# Patient Record
Sex: Male | Born: 1940
Health system: Southern US, Community
[De-identification: ages and names within clinical notes are randomized; demographics above are authoritative.]

## PROBLEM LIST (undated history)

## (undated) DIAGNOSIS — R51 Headache: Secondary | ICD-10-CM

## (undated) DIAGNOSIS — U071 COVID-19: Secondary | ICD-10-CM

## (undated) DIAGNOSIS — I499 Cardiac arrhythmia, unspecified: Secondary | ICD-10-CM

## (undated) DIAGNOSIS — I5030 Unspecified diastolic (congestive) heart failure: Secondary | ICD-10-CM

## (undated) DIAGNOSIS — F0390 Unspecified dementia without behavioral disturbance: Secondary | ICD-10-CM

## (undated) DIAGNOSIS — I219 Acute myocardial infarction, unspecified: Secondary | ICD-10-CM

## (undated) DIAGNOSIS — M51369 Other intervertebral disc degeneration, lumbar region without mention of lumbar back pain or lower extremity pain: Secondary | ICD-10-CM

## (undated) DIAGNOSIS — M1990 Unspecified osteoarthritis, unspecified site: Secondary | ICD-10-CM

## (undated) DIAGNOSIS — L219 Seborrheic dermatitis, unspecified: Secondary | ICD-10-CM

## (undated) DIAGNOSIS — K219 Gastro-esophageal reflux disease without esophagitis: Secondary | ICD-10-CM

## (undated) DIAGNOSIS — I480 Paroxysmal atrial fibrillation: Secondary | ICD-10-CM

## (undated) DIAGNOSIS — I4891 Unspecified atrial fibrillation: Secondary | ICD-10-CM

## (undated) DIAGNOSIS — D509 Iron deficiency anemia, unspecified: Secondary | ICD-10-CM

## (undated) DIAGNOSIS — IMO0001 Reserved for inherently not codable concepts without codable children: Secondary | ICD-10-CM

## (undated) DIAGNOSIS — C61 Malignant neoplasm of prostate: Secondary | ICD-10-CM

## (undated) DIAGNOSIS — E785 Hyperlipidemia, unspecified: Secondary | ICD-10-CM

## (undated) DIAGNOSIS — Z87442 Personal history of urinary calculi: Secondary | ICD-10-CM

## (undated) DIAGNOSIS — I700 Atherosclerosis of aorta: Secondary | ICD-10-CM

## (undated) DIAGNOSIS — M5136 Other intervertebral disc degeneration, lumbar region: Secondary | ICD-10-CM

## (undated) DIAGNOSIS — L570 Actinic keratosis: Secondary | ICD-10-CM

## (undated) DIAGNOSIS — I2510 Atherosclerotic heart disease of native coronary artery without angina pectoris: Secondary | ICD-10-CM

## (undated) DIAGNOSIS — D1779 Benign lipomatous neoplasm of other sites: Secondary | ICD-10-CM

## (undated) DIAGNOSIS — K5790 Diverticulosis of intestine, part unspecified, without perforation or abscess without bleeding: Secondary | ICD-10-CM

## (undated) DIAGNOSIS — R519 Headache, unspecified: Secondary | ICD-10-CM

## (undated) DIAGNOSIS — I10 Essential (primary) hypertension: Secondary | ICD-10-CM

## (undated) DIAGNOSIS — Z8719 Personal history of other diseases of the digestive system: Secondary | ICD-10-CM

## (undated) DIAGNOSIS — M4306 Spondylolysis, lumbar region: Secondary | ICD-10-CM

## (undated) HISTORY — PX: JOINT REPLACEMENT: SHX530

## (undated) HISTORY — DX: Unspecified osteoarthritis, unspecified site: M19.90

## (undated) HISTORY — DX: Actinic keratosis: L57.0

## (undated) HISTORY — PX: OTHER SURGICAL HISTORY: SHX169

## (undated) HISTORY — PX: CARDIAC CATHETERIZATION: SHX172

## (undated) HISTORY — DX: Seborrheic dermatitis, unspecified: L21.9

---

## 2006-02-02 ENCOUNTER — Ambulatory Visit: Admission: RE | Admit: 2006-02-02 | Discharge: 2006-03-24 | Payer: Self-pay | Admitting: Radiation Oncology

## 2006-02-08 ENCOUNTER — Ambulatory Visit (HOSPITAL_COMMUNITY): Admission: RE | Admit: 2006-02-08 | Discharge: 2006-02-08 | Payer: Self-pay | Admitting: Urology

## 2006-05-05 ENCOUNTER — Ambulatory Visit: Admission: RE | Admit: 2006-05-05 | Discharge: 2006-08-03 | Payer: Self-pay | Admitting: Radiation Oncology

## 2006-08-26 ENCOUNTER — Ambulatory Visit: Payer: Self-pay | Admitting: Unknown Physician Specialty

## 2007-01-11 ENCOUNTER — Other Ambulatory Visit: Payer: Self-pay

## 2007-01-11 ENCOUNTER — Ambulatory Visit: Payer: Self-pay | Admitting: Urology

## 2007-01-25 ENCOUNTER — Ambulatory Visit: Payer: Self-pay | Admitting: Urology

## 2007-02-15 ENCOUNTER — Ambulatory Visit: Payer: Self-pay | Admitting: Urology

## 2009-07-03 DIAGNOSIS — I214 Non-ST elevation (NSTEMI) myocardial infarction: Secondary | ICD-10-CM

## 2009-07-03 HISTORY — DX: Non-ST elevation (NSTEMI) myocardial infarction: I21.4

## 2009-07-04 ENCOUNTER — Inpatient Hospital Stay: Payer: Self-pay | Admitting: Internal Medicine

## 2010-07-21 ENCOUNTER — Encounter
Admission: RE | Admit: 2010-07-21 | Discharge: 2010-07-21 | Payer: Self-pay | Source: Home / Self Care | Attending: Internal Medicine | Admitting: Internal Medicine

## 2010-08-06 ENCOUNTER — Encounter
Admission: RE | Admit: 2010-08-06 | Discharge: 2010-08-06 | Payer: Self-pay | Source: Home / Self Care | Attending: Internal Medicine | Admitting: Internal Medicine

## 2011-07-16 ENCOUNTER — Other Ambulatory Visit: Payer: Self-pay | Admitting: Internal Medicine

## 2011-07-16 DIAGNOSIS — N281 Cyst of kidney, acquired: Secondary | ICD-10-CM

## 2011-07-20 ENCOUNTER — Ambulatory Visit
Admission: RE | Admit: 2011-07-20 | Discharge: 2011-07-20 | Disposition: A | Discharge status: Home / Self Care | Payer: Medicare Other | Source: Ambulatory Visit | Attending: Internal Medicine | Admitting: Internal Medicine

## 2011-07-20 DIAGNOSIS — N281 Cyst of kidney, acquired: Secondary | ICD-10-CM

## 2011-12-22 ENCOUNTER — Ambulatory Visit: Payer: Self-pay | Admitting: Urology

## 2011-12-24 ENCOUNTER — Ambulatory Visit: Payer: Self-pay | Admitting: Urology

## 2012-03-30 DIAGNOSIS — C61 Malignant neoplasm of prostate: Secondary | ICD-10-CM | POA: Insufficient documentation

## 2012-03-30 DIAGNOSIS — N529 Male erectile dysfunction, unspecified: Secondary | ICD-10-CM | POA: Insufficient documentation

## 2012-03-30 DIAGNOSIS — N200 Calculus of kidney: Secondary | ICD-10-CM | POA: Insufficient documentation

## 2012-03-30 DIAGNOSIS — K4090 Unilateral inguinal hernia, without obstruction or gangrene, not specified as recurrent: Secondary | ICD-10-CM | POA: Insufficient documentation

## 2012-03-30 DIAGNOSIS — R339 Retention of urine, unspecified: Secondary | ICD-10-CM | POA: Insufficient documentation

## 2012-08-17 DIAGNOSIS — N393 Stress incontinence (female) (male): Secondary | ICD-10-CM | POA: Insufficient documentation

## 2012-12-24 ENCOUNTER — Emergency Department: Payer: Self-pay | Admitting: Emergency Medicine

## 2015-03-08 ENCOUNTER — Encounter (HOSPITAL_COMMUNITY): Admission: EM | Disposition: A | Discharge status: Home / Self Care | Payer: Self-pay | Source: Home / Self Care

## 2015-03-08 ENCOUNTER — Inpatient Hospital Stay (HOSPITAL_COMMUNITY)
Admission: EM | Admit: 2015-03-08 | Discharge: 2015-03-14 | DRG: 354 | Disposition: A | Discharge status: Home / Self Care | Payer: PPO | Attending: Surgery | Admitting: Surgery

## 2015-03-08 ENCOUNTER — Inpatient Hospital Stay (HOSPITAL_COMMUNITY): Payer: PPO | Admitting: Anesthesiology

## 2015-03-08 ENCOUNTER — Encounter (HOSPITAL_COMMUNITY): Payer: Self-pay | Admitting: Emergency Medicine

## 2015-03-08 ENCOUNTER — Emergency Department (HOSPITAL_COMMUNITY): Payer: PPO

## 2015-03-08 DIAGNOSIS — I248 Other forms of acute ischemic heart disease: Secondary | ICD-10-CM | POA: Diagnosis not present

## 2015-03-08 DIAGNOSIS — Z7901 Long term (current) use of anticoagulants: Secondary | ICD-10-CM | POA: Insufficient documentation

## 2015-03-08 DIAGNOSIS — I10 Essential (primary) hypertension: Secondary | ICD-10-CM | POA: Diagnosis present

## 2015-03-08 DIAGNOSIS — I480 Paroxysmal atrial fibrillation: Secondary | ICD-10-CM | POA: Insufficient documentation

## 2015-03-08 DIAGNOSIS — I4891 Unspecified atrial fibrillation: Secondary | ICD-10-CM | POA: Diagnosis not present

## 2015-03-08 DIAGNOSIS — I2510 Atherosclerotic heart disease of native coronary artery without angina pectoris: Secondary | ICD-10-CM | POA: Insufficient documentation

## 2015-03-08 DIAGNOSIS — R7989 Other specified abnormal findings of blood chemistry: Secondary | ICD-10-CM | POA: Insufficient documentation

## 2015-03-08 DIAGNOSIS — K91840 Postprocedural hemorrhage and hematoma of a digestive system organ or structure following a digestive system procedure: Secondary | ICD-10-CM | POA: Diagnosis not present

## 2015-03-08 DIAGNOSIS — Z7902 Long term (current) use of antithrombotics/antiplatelets: Secondary | ICD-10-CM | POA: Diagnosis not present

## 2015-03-08 DIAGNOSIS — K429 Umbilical hernia without obstruction or gangrene: Secondary | ICD-10-CM | POA: Diagnosis present

## 2015-03-08 DIAGNOSIS — I340 Nonrheumatic mitral (valve) insufficiency: Secondary | ICD-10-CM | POA: Diagnosis not present

## 2015-03-08 DIAGNOSIS — Y838 Other surgical procedures as the cause of abnormal reaction of the patient, or of later complication, without mention of misadventure at the time of the procedure: Secondary | ICD-10-CM | POA: Diagnosis not present

## 2015-03-08 DIAGNOSIS — K56609 Unspecified intestinal obstruction, unspecified as to partial versus complete obstruction: Secondary | ICD-10-CM | POA: Diagnosis present

## 2015-03-08 DIAGNOSIS — K436 Other and unspecified ventral hernia with obstruction, without gangrene: Secondary | ICD-10-CM | POA: Diagnosis present

## 2015-03-08 DIAGNOSIS — Z955 Presence of coronary angioplasty implant and graft: Secondary | ICD-10-CM | POA: Diagnosis not present

## 2015-03-08 DIAGNOSIS — R1110 Vomiting, unspecified: Secondary | ICD-10-CM | POA: Diagnosis present

## 2015-03-08 DIAGNOSIS — R778 Other specified abnormalities of plasma proteins: Secondary | ICD-10-CM | POA: Insufficient documentation

## 2015-03-08 HISTORY — PX: LYSIS OF ADHESION: SHX5961

## 2015-03-08 HISTORY — PX: OMENTECTOMY: SHX5985

## 2015-03-08 HISTORY — PX: UMBILICAL HERNIA REPAIR: SHX196

## 2015-03-08 HISTORY — PX: LAPAROTOMY: SHX154

## 2015-03-08 HISTORY — PX: VENTRAL HERNIA REPAIR: SHX424

## 2015-03-08 HISTORY — DX: Essential (primary) hypertension: I10

## 2015-03-08 LAB — CBC WITH DIFFERENTIAL/PLATELET
Basophils Absolute: 0 10*3/uL (ref 0.0–0.1)
Basophils Relative: 0 % (ref 0–1)
Eosinophils Absolute: 0 10*3/uL (ref 0.0–0.7)
Eosinophils Relative: 0 % (ref 0–5)
HCT: 46.1 % (ref 39.0–52.0)
Hemoglobin: 15.8 g/dL (ref 13.0–17.0)
Lymphocytes Relative: 7 % — ABNORMAL LOW (ref 12–46)
Lymphs Abs: 0.7 10*3/uL (ref 0.7–4.0)
MCH: 30.7 pg (ref 26.0–34.0)
MCHC: 34.3 g/dL (ref 30.0–36.0)
MCV: 89.7 fL (ref 78.0–100.0)
Monocytes Absolute: 0.6 10*3/uL (ref 0.1–1.0)
Monocytes Relative: 5 % (ref 3–12)
Neutro Abs: 9.7 10*3/uL — ABNORMAL HIGH (ref 1.7–7.7)
Neutrophils Relative %: 88 % — ABNORMAL HIGH (ref 43–77)
Platelets: 199 10*3/uL (ref 150–400)
RBC: 5.14 MIL/uL (ref 4.22–5.81)
RDW: 13.6 % (ref 11.5–15.5)
WBC: 11.1 10*3/uL — ABNORMAL HIGH (ref 4.0–10.5)

## 2015-03-08 LAB — URINALYSIS, ROUTINE W REFLEX MICROSCOPIC
Bilirubin Urine: NEGATIVE
Glucose, UA: NEGATIVE mg/dL
Ketones, ur: NEGATIVE mg/dL
Leukocytes, UA: NEGATIVE
Nitrite: NEGATIVE
Protein, ur: NEGATIVE mg/dL
Specific Gravity, Urine: 1.014 (ref 1.005–1.030)
Urobilinogen, UA: 1 mg/dL (ref 0.0–1.0)
pH: 7 (ref 5.0–8.0)

## 2015-03-08 LAB — COMPREHENSIVE METABOLIC PANEL
ALT: 24 U/L (ref 17–63)
AST: 25 U/L (ref 15–41)
Albumin: 4 g/dL (ref 3.5–5.0)
Alkaline Phosphatase: 56 U/L (ref 38–126)
Anion gap: 14 (ref 5–15)
BUN: 12 mg/dL (ref 6–20)
CO2: 29 mmol/L (ref 22–32)
Calcium: 10.1 mg/dL (ref 8.9–10.3)
Chloride: 98 mmol/L — ABNORMAL LOW (ref 101–111)
Creatinine, Ser: 1.38 mg/dL — ABNORMAL HIGH (ref 0.61–1.24)
GFR calc Af Amer: 57 mL/min — ABNORMAL LOW (ref >60)
GFR calc non Af Amer: 50 mL/min — ABNORMAL LOW (ref >60)
Glucose, Bld: 157 mg/dL — ABNORMAL HIGH (ref 65–99)
Potassium: 3.4 mmol/L — ABNORMAL LOW (ref 3.5–5.1)
Sodium: 141 mmol/L (ref 135–145)
Total Bilirubin: 0.8 mg/dL (ref 0.3–1.2)
Total Protein: 7.3 g/dL (ref 6.5–8.1)

## 2015-03-08 LAB — URINE MICROSCOPIC-ADD ON

## 2015-03-08 LAB — TYPE AND SCREEN
ABO/RH(D): B POS
Antibody Screen: NEGATIVE

## 2015-03-08 LAB — PROTIME-INR
INR: 1.2 (ref 0.00–1.49)
Prothrombin Time: 15.4 seconds — ABNORMAL HIGH (ref 11.6–15.2)

## 2015-03-08 LAB — POC OCCULT BLOOD, ED: Fecal Occult Bld: NEGATIVE

## 2015-03-08 LAB — LIPASE, BLOOD: Lipase: 34 U/L (ref 22–51)

## 2015-03-08 LAB — ABO/RH: ABO/RH(D): B POS

## 2015-03-08 LAB — APTT: aPTT: 25 seconds (ref 24–37)

## 2015-03-08 LAB — I-STAT CG4 LACTIC ACID, ED: Lactic Acid, Venous: 1.47 mmol/L (ref 0.5–2.0)

## 2015-03-08 SURGERY — LAPAROTOMY, EXPLORATORY
Anesthesia: General | Site: Abdomen

## 2015-03-08 MED ORDER — SODIUM CHLORIDE 0.9 % IV SOLN
Freq: Once | INTRAVENOUS | Status: AC
  Administered 2015-03-08: 19:00:00 via INTRAVENOUS

## 2015-03-08 MED ORDER — FENTANYL CITRATE (PF) 250 MCG/5ML IJ SOLN
INTRAMUSCULAR | Status: AC
  Filled 2015-03-08: qty 5

## 2015-03-08 MED ORDER — ONDANSETRON HCL 4 MG/2ML IJ SOLN
4.0000 mg | Freq: Four times a day (QID) | INTRAMUSCULAR | Status: DC | PRN
  Administered 2015-03-08: 4 mg via INTRAVENOUS

## 2015-03-08 MED ORDER — FENTANYL CITRATE (PF) 100 MCG/2ML IJ SOLN
INTRAMUSCULAR | Status: DC | PRN
  Administered 2015-03-08: 50 ug via INTRAVENOUS
  Administered 2015-03-08: 100 ug via INTRAVENOUS
  Administered 2015-03-08 (×2): 50 ug via INTRAVENOUS

## 2015-03-08 MED ORDER — NOREPINEPHRINE BITARTRATE 1 MG/ML IV SOLN: 4000.0000 ug | INTRAVENOUS | Status: DC | PRN

## 2015-03-08 MED ORDER — PROMETHAZINE HCL 25 MG/ML IJ SOLN: 6.2500 mg | INTRAMUSCULAR | Status: DC | PRN

## 2015-03-08 MED ORDER — SODIUM CHLORIDE 0.9 % IV SOLN
Freq: Once | INTRAVENOUS | Status: AC
  Administered 2015-03-08: 20:00:00 via INTRAVENOUS

## 2015-03-08 MED ORDER — EPHEDRINE SULFATE 50 MG/ML IJ SOLN
INTRAMUSCULAR | Status: AC
Start: 2015-03-08 — End: 2015-03-08
  Filled 2015-03-08: qty 1

## 2015-03-08 MED ORDER — ONDANSETRON 4 MG PO TBDP
4.0000 mg | ORAL_TABLET | Freq: Four times a day (QID) | ORAL | Status: DC | PRN
  Filled 2015-03-08: qty 1

## 2015-03-08 MED ORDER — ONDANSETRON HCL 4 MG/2ML IJ SOLN
INTRAMUSCULAR | Status: AC
  Filled 2015-03-08: qty 2

## 2015-03-08 MED ORDER — LACTATED RINGERS IV SOLN
INTRAVENOUS | Status: DC | PRN
  Administered 2015-03-08 (×3): via INTRAVENOUS

## 2015-03-08 MED ORDER — KCL IN DEXTROSE-NACL 20-5-0.9 MEQ/L-%-% IV SOLN
INTRAVENOUS | Status: DC
  Administered 2015-03-08 – 2015-03-14 (×9): via INTRAVENOUS
  Filled 2015-03-08 (×11): qty 1000

## 2015-03-08 MED ORDER — PANTOPRAZOLE SODIUM 40 MG IV SOLR
40.0000 mg | Freq: Every day | INTRAVENOUS | Status: DC
  Administered 2015-03-09 – 2015-03-13 (×5): 40 mg via INTRAVENOUS
  Filled 2015-03-08 (×7): qty 40

## 2015-03-08 MED ORDER — PROPOFOL 10 MG/ML IV BOLUS
INTRAVENOUS | Status: DC | PRN
  Administered 2015-03-08: 150 mg via INTRAVENOUS
  Administered 2015-03-08: 50 mg via INTRAVENOUS

## 2015-03-08 MED ORDER — LIDOCAINE HCL (CARDIAC) 20 MG/ML IV SOLN
INTRAVENOUS | Status: DC | PRN
  Administered 2015-03-08: 100 mg via INTRAVENOUS

## 2015-03-08 MED ORDER — HYDRALAZINE HCL 20 MG/ML IJ SOLN: 10.0000 mg | INTRAMUSCULAR | Status: DC | PRN

## 2015-03-08 MED ORDER — 0.9 % SODIUM CHLORIDE (POUR BTL) OPTIME
TOPICAL | Status: DC | PRN
  Administered 2015-03-08 (×2): 1000 mL

## 2015-03-08 MED ORDER — PHENYLEPHRINE HCL 10 MG/ML IJ SOLN
10.0000 mg | INTRAMUSCULAR | Status: DC | PRN
  Administered 2015-03-08: 25 ug/min via INTRAVENOUS

## 2015-03-08 MED ORDER — GLYCOPYRROLATE 0.2 MG/ML IJ SOLN
INTRAMUSCULAR | Status: AC
  Filled 2015-03-08: qty 2

## 2015-03-08 MED ORDER — HYDROMORPHONE HCL 1 MG/ML IJ SOLN: 0.2500 mg | INTRAMUSCULAR | Status: DC | PRN

## 2015-03-08 MED ORDER — ROCURONIUM BROMIDE 100 MG/10ML IV SOLN
INTRAVENOUS | Status: DC | PRN
  Administered 2015-03-08: 10 mg via INTRAVENOUS
  Administered 2015-03-08: 40 mg via INTRAVENOUS

## 2015-03-08 MED ORDER — ONDANSETRON HCL 4 MG/2ML IJ SOLN
4.0000 mg | Freq: Once | INTRAMUSCULAR | Status: DC
Start: 2015-03-08 — End: 2015-03-14
  Filled 2015-03-08: qty 2

## 2015-03-08 MED ORDER — SUCCINYLCHOLINE CHLORIDE 200 MG/10ML IV SOSY
PREFILLED_SYRINGE | INTRAVENOUS | Status: DC | PRN
  Administered 2015-03-08: 100 mg via INTRAVENOUS

## 2015-03-08 MED ORDER — NEOSTIGMINE METHYLSULFATE 10 MG/10ML IV SOLN
INTRAVENOUS | Status: AC
  Filled 2015-03-08: qty 1

## 2015-03-08 MED ORDER — NEOSTIGMINE METHYLSULFATE 10 MG/10ML IV SOLN
INTRAVENOUS | Status: DC | PRN
  Administered 2015-03-08: 3 mg via INTRAVENOUS

## 2015-03-08 MED ORDER — SUCCINYLCHOLINE CHLORIDE 20 MG/ML IJ SOLN
INTRAMUSCULAR | Status: AC
  Filled 2015-03-08: qty 1

## 2015-03-08 MED ORDER — MORPHINE SULFATE 2 MG/ML IJ SOLN
1.0000 mg | INTRAMUSCULAR | Status: DC | PRN
  Administered 2015-03-08 – 2015-03-09 (×2): 2 mg via INTRAVENOUS
  Administered 2015-03-09: 4 mg via INTRAVENOUS
  Administered 2015-03-09 – 2015-03-10 (×4): 2 mg via INTRAVENOUS
  Filled 2015-03-08 (×3): qty 1
  Filled 2015-03-08: qty 2
  Filled 2015-03-08 (×3): qty 1

## 2015-03-08 MED ORDER — ROCURONIUM BROMIDE 50 MG/5ML IV SOLN
INTRAVENOUS | Status: AC
  Filled 2015-03-08: qty 1

## 2015-03-08 MED ORDER — GLYCOPYRROLATE 0.2 MG/ML IJ SOLN
INTRAMUSCULAR | Status: DC | PRN
  Administered 2015-03-08: 0.4 mg via INTRAVENOUS

## 2015-03-08 MED ORDER — CEFAZOLIN SODIUM-DEXTROSE 2-3 GM-% IV SOLR
2.0000 g | Freq: Once | INTRAVENOUS | Status: AC
  Administered 2015-03-08: 2 g via INTRAVENOUS
  Filled 2015-03-08: qty 50

## 2015-03-08 SURGICAL SUPPLY — 54 items
BIOPATCH WHT 1IN DISK W/4.0 H (GAUZE/BANDAGES/DRESSINGS) ×4 IMPLANT
BLADE SURG ROTATE 9660 (MISCELLANEOUS) ×4 IMPLANT
CANISTER SUCTION 2500CC (MISCELLANEOUS) ×4 IMPLANT
CHLORAPREP W/TINT 26ML (MISCELLANEOUS) ×4 IMPLANT
COVER MAYO STAND STRL (DRAPES) IMPLANT
COVER SURGICAL LIGHT HANDLE (MISCELLANEOUS) ×4 IMPLANT
DRAIN CHANNEL 19F RND (DRAIN) ×4 IMPLANT
DRAPE LAPAROSCOPIC ABDOMINAL (DRAPES) ×4 IMPLANT
DRAPE PROXIMA HALF (DRAPES) ×4 IMPLANT
DRAPE UTILITY XL STRL (DRAPES) IMPLANT
DRAPE WARM FLUID 44X44 (DRAPE) ×4 IMPLANT
DRSG OPSITE POSTOP 4X10 (GAUZE/BANDAGES/DRESSINGS) IMPLANT
DRSG OPSITE POSTOP 4X8 (GAUZE/BANDAGES/DRESSINGS) ×4 IMPLANT
ELECT BLADE 6.5 EXT (BLADE) ×4 IMPLANT
ELECT CAUTERY BLADE 6.4 (BLADE) ×4 IMPLANT
ELECT REM PT RETURN 9FT ADLT (ELECTROSURGICAL)
ELECTRODE REM PT RTRN 9FT ADLT (ELECTROSURGICAL) IMPLANT
EVACUATOR SILICONE 100CC (DRAIN) ×4 IMPLANT
GLOVE BIO SURGEON STRL SZ7.5 (GLOVE) ×8 IMPLANT
GLOVE BIOGEL PI IND STRL 6.5 (GLOVE) ×4 IMPLANT
GLOVE BIOGEL PI IND STRL 7.0 (GLOVE) ×6 IMPLANT
GLOVE BIOGEL PI INDICATOR 6.5 (GLOVE) ×4
GLOVE BIOGEL PI INDICATOR 7.0 (GLOVE) ×6
GLOVE BIOGEL PI ORTHO PRO SZ7 (GLOVE) ×4
GLOVE PI ORTHO PRO STRL SZ7 (GLOVE) ×4 IMPLANT
GLOVE SURG SS PI 6.5 STRL IVOR (GLOVE) ×8 IMPLANT
GOWN STRL REUS W/ TWL LRG LVL3 (GOWN DISPOSABLE) ×6 IMPLANT
GOWN STRL REUS W/TWL LRG LVL3 (GOWN DISPOSABLE) ×6
KIT BASIN OR (CUSTOM PROCEDURE TRAY) ×4 IMPLANT
KIT ROOM TURNOVER OR (KITS) ×4 IMPLANT
LIGASURE IMPACT 36 18CM CVD LR (INSTRUMENTS) ×4 IMPLANT
NS IRRIG 1000ML POUR BTL (IV SOLUTION) ×8 IMPLANT
PACK GENERAL/GYN (CUSTOM PROCEDURE TRAY) ×4 IMPLANT
PAD ARMBOARD 7.5X6 YLW CONV (MISCELLANEOUS) ×4 IMPLANT
PENCIL BUTTON HOLSTER BLD 10FT (ELECTRODE) IMPLANT
SPECIMEN JAR LARGE (MISCELLANEOUS) IMPLANT
SPONGE GAUZE 4X4 12PLY STER LF (GAUZE/BANDAGES/DRESSINGS) ×4 IMPLANT
SPONGE LAP 18X18 X RAY DECT (DISPOSABLE) IMPLANT
STAPLER VISISTAT 35W (STAPLE) ×4 IMPLANT
SUCTION POOLE TIP (SUCTIONS) ×4 IMPLANT
SUT ETHILON 3 0 FSL (SUTURE) ×4 IMPLANT
SUT NOVA 1 T20/GS 25DT (SUTURE) ×8 IMPLANT
SUT PDS AB 1 TP1 96 (SUTURE) ×8 IMPLANT
SUT SILK 2 0 SH CR/8 (SUTURE) ×4 IMPLANT
SUT SILK 2 0 TIES 10X30 (SUTURE) ×4 IMPLANT
SUT SILK 3 0 SH CR/8 (SUTURE) ×4 IMPLANT
SUT SILK 3 0 TIES 10X30 (SUTURE) ×4 IMPLANT
SUT VIC AB 3-0 SH 18 (SUTURE) IMPLANT
TAPE CLOTH SURG 4X10 WHT LF (GAUZE/BANDAGES/DRESSINGS) ×4 IMPLANT
TOWEL OR 17X26 10 PK STRL BLUE (TOWEL DISPOSABLE) ×4 IMPLANT
TRAY FOLEY CATH 16FRSI W/METER (SET/KITS/TRAYS/PACK) ×4 IMPLANT
TUBE CONNECTING 12'X1/4 (SUCTIONS)
TUBE CONNECTING 12X1/4 (SUCTIONS) IMPLANT
YANKAUER SUCT BULB TIP NO VENT (SUCTIONS) ×4 IMPLANT

## 2015-03-08 NOTE — H&P (Signed)
Jim Dodson is an 74 y.o. male.   Chief Complaint: vomiting HPI:  The patient is a 74 year old white male who presents to the emergency department with 3 days of nausea and vomiting. For the last 3 days he has noticed a firm lump just to the right of his midline incision. He has a known ventral hernia but the lump that he is feeling is new. He denies any fevers or chills. He has been passing some flatus. He has a history of previous ventral hernia repair. He also has a history of congestive heart failure and coronary artery disease with a stent that was placed in his heart in 2010. He is on Plavix. He underwent a CT scan which does show a loop of small bowel incarcerated through a hernia of the abdominal wall which is also the site of obstruction  Past Medical History  Diagnosis Date  . Hypertension     History reviewed. No pertinent past surgical history.  History reviewed. No pertinent family history. Social History:  reports that he has never smoked. He does not have any smokeless tobacco history on file. He reports that he does not drink alcohol or use illicit drugs.  Allergies:  Allergies  Allergen Reactions  . Statins      (Not in a hospital admission)  Results for orders placed or performed during the hospital encounter of 03/08/15 (from the past 48 hour(s))  Lipase, blood     Status: None   Collection Time: 03/08/15  3:28 PM  Result Value Ref Range   Lipase 34 22 - 51 U/L  Comprehensive metabolic panel     Status: Abnormal   Collection Time: 03/08/15  3:28 PM  Result Value Ref Range   Sodium 141 135 - 145 mmol/L   Potassium 3.4 (L) 3.5 - 5.1 mmol/L   Chloride 98 (L) 101 - 111 mmol/L   CO2 29 22 - 32 mmol/L   Glucose, Bld 157 (H) 65 - 99 mg/dL   BUN 12 6 - 20 mg/dL   Creatinine, Ser 1.38 (H) 0.61 - 1.24 mg/dL   Calcium 10.1 8.9 - 10.3 mg/dL   Total Protein 7.3 6.5 - 8.1 g/dL   Albumin 4.0 3.5 - 5.0 g/dL   AST 25 15 - 41 U/L   ALT 24 17 - 63 U/L   Alkaline  Phosphatase 56 38 - 126 U/L   Total Bilirubin 0.8 0.3 - 1.2 mg/dL   GFR calc non Af Amer 50 (L) >60 mL/min   GFR calc Af Amer 57 (L) >60 mL/min    Comment: (NOTE) The eGFR has been calculated using the CKD EPI equation. This calculation has not been validated in all clinical situations. eGFR's persistently <60 mL/min signify possible Chronic Kidney Disease.    Anion gap 14 5 - 15  CBC with Differential     Status: Abnormal   Collection Time: 03/08/15  3:28 PM  Result Value Ref Range   WBC 11.1 (H) 4.0 - 10.5 K/uL   RBC 5.14 4.22 - 5.81 MIL/uL   Hemoglobin 15.8 13.0 - 17.0 g/dL   HCT 46.1 39.0 - 52.0 %   MCV 89.7 78.0 - 100.0 fL   MCH 30.7 26.0 - 34.0 pg   MCHC 34.3 30.0 - 36.0 g/dL   RDW 13.6 11.5 - 15.5 %   Platelets 199 150 - 400 K/uL   Neutrophils Relative % 88 (H) 43 - 77 %   Neutro Abs 9.7 (H) 1.7 - 7.7 K/uL  Lymphocytes Relative 7 (L) 12 - 46 %   Lymphs Abs 0.7 0.7 - 4.0 K/uL   Monocytes Relative 5 3 - 12 %   Monocytes Absolute 0.6 0.1 - 1.0 K/uL   Eosinophils Relative 0 0 - 5 %   Eosinophils Absolute 0.0 0.0 - 0.7 K/uL   Basophils Relative 0 0 - 1 %   Basophils Absolute 0.0 0.0 - 0.1 K/uL  POC occult blood, ED Provider will collect     Status: None   Collection Time: 03/08/15  5:56 PM  Result Value Ref Range   Fecal Occult Bld NEGATIVE NEGATIVE  I-Stat CG4 Lactic Acid, ED     Status: None   Collection Time: 03/08/15  6:10 PM  Result Value Ref Range   Lactic Acid, Venous 1.47 0.5 - 2.0 mmol/L   Ct Abdomen Pelvis Wo Contrast  03/08/2015   CLINICAL DATA:  Acute onset of periumbilical abdominal pain. Vomiting and nausea. Initial encounter.  EXAM: CT ABDOMEN AND PELVIS WITHOUT CONTRAST  TECHNIQUE: Multidetector CT imaging of the abdomen and pelvis was performed following the standard protocol without IV contrast.  COMPARISON:  CT of the abdomen and pelvis performed 12/22/2011, and renal ultrasound performed 07/20/2011  FINDINGS: The visualized lung bases are clear.  Scattered coronary artery calcifications are seen.  A tiny calcified granuloma is noted at the left hepatic lobe. A small vague 8 mm hypodensity within the left hepatic lobe may reflect a small cyst, grossly stable from 2013. The spleen is unremarkable in appearance. The gallbladder is within normal limits.  A 2.5 cm fat containing right adrenal adenoma is noted. The left adrenal gland is unremarkable. The pancreas is within normal limits.  Vaguely abnormal contour of the left kidney near its lower pole, with minimal adjacent calcification, appears to reflect a cyst, on correlation with prior CT from 2013. Nonspecific perinephric stranding is noted bilaterally. A few nonobstructing right renal stones are seen, measuring up to 5 mm in size. There is no evidence of hydronephrosis. No obstructing ureteral stones are identified.  Several anterior abdominal wall hernias are noted along the mid abdominal wall, just superior to the umbilicus, with a small umbilical hernia also seen. There is herniation of a short 4 cm segment of proximal ileum into a right-sided anterior abdominal wall hernia, resulting in relatively high-grade small bowel obstruction. There is dilatation of small-bowel loops to 3.8 cm in maximal diameter.  Trace free fluid is seen within the pelvis. There is decompression of all small bowel loops distal to the herniation. Residual stool is noted within the colon. Scattered diverticulosis is noted along the sigmoid colon, without definite evidence of diverticulitis.  The stomach is within normal limits. No acute vascular abnormalities are seen. Relatively diffuse calcification is noted along the abdominal aorta and its branches.  The appendix is decompressed.  There is no evidence of appendicitis.  The bladder is mildly distended and grossly unremarkable. The prostate remains normal in size, with scattered calcification. A moderate left inguinal hernia is seen, containing trace fluid. No inguinal  lymphadenopathy is seen.  No acute osseous abnormalities are identified. Facet disease is noted at the lower lumbar spine. Endplate sclerotic change is seen at T10-T11.  IMPRESSION: 1. Focal herniation of a short 4 cm segment of proximal ileum into a right-sided anterior abdominal wall hernia, resulting in relatively high-grade small bowel obstruction. Dilatation of small-bowel loops to 3.8 cm in maximal diameter, trace free fluid in the pelvis. Decompression of small bowel loops distal to  the herniation. 2. Multiple anterior abdominal wall hernias seen at the mid abdominal wall, just superior to the umbilicus, with a small umbilical hernia also seen. Moderate left inguinal hernia noted, containing trace fluid. 3. Scattered coronary artery calcifications seen. 4. Tiny hepatic cyst. Apparent left renal cyst. Few nonobstructing right renal stones measure up to 5 mm in size. 5. 2.5 cm fat containing right adrenal adenoma noted. 6. Scattered diverticulosis along the sigmoid colon, without evidence of diverticulitis. 7. Relatively diffuse calcification along the abdominal aorta and its branches. These results were called by telephone at the time of interpretation on 03/08/2015 at 6:46 pm to Dr. Ezequiel Essex, who verbally acknowledged these results.   Electronically Signed   By: Garald Balding M.D.   On: 03/08/2015 18:48    Review of Systems  Constitutional: Negative.   HENT: Negative.   Eyes: Negative.   Respiratory: Negative.   Cardiovascular: Negative.   Gastrointestinal: Positive for nausea, vomiting and abdominal pain.  Genitourinary: Negative.   Musculoskeletal: Negative.   Skin: Negative.   Neurological: Negative.   Endo/Heme/Allergies: Bruises/bleeds easily.  Psychiatric/Behavioral: Negative.     Blood pressure 169/102, pulse 91, temperature 97.9 F (36.6 C), temperature source Oral, resp. rate 20, SpO2 94 %. Physical Exam  Constitutional: He is oriented to person, place, and time. He appears  well-developed and well-nourished.  HENT:  Head: Normocephalic and atraumatic.  Eyes: Conjunctivae and EOM are normal. Pupils are equal, round, and reactive to light.  Neck: Normal range of motion. Neck supple.  Cardiovascular: Normal rate, regular rhythm and normal heart sounds.   Respiratory: Effort normal and breath sounds normal.  GI: Soft.  There is a palpable mass on right mid abdomen that does not reduce and has some mild tenderness with palpation  Musculoskeletal: Normal range of motion.  Neurological: He is alert and oriented to person, place, and time.  Skin: Skin is warm and dry.  Psychiatric: He has a normal mood and affect. His behavior is normal.     Assessment/Plan  The patient appears to have an incarcerated ventral hernia with small bowel that is causing an obstruction. Because of the risk of necrosis of this loop of small bowel I think he would benefit from having the hernia repaired. I will plan to do this tonight in the operating room. I have discussed with him in detail the risks and benefits of the operation as well as some of the technical aspects and he understands and wishes to proceed. This includes a significant risk of bleeding since he is on Plavix and he may require blood products.  TOTH III,PAUL S 03/08/2015, 7:15 PM

## 2015-03-08 NOTE — ED Notes (Signed)
Called CT.  Per MD, notified CT that pt needs to go to scan now.  Informed to skip PO contrast and IV.

## 2015-03-08 NOTE — Anesthesia Preprocedure Evaluation (Addendum)
Anesthesia Evaluation  Patient identified by MRN, date of birth, ID band Patient awake    Reviewed: Allergy & Precautions, NPO status , Patient's Chart, lab work & pertinent test results, reviewed documented beta blocker date and time   Airway Mallampati: II  TM Distance: >3 FB Neck ROM: Full    Dental  (+) Partial Lower   Pulmonary neg pulmonary ROS,  breath sounds clear to auscultation        Cardiovascular hypertension, Pt. on medications and Pt. on home beta blockers + CAD, + Cardiac Stents and +CHF Rhythm:Regular Rate:Normal  EF 55% on 2015 Echo.    Neuro/Psych negative neurological ROS     GI/Hepatic Neg liver ROS, Ventral hernia with incarcerated bowel   Endo/Other  negative endocrine ROS  Renal/GU Renal InsufficiencyRenal disease     Musculoskeletal   Abdominal   Peds  Hematology Pt on DAPT last dose of ASA and plavix today.   Anesthesia Other Findings   Reproductive/Obstetrics                            Anesthesia Physical Anesthesia Plan  ASA: III and emergent  Anesthesia Plan: General   Post-op Pain Management:    Induction: Intravenous, Rapid sequence and Cricoid pressure planned  Airway Management Planned: Oral ETT  Additional Equipment:   Intra-op Plan:   Post-operative Plan: Extubation in OR  Informed Consent: I have reviewed the patients History and Physical, chart, labs and discussed the procedure including the risks, benefits and alternatives for the proposed anesthesia with the patient or authorized representative who has indicated his/her understanding and acceptance.   Dental advisory given  Plan Discussed with: CRNA and Surgeon  Anesthesia Plan Comments:        Anesthesia Quick Evaluation

## 2015-03-08 NOTE — Op Note (Signed)
03/08/2015  10:16 PM  PATIENT:  Jim Dodson  74 y.o. male  PRE-OPERATIVE DIAGNOSIS:  Incarcerated ventral hernia  POST-OPERATIVE DIAGNOSIS:  Incarcerated ventral hernia  PROCEDURE:  Procedure(s): EXPLORATORY LAPAROTOMY (N/A)  VENTRAL HERNIA  REPAIR ADULT (N/A) LYSIS OF ADHESION (N/A) OMENTECTOMY (N/A)  Umbilical hernia repair  SURGEON:  Surgeon(s) and Role:    * Jovita Kussmaul, MD - Primary    * Mickeal Skinner, MD - Assisting  PHYSICIAN ASSISTANT:   ASSISTANTS: Dr. Kieth Brightly   ANESTHESIA:   general  EBL:  Total I/O In: 1000 [I.V.:1000] Out: 135 [Urine:85; Blood:50]  BLOOD ADMINISTERED:none  DRAINS: (1) Jackson-Pratt drain(s) with closed bulb suction in the subq   LOCAL MEDICATIONS USED:  NONE  SPECIMEN:  No Specimen  DISPOSITION OF SPECIMEN:  N/A  COUNTS:  YES  TOURNIQUET:  * No tourniquets in log *  DICTATION: .Dragon Dictation  After informed consent was obtained the patient was brought to the operating room and placed in the supine position on the operating room table. After adequate induction of general anesthesia the patient's abdomen was prepped with ChloraPrep, allowed to dry, and draped in usual sterile manner. An upper midline incision was made with a 10 blade knife. This incision was carried through the skin and subcutaneous tissue sharply with the electrocautery until the linea alba was identified. The linea alba was incised with the electrocautery and the preperitoneal space was probed bluntly until the peritoneum was opened and access was gained to the abdominal cavity. The hernias were identified and initially the hernia sacs were separated sharply from the rest of the subcutaneous tissue using the electrocautery. The fascial edge at each of the hernia sacs was opened sharply with the electrocautery and Metzenbaum scissors. The upper hernia sacs contained omental fat. The lower right hernia sac contained a loop of small bowel. The fascia at the edge  of this hernia was opened sharply with Metzenbaum scissors. The bowel was reduced. The bowel had a hemorrhagic appearance. I elected to watch the bowel for several minutes. During this time we freed the omentum from the hernia sac sharply with the electrocautery. We palpated the abdominal wall and there was also an umbilical hernia. The fascia was opened sharply with electrocautery through this hernia and the sac was removed. The hernia sacs for each of the hernias was excised sharply with the electrocautery. Once this was accomplished we identified the healthy edge of the fascia and cleaned the fascial edges of any questionable tissue. The bowel was then inspected again and it had pinked up nicely. I decided not to resect the loop of bowel. Some of the omentum did have to be resected in order to get hemostasis and this was done sharply with the LigaSure. Once this was accomplished the area appeared to be hemostatic. The subcutaneous tissue was dissected away from the anterior abdominal wall fascia in order for the fascia to mobilize enough to reach at the midline. The fascia was then closed with interrupted #1 figure-of-eight Novafil stitches. The subcutaneous tissue was irrigated with saline. A small stab incision was made in the right lower abdomen and a tonsil clamp was placed through this incision into the subcutaneous tissue. A 19 French round Blake drain was brought into the operative bed and out the stab incision. The drain was anchored to the skin with a 3-0 nylon stitch. The skin was then closed with staples and the drain was placed to bulb suction and there was a good seal. Sterile  dressings were then applied. The patient tolerated the procedure well. At the end of the case all needle sponge and instrument counts were correct. The patient was then awakened and taken to recovery in stable condition.  PLAN OF CARE: Admit to inpatient   PATIENT DISPOSITION:  PACU - hemodynamically stable.   Delay start  of Pharmacological VTE agent (>24hrs) due to surgical blood loss or risk of bleeding: no

## 2015-03-08 NOTE — Anesthesia Procedure Notes (Signed)
Procedure Name: Intubation Date/Time: 03/08/2015 8:25 PM Performed by: Manus Gunning, Lechelle Wrigley J Pre-anesthesia Checklist: Patient identified, Timeout performed, Emergency Drugs available, Suction available and Patient being monitored Patient Re-evaluated:Patient Re-evaluated prior to inductionOxygen Delivery Method: Circle system utilized Preoxygenation: Pre-oxygenation with 100% oxygen Intubation Type: IV induction Ventilation: Mask ventilation without difficulty Laryngoscope Size: Mac and 4 Grade View: Grade II Tube type: Oral Tube size: 8.0 mm Number of attempts: 1 Placement Confirmation: ETT inserted through vocal cords under direct vision and breath sounds checked- equal and bilateral Secured at: 21 cm Tube secured with: Tape Dental Injury: Teeth and Oropharynx as per pre-operative assessment

## 2015-03-08 NOTE — Transfer of Care (Signed)
Immediate Anesthesia Transfer of Care Note  Patient: Jim Dodson  Procedure(s) Performed: Procedure(s): EXPLORATORY LAPAROTOMY (N/A)  VENTRAL HERNIA  REPAIR ADULT (N/A) LYSIS OF ADHESION (N/A) OMENTECTOMY (N/A) UMBILICAL HERNIA REPAIR  ADULT  Patient Location: PACU  Anesthesia Type:General  Level of Consciousness: awake  Airway & Oxygen Therapy: Patient Spontanous Breathing and Patient connected to nasal cannula oxygen  Post-op Assessment: Report given to RN and Post -op Vital signs reviewed and stable  Post vital signs: Reviewed and stable  Last Vitals:  Filed Vitals:   03/08/15 2224  BP: 157/96  Pulse: 95  Temp: 36.8 C  Resp: 13    Complications: No apparent anesthesia complications

## 2015-03-08 NOTE — ED Provider Notes (Signed)
CSN: [id number]     Arrival date & time 03/08/15  1458 History   First MD Initiated Contact with Patient 03/08/15 1731     Chief Complaint  Patient presents with  . Abdominal Pain  . Emesis     (Consider location/radiation/quality/duration/timing/severity/associated sxs/prior Treatment) HPI Comments: Three-day history of lower abdominal pain at site of previous ventral hernia repair. Pain is constant today worse with palpation. Patient reports intermittent vomiting daily for the past 3 days. He reports emesis is "dark" in color. Denies seeing any blood. No blood in his stools. Denies any fever. Denies any chest pain or shortness of breath. Feels a bulge in his abdomen is not usually there. No testicular tenderness. No urinary symptoms. Feels abdominal cramping due to discomfort in his abdomen. Still passing gas from below. Denies fever. Reports ventral hernia repair greater than 25 years ago.  Patient is a 74 y.o. male presenting with abdominal pain and vomiting. The history is provided by the patient and a relative.  Abdominal Pain Associated symptoms: nausea and vomiting   Associated symptoms: no chest pain, no cough, no diarrhea, no dysuria, no fever, no hematuria and no shortness of breath   Emesis Associated symptoms: abdominal pain   Associated symptoms: no arthralgias, no diarrhea, no headaches and no myalgias     Past Medical History  Diagnosis Date  . Hypertension    History reviewed. No pertinent past surgical history. History reviewed. No pertinent family history. History  Substance Use Topics  . Smoking status: Never Smoker   . Smokeless tobacco: Not on file  . Alcohol Use: No    Review of Systems  Constitutional: Positive for activity change and appetite change. Negative for fever.  HENT: Negative for congestion and rhinorrhea.   Respiratory: Negative for cough, chest tightness and shortness of breath.   Cardiovascular: Negative for chest pain.  Gastrointestinal:  Positive for nausea, vomiting and abdominal pain. Negative for diarrhea and blood in stool.  Genitourinary: Negative for dysuria, urgency, hematuria and testicular pain.  Musculoskeletal: Negative for myalgias and arthralgias.  Skin: Negative for rash.  Neurological: Negative for dizziness, weakness and headaches.  A complete 10 system review of systems was obtained and all systems are negative except as noted in the HPI and PMH.      Allergies  Statins  Home Medications   Prior to Admission medications   Medication Sig Start Date End Date Taking? Authorizing Provider  aspirin EC 81 MG tablet Take 81 mg by mouth daily at 12 noon.   Yes Historical Provider, MD  carvedilol (COREG) 12.5 MG tablet Take 12.5 mg by mouth 2 (two) times daily. 07/30/14  Yes Historical Provider, MD  clopidogrel (PLAVIX) 75 MG tablet Take 75 mg by mouth daily at 12 noon. 04/12/14  Yes Historical Provider, MD  Hyaluronic Acid-Vitamin C (HYALURONIC ACID PO) Take 100 mg by mouth daily at 12 noon.   Yes Historical Provider, MD  lisinopril-hydrochlorothiazide (PRINZIDE,ZESTORETIC) 20-25 MG per tablet Take 1 tablet by mouth daily at 12 noon. 12/26/13  Yes Historical Provider, MD  MAGNESIUM-POTASSIUM PO Take 1 tablet by mouth daily at 12 noon.   Yes Historical Provider, MD  meclizine (ANTIVERT) 12.5 MG tablet Take 12.5 mg by mouth 3 (three) times daily as needed for dizziness.   Yes Historical Provider, MD  Nutritional Supplements (NUTRITIONAL SUPPLEMENT PO) Take 6 tablets by mouth 2 (two) times daily. [place]: 40% ketosterones/ 20% 3-ketosterone   Yes Historical Provider, MD  Red Yeast Rice Extract 600 MG  CAPS Take 1,200 mg by mouth 2 (two) times daily.   Yes Historical Provider, MD   BP 164/103 mmHg  Pulse 73  Temp(Src) 98.3 F (36.8 C) (Oral)  Resp 18  Ht 5\' 7"  (1.702 m)  Wt 203 lb 11.2 oz (92.398 kg)  BMI 31.90 kg/m2  SpO2 97% Physical Exam  Constitutional: He is oriented to person, place, and time. He appears  well-developed and well-nourished. No distress.  HENT:  Head: Normocephalic and atraumatic.  Mouth/Throat: Oropharynx is clear and moist. No oropharyngeal exudate.  Eyes: Conjunctivae and EOM are normal. Pupils are equal, round, and reactive to light.  Neck: Normal range of motion. Neck supple.  No meningismus.  Cardiovascular: Normal rate, regular rhythm, normal heart sounds and intact distal pulses.   No murmur heard. Pulmonary/Chest: Effort normal and breath sounds normal. No respiratory distress.  Abdominal: Soft. There is tenderness. There is no rebound and no guarding.  Periumbilical and right-sided abdominal pain. To the right of the umbilicus there is a 2 cm firm mass that is not reducible. There is also a second firm mass in the scar patient's previous hernia repair. There is no erythema or fluctuance  Genitourinary:  No testicular tenderness no appreciable inguinal hernias  Musculoskeletal: Normal range of motion. He exhibits no edema or tenderness.  Neurological: He is alert and oriented to person, place, and time. No cranial nerve deficit. He exhibits normal muscle tone. Coordination normal.  No ataxia on finger to nose bilaterally. No pronator drift. 5/5 strength throughout. CN 2-12 intact. Negative Romberg. Equal grip strength. Sensation intact. Gait is normal.   Skin: Skin is warm.  Psychiatric: He has a normal mood and affect. His behavior is normal.  Nursing note and vitals reviewed.   ED Course  Procedures (including critical care time) Labs Review Labs Reviewed  COMPREHENSIVE METABOLIC PANEL - Abnormal; Notable for the following:    Potassium 3.4 (*)    Chloride 98 (*)    Glucose, Bld 157 (*)    Creatinine, Ser 1.38 (*)    GFR calc non Af Amer 50 (*)    GFR calc Af Amer 57 (*)    All other components within normal limits  URINALYSIS, ROUTINE W REFLEX MICROSCOPIC (NOT AT [facility]) - Abnormal; Notable for the following:    APPearance CLOUDY (*)    Hgb urine dipstick  LARGE (*)    All other components within normal limits  CBC WITH DIFFERENTIAL/PLATELET - Abnormal; Notable for the following:    WBC 11.1 (*)    Neutrophils Relative % 88 (*)    Neutro Abs 9.7 (*)    Lymphocytes Relative 7 (*)    All other components within normal limits  PROTIME-INR - Abnormal; Notable for the following:    Prothrombin Time 15.4 (*)    All other components within normal limits  URINE MICROSCOPIC-ADD ON - Abnormal; Notable for the following:    Casts HYALINE CASTS (*)    All other components within normal limits  LIPASE, BLOOD  APTT  I-STAT CG4 LACTIC ACID, ED  POC OCCULT BLOOD, ED  I-STAT CG4 LACTIC ACID, ED  TYPE AND SCREEN  ABO/RH    Imaging Review Ct Abdomen Pelvis Wo Contrast  03/08/2015   CLINICAL DATA:  Acute onset of periumbilical abdominal pain. Vomiting and nausea. Initial encounter.  EXAM: CT ABDOMEN AND PELVIS WITHOUT CONTRAST  TECHNIQUE: Multidetector CT imaging of the abdomen and pelvis was performed following the standard protocol without IV contrast.  COMPARISON:  CT  of the abdomen and pelvis performed 12/22/2011, and renal ultrasound performed 07/20/2011  FINDINGS: The visualized lung bases are clear. Scattered coronary artery calcifications are seen.  A tiny calcified granuloma is noted at the left hepatic lobe. A small vague 8 mm hypodensity within the left hepatic lobe may reflect a small cyst, grossly stable from 2013. The spleen is unremarkable in appearance. The gallbladder is within normal limits.  A 2.5 cm fat containing right adrenal adenoma is noted. The left adrenal gland is unremarkable. The pancreas is within normal limits.  Vaguely abnormal contour of the left kidney near its lower pole, with minimal adjacent calcification, appears to reflect a cyst, on correlation with prior CT from 2013. Nonspecific perinephric stranding is noted bilaterally. A few nonobstructing right renal stones are seen, measuring up to 5 mm in size. There is no evidence  of hydronephrosis. No obstructing ureteral stones are identified.  Several anterior abdominal wall hernias are noted along the mid abdominal wall, just superior to the umbilicus, with a small umbilical hernia also seen. There is herniation of a short 4 cm segment of proximal ileum into a right-sided anterior abdominal wall hernia, resulting in relatively high-grade small bowel obstruction. There is dilatation of small-bowel loops to 3.8 cm in maximal diameter.  Trace free fluid is seen within the pelvis. There is decompression of all small bowel loops distal to the herniation. Residual stool is noted within the colon. Scattered diverticulosis is noted along the sigmoid colon, without definite evidence of diverticulitis.  The stomach is within normal limits. No acute vascular abnormalities are seen. Relatively diffuse calcification is noted along the abdominal aorta and its branches.  The appendix is decompressed.  There is no evidence of appendicitis.  The bladder is mildly distended and grossly unremarkable. The prostate remains normal in size, with scattered calcification. A moderate left inguinal hernia is seen, containing trace fluid. No inguinal lymphadenopathy is seen.  No acute osseous abnormalities are identified. Facet disease is noted at the lower lumbar spine. Endplate sclerotic change is seen at T10-T11.  IMPRESSION: 1. Focal herniation of a short 4 cm segment of proximal ileum into a right-sided anterior abdominal wall hernia, resulting in relatively high-grade small bowel obstruction. Dilatation of small-bowel loops to 3.8 cm in maximal diameter, trace free fluid in the pelvis. Decompression of small bowel loops distal to the herniation. 2. Multiple anterior abdominal wall hernias seen at the mid abdominal wall, just superior to the umbilicus, with a small umbilical hernia also seen. Moderate left inguinal hernia noted, containing trace fluid. 3. Scattered coronary artery calcifications seen. 4. Tiny  hepatic cyst. Apparent left renal cyst. Few nonobstructing right renal stones measure up to 5 mm in size. 5. 2.5 cm fat containing right adrenal adenoma noted. 6. Scattered diverticulosis along the sigmoid colon, without evidence of diverticulitis. 7. Relatively diffuse calcification along the abdominal aorta and its branches. These results were called by telephone at the time of interpretation on 03/08/2015 at 6:46 pm to Dr. Ezequiel Essex, who verbally acknowledged these results.   Electronically Signed   By: Garald Balding M.D.   On: 03/08/2015 18:48     EKG Interpretation None      MDM   Final diagnoses:  Incarcerated ventral hernia  Small bowel obstruction   Lower abdominal pain and nausea and vomiting. Concern for incarcerated ventral hernia. Patient states dark emesis but denies seeing any blood. Check Hemoccults.  Concern for incarcerated hernia discussed with Dr. Marlou Starks. He will evaluate and recommend  CT scan.  CT confirms incarcerated ventral hernia with SBO . Dr. Marlou Starks updated.  He will take patient to surgery.  CRITICAL CARE Performed by: Ezequiel Essex Total critical care time: 35 Critical care time was exclusive of separately billable procedures and treating other patients. Critical care was necessary to treat or prevent imminent or life-threatening deterioration. Critical care was time spent personally by me on the following activities: development of treatment plan with patient and/or surrogate as well as nursing, discussions with consultants, evaluation of patient's response to treatment, examination of patient, obtaining history from patient or surrogate, ordering and performing treatments and interventions, ordering and review of laboratory studies, ordering and review of radiographic studies, pulse oximetry and re-evaluation of patient's condition.    Ezequiel Essex, MD 03/09/15 [phone]

## 2015-03-08 NOTE — ED Notes (Signed)
Transporting patient to surgery.

## 2015-03-08 NOTE — ED Notes (Signed)
Pt here for abd pain and possible "hernia" that is tender and hard upon palpation; pt sts vomiting that is dark

## 2015-03-08 NOTE — ED Notes (Signed)
General surgery at bedside. 

## 2015-03-08 NOTE — ED Notes (Signed)
MD at bedside. 

## 2015-03-09 ENCOUNTER — Inpatient Hospital Stay (HOSPITAL_COMMUNITY): Payer: PPO | Admitting: Anesthesiology

## 2015-03-09 ENCOUNTER — Encounter (HOSPITAL_COMMUNITY): Admission: EM | Disposition: A | Discharge status: Home / Self Care | Payer: Self-pay | Source: Home / Self Care

## 2015-03-09 ENCOUNTER — Encounter (HOSPITAL_COMMUNITY): Payer: Self-pay | Admitting: Certified Registered"

## 2015-03-09 HISTORY — PX: LAPAROTOMY: SHX154

## 2015-03-09 SURGERY — LAPAROTOMY, EXPLORATORY
Anesthesia: General | Site: Abdomen

## 2015-03-09 MED ORDER — CEFAZOLIN SODIUM-DEXTROSE 2-3 GM-% IV SOLR
INTRAVENOUS | Status: DC | PRN
  Administered 2015-03-09: 2 g via INTRAVENOUS

## 2015-03-09 MED ORDER — PHENOL 1.4 % MT LIQD
1.0000 | OROMUCOSAL | Status: DC | PRN
  Administered 2015-03-09: 1 via OROMUCOSAL
  Filled 2015-03-09: qty 177

## 2015-03-09 MED ORDER — FENTANYL CITRATE (PF) 250 MCG/5ML IJ SOLN
INTRAMUSCULAR | Status: AC
  Filled 2015-03-09: qty 5

## 2015-03-09 MED ORDER — ONDANSETRON HCL 4 MG/2ML IJ SOLN: 4.0000 mg | Freq: Four times a day (QID) | INTRAMUSCULAR | Status: DC | PRN

## 2015-03-09 MED ORDER — CETYLPYRIDINIUM CHLORIDE 0.05 % MT LIQD
7.0000 mL | Freq: Two times a day (BID) | OROMUCOSAL | Status: DC
  Administered 2015-03-09 – 2015-03-13 (×9): 7 mL via OROMUCOSAL

## 2015-03-09 MED ORDER — ALBUMIN HUMAN 5 % IV SOLN
INTRAVENOUS | Status: DC | PRN
  Administered 2015-03-09: 08:00:00 via INTRAVENOUS

## 2015-03-09 MED ORDER — ROCURONIUM BROMIDE 50 MG/5ML IV SOLN
INTRAVENOUS | Status: AC
  Filled 2015-03-09: qty 1

## 2015-03-09 MED ORDER — PROPOFOL 10 MG/ML IV BOLUS
INTRAVENOUS | Status: DC | PRN
  Administered 2015-03-09: 150 mg via INTRAVENOUS

## 2015-03-09 MED ORDER — SUCCINYLCHOLINE CHLORIDE 20 MG/ML IJ SOLN
INTRAMUSCULAR | Status: AC
  Filled 2015-03-09: qty 1

## 2015-03-09 MED ORDER — MENTHOL 3 MG MT LOZG
1.0000 | LOZENGE | OROMUCOSAL | Status: DC | PRN
  Administered 2015-03-09: 3 mg via ORAL
  Filled 2015-03-09: qty 9

## 2015-03-09 MED ORDER — HEMOSTATIC AGENTS (NO CHARGE) OPTIME
TOPICAL | Status: DC | PRN
  Administered 2015-03-09: 1 via TOPICAL

## 2015-03-09 MED ORDER — HEMOSTATIC AGENTS (NO CHARGE) OPTIME
TOPICAL | Status: DC | PRN
  Administered 2015-03-09: 2 via TOPICAL

## 2015-03-09 MED ORDER — OXYCODONE HCL 5 MG PO TABS: 5.0000 mg | ORAL_TABLET | Freq: Once | ORAL | Status: DC | PRN

## 2015-03-09 MED ORDER — SUCCINYLCHOLINE CHLORIDE 20 MG/ML IJ SOLN
INTRAMUSCULAR | Status: DC | PRN
  Administered 2015-03-09: 100 mg via INTRAVENOUS

## 2015-03-09 MED ORDER — LACTATED RINGERS IV SOLN
INTRAVENOUS | Status: DC
  Administered 2015-03-09 (×2): via INTRAVENOUS

## 2015-03-09 MED ORDER — 0.9 % SODIUM CHLORIDE (POUR BTL) OPTIME
TOPICAL | Status: DC | PRN
  Administered 2015-03-09: 2000 mL

## 2015-03-09 MED ORDER — MIDAZOLAM HCL 2 MG/2ML IJ SOLN
INTRAMUSCULAR | Status: AC
  Filled 2015-03-09: qty 4

## 2015-03-09 MED ORDER — CEFAZOLIN SODIUM-DEXTROSE 2-3 GM-% IV SOLR
INTRAVENOUS | Status: AC
  Filled 2015-03-09: qty 50

## 2015-03-09 MED ORDER — PROPOFOL 10 MG/ML IV BOLUS
INTRAVENOUS | Status: AC
  Filled 2015-03-09: qty 20

## 2015-03-09 MED ORDER — OXYCODONE HCL 5 MG/5ML PO SOLN: 5.0000 mg | Freq: Once | ORAL | Status: DC | PRN

## 2015-03-09 MED ORDER — HYDROMORPHONE HCL 1 MG/ML IJ SOLN: 0.2500 mg | INTRAMUSCULAR | Status: DC | PRN

## 2015-03-09 MED ORDER — FENTANYL CITRATE (PF) 100 MCG/2ML IJ SOLN
INTRAMUSCULAR | Status: DC | PRN
  Administered 2015-03-09: 100 ug via INTRAVENOUS

## 2015-03-09 MED ORDER — EPHEDRINE SULFATE 50 MG/ML IJ SOLN
INTRAMUSCULAR | Status: DC | PRN
  Administered 2015-03-09 (×3): 10 mg via INTRAVENOUS

## 2015-03-09 MED ORDER — PHENYLEPHRINE HCL 10 MG/ML IJ SOLN
INTRAMUSCULAR | Status: DC | PRN
  Administered 2015-03-09: 120 ug via INTRAVENOUS
  Administered 2015-03-09 (×2): 80 ug via INTRAVENOUS
  Administered 2015-03-09: 200 ug via INTRAVENOUS
  Administered 2015-03-09 (×2): 120 ug via INTRAVENOUS
  Administered 2015-03-09: 80 ug via INTRAVENOUS

## 2015-03-09 MED ORDER — LIDOCAINE HCL (CARDIAC) 20 MG/ML IV SOLN
INTRAVENOUS | Status: DC | PRN
  Administered 2015-03-09: 60 mg via INTRAVENOUS

## 2015-03-09 MED ORDER — LIDOCAINE HCL (CARDIAC) 20 MG/ML IV SOLN
INTRAVENOUS | Status: AC
  Filled 2015-03-09: qty 5

## 2015-03-09 MED ORDER — MIDAZOLAM HCL 5 MG/5ML IJ SOLN
INTRAMUSCULAR | Status: DC | PRN
  Administered 2015-03-09: 2 mg via INTRAVENOUS

## 2015-03-09 SURGICAL SUPPLY — 46 items
BLADE SURG ROTATE 9660 (MISCELLANEOUS) IMPLANT
CANISTER SUCTION 2500CC (MISCELLANEOUS) ×3 IMPLANT
CHLORAPREP W/TINT 26ML (MISCELLANEOUS) IMPLANT
COVER MAYO STAND STRL (DRAPES) IMPLANT
COVER SURGICAL LIGHT HANDLE (MISCELLANEOUS) ×3 IMPLANT
DRAPE LAPAROSCOPIC ABDOMINAL (DRAPES) ×3 IMPLANT
DRAPE PROXIMA HALF (DRAPES) IMPLANT
DRAPE UTILITY XL STRL (DRAPES) IMPLANT
DRAPE WARM FLUID 44X44 (DRAPE) ×3 IMPLANT
DRSG OPSITE POSTOP 4X10 (GAUZE/BANDAGES/DRESSINGS) ×3 IMPLANT
DRSG OPSITE POSTOP 4X8 (GAUZE/BANDAGES/DRESSINGS) IMPLANT
ELECT BLADE 6.5 EXT (BLADE) IMPLANT
ELECT CAUTERY BLADE 6.4 (BLADE) ×3 IMPLANT
ELECT REM PT RETURN 9FT ADLT (ELECTROSURGICAL) ×3
ELECTRODE REM PT RTRN 9FT ADLT (ELECTROSURGICAL) ×1 IMPLANT
GAUZE SPONGE 4X4 12PLY STRL (GAUZE/BANDAGES/DRESSINGS) ×3 IMPLANT
GLOVE BIO SURGEON STRL SZ7.5 (GLOVE) ×3 IMPLANT
GOWN STRL REUS W/ TWL LRG LVL3 (GOWN DISPOSABLE) ×3 IMPLANT
GOWN STRL REUS W/TWL LRG LVL3 (GOWN DISPOSABLE) ×6
HEMOSTAT SNOW SURGICEL 2X4 (HEMOSTASIS) ×6 IMPLANT
KIT BASIN OR (CUSTOM PROCEDURE TRAY) ×3 IMPLANT
KIT ROOM TURNOVER OR (KITS) ×3 IMPLANT
LIGASURE IMPACT 36 18CM CVD LR (INSTRUMENTS) IMPLANT
NS IRRIG 1000ML POUR BTL (IV SOLUTION) ×9 IMPLANT
PACK GENERAL/GYN (CUSTOM PROCEDURE TRAY) ×3 IMPLANT
PAD ARMBOARD 7.5X6 YLW CONV (MISCELLANEOUS) ×3 IMPLANT
PENCIL BUTTON HOLSTER BLD 10FT (ELECTRODE) IMPLANT
SPECIMEN JAR LARGE (MISCELLANEOUS) IMPLANT
SPONGE LAP 18X18 X RAY DECT (DISPOSABLE) ×3 IMPLANT
STAPLER VISISTAT 35W (STAPLE) ×3 IMPLANT
SUCTION POOLE TIP (SUCTIONS) ×3 IMPLANT
SURGIFLO W/THROMBIN 8M KIT (HEMOSTASIS) ×3 IMPLANT
SUT PDS AB 1 TP1 96 (SUTURE) ×6 IMPLANT
SUT SILK 2 0 SH CR/8 (SUTURE) ×3 IMPLANT
SUT SILK 2 0 TIES 10X30 (SUTURE) ×3 IMPLANT
SUT SILK 3 0 SH CR/8 (SUTURE) ×3 IMPLANT
SUT SILK 3 0 TIES 10X30 (SUTURE) ×3 IMPLANT
SUT VIC AB 2-0 CT1 27 (SUTURE) ×2
SUT VIC AB 2-0 CT1 TAPERPNT 27 (SUTURE) ×1 IMPLANT
SUT VIC AB 3-0 SH 18 (SUTURE) IMPLANT
TAPE CLOTH SURG 4X10 WHT LF (GAUZE/BANDAGES/DRESSINGS) ×3 IMPLANT
TOWEL OR 17X26 10 PK STRL BLUE (TOWEL DISPOSABLE) ×3 IMPLANT
TRAY FOLEY CATH 16FRSI W/METER (SET/KITS/TRAYS/PACK) IMPLANT
TUBE CONNECTING 12'X1/4 (SUCTIONS)
TUBE CONNECTING 12X1/4 (SUCTIONS) IMPLANT
YANKAUER SUCT BULB TIP NO VENT (SUCTIONS) IMPLANT

## 2015-03-09 NOTE — Op Note (Signed)
03/08/2015 - 03/09/2015  8:31 AM  PATIENT:  Jim Dodson  74 y.o. male  PRE-OPERATIVE DIAGNOSIS:  question hematoma of surgical wound  POST-OPERATIVE DIAGNOSIS:  hematoma of surgical wound  PROCEDURE:  Procedure(s):  EVACUATION OF HEMATOMA from subcutaneous space  SURGEON:  Surgeon(s) and Role:    * Jovita Kussmaul, MD - Primary  PHYSICIAN ASSISTANT:   ASSISTANTS: none   ANESTHESIA:   general  EBL:  Total I/O In: 1250 [I.V.:1000; IV Piggyback:250] Out: -   BLOOD ADMINISTERED:none  DRAINS: (1) Jackson-Pratt drain(s) with closed bulb suction in the subq   LOCAL MEDICATIONS USED:  NONE  SPECIMEN:  No Specimen  DISPOSITION OF SPECIMEN:  N/A  COUNTS:  YES  TOURNIQUET:  * No tourniquets in log *  DICTATION: .Dragon Dictation  After informed consent was obtained the patient was brought to the operating room and placed in the supine position on the operating room table. After adequate induction of general anesthesia the patient's abdomen was prepped with Betadine and draped in usual sterile manner. The staples were removed. There was a large hematoma that was evacuated from the subcutaneous space. The fascial closure was intact. The blood was evacuated then the area was irrigated with copious amounts of saline. There was only some generalized ooze from the surface but no significant single point of bleeding. The entire surface area was fulgurated with the cautery until it appeared to be hemostatic. The wound was then coated in surgical flow and Surgicel Snow. The subcutaneous tissue was closed over the drain with a running 2-0 Vicryl stitch. The skin was then closed with staples. Sterile dressings were applied. The patient tolerated the procedure well. At the end of the case all needle sponge and instrument counts were correct. The patient was then awakened and taken to recovery in stable condition.  PLAN OF CARE: Admit to inpatient   PATIENT DISPOSITION:  PACU - hemodynamically  stable.   Delay start of Pharmacological VTE agent (>24hrs) due to surgical blood loss or risk of bleeding: yes

## 2015-03-09 NOTE — Transfer of Care (Signed)
Immediate Anesthesia Transfer of Care Note  Patient: Jim Dodson  Procedure(s) Performed: Procedure(s): EXPLORATORY LAPAROTOMY AND EVACUATION OF HEMATOMA (N/A)  Patient Location: PACU  Anesthesia Type:General  Level of Consciousness: awake, oriented and patient cooperative  Airway & Oxygen Therapy: Patient Spontanous Breathing and Patient connected to nasal cannula oxygen  Post-op Assessment: Report given to RN, Post -op Vital signs reviewed and stable and Patient moving all extremities  Post vital signs: Reviewed and stable  Last Vitals:  Filed Vitals:   03/09/15 0513  BP: 154/100  Pulse: 90  Temp: 36.5 C  Resp: 18    Complications: No apparent anesthesia complications

## 2015-03-09 NOTE — Anesthesia Postprocedure Evaluation (Signed)
  Anesthesia Post-op Note  Patient: Jim Dodson  Procedure(s) Performed: Procedure(s): EXPLORATORY LAPAROTOMY (N/A)  VENTRAL HERNIA  REPAIR ADULT (N/A) LYSIS OF ADHESION (N/A) OMENTECTOMY (N/A) UMBILICAL HERNIA REPAIR  ADULT  Patient Location: PACU  Anesthesia Type:General  Level of Consciousness: awake and alert   Airway and Oxygen Therapy: Patient Spontanous Breathing  Post-op Pain: mild  Post-op Assessment: Post-op Vital signs reviewed              Post-op Vital Signs: Reviewed  Last Vitals:  Filed Vitals:   03/09/15 0101  BP: 164/103  Pulse: 73  Temp: 36.8 C  Resp: 18    Complications: No apparent anesthesia complications

## 2015-03-09 NOTE — Anesthesia Procedure Notes (Signed)
Procedure Name: Intubation Date/Time: 03/09/2015 7:35 AM Performed by: Melina Copa, Magaret Justo R Pre-anesthesia Checklist: Patient identified, Emergency Drugs available, Suction available, Patient being monitored and Timeout performed Patient Re-evaluated:Patient Re-evaluated prior to inductionOxygen Delivery Method: Circle system utilized Preoxygenation: Pre-oxygenation with 100% oxygen Intubation Type: IV induction, Rapid sequence and Cricoid Pressure applied Ventilation: Mask ventilation without difficulty Laryngoscope Size: Mac and 4 Grade View: Grade II Tube type: Oral Tube size: 8.0 mm Number of attempts: 1 Airway Equipment and Method: Stylet Placement Confirmation: ETT inserted through vocal cords under direct vision,  positive ETCO2 and breath sounds checked- equal and bilateral Secured at: 22 cm Tube secured with: Tape Dental Injury: Teeth and Oropharynx as per pre-operative assessment

## 2015-03-09 NOTE — Anesthesia Preprocedure Evaluation (Signed)
Anesthesia Evaluation  Patient identified by MRN, date of birth, ID band Patient awake    Reviewed: Allergy & Precautions, NPO status , Patient's Chart, lab work & pertinent test results  Airway Mallampati: II   Neck ROM: full    Dental   Pulmonary neg pulmonary ROS,  breath sounds clear to auscultation        Cardiovascular hypertension, + CAD and + Cardiac Stents Rhythm:regular Rate:Normal     Neuro/Psych    GI/Hepatic S/p ex-lap yesterday for bowel obstruction.   Endo/Other  obese  Renal/GU      Musculoskeletal   Abdominal   Peds  Hematology   Anesthesia Other Findings   Reproductive/Obstetrics                             Anesthesia Physical Anesthesia Plan  ASA: III  Anesthesia Plan: General   Post-op Pain Management:    Induction: Intravenous  Airway Management Planned: Oral ETT  Additional Equipment:   Intra-op Plan:   Post-operative Plan: Extubation in OR  Informed Consent: I have reviewed the patients History and Physical, chart, labs and discussed the procedure including the risks, benefits and alternatives for the proposed anesthesia with the patient or authorized representative who has indicated his/her understanding and acceptance.     Plan Discussed with: CRNA, Anesthesiologist and Surgeon  Anesthesia Plan Comments:         Anesthesia Quick Evaluation

## 2015-03-09 NOTE — Progress Notes (Signed)
Patient ID: Jim Dodson, male   DOB: [birth year], 74 y.o.   MRN: [id number] Doing fine after hematoma evacuation, feels better,  Discussed with he and his wife

## 2015-03-09 NOTE — Progress Notes (Signed)
Utilization review completed.  

## 2015-03-09 NOTE — Progress Notes (Signed)
Swelling/hematoma noted around. pt's incision site. Dr Marlou Starks notified. Md said he will come and see pt. Will continue to monitor

## 2015-03-09 NOTE — Progress Notes (Signed)
Patient ID: Jim Dodson, male   DOB: 07/19/1941, 74 y.o.   MRN: [id number] Called by nurses this am for swelling at incision. Looks like he has some bleeding in the subq. Will plan to take back to OR to reexplore. Risks and benefits of surgery as well as some of the technical aspects discussed with pt and he understands and wishes to proceed.

## 2015-03-09 NOTE — Anesthesia Postprocedure Evaluation (Signed)
Anesthesia Post Note  Patient: Jim Dodson  Procedure(s) Performed: Procedure(s) (LRB): EXPLORATORY LAPAROTOMY AND EVACUATION OF HEMATOMA (N/A)  Anesthesia type: General  Patient location: PACU  Post pain: Pain level controlled and Adequate analgesia  Post assessment: Post-op Vital signs reviewed, Patient's Cardiovascular Status Stable, Respiratory Function Stable, Patent Airway and Pain level controlled  Last Vitals:  Filed Vitals:   03/09/15 1437  BP: 131/94  Pulse: 109  Temp: 36.6 C  Resp: 20    Post vital signs: Reviewed and stable  Level of consciousness: awake, alert  and oriented  Complications: No apparent anesthesia complications

## 2015-03-10 DIAGNOSIS — R7989 Other specified abnormal findings of blood chemistry: Secondary | ICD-10-CM

## 2015-03-10 DIAGNOSIS — R778 Other specified abnormalities of plasma proteins: Secondary | ICD-10-CM | POA: Insufficient documentation

## 2015-03-10 DIAGNOSIS — I4891 Unspecified atrial fibrillation: Secondary | ICD-10-CM | POA: Insufficient documentation

## 2015-03-10 DIAGNOSIS — Z7901 Long term (current) use of anticoagulants: Secondary | ICD-10-CM | POA: Insufficient documentation

## 2015-03-10 DIAGNOSIS — I480 Paroxysmal atrial fibrillation: Secondary | ICD-10-CM | POA: Insufficient documentation

## 2015-03-10 DIAGNOSIS — I2510 Atherosclerotic heart disease of native coronary artery without angina pectoris: Secondary | ICD-10-CM | POA: Insufficient documentation

## 2015-03-10 LAB — CBC WITH DIFFERENTIAL/PLATELET
Basophils Absolute: 0 10*3/uL (ref 0.0–0.1)
Basophils Relative: 0 % (ref 0–1)
Eosinophils Absolute: 0.1 10*3/uL (ref 0.0–0.7)
Eosinophils Relative: 1 % (ref 0–5)
HCT: 32.2 % — ABNORMAL LOW (ref 39.0–52.0)
Hemoglobin: 10.5 g/dL — ABNORMAL LOW (ref 13.0–17.0)
Lymphocytes Relative: 14 % (ref 12–46)
Lymphs Abs: 1.3 10*3/uL (ref 0.7–4.0)
MCH: 30.2 pg (ref 26.0–34.0)
MCHC: 32.6 g/dL (ref 30.0–36.0)
MCV: 92.5 fL (ref 78.0–100.0)
Monocytes Absolute: 1.1 10*3/uL — ABNORMAL HIGH (ref 0.1–1.0)
Monocytes Relative: 12 % (ref 3–12)
Neutro Abs: 6.6 10*3/uL (ref 1.7–7.7)
Neutrophils Relative %: 73 % (ref 43–77)
Platelets: 144 10*3/uL — ABNORMAL LOW (ref 150–400)
RBC: 3.48 MIL/uL — ABNORMAL LOW (ref 4.22–5.81)
RDW: 14.1 % (ref 11.5–15.5)
WBC: 9.1 10*3/uL (ref 4.0–10.5)

## 2015-03-10 LAB — MRSA PCR SCREENING: MRSA by PCR: NEGATIVE

## 2015-03-10 LAB — BASIC METABOLIC PANEL
Anion gap: 7 (ref 5–15)
Anion gap: 7 (ref 5–15)
BUN: 19 mg/dL (ref 6–20)
BUN: 18 mg/dL (ref 6–20)
CO2: 30 mmol/L (ref 22–32)
CO2: 30 mmol/L (ref 22–32)
Calcium: 8.4 mg/dL — ABNORMAL LOW (ref 8.9–10.3)
Calcium: 8.3 mg/dL — ABNORMAL LOW (ref 8.9–10.3)
Chloride: 106 mmol/L (ref 101–111)
Chloride: 107 mmol/L (ref 101–111)
Creatinine, Ser: 1.22 mg/dL (ref 0.61–1.24)
Creatinine, Ser: 1.05 mg/dL (ref 0.61–1.24)
GFR calc Af Amer: >60 mL/min (ref >60)
GFR calc Af Amer: >60 mL/min (ref >60)
GFR calc non Af Amer: 57 mL/min — ABNORMAL LOW (ref >60)
GFR calc non Af Amer: >60 mL/min (ref >60)
Glucose, Bld: 164 mg/dL — ABNORMAL HIGH (ref 65–99)
Glucose, Bld: 121 mg/dL — ABNORMAL HIGH (ref 65–99)
Potassium: 3.3 mmol/L — ABNORMAL LOW (ref 3.5–5.1)
Potassium: 3.5 mmol/L (ref 3.5–5.1)
Sodium: 143 mmol/L (ref 135–145)
Sodium: 144 mmol/L (ref 135–145)

## 2015-03-10 LAB — CBC
HCT: 33.5 % — ABNORMAL LOW (ref 39.0–52.0)
Hemoglobin: 11.1 g/dL — ABNORMAL LOW (ref 13.0–17.0)
MCH: 30.8 pg (ref 26.0–34.0)
MCHC: 33.1 g/dL (ref 30.0–36.0)
MCV: 93.1 fL (ref 78.0–100.0)
Platelets: 170 10*3/uL (ref 150–400)
RBC: 3.6 MIL/uL — ABNORMAL LOW (ref 4.22–5.81)
RDW: 14 % (ref 11.5–15.5)
WBC: 9.5 10*3/uL (ref 4.0–10.5)

## 2015-03-10 LAB — GLUCOSE, CAPILLARY: Glucose-Capillary: 126 mg/dL — ABNORMAL HIGH (ref 65–99)

## 2015-03-10 LAB — CK TOTAL AND CKMB (NOT AT ARMC)
CK, MB: 5.1 ng/mL — ABNORMAL HIGH (ref 0.5–5.0)
Relative Index: 2 (ref 0.0–2.5)
Total CK: 257 U/L (ref 49–397)

## 2015-03-10 LAB — TROPONIN I: Troponin I: 0.1 ng/mL — ABNORMAL HIGH (ref <0.031)

## 2015-03-10 MED ORDER — DILTIAZEM HCL 100 MG IV SOLR
5.0000 mg/h | INTRAVENOUS | Status: DC
  Administered 2015-03-10 – 2015-03-11 (×2): 5 mg/h via INTRAVENOUS
  Administered 2015-03-11: 7.5 mg/h via INTRAVENOUS
  Filled 2015-03-10 (×4): qty 100

## 2015-03-10 MED ORDER — POTASSIUM CHLORIDE 10 MEQ/100ML IV SOLN
10.0000 meq | INTRAVENOUS | Status: AC
Start: 2015-03-10 — End: 2015-03-10
  Administered 2015-03-10 (×3): 10 meq via INTRAVENOUS
  Filled 2015-03-10 (×3): qty 100

## 2015-03-10 MED ORDER — HEPARIN BOLUS VIA INFUSION
2000.0000 [IU] | Freq: Once | INTRAVENOUS | Status: AC
  Administered 2015-03-10: 2000 [IU] via INTRAVENOUS
  Filled 2015-03-10: qty 2000

## 2015-03-10 MED ORDER — HEPARIN (PORCINE) IN NACL 100-0.45 UNIT/ML-% IJ SOLN
1450.0000 [IU]/h | INTRAMUSCULAR | Status: DC
  Administered 2015-03-10 – 2015-03-11 (×2): 950 [IU]/h via INTRAVENOUS
  Administered 2015-03-13: 1300 [IU]/h via INTRAVENOUS
  Administered 2015-03-14: 1450 [IU]/h via INTRAVENOUS
  Filled 2015-03-10 (×8): qty 250

## 2015-03-10 MED ORDER — METOPROLOL TARTRATE 1 MG/ML IV SOLN
2.5000 mg | Freq: Once | INTRAVENOUS | Status: AC
  Administered 2015-03-10: 2.5 mg via INTRAVENOUS

## 2015-03-10 MED ORDER — SODIUM CHLORIDE 0.9 % IV SOLN
INTRAVENOUS | Status: DC
  Administered 2015-03-10: 17:00:00 via INTRAVENOUS

## 2015-03-10 MED ORDER — METOPROLOL TARTRATE 1 MG/ML IV SOLN
INTRAVENOUS | Status: AC
  Filled 2015-03-10: qty 5

## 2015-03-10 MED ORDER — METOPROLOL TARTRATE 1 MG/ML IV SOLN
2.0000 mg | Freq: Four times a day (QID) | INTRAVENOUS | Status: DC
  Administered 2015-03-10 – 2015-03-12 (×9): 2 mg via INTRAVENOUS
  Filled 2015-03-10 (×12): qty 5

## 2015-03-10 NOTE — Significant Event (Signed)
Rapid Response Event Note  Overview: Time Called: 2947 Arrival Time: 6546 Event Type: Cardiac  Initial Focused Assessment: Patient sp hernia repair and hematoma evacuation Patient with SVT 160s,   Patient denies CT, SOB or dizziness, doesn't feel his heart racing. Lung sounds clear, heart tones irregular.  Interventions: 12 lead EKG done 2mg  lopressor given  BP 129/79  AF 122  RR 20 O2 sat 96% on RA, temp 98.6 Dr Marlou Starks at bedside to assess patient. Additional 2.5mg  Lopressor given IV HR 93  BP 134/86   Transported to 2M09 with heart monitor: Plan Cardizem gtt  Event Summary: Name of Physician Notified: Toth at 1435    at    Outcome: Transferred (Comment) [phone])  Event End Time: [place]  Raliegh Ip

## 2015-03-10 NOTE — Consult Note (Addendum)
CARDIOLOGY INPATIENT CONSULTATION NOTE  Patient ID: Jim Dodson MRN: [id number], DOB/AGE: [birth year]   Admit date: 03/08/2015   Primary Physician: No PCP Per Patient Primary Cardiologist: Bartholome Bill MD [facility], [place])  Reason for Consult:   Atrial fibrillation with RVR and elevated troponin  Requesting Physician: Dr. Autumn Messing general surgery team  HPI: This is a 74 y.o.white male with no history of CAD, but has history of hypertension), abdominal hernia who presented with pain in the inguinal region. Patient is admitted under general surgery service and is being managed for SBO 2/2 ventral hernia and underwent surgery on 03/09/2015. His post op course was complicated with abdominal wall hematoma requiring evacuation. He was on plavix and it was held. Patient was in his usual state of health when he developed atrial fibrillation with RVR today. He denied chest pain, dyspnea, PND, orthopnea, leg swelling, increased abdominal girth, syncope, presyncope, palpitations. He is totally asymptomatic with the rhythm. There is no documentation of prior sinus rhythm. Last EKG is in 2010. Patient was started on cardiazem drip and his HR was controlled. His vitals remained stable. Cardiology was consulted for elevated troponin of 0.10.   Problem List: Past Medical History  Diagnosis Date  . Hypertension     History reviewed. No pertinent past surgical history.   Allergies:  Allergies  Allergen Reactions  . Statins      Home Medications Current Facility-Administered Medications  Medication Dose Route Frequency Provider Last Rate Last Dose  . 0.9 %  sodium chloride infusion   Intravenous Continuous Md Ccs, MD 10 mL/hr at 03/10/15 1648    . antiseptic oral rinse (CPC / CETYLPYRIDINIUM CHLORIDE 0.05%) solution 7 mL  7 mL Mouth Rinse BID Rolm Bookbinder, MD   7 mL at 03/10/15 1000  . dextrose 5 % and 0.9 % NaCl with KCl 20 mEq/L infusion   Intravenous Continuous Autumn Messing III, MD 100  mL/hr at 03/10/15 1600    . diltiazem (CARDIZEM) 100 mg in dextrose 5 % 100 mL (1 mg/mL) infusion  5-15 mg/hr Intravenous Titrated Autumn Messing III, MD 10 mL/hr at 03/10/15 1800 10 mg/hr at 03/10/15 1800  . hydrALAZINE (APRESOLINE) injection 10 mg  10 mg Intravenous Q2H PRN Autumn Messing III, MD      . menthol-cetylpyridinium (CEPACOL) lozenge 3 mg  1 lozenge Oral PRN Rolm Bookbinder, MD   3 mg at 03/09/15 1243  . metoprolol (LOPRESSOR) injection 2 mg  2 mg Intravenous 4 times per day Autumn Messing III, MD   2 mg at 03/10/15 1442  . morphine 2 MG/ML injection 1-4 mg  1-4 mg Intravenous Q1H PRN Autumn Messing III, MD   2 mg at 03/10/15 1422  . ondansetron (ZOFRAN) injection 4 mg  4 mg Intravenous Once Ezequiel Essex, MD   Stopped at 03/08/15 1926  . ondansetron (ZOFRAN-ODT) disintegrating tablet 4 mg  4 mg Oral Q6H PRN Autumn Messing III, MD       Or  . ondansetron [facility]) injection 4 mg  4 mg Intravenous Q6H PRN Autumn Messing III, MD   4 mg at 03/08/15 2153  . pantoprazole (PROTONIX) injection 40 mg  40 mg Intravenous QHS Autumn Messing III, MD   40 mg at 03/09/15 2148  . phenol (CHLORASEPTIC) mouth spray 1 spray  1 spray Mouth/Throat PRN Rolm Bookbinder, MD   1 spray at 03/09/15 1243     History reviewed. No pertinent family history.   History   Social History  . Marital  Status: Married    Spouse Name: N/A  . Number of Children: N/A  . Years of Education: N/A   Occupational History  . Not on file.   Social History Main Topics  . Smoking status: Never Smoker   . Smokeless tobacco: Not on file  . Alcohol Use: No  . Drug Use: No  . Sexual Activity: Not on file   Other Topics Concern  . Not on file   Social History Narrative     Review of Systems: General: no fevers, chills, weight changes   Cardiovascular: lno leg edema, dyspnea or chest pain  Dermatological: negative for rash Respiratory: negative for wheezing  Urologic: negative for hematuria Abdominal: negative for nausea, vomiting,  diarrhea, bright red blood per rectum, melena, or hematemesis Neurologic: negative for visual changes, syncope, or dizziness Hematology: mild anemia Psychiatry: non suicidal/homicidal  Musculoskeletal: back pain   Physical Exam: Vitals: BP 121/82 mmHg  Pulse 90  Temp(Src) 98.6 F (37 C) (Oral)  Resp 18  Ht 5\' 6"  (1.676 m)  Wt 92.9 kg (204 lb 12.9 oz)  BMI 33.07 kg/m2  SpO2 99% General: not in acute distress Neck: JVP flat, neck supple Heart: irregular rate and rhythm, S1, S2, no murmurs  Lungs: CTAB  GI: tender, stitches with fresh midline incision, decreased BS Extremities: no edema Neuro: AAO x 3  Psych: normal affect, no anxiety   Labs:   Results for orders placed or performed during the hospital encounter of 03/08/15 (from the past 24 hour(s))  CBC     Status: Abnormal   Collection Time: 03/10/15  1:07 PM  Result Value Ref Range   WBC 9.5 4.0 - 10.5 K/uL   RBC 3.60 (L) 4.22 - 5.81 MIL/uL   Hemoglobin 11.1 (L) 13.0 - 17.0 g/dL   HCT 33.5 (L) 39.0 - 52.0 %   MCV 93.1 78.0 - 100.0 fL   MCH 30.8 26.0 - 34.0 pg   MCHC 33.1 30.0 - 36.0 g/dL   RDW 14.0 11.5 - 15.5 %   Platelets 170 150 - 400 K/uL  Basic metabolic panel     Status: Abnormal   Collection Time: 03/10/15  1:07 PM  Result Value Ref Range   Sodium 143 135 - 145 mmol/L   Potassium 3.3 (L) 3.5 - 5.1 mmol/L   Chloride 106 101 - 111 mmol/L   CO2 30 22 - 32 mmol/L   Glucose, Bld 164 (H) 65 - 99 mg/dL   BUN 19 6 - 20 mg/dL   Creatinine, Ser 1.22 0.61 - 1.24 mg/dL   Calcium 8.4 (L) 8.9 - 10.3 mg/dL   GFR calc non Af Amer 57 (L) >60 mL/min   GFR calc Af Amer >60 >60 mL/min   Anion gap 7 5 - 15  Glucose, capillary     Status: Abnormal   Collection Time: 03/10/15  3:50 PM  Result Value Ref Range   Glucose-Capillary 126 (H) 65 - 99 mg/dL  CK total and CKMB (cardiac)not at [facility]     Status: Abnormal   Collection Time: 03/10/15  4:15 PM  Result Value Ref Range   Total CK 257 49 - 397 U/L   CK, MB 5.1 (H) 0.5 -  5.0 ng/mL   Relative Index 2.0 0.0 - 2.5  Troponin I (q 6hr x 3)     Status: Abnormal   Collection Time: 03/10/15  4:15 PM  Result Value Ref Range   Troponin I 0.10 (H) <0.031 ng/mL  CBC with Differential/Platelet  Status: Abnormal   Collection Time: 03/10/15  4:15 PM  Result Value Ref Range   WBC 9.1 4.0 - 10.5 K/uL   RBC 3.48 (L) 4.22 - 5.81 MIL/uL   Hemoglobin 10.5 (L) 13.0 - 17.0 g/dL   HCT 32.2 (L) 39.0 - 52.0 %   MCV 92.5 78.0 - 100.0 fL   MCH 30.2 26.0 - 34.0 pg   MCHC 32.6 30.0 - 36.0 g/dL   RDW 14.1 11.5 - 15.5 %   Platelets 144 (L) 150 - 400 K/uL   Neutrophils Relative % 73 43 - 77 %   Neutro Abs 6.6 1.7 - 7.7 K/uL   Lymphocytes Relative 14 12 - 46 %   Lymphs Abs 1.3 0.7 - 4.0 K/uL   Monocytes Relative 12 3 - 12 %   Monocytes Absolute 1.1 (H) 0.1 - 1.0 K/uL   Eosinophils Relative 1 0 - 5 %   Eosinophils Absolute 0.1 0.0 - 0.7 K/uL   Basophils Relative 0 0 - 1 %   Basophils Absolute 0.0 0.0 - 0.1 K/uL  Basic metabolic panel     Status: Abnormal   Collection Time: 03/10/15  4:15 PM  Result Value Ref Range   Sodium 144 135 - 145 mmol/L   Potassium 3.5 3.5 - 5.1 mmol/L   Chloride 107 101 - 111 mmol/L   CO2 30 22 - 32 mmol/L   Glucose, Bld 121 (H) 65 - 99 mg/dL   BUN 18 6 - 20 mg/dL   Creatinine, Ser 1.05 0.61 - 1.24 mg/dL   Calcium 8.3 (L) 8.9 - 10.3 mg/dL   GFR calc non Af Amer >60 >60 mL/min   GFR calc Af Amer >60 >60 mL/min   Anion gap 7 5 - 15     Radiology/Studies: Ct Abdomen Pelvis Wo Contrast  03/08/2015   CLINICAL DATA:  Acute onset of periumbilical abdominal pain. Vomiting and nausea. Initial encounter.  EXAM: CT ABDOMEN AND PELVIS WITHOUT CONTRAST  TECHNIQUE: Multidetector CT imaging of the abdomen and pelvis was performed following the standard protocol without IV contrast.  COMPARISON:  CT of the abdomen and pelvis performed 12/22/2011, and renal ultrasound performed 07/20/2011  FINDINGS: The visualized lung bases are clear. Scattered coronary artery  calcifications are seen.  A tiny calcified granuloma is noted at the left hepatic lobe. A small vague 8 mm hypodensity within the left hepatic lobe may reflect a small cyst, grossly stable from 2013. The spleen is unremarkable in appearance. The gallbladder is within normal limits.  A 2.5 cm fat containing right adrenal adenoma is noted. The left adrenal gland is unremarkable. The pancreas is within normal limits.  Vaguely abnormal contour of the left kidney near its lower pole, with minimal adjacent calcification, appears to reflect a cyst, on correlation with prior CT from 2013. Nonspecific perinephric stranding is noted bilaterally. A few nonobstructing right renal stones are seen, measuring up to 5 mm in size. There is no evidence of hydronephrosis. No obstructing ureteral stones are identified.  Several anterior abdominal wall hernias are noted along the mid abdominal wall, just superior to the umbilicus, with a small umbilical hernia also seen. There is herniation of a short 4 cm segment of proximal ileum into a right-sided anterior abdominal wall hernia, resulting in relatively high-grade small bowel obstruction. There is dilatation of small-bowel loops to 3.8 cm in maximal diameter.  Trace free fluid is seen within the pelvis. There is decompression of all small bowel loops distal to the herniation. Residual  stool is noted within the colon. Scattered diverticulosis is noted along the sigmoid colon, without definite evidence of diverticulitis.  The stomach is within normal limits. No acute vascular abnormalities are seen. Relatively diffuse calcification is noted along the abdominal aorta and its branches.  The appendix is decompressed.  There is no evidence of appendicitis.  The bladder is mildly distended and grossly unremarkable. The prostate remains normal in size, with scattered calcification. A moderate left inguinal hernia is seen, containing trace fluid. No inguinal lymphadenopathy is seen.  No acute  osseous abnormalities are identified. Facet disease is noted at the lower lumbar spine. Endplate sclerotic change is seen at T10-T11.  IMPRESSION: 1. Focal herniation of a short 4 cm segment of proximal ileum into a right-sided anterior abdominal wall hernia, resulting in relatively high-grade small bowel obstruction. Dilatation of small-bowel loops to 3.8 cm in maximal diameter, trace free fluid in the pelvis. Decompression of small bowel loops distal to the herniation. 2. Multiple anterior abdominal wall hernias seen at the mid abdominal wall, just superior to the umbilicus, with a small umbilical hernia also seen. Moderate left inguinal hernia noted, containing trace fluid. 3. Scattered coronary artery calcifications seen. 4. Tiny hepatic cyst. Apparent left renal cyst. Few nonobstructing right renal stones measure up to 5 mm in size. 5. 2.5 cm fat containing right adrenal adenoma noted. 6. Scattered diverticulosis along the sigmoid colon, without evidence of diverticulitis. 7. Relatively diffuse calcification along the abdominal aorta and its branches. These results were called by telephone at the time of interpretation on 03/08/2015 at 6:46 pm to Dr. Ezequiel Essex, who verbally acknowledged these results.   Electronically Signed   By: Garald Balding M.D.   On: 03/08/2015 18:48    EKG: atrial fibrillation with rapid ventricular rate with ST and T wave inversion   Echo: pending  Cardiac cath: outside facility   Medical decision making:  Discussed care with the patient, wife and daughter Discussed care with the physician  Reviewed labs and imaging personally Reviewed prior records  ASSESSMENT AND PLAN:  Assessment  This is 74 y.o.white male with new onset atrial fibrillation, without symptoms for unknown duration likely >48 hours who has prior history of CAD s/p PCI in 2010 and has indeterminate levels of troponin elevation post surgery/atrial fib w/RVR.   Unknown duration, asymptomatic new  onset Atrial fibrillation with rapid ventricular rate, CHADS2VASC = 3, HASBLED score of 2 Chronic coronary artery disease s/p PCI in 2010 Elevated troponin without symptoms, likely secondary to demand ischemia mismatch  Plan: - start on IV heparin drip for possible TEE/DCCV tomorrow - NPO post midnight - per Dr. Marlou Starks, okay to start IV heparin drip - hold plavix for now since last stent was placed in 2010 and moderate risk of bleeding - continue cardiazem drip as he is still on ice chips and not eating, could convert to oral diltiazem once able to tolerate PO - life long aspirin could be restarted after TEE/DCCV and stable abdomen >48 hours - echocardiogram ordered to evaluate LVEF - checking TSH - replacing potassium  - Keep potassium >4, calcium >9, magnesium >2   Signed, Flossie Dibble, MD MS 03/10/2015, 6:20 PM

## 2015-03-10 NOTE — Progress Notes (Signed)
1 Day Post-Op  Subjective: Feels ok, no flatus, no n/v  Objective: Vital signs in last 24 hours: Temp:  [97.6 F (36.4 C)-98.6 F (37 C)] 97.6 F (36.4 C) (08/07 0621) Pulse Rate:  [62-115] 86 (08/07 0621) Resp:  [18-20] 18 (08/07 0621) BP: (120-151)/(86-94) 120/86 mmHg (08/07 0621) SpO2:  [89 %-99 %] 98 % (08/07 0621) Last BM Date: 03/08/15  Intake/Output from previous day: 08/06 0701 - 08/07 0700 In: 4550 [I.V.:4300; IV Piggyback:250] Out: 1075 [Urine:400; Emesis/NG output:350; Drains:175; Blood:150] Intake/Output this shift:    Resp: clear to auscultation bilaterally Cardio: regular rate and rhythm GI: soft with some fluid under incision, bloody drainage in jp, bs present  Lab Results:   Recent Labs  03/08/15 1528  WBC 11.1*  HGB 15.8  HCT 46.1  PLT 199   BMET  Recent Labs  03/08/15 1528  NA 141  K 3.4*  CL 98*  CO2 29  GLUCOSE 157*  BUN 12  CREATININE 1.38*  CALCIUM 10.1   PT/INR  Recent Labs  03/08/15 1954  LABPROT 15.4*  INR 1.20   ABG No results for input(s): PHART, HCO3 in the last 72 hours.  Invalid input(s): PCO2, PO2  Studies/Results: Ct Abdomen Pelvis Wo Contrast  03/08/2015   CLINICAL DATA:  Acute onset of periumbilical abdominal pain. Vomiting and nausea. Initial encounter.  EXAM: CT ABDOMEN AND PELVIS WITHOUT CONTRAST  TECHNIQUE: Multidetector CT imaging of the abdomen and pelvis was performed following the standard protocol without IV contrast.  COMPARISON:  CT of the abdomen and pelvis performed 12/22/2011, and renal ultrasound performed 07/20/2011  FINDINGS: The visualized lung bases are clear. Scattered coronary artery calcifications are seen.  A tiny calcified granuloma is noted at the left hepatic lobe. A small vague 8 mm hypodensity within the left hepatic lobe may reflect a small cyst, grossly stable from 2013. The spleen is unremarkable in appearance. The gallbladder is within normal limits.  A 2.5 cm fat containing right  adrenal adenoma is noted. The left adrenal gland is unremarkable. The pancreas is within normal limits.  Vaguely abnormal contour of the left kidney near its lower pole, with minimal adjacent calcification, appears to reflect a cyst, on correlation with prior CT from 2013. Nonspecific perinephric stranding is noted bilaterally. A few nonobstructing right renal stones are seen, measuring up to 5 mm in size. There is no evidence of hydronephrosis. No obstructing ureteral stones are identified.  Several anterior abdominal wall hernias are noted along the mid abdominal wall, just superior to the umbilicus, with a small umbilical hernia also seen. There is herniation of a short 4 cm segment of proximal ileum into a right-sided anterior abdominal wall hernia, resulting in relatively high-grade small bowel obstruction. There is dilatation of small-bowel loops to 3.8 cm in maximal diameter.  Trace free fluid is seen within the pelvis. There is decompression of all small bowel loops distal to the herniation. Residual stool is noted within the colon. Scattered diverticulosis is noted along the sigmoid colon, without definite evidence of diverticulitis.  The stomach is within normal limits. No acute vascular abnormalities are seen. Relatively diffuse calcification is noted along the abdominal aorta and its branches.  The appendix is decompressed.  There is no evidence of appendicitis.  The bladder is mildly distended and grossly unremarkable. The prostate remains normal in size, with scattered calcification. A moderate left inguinal hernia is seen, containing trace fluid. No inguinal lymphadenopathy is seen.  No acute osseous abnormalities are identified. Facet disease  is noted at the lower lumbar spine. Endplate sclerotic change is seen at T10-T11.  IMPRESSION: 1. Focal herniation of a short 4 cm segment of proximal ileum into a right-sided anterior abdominal wall hernia, resulting in relatively high-grade small bowel  obstruction. Dilatation of small-bowel loops to 3.8 cm in maximal diameter, trace free fluid in the pelvis. Decompression of small bowel loops distal to the herniation. 2. Multiple anterior abdominal wall hernias seen at the mid abdominal wall, just superior to the umbilicus, with a small umbilical hernia also seen. Moderate left inguinal hernia noted, containing trace fluid. 3. Scattered coronary artery calcifications seen. 4. Tiny hepatic cyst. Apparent left renal cyst. Few nonobstructing right renal stones measure up to 5 mm in size. 5. 2.5 cm fat containing right adrenal adenoma noted. 6. Scattered diverticulosis along the sigmoid colon, without evidence of diverticulitis. 7. Relatively diffuse calcification along the abdominal aorta and its branches. These results were called by telephone at the time of interpretation on 03/08/2015 at 6:46 pm to Dr. Ezequiel Essex, who verbally acknowledged these results.   Electronically Signed   By: Garald Balding M.D.   On: 03/08/2015 18:48    Anti-infectives: Anti-infectives    Start     Dose/Rate Route Frequency Ordered Stop   03/08/15 1945  ceFAZolin (ANCEF) IVPB 2 g/50 mL premix     2 g 100 mL/hr over 30 Minutes Intravenous  Once 03/08/15 1930 03/08/15 2017      Assessment/Plan: Pod 2/1 elap for sbo/hematoma evac  1. Continue iv pain meds 2. pulm toilet 3. Sips and dc ng today 4. Dc foley 5. Check bmet and cbc today (still with some bloody drainage but decreased) 6. Scds, hold pharm dvt proph due to risk of bleed  [facility] 03/10/2015

## 2015-03-10 NOTE — Progress Notes (Addendum)
ANTICOAGULATION CONSULT NOTE - Initial Consult  Pharmacy Consult for heparin Indication: atrial fibrillation  Allergies  Allergen Reactions  . Statins     Patient Measurements: Height: 5\' 6"  (167.6 cm) Weight: 204 lb 12.9 oz (92.9 kg) IBW/kg (Calculated) : 63.8 Heparin Dosing Weight: 72kg  Vital Signs: Temp: 98.6 F (37 C) (08/07 1600) Temp Source: Oral (08/07 1600) BP: 116/78 mmHg (08/07 1845) Pulse Rate: 95 (08/07 1845)  Labs:  Recent Labs  03/08/15 1528 03/08/15 1954 03/10/15 1307 03/10/15 1615  HGB 15.8  --  11.1* 10.5*  HCT 46.1  --  33.5* 32.2*  PLT 199  --  170 144*  APTT  --  25  --   --   LABPROT  --  15.4*  --   --   INR  --  1.20  --   --   CREATININE 1.38*  --  1.22 1.05  CKTOTAL  --   --   --  257  CKMB  --   --   --  5.1*  TROPONINI  --   --   --  0.10*    Estimated Creatinine Clearance: 67.8 mL/min (by C-G formula based on Cr of 1.05).   Medical History: Past Medical History  Diagnosis Date  . Hypertension     Assessment: 66 YOM with no CAD history who is s/p surgery for ventral hernia on 8/6. Today he developedAFib with RVR. To start heparin in anticipation for DCCV tomorrow. On no anticoagulants PTA. Was not on Lovenox or subq heparin post-op.  Hgb 10.5, plts 144- no overt bleeding noted.  Goal of Therapy:  Heparin level 0.3-0.5 (correlates with aPTT 55-75 as requested by MD) units/ml Monitor platelets by anticoagulation protocol: Yes   Plan:  -will give small bolus with heparin 2000 units IV x1, then start infusion at 950 units/hr -first heparin level with AM labs -daily HL and CBC -follow for s/s bleeding  Jim Dodson, PharmD, BCPS Clinical Pharmacist Pager: [phone] 03/10/2015 8:00 PM

## 2015-03-10 NOTE — Progress Notes (Signed)
Dr Marlou Starks notified of patient's transfer, vitals, 12 lead ekg, and labs;  No orders received; will continue to monitor.  Lenor Coffin, RN

## 2015-03-10 NOTE — Progress Notes (Signed)
Pt woke up agitated and mildly confused. Wife is at bedside and expressed her concern that pt is not on his normal mood. VS stable. Physical and emotional support given to the pt and wife. MD made aware. Will continue to monitor.

## 2015-03-11 ENCOUNTER — Encounter (HOSPITAL_COMMUNITY): Payer: Self-pay | Admitting: General Surgery

## 2015-03-11 DIAGNOSIS — I2510 Atherosclerotic heart disease of native coronary artery without angina pectoris: Secondary | ICD-10-CM

## 2015-03-11 DIAGNOSIS — I4891 Unspecified atrial fibrillation: Secondary | ICD-10-CM

## 2015-03-11 DIAGNOSIS — Z7901 Long term (current) use of anticoagulants: Secondary | ICD-10-CM

## 2015-03-11 LAB — HEPARIN LEVEL (UNFRACTIONATED)
Heparin Unfractionated: 0.3 IU/mL (ref 0.30–0.70)
Heparin Unfractionated: 0.31 IU/mL (ref 0.30–0.70)

## 2015-03-11 LAB — CBC
HCT: 27.8 % — ABNORMAL LOW (ref 39.0–52.0)
Hemoglobin: 9.1 g/dL — ABNORMAL LOW (ref 13.0–17.0)
MCH: 30.5 pg (ref 26.0–34.0)
MCHC: 32.7 g/dL (ref 30.0–36.0)
MCV: 93.3 fL (ref 78.0–100.0)
Platelets: 120 10*3/uL — ABNORMAL LOW (ref 150–400)
RBC: 2.98 MIL/uL — ABNORMAL LOW (ref 4.22–5.81)
RDW: 14.1 % (ref 11.5–15.5)
WBC: 6.6 10*3/uL (ref 4.0–10.5)

## 2015-03-11 LAB — TSH: TSH: 3.313 u[IU]/mL (ref 0.350–4.500)

## 2015-03-11 NOTE — Progress Notes (Addendum)
ANTICOAGULATION CONSULT NOTE - Initial Consult  Pharmacy Consult for heparin Indication: atrial fibrillation  Allergies  Allergen Reactions  . Statins     Patient Measurements: Height: 5\' 6"  (167.6 cm) Weight: 204 lb 12.9 oz (92.9 kg) IBW/kg (Calculated) : 63.8 Heparin Dosing Weight: 72kg  Vital Signs: Temp: 98 F (36.7 C) (08/08 1214) Temp Source: Oral (08/08 1214) BP: 135/82 mmHg (08/08 1300) Pulse Rate: 94 (08/08 1300)  Labs:  Recent Labs  03/08/15 1528 03/08/15 1954 03/10/15 1307 03/10/15 1615 03/11/15 0500 03/11/15 1202  HGB 15.8  --  11.1* 10.5* 9.1*  --   HCT 46.1  --  33.5* 32.2* 27.8*  --   PLT 199  --  170 144* 120*  --   APTT  --  25  --   --   --   --   LABPROT  --  15.4*  --   --   --   --   INR  --  1.20  --   --   --   --   HEPARINUNFRC  --   --   --   --  0.30 0.31  CREATININE 1.38*  --  1.22 1.05  --   --   CKTOTAL  --   --   --  257  --   --   CKMB  --   --   --  5.1*  --   --   TROPONINI  --   --   --  0.10*  --   --     Estimated Creatinine Clearance: 67.8 mL/min (by C-G formula based on Cr of 1.05).   Medical History: Past Medical History  Diagnosis Date  . Hypertension     Assessment: 55 YOM with no CAD history who is s/p surgery for ventral hernia on 8/6. Today he developedAFib with RVR. Therapeutic on heparin On no anticoagulants PTA. hgb down to 9.1, plts 120. No s/sx bleeding. Will aim for low goal per MD request.   Goal of Therapy:  Heparin level 0.3-0.5 (correlates with aPTT 55-75 as requested by MD) units/ml Monitor platelets by anticoagulation protocol: Yes   Plan:  -Continue heparin at 950 units/hr  -daily HL and CBC -follow for s/sx bleeding -Planning for [place], PharmD, BCPS Clinical Pharmacist Pager: [phone] 03/11/2015 1:31 PM

## 2015-03-11 NOTE — Progress Notes (Signed)
Patient Name: AEDON DEASON Date of Encounter: 03/11/2015  Active Problems:   SBO (small bowel obstruction)   Atrial fibrillation with rapid ventricular response   Chronic coronary artery disease   Elevated troponin   Patient receiving intravenous heparin therapy   Primary Cardiologist: Bartholome Bill MD [facility], [place]) Patient Profile: 74 yo male with PMH of CAD (PCI, 2010) HTN and abdominal hernia admitted on 03/08/15 for SBO 2ry to ventral hernia. Underwent surgery on 03/08/15 followed by second surgery on 03/09/15 for evacuation of hematoma. Went into Afib w/RVR on 03/10/15.   SUBJECTIVE: Denies chest pain or palpitations. Denies any SOB.  OBJECTIVE Filed Vitals:   03/11/15 0900 03/11/15 1000 03/11/15 1100 03/11/15 1214  BP: 108/76 93/78 115/67   Pulse: 90 96 84   Temp:    98 F (36.7 C)  TempSrc:    Oral  Resp: 24 25 21    Height:      Weight:      SpO2: 98% 100% 97%     Intake/Output Summary (Last 24 hours) at 03/11/15 1235 Last data filed at 03/11/15 1100  Gross per 24 hour  Intake 2568.85 ml  Output   1255 ml  Net 1313.85 ml   Filed Weights   03/08/15 2300 03/10/15 1600  Weight: 203 lb 11.2 oz (92.398 kg) 204 lb 12.9 oz (92.9 kg)    PHYSICAL EXAM General: Well developed, well nourished, pleasant male in no acute distress. Head: Normocephalic, atraumatic.  Neck: Supple without bruits, JVD not elevated. Lungs:  Resp regular and unlabored, CTA without wheezing or rales. Heart: RRR, S1, S2, no S3, S4, or murmur; no rub. Abdomen: Soft, non-tender, non-distended, BS + x 4. Midline surgical incision. Extremities: No clubbing, no cyanosis, no edema. Distal pulses 2+. Neuro: Alert and oriented X 3. Moves all extremities spontaneously. Psych: Normal affect.  LABS: CBC: Recent Labs  03/08/15 1528  03/10/15 1615 03/11/15 0500  WBC 11.1*  < > 9.1 6.6  NEUTROABS 9.7*  --  6.6  --   HGB 15.8  < > 10.5* 9.1*  HCT 46.1  < > 32.2* 27.8*  MCV 89.7  < > 92.5 93.3    PLT 199  < > 144* 120*  < > = values in this interval not displayed. INR: Recent Labs  03/08/15 1954  INR 0.93   Basic Metabolic Panel: Recent Labs  03/10/15 1307 03/10/15 1615  NA 143 144  K 3.3* 3.5  CL 106 107  CO2 30 30  GLUCOSE 164* 121*  BUN 19 18  CREATININE 1.22 1.05  CALCIUM 8.4* 8.3*   Liver Function Tests: Recent Labs  03/08/15 1528  AST 25  ALT 24  ALKPHOS 56  BILITOT 0.8  PROT 7.3  ALBUMIN 4.0   Cardiac Enzymes: Recent Labs  03/10/15 1615  CKTOTAL 257  CKMB 5.1*  TROPONINI 0.10*   Thyroid Function Tests: Recent Labs  03/10/15 2047  TSH 3.313   TELE:  In atrial fibrillation with rate in the 80's - 90's.       Current Medications:  . antiseptic oral rinse  7 mL Mouth Rinse BID  . metoprolol  2 mg Intravenous 4 times per day  . ondansetron  4 mg Intravenous Once  . pantoprazole (PROTONIX) IV  40 mg Intravenous QHS   . sodium chloride Stopped (03/10/15 2100)  . dextrose 5 % and 0.9 % NaCl with KCl 20 mEq/L 50 mL/hr at 03/11/15 0816  . diltiazem (CARDIZEM) infusion 5 mg/hr (  03/11/15 0700)  . heparin 950 Units/hr (03/11/15 0700)    ASSESSMENT AND PLAN:  1. Atrial fibrillation with rapid ventricular response - Unknown duration of a.fib. Last EKG was in 2010 prior to this episode. Cannot confirm patient has been in a.fib for less than 48 hours. The last time he was on the monitor would have been during evacuation of hematoma on 08/06.  - Safest option is TEE guided cardioversion - Continue cardiazem drip. Consider conversion to oral once patient able to take PO.  - Continue heparin per pharmacy dosage. - OK with cards for Surgery to send pt to step-down. - He has no awareness of afib, so will need to keep anticoagulated going forward  Signed, Rosaria Ferries , PA-C 12:35 PM 03/11/2015    Patient seen and examined. Agree with assessment and plan. No chest pain. Pt is in AF of ~ 48-72 hrs duration, although uncertain. He had an ECG at  Dr. Bethanne Ginger office on 03/07/2015 and was not told of abnormality. NSR with sinus arythmia on that ECG in Care Everywhere. He has had 2 surgeries this weekend. No chest pain. Currently on heparin; will plan for TEE guided cardioversion tomorrow.  Currently on iv lopressor; to start oral when ok to take orally.  OK to transfer to stepdown/telemetry.   Troy Sine, MD, [facility] 03/11/2015 1:38 PM

## 2015-03-11 NOTE — Progress Notes (Signed)
ANTICOAGULATION CONSULT NOTE - Follow Up Consult  Pharmacy Consult for Heparin  Indication: atrial fibrillation  Allergies  Allergen Reactions  . Statins    Patient Measurements: Height: 5\' 6"  (167.6 cm) Weight: 204 lb 12.9 oz (92.9 kg) IBW/kg (Calculated) : 63.8  Vital Signs: Temp: 98.5 F (36.9 C) (08/08 0408) Temp Source: Oral (08/08 0408) BP: 106/68 mmHg (08/08 0530) Pulse Rate: 72 (08/08 0530)  Labs:  Recent Labs  03/08/15 1528 03/08/15 1954 03/10/15 1307 03/10/15 1615 03/11/15 0500  HGB 15.8  --  11.1* 10.5* 9.1*  HCT 46.1  --  33.5* 32.2* 27.8*  PLT 199  --  170 144* 120*  APTT  --  25  --   --   --   LABPROT  --  15.4*  --   --   --   INR  --  1.20  --   --   --   HEPARINUNFRC  --   --   --   --  0.30  CREATININE 1.38*  --  1.22 1.05  --   CKTOTAL  --   --   --  257  --   CKMB  --   --   --  5.1*  --   TROPONINI  --   --   --  0.10*  --     Estimated Creatinine Clearance: 67.8 mL/min (by C-G formula based on Cr of 1.05).  Assessment: Initial heparin level is therapeutic, lower goal requested by MD  Goal of Therapy:  Heparin level 0.3-0.5 units/ml Monitor platelets by anticoagulation protocol: Yes   Plan:  -Continue heparin drip at 950 units/hr -1200 confirmatory HL  -Daily CBC/HL -Monitor for bleeding  Narda Bonds 03/11/2015,6:06 AM

## 2015-03-11 NOTE — Progress Notes (Signed)
2 Days Post-Op  Subjective: Looks well no CP  Abdomen sore   Objective: Vital signs in last 24 hours: Temp:  [97.8 F (36.6 C)-98.6 F (37 C)] 98.5 F (36.9 C) (08/08 0408) Pulse Rate:  [41-162] 89 (08/08 0800) Resp:  [16-30] 22 (08/08 0800) BP: (105-142)/(58-100) 115/67 mmHg (08/08 0800) SpO2:  [84 %-99 %] 98 % (08/08 0800) FiO2 (%):  [2 %] 2 % (08/07 1600) Weight:  [92.9 kg (204 lb 12.9 oz)] 92.9 kg (204 lb 12.9 oz) (08/07 1600) Last BM Date: 03/08/15  Intake/Output from previous day: 08/07 0701 - 08/08 0700 In: 2647.5 [P.O.:60; I.V.:2287.5; IV Piggyback:300] Out: 1055 [Urine:900; Drains:155] Intake/Output this shift: Total I/O In: 114.5 [I.V.:114.5] Out: 225 [Urine:225]  Cardio: a fib rate 80 Incision/Wound:incision intact  Ecchymosis in place lower abdomen soft no hematoma JP SEROUS   Lab Results:   Recent Labs  03/10/15 1615 03/11/15 0500  WBC 9.1 6.6  HGB 10.5* 9.1*  HCT 32.2* 27.8*  PLT 144* 120*   BMET  Recent Labs  03/10/15 1307 03/10/15 1615  NA 143 144  K 3.3* 3.5  CL 106 107  CO2 30 30  GLUCOSE 164* 121*  BUN 19 18  CREATININE 1.22 1.05  CALCIUM 8.4* 8.3*   PT/INR  Recent Labs  03/08/15 1954  LABPROT 15.4*  INR 1.20   ABG No results for input(s): PHART, HCO3 in the last 72 hours.  Invalid input(s): PCO2, PO2  Studies/Results: No results found.  Anti-infectives: Anti-infectives    Start     Dose/Rate Route Frequency Ordered Stop   03/08/15 1945  ceFAZolin (ANCEF) IVPB 2 g/50 mL premix     2 g 100 mL/hr over 30 Minutes Intravenous  Once 03/08/15 1930 03/08/15 2017      Assessment/Plan: s/p Procedure(s): EXPLORATORY LAPAROTOMY AND EVACUATION OF HEMATOMA (N/A) Await cardiology input for today Can have clears once work up complete To floor once ok with cardiology OOB  Keep drain in for now On heparin  LOS: 3 days    Pearlene Teat A. 03/11/2015

## 2015-03-12 ENCOUNTER — Ambulatory Visit (HOSPITAL_COMMUNITY): Payer: PPO

## 2015-03-12 DIAGNOSIS — I4891 Unspecified atrial fibrillation: Secondary | ICD-10-CM

## 2015-03-12 LAB — COMPREHENSIVE METABOLIC PANEL
ALT: 36 U/L (ref 17–63)
AST: 38 U/L (ref 15–41)
Albumin: 2.7 g/dL — ABNORMAL LOW (ref 3.5–5.0)
Alkaline Phosphatase: 40 U/L (ref 38–126)
Anion gap: 6 (ref 5–15)
BUN: 11 mg/dL (ref 6–20)
CO2: 28 mmol/L (ref 22–32)
Calcium: 7.8 mg/dL — ABNORMAL LOW (ref 8.9–10.3)
Chloride: 108 mmol/L (ref 101–111)
Creatinine, Ser: 0.78 mg/dL (ref 0.61–1.24)
GFR calc Af Amer: >60 mL/min (ref >60)
GFR calc non Af Amer: >60 mL/min (ref >60)
Glucose, Bld: 111 mg/dL — ABNORMAL HIGH (ref 65–99)
Potassium: 3.2 mmol/L — ABNORMAL LOW (ref 3.5–5.1)
Sodium: 142 mmol/L (ref 135–145)
Total Bilirubin: 1.1 mg/dL (ref 0.3–1.2)
Total Protein: 5.4 g/dL — ABNORMAL LOW (ref 6.5–8.1)

## 2015-03-12 LAB — CBC
HCT: 30.4 % — ABNORMAL LOW (ref 39.0–52.0)
Hemoglobin: 10.1 g/dL — ABNORMAL LOW (ref 13.0–17.0)
MCH: 30.5 pg (ref 26.0–34.0)
MCHC: 33.2 g/dL (ref 30.0–36.0)
MCV: 91.8 fL (ref 78.0–100.0)
Platelets: 136 10*3/uL — ABNORMAL LOW (ref 150–400)
RBC: 3.31 MIL/uL — ABNORMAL LOW (ref 4.22–5.81)
RDW: 13.9 % (ref 11.5–15.5)
WBC: 6.8 10*3/uL (ref 4.0–10.5)

## 2015-03-12 LAB — HEPARIN LEVEL (UNFRACTIONATED)
Heparin Unfractionated: 0.27 IU/mL — ABNORMAL LOW (ref 0.30–0.70)
Heparin Unfractionated: 0.23 IU/mL — ABNORMAL LOW (ref 0.30–0.70)

## 2015-03-12 MED ORDER — ACETAMINOPHEN 500 MG PO TABS: 1000.0000 mg | ORAL_TABLET | Freq: Four times a day (QID) | ORAL | Status: DC | PRN

## 2015-03-12 MED ORDER — SODIUM CHLORIDE 0.9 % IJ SOLN
3.0000 mL | Freq: Two times a day (BID) | INTRAMUSCULAR | Status: DC
  Administered 2015-03-12 – 2015-03-13 (×3): 3 mL via INTRAVENOUS

## 2015-03-12 MED ORDER — SODIUM CHLORIDE 0.9 % IJ SOLN
3.0000 mL | INTRAMUSCULAR | Status: DC | PRN
  Administered 2015-03-13: 3 mL via INTRAVENOUS
  Filled 2015-03-12: qty 3

## 2015-03-12 MED ORDER — METOPROLOL TARTRATE 25 MG PO TABS
25.0000 mg | ORAL_TABLET | Freq: Two times a day (BID) | ORAL | Status: DC
  Administered 2015-03-12 – 2015-03-13 (×4): 25 mg via ORAL
  Filled 2015-03-12 (×5): qty 1

## 2015-03-12 MED ORDER — POTASSIUM CHLORIDE CRYS ER 20 MEQ PO TBCR
40.0000 meq | EXTENDED_RELEASE_TABLET | Freq: Two times a day (BID) | ORAL | Status: AC
  Administered 2015-03-12 (×2): 40 meq via ORAL
  Filled 2015-03-12 (×2): qty 2

## 2015-03-12 MED ORDER — SODIUM CHLORIDE 0.9 % IV SOLN: 250.0000 mL | INTRAVENOUS | Status: DC

## 2015-03-12 MED ORDER — OXYCODONE-ACETAMINOPHEN 5-325 MG PO TABS: 1.0000 | ORAL_TABLET | ORAL | Status: DC | PRN

## 2015-03-12 NOTE — Progress Notes (Signed)
Patient Name: Jim Dodson Date of Encounter: 03/12/2015  Primary Cardiologist: Bartholome Bill MD [facility], [place])  Patient Profile: 74 yo male with PMH of CAD (PCI, 2010) HTN and abdominal hernia admitted on 03/08/15 for SBO 2ry to ventral hernia. Underwent surgery on 03/08/15 followed by second surgery on 03/09/15 for evacuation of hematoma. Went into Afib w/RVR on 03/10/15.    Active Problems:   SBO (small bowel obstruction)   Atrial fibrillation with rapid ventricular response   Chronic coronary artery disease   Elevated troponin   Patient receiving intravenous heparin therapy    SUBJECTIVE  Denies any CP or SOB. Tolerating liquid diet, no N/V. No significant abdominal discomfort. Passing gas  CURRENT MEDS . antiseptic oral rinse  7 mL Mouth Rinse BID  . metoprolol  2 mg Intravenous 4 times per day  . ondansetron  4 mg Intravenous Once  . pantoprazole (PROTONIX) IV  40 mg Intravenous QHS  . potassium chloride  40 mEq Oral BID   . sodium chloride Stopped (03/10/15 2100)  . sodium chloride    . dextrose 5 % and 0.9 % NaCl with KCl 20 mEq/L 50 mL/hr at 03/11/15 2206  . heparin 1,100 Units/hr (03/12/15 0828)   at 1100 u/hr. OBJECTIVE  Filed Vitals:   03/12/15 0813 03/12/15 0900 03/12/15 1000 03/12/15 1100  BP:  106/92 140/84 154/106  Pulse:  56 92 89  Temp: 98.9 F (37.2 C)     TempSrc: Oral     Resp:  18 18 21   Height:      Weight:      SpO2:  99% 99% 100%    Intake/Output Summary (Last 24 hours) at 03/12/15 1144 Last data filed at 03/12/15 1100  Gross per 24 hour  Intake 2871.8 ml  Output   1120 ml  Net 1751.8 ml   Filed Weights   03/08/15 2300 03/10/15 1600 03/12/15 0500  Weight: 203 lb 11.2 oz (92.398 kg) 204 lb 12.9 oz (92.9 kg) 205 lb 4 oz (93.1 kg)    PHYSICAL EXAM  General: Pleasant, NAD. Neuro: Alert and oriented X 3. Moves all extremities spontaneously. Psych: Normal affect. HEENT:  Normal  Neck: Supple without bruits or JVD. Lungs:  Resp  regular and unlabored, CTA. Heart: RRR no s3, s4, or murmurs. Abdomen: +bowel sound.  Extremities: No clubbing, cyanosis or edema. DP/PT/Radials 2+ and equal bilaterally.  Accessory Clinical Findings  CBC  Recent Labs  03/10/15 1615 03/11/15 0500 03/12/15 0305  WBC 9.1 6.6 6.8  NEUTROABS 6.6  --   --   HGB 10.5* 9.1* 10.1*  HCT 32.2* 27.8* 30.4*  MCV 92.5 93.3 91.8  PLT 144* 120* 468*   Basic Metabolic Panel  Recent Labs  03/10/15 1615 03/12/15 0305  NA 144 142  K 3.5 3.2*  CL 107 108  CO2 30 28  GLUCOSE 121* 111*  BUN 18 11  CREATININE 1.05 0.78  CALCIUM 8.3* 7.8*   Liver Function Tests  Recent Labs  03/12/15 0305  AST 38  ALT 36  ALKPHOS 40  BILITOT 1.1  PROT 5.4*  ALBUMIN 2.7*   Cardiac Enzymes  Recent Labs  03/10/15 1615  CKTOTAL 257  CKMB 5.1*  TROPONINI 0.10*   Thyroid Function Tests  Recent Labs  03/10/15 2047  TSH 3.313    TELE A-fib with HR 80-90s    ECG  No new EKG  Echocardiogram 03/12/2015  Left ventricle: The cavity size was normal. Systolic function was normal. The estimated  ejection fraction was in the range of 60% to 65%. Images were inadequate for LV wall motion assessment. The study was not technically sufficient to allow evaluation of LV diastolic dysfunction due to atrial fibrillation. - Aortic valve: Trileaflet; normal thickness, mildly calcified leaflets. There was mild regurgitation. - Mitral valve: There was trivial regurgitation.     Radiology/Studies  Ct Abdomen Pelvis Wo Contrast  03/08/2015   CLINICAL DATA:  Acute onset of periumbilical abdominal pain. Vomiting and nausea. Initial encounter.  EXAM: CT ABDOMEN AND PELVIS WITHOUT CONTRAST  TECHNIQUE: Multidetector CT imaging of the abdomen and pelvis was performed following the standard protocol without IV contrast.  COMPARISON:  CT of the abdomen and pelvis performed 12/22/2011, and renal ultrasound performed 07/20/2011  FINDINGS: The visualized  lung bases are clear. Scattered coronary artery calcifications are seen.  A tiny calcified granuloma is noted at the left hepatic lobe. A small vague 8 mm hypodensity within the left hepatic lobe may reflect a small cyst, grossly stable from 2013. The spleen is unremarkable in appearance. The gallbladder is within normal limits.  A 2.5 cm fat containing right adrenal adenoma is noted. The left adrenal gland is unremarkable. The pancreas is within normal limits.  Vaguely abnormal contour of the left kidney near its lower pole, with minimal adjacent calcification, appears to reflect a cyst, on correlation with prior CT from 2013. Nonspecific perinephric stranding is noted bilaterally. A few nonobstructing right renal stones are seen, measuring up to 5 mm in size. There is no evidence of hydronephrosis. No obstructing ureteral stones are identified.  Several anterior abdominal wall hernias are noted along the mid abdominal wall, just superior to the umbilicus, with a small umbilical hernia also seen. There is herniation of a short 4 cm segment of proximal ileum into a right-sided anterior abdominal wall hernia, resulting in relatively high-grade small bowel obstruction. There is dilatation of small-bowel loops to 3.8 cm in maximal diameter.  Trace free fluid is seen within the pelvis. There is decompression of all small bowel loops distal to the herniation. Residual stool is noted within the colon. Scattered diverticulosis is noted along the sigmoid colon, without definite evidence of diverticulitis.  The stomach is within normal limits. No acute vascular abnormalities are seen. Relatively diffuse calcification is noted along the abdominal aorta and its branches.  The appendix is decompressed.  There is no evidence of appendicitis.  The bladder is mildly distended and grossly unremarkable. The prostate remains normal in size, with scattered calcification. A moderate left inguinal hernia is seen, containing trace fluid.  No inguinal lymphadenopathy is seen.  No acute osseous abnormalities are identified. Facet disease is noted at the lower lumbar spine. Endplate sclerotic change is seen at T10-T11.  IMPRESSION: 1. Focal herniation of a short 4 cm segment of proximal ileum into a right-sided anterior abdominal wall hernia, resulting in relatively high-grade small bowel obstruction. Dilatation of small-bowel loops to 3.8 cm in maximal diameter, trace free fluid in the pelvis. Decompression of small bowel loops distal to the herniation. 2. Multiple anterior abdominal wall hernias seen at the mid abdominal wall, just superior to the umbilicus, with a small umbilical hernia also seen. Moderate left inguinal hernia noted, containing trace fluid. 3. Scattered coronary artery calcifications seen. 4. Tiny hepatic cyst. Apparent left renal cyst. Few nonobstructing right renal stones measure up to 5 mm in size. 5. 2.5 cm fat containing right adrenal adenoma noted. 6. Scattered diverticulosis along the sigmoid colon, without evidence of diverticulitis. 7.  Relatively diffuse calcification along the abdominal aorta and its branches. These results were called by telephone at the time of interpretation on 03/08/2015 at 6:46 pm to Dr. Ezequiel Essex, who verbally acknowledged these results.   Electronically Signed   By: Garald Balding M.D.   On: 03/08/2015 18:48    ASSESSMENT AND PLAN  1. Atrial fibrillation with rapid ventricular response  - Unknown duration of a.fib. Last EKG was in 2010 prior to this episode. Cannot confirm patient has been in a.fib for less than 48 hours. The last time he was on the monitor would have been during evacuation of hematoma on 08/06.   - CHA2DS2-Vasc score 2 (age, HTN, CAD). ASA and plavix held  - Echo 03/12/2015 EF 60-65%  - plan for TEE DCCV tomorrow at 9am with Dr. Marlou Porch  - continue IV heparin, as for systemic anticoagulation, given recent surgery and hematoma, would like to avoid systemic  anticoagulation, will discuss with MD, a-fib may have occurred in the setting of acute stress  - tolerating liquid diet, will switch to PO metoprolol, d/c IV metoprolol and IV diltiazem  2. CAD s/p PCI in 2010   Signed, Meng, Hao PA-C Pager: [phone]  Patient seen and examined. Agree with assessment and plan. [facility] reveals EF 60 - 65%, insufficient quality to assess wall motion. Normal bi-atrial size.  AF rate controlled, on heparin at 1100 units/hr. Plan TEE tomorrow. No chest pain or SOB. Still draining from JP drain with serosanguinous output.    Troy Sine, MD, [facility] 03/12/2015 4:50 PM

## 2015-03-12 NOTE — Care Management Important Message (Signed)
Important Message  Patient Details  Name: Jim Dodson MRN: [id number] Date of Birth: Oct 07, 1940   Medicare Important Message Given:  [facility] notification given    Nathen May 03/12/2015, 10:38 AMImportant Message  Patient Details  Name: Jim Dodson MRN: [id number] Date of Birth: [birth year]   Medicare Important Message Given:  Yes-second notification given    Nathen May 03/12/2015, 10:38 AM

## 2015-03-12 NOTE — Progress Notes (Signed)
  Echocardiogram 2D Echocardiogram has been performed.  Johny Chess 03/12/2015, 10:02 AM

## 2015-03-12 NOTE — Progress Notes (Signed)
Patient ID: Jim Dodson, male   DOB: 05-Nov-1940, 74 y.o.   MRN: [id number] 3 Days Post-Op  Subjective: Patient feels well today.  No complaints.  Very happy with his experience so far.  Lots of flatus, no BM  Objective: Vital signs in last 24 hours: Temp:  [98 F (36.7 C)-98.9 F (37.2 C)] 98.9 F (37.2 C) (08/09 0813) Pulse Rate:  [78-108] 78 (08/09 0800) Resp:  [16-28] 24 (08/09 0800) BP: (115-161)/(67-113) 158/92 mmHg (08/09 0800) SpO2:  [93 %-100 %] 98 % (08/09 0800) Weight:  [93.1 kg (205 lb 4 oz)] 93.1 kg (205 lb 4 oz) (08/09 0500) Last BM Date: 03/08/15  Intake/Output from previous day: 08/08 0701 - 08/09 0700 In: 2931.3 [P.O.:1320; I.V.:1611.3] Out: 1220 [Urine:1125; Drains:95] Intake/Output this shift: Total I/O In: 64.5 [I.V.:64.5] Out: 325 [Urine:325]  PE: Abd: soft, appropriately tender, +BS, JP drain with serosang output, wound intact with staples, ecchymosis present around incisions Heart: irregular  Lab Results:   Recent Labs  03/11/15 0500 03/12/15 0305  WBC 6.6 6.8  HGB 9.1* 10.1*  HCT 27.8* 30.4*  PLT 120* 136*   BMET  Recent Labs  03/10/15 1615 03/12/15 0305  NA 144 142  K 3.5 3.2*  CL 107 108  CO2 30 28  GLUCOSE 121* 111*  BUN 18 11  CREATININE 1.05 0.78  CALCIUM 8.3* 7.8*   PT/INR No results for input(s): LABPROT, INR in the last 72 hours. CMP     Component Value Date/Time   NA 142 03/12/2015 0305   K 3.2* 03/12/2015 0305   CL 108 03/12/2015 0305   CO2 28 03/12/2015 0305   GLUCOSE 111* 03/12/2015 0305   BUN 11 03/12/2015 0305   CREATININE 0.78 03/12/2015 0305   CALCIUM 7.8* 03/12/2015 0305   PROT 5.4* 03/12/2015 0305   ALBUMIN 2.7* 03/12/2015 0305   AST 38 03/12/2015 0305   ALT 36 03/12/2015 0305   ALKPHOS 40 03/12/2015 0305   BILITOT 1.1 03/12/2015 0305   GFRNONAA >60 03/12/2015 0305   GFRAA >60 03/12/2015 0305   Lipase     Component Value Date/Time   LIPASE 34 03/08/2015 1528       Studies/Results: No  results found.  Anti-infectives: Anti-infectives    Start     Dose/Rate Route Frequency Ordered Stop   03/08/15 1945  ceFAZolin (ANCEF) IVPB 2 g/50 mL premix     2 g 100 mL/hr over 30 Minutes Intravenous  Once 03/08/15 1930 03/08/15 2017       Assessment/Plan   POD 4,3, s/p ex lap with ventral hernia repair and take back for evacuation of hematoma -patient doing well. -advance to full liquids, NPO p MN for TEE cardioversion tomorrow -would like to mobilize more, but his heart rate goes up to 150 just getting up to the chair -oral pain meds A fib -cards planning on TEE with cardioversion tomorrow DVT prophylaxis -SCDs/ heparin drip  TO SDU when bed available  LOS: 4 days    Montario Zilka E 03/12/2015, 10:36 AM Pager: [phone]

## 2015-03-12 NOTE — Progress Notes (Signed)
[place] for heparin Indication: atrial fibrillation  Allergies  Allergen Reactions  . Statins     Patient Measurements: Height: 5\' 6"  (167.6 cm) Weight: 205 lb 4 oz (93.1 kg) IBW/kg (Calculated) : 63.8 Heparin Dosing Weight: 72kg  Vital Signs: Temp: 98.7 F (37.1 C) (08/09 1548) Temp Source: Oral (08/09 1548) BP: 147/80 mmHg (08/09 1500) Pulse Rate: 86 (08/09 1500)  Labs:  Recent Labs  03/10/15 1307 03/10/15 1615  03/11/15 0500 03/11/15 1202 03/12/15 0305 03/12/15 0500 03/12/15 1626  HGB 11.1* 10.5*  --  9.1*  --  10.1*  --   --   HCT 33.5* 32.2*  --  27.8*  --  30.4*  --   --   PLT 170 144*  --  120*  --  136*  --   --   HEPARINUNFRC  --   --   < > 0.30 0.31  --  0.27* 0.23*  CREATININE 1.22 1.05  --   --   --  0.78  --   --   CKTOTAL  --  257  --   --   --   --   --   --   CKMB  --  5.1*  --   --   --   --   --   --   TROPONINI  --  0.10*  --   --   --   --   --   --   < > = values in this interval not displayed.  Estimated Creatinine Clearance: 89.1 mL/min (by C-G formula based on Cr of 0.78).   Medical History: Past Medical History  Diagnosis Date  . Hypertension     Assessment: 8 YOM with no CAD history who is s/p surgery for ventral hernia on 8/6. Today he developedAFib with RVR.  Heparin level still sub-therapeutic this PM  Goal of Therapy:  Heparin level 0.3-0.5 (correlates with aPTT 55-75 as requested by MD) units/ml Monitor platelets by anticoagulation protocol: Yes   Plan:  -Increase heparin to 1300 units/hr -daily HL and CBC -follow for s/sx bleeding -Planning for DCCV  Thank you. Anette Guarneri, PharmD [phone] 03/12/2015 6:01 PM

## 2015-03-12 NOTE — Progress Notes (Signed)
[place] for heparin Indication: atrial fibrillation  Allergies  Allergen Reactions  . Statins     Patient Measurements: Height: 5\' 6"  (167.6 cm) Weight: 205 lb 4 oz (93.1 kg) IBW/kg (Calculated) : 63.8 Heparin Dosing Weight: 72kg  Vital Signs: Temp: 98.9 F (37.2 C) (08/09 0813) Temp Source: Oral (08/09 0813) BP: 158/92 mmHg (08/09 0800) Pulse Rate: 78 (08/09 0800)  Labs:  Recent Labs  03/10/15 1307 03/10/15 1615 03/11/15 0500 03/11/15 1202 03/12/15 0305 03/12/15 0500  HGB 11.1* 10.5* 9.1*  --  10.1*  --   HCT 33.5* 32.2* 27.8*  --  30.4*  --   PLT 170 144* 120*  --  136*  --   HEPARINUNFRC  --   --  0.30 0.31  --  0.27*  CREATININE 1.22 1.05  --   --  0.78  --   CKTOTAL  --  257  --   --   --   --   CKMB  --  5.1*  --   --   --   --   TROPONINI  --  0.10*  --   --   --   --     Estimated Creatinine Clearance: 89.1 mL/min (by C-G formula based on Cr of 0.78).   Medical History: Past Medical History  Diagnosis Date  . Hypertension     Assessment: 20 YOM with no CAD history who is s/p surgery for ventral hernia on 8/6. Today he developedAFib with RVR. HL dropped, slightly SUBtherapeutic this morning. On no anticoagulants PTA. hgb down to 10.1, plts 136. No s/sx bleeding. Will aim for low goal per MD request.   Goal of Therapy:  Heparin level 0.3-0.5 (correlates with aPTT 55-75 as requested by MD) units/ml Monitor platelets by anticoagulation protocol: Yes   Plan:  -Increase heparin to 1100 units/hr -daily HL and CBC -follow for s/sx bleeding -Planning for [place], PharmD, BCPS Clinical Pharmacist Pager: [phone] 03/12/2015 8:28 AM

## 2015-03-13 ENCOUNTER — Inpatient Hospital Stay (HOSPITAL_COMMUNITY): Payer: PPO | Admitting: Certified Registered Nurse Anesthetist

## 2015-03-13 ENCOUNTER — Encounter (HOSPITAL_COMMUNITY): Payer: Self-pay

## 2015-03-13 ENCOUNTER — Inpatient Hospital Stay (HOSPITAL_COMMUNITY): Payer: PPO

## 2015-03-13 ENCOUNTER — Encounter (HOSPITAL_COMMUNITY): Admission: EM | Disposition: A | Discharge status: Home / Self Care | Payer: Self-pay | Source: Home / Self Care

## 2015-03-13 DIAGNOSIS — I340 Nonrheumatic mitral (valve) insufficiency: Secondary | ICD-10-CM

## 2015-03-13 HISTORY — PX: CARDIOVERSION: SHX1299

## 2015-03-13 HISTORY — PX: TEE WITHOUT CARDIOVERSION: SHX5443

## 2015-03-13 LAB — CBC
HCT: 31.1 % — ABNORMAL LOW (ref 39.0–52.0)
Hemoglobin: 10.2 g/dL — ABNORMAL LOW (ref 13.0–17.0)
MCH: 29.9 pg (ref 26.0–34.0)
MCHC: 32.8 g/dL (ref 30.0–36.0)
MCV: 91.2 fL (ref 78.0–100.0)
Platelets: 148 10*3/uL — ABNORMAL LOW (ref 150–400)
RBC: 3.41 MIL/uL — ABNORMAL LOW (ref 4.22–5.81)
RDW: 13.8 % (ref 11.5–15.5)
WBC: 6.4 10*3/uL (ref 4.0–10.5)

## 2015-03-13 LAB — GLUCOSE, CAPILLARY: Glucose-Capillary: 118 mg/dL — ABNORMAL HIGH (ref 65–99)

## 2015-03-13 LAB — HEPARIN LEVEL (UNFRACTIONATED): Heparin Unfractionated: 0.36 IU/mL (ref 0.30–0.70)

## 2015-03-13 SURGERY — ECHOCARDIOGRAM, TRANSESOPHAGEAL
Anesthesia: Monitor Anesthesia Care

## 2015-03-13 MED ORDER — PROPOFOL 10 MG/ML IV BOLUS
INTRAVENOUS | Status: DC | PRN
Start: 2015-03-13 — End: 2015-03-13
  Administered 2015-03-13: 40 mg via INTRAVENOUS

## 2015-03-13 MED ORDER — LACTATED RINGERS IV SOLN
INTRAVENOUS | Status: DC | PRN
  Administered 2015-03-13: 09:00:00 via INTRAVENOUS

## 2015-03-13 MED ORDER — BUTAMBEN-TETRACAINE-BENZOCAINE 2-2-14 % EX AERO
INHALATION_SPRAY | CUTANEOUS | Status: DC | PRN
  Administered 2015-03-13: 2 via TOPICAL

## 2015-03-13 MED ORDER — SODIUM CHLORIDE 0.9 % IV SOLN: INTRAVENOUS | Status: DC

## 2015-03-13 MED ORDER — PROPOFOL INFUSION 10 MG/ML OPTIME
INTRAVENOUS | Status: DC | PRN
  Administered 2015-03-13: 100 ug/kg/min via INTRAVENOUS

## 2015-03-13 NOTE — CV Procedure (Signed)
    Electrical Cardioversion Procedure Note Jim Dodson [id number] 07-23-1941  Procedure: Electrical Cardioversion Indications:  Atrial Fibrillation  Time Out: Verified patient identification, verified procedure,medications/allergies/relevent history reviewed, required imaging and test results available.  Performed  Procedure Details  The patient was NPO after midnight. Anesthesia was administered at the beside  by Dr.Fitzgerald with propofol.  Cardioversion was performed with synchronized biphasic defibrillation via AP pads with 120 joules.  1 attempt(s) were performed.  The patient converted to normal sinus rhythm. The patient tolerated the procedure well   IMPRESSION:  Successful cardioversion of atrial fibrillation Brief return to atrial fibrillation/ flutter for about 15 seconds about 1 minute post conversion then spontaneous return to normal sinus rhythm noted. Discussed with wife. May revert back to afib.   TEE: Normal EF, no LAA thrombus, mild MR, mild TR   Jim Dodson 03/13/2015, 9:32 AM

## 2015-03-13 NOTE — Progress Notes (Signed)
Patient Name: Jim Dodson Date of Encounter: 03/13/2015  Primary Cardiologist: Bartholome Bill MD [facility], [place])  Patient Profile: 74 yo male with PMH of CAD (PCI, 2010) HTN and abdominal hernia admitted on 03/08/15 for SBO 2ry to ventral hernia. Underwent surgery on 03/08/15 followed by second surgery on 03/09/15 for evacuation of hematoma. Went into Afib w/RVR on 03/10/15.    Active Problems:   SBO (small bowel obstruction)   Atrial fibrillation with rapid ventricular response   Chronic coronary artery disease   Elevated troponin   Patient receiving intravenous heparin therapy    SUBJECTIVE  Denies any CP or SOB. Tolerated liquid diet yesterday, starting solid food today. No N/V  CURRENT MEDS . antiseptic oral rinse  7 mL Mouth Rinse BID  . metoprolol tartrate  25 mg Oral BID  . ondansetron  4 mg Intravenous Once  . pantoprazole (PROTONIX) IV  40 mg Intravenous QHS  . sodium chloride  3 mL Intravenous Q12H   . sodium chloride Stopped (03/10/15 2100)  . sodium chloride    . dextrose 5 % and 0.9 % NaCl with KCl 20 mEq/L 50 mL/hr at 03/13/15 1019  . heparin 1,300 Units/hr (03/12/15 1802)   at 1100 u/hr. OBJECTIVE  Filed Vitals:   03/13/15 1100 03/13/15 1210 03/13/15 1222 03/13/15 1300  BP: 145/89 148/96  140/79  Pulse: 88 87  78  Temp:   97.9 F (36.6 C)   TempSrc:   Oral   Resp: 22 24  18   Height:      Weight:      SpO2: 99% 98%  100%    Intake/Output Summary (Last 24 hours) at 03/13/15 1337 Last data filed at 03/13/15 1300  Gross per 24 hour  Intake 2884.93 ml  Output   1930 ml  Net 954.93 ml   Filed Weights   03/12/15 0500 03/13/15 0348 03/13/15 0844  Weight: 205 lb 4 oz (93.1 kg) 205 lb 0.4 oz (93 kg) 205 lb (92.987 kg)    PHYSICAL EXAM  General: Pleasant, NAD. Neuro: Alert and oriented X 3. Moves all extremities spontaneously. Psych: Normal affect. HEENT:  Normal  Neck: Supple without bruits or JVD. Lungs:  Resp regular and unlabored, CTA. Heart:  RRR no s3, s4, or murmurs. Abdomen: +bowel sound. Dressing in place midline. RLQ drain in place Extremities: No clubbing, cyanosis or edema. DP/PT/Radials 2+ and equal bilaterally.  Accessory Clinical Findings  CBC  Recent Labs  03/10/15 1615  03/12/15 0305 03/13/15 0220  WBC 9.1  < > 6.8 6.4  NEUTROABS 6.6  --   --   --   HGB 10.5*  < > 10.1* 10.2*  HCT 32.2*  < > 30.4* 31.1*  MCV 92.5  < > 91.8 91.2  PLT 144*  < > 136* 148*  < > = values in this interval not displayed. Basic Metabolic Panel  Recent Labs  03/10/15 1615 03/12/15 0305  NA 144 142  K 3.5 3.2*  CL 107 108  CO2 30 28  GLUCOSE 121* 111*  BUN 18 11  CREATININE 1.05 0.78  CALCIUM 8.3* 7.8*   Liver Function Tests  Recent Labs  03/12/15 0305  AST 38  ALT 36  ALKPHOS 40  BILITOT 1.1  PROT 5.4*  ALBUMIN 2.7*   Cardiac Enzymes  Recent Labs  03/10/15 1615  CKTOTAL 257  CKMB 5.1*  TROPONINI 0.10*   Thyroid Function Tests  Recent Labs  03/10/15 2047  TSH 3.313    TELE  A-fib converted to NSR this morning    ECG  SVT vs a-fib with RVR this morning prior to DCCV  Echocardiogram 03/12/2015  Left ventricle: The cavity size was normal. Systolic function was normal. The estimated ejection fraction was in the range of 60% to 65%. Images were inadequate for LV wall motion assessment. The study was not technically sufficient to allow evaluation of LV diastolic dysfunction due to atrial fibrillation. - Aortic valve: Trileaflet; normal thickness, mildly calcified leaflets. There was mild regurgitation. - Mitral valve: There was trivial regurgitation.     Radiology/Studies  Ct Abdomen Pelvis Wo Contrast  03/08/2015   CLINICAL DATA:  Acute onset of periumbilical abdominal pain. Vomiting and nausea. Initial encounter.  EXAM: CT ABDOMEN AND PELVIS WITHOUT CONTRAST  TECHNIQUE: Multidetector CT imaging of the abdomen and pelvis was performed following the standard protocol without IV  contrast.  COMPARISON:  CT of the abdomen and pelvis performed 12/22/2011, and renal ultrasound performed 07/20/2011  FINDINGS: The visualized lung bases are clear. Scattered coronary artery calcifications are seen.  A tiny calcified granuloma is noted at the left hepatic lobe. A small vague 8 mm hypodensity within the left hepatic lobe may reflect a small cyst, grossly stable from 2013. The spleen is unremarkable in appearance. The gallbladder is within normal limits.  A 2.5 cm fat containing right adrenal adenoma is noted. The left adrenal gland is unremarkable. The pancreas is within normal limits.  Vaguely abnormal contour of the left kidney near its lower pole, with minimal adjacent calcification, appears to reflect a cyst, on correlation with prior CT from 2013. Nonspecific perinephric stranding is noted bilaterally. A few nonobstructing right renal stones are seen, measuring up to 5 mm in size. There is no evidence of hydronephrosis. No obstructing ureteral stones are identified.  Several anterior abdominal wall hernias are noted along the mid abdominal wall, just superior to the umbilicus, with a small umbilical hernia also seen. There is herniation of a short 4 cm segment of proximal ileum into a right-sided anterior abdominal wall hernia, resulting in relatively high-grade small bowel obstruction. There is dilatation of small-bowel loops to 3.8 cm in maximal diameter.  Trace free fluid is seen within the pelvis. There is decompression of all small bowel loops distal to the herniation. Residual stool is noted within the colon. Scattered diverticulosis is noted along the sigmoid colon, without definite evidence of diverticulitis.  The stomach is within normal limits. No acute vascular abnormalities are seen. Relatively diffuse calcification is noted along the abdominal aorta and its branches.  The appendix is decompressed.  There is no evidence of appendicitis.  The bladder is mildly distended and grossly  unremarkable. The prostate remains normal in size, with scattered calcification. A moderate left inguinal hernia is seen, containing trace fluid. No inguinal lymphadenopathy is seen.  No acute osseous abnormalities are identified. Facet disease is noted at the lower lumbar spine. Endplate sclerotic change is seen at T10-T11.  IMPRESSION: 1. Focal herniation of a short 4 cm segment of proximal ileum into a right-sided anterior abdominal wall hernia, resulting in relatively high-grade small bowel obstruction. Dilatation of small-bowel loops to 3.8 cm in maximal diameter, trace free fluid in the pelvis. Decompression of small bowel loops distal to the herniation. 2. Multiple anterior abdominal wall hernias seen at the mid abdominal wall, just superior to the umbilicus, with a small umbilical hernia also seen. Moderate left inguinal hernia noted, containing trace fluid. 3. Scattered coronary artery calcifications seen. 4. Tiny hepatic  cyst. Apparent left renal cyst. Few nonobstructing right renal stones measure up to 5 mm in size. 5. 2.5 cm fat containing right adrenal adenoma noted. 6. Scattered diverticulosis along the sigmoid colon, without evidence of diverticulitis. 7. Relatively diffuse calcification along the abdominal aorta and its branches. These results were called by telephone at the time of interpretation on 03/08/2015 at 6:46 pm to Dr. Ezequiel Essex, who verbally acknowledged these results.   Electronically Signed   By: Garald Balding M.D.   On: 03/08/2015 18:48    ASSESSMENT AND PLAN  1. Atrial fibrillation with rapid ventricular response  - Unknown duration of a.fib. Last EKG was in 2010 prior to this episode. Cannot confirm patient has been in a.fib for less than 48 hours. The last time he was on the monitor would have been during evacuation of hematoma on 08/06.   - CHA2DS2-Vasc score 2 (age, HTN, CAD). ASA and plavix held  - Echo 03/12/2015 EF 60-65%  - continue IV heparin, MD to decide if need  systemic anticoagulation vs ASA based on bleeding risk, a-fib may have occurred in the setting of acute stress  - tolerating liquid diet, will switch to PO metoprolol, d/c IV metoprolol and IV diltiazem  2. CAD s/p PCI in 2010   Signed, Meng, Hao PA-C Pager: [phone]   Patient seen and examined. Agree with assessment and plan. Pt underwent successful TEE cardioversion today with transient AF immediately afterward but returned to sinus rhythm  No LAA thrombus; NL EF. Sinus rate in upper 90's -100. Will increase metoprolol to 50 mg bid.   Troy Sine, MD, [facility] 03/13/2015 2:27 PM

## 2015-03-13 NOTE — Transfer of Care (Signed)
Immediate Anesthesia Transfer of Care Note  Patient: Jim Dodson  Procedure(s) Performed: Procedure(s): TRANSESOPHAGEAL ECHOCARDIOGRAM (TEE) (N/A) CARDIOVERSION (N/A)  Patient Location: Endoscopy Unit  Anesthesia Type:General  Level of Consciousness: awake, alert  and oriented  Airway & Oxygen Therapy: Patient Spontanous Breathing and Patient connected to nasal cannula oxygen  Post-op Assessment: Report given to RN, Post -op Vital signs reviewed and stable and Patient moving all extremities X 4  Post vital signs: Reviewed and stable  Last Vitals:  Filed Vitals:   03/13/15 0930  BP: 125/94  Pulse:   Temp:   Resp:     Complications: No apparent anesthesia complications

## 2015-03-13 NOTE — Anesthesia Preprocedure Evaluation (Addendum)
Anesthesia Evaluation  Patient identified by MRN, date of birth, ID band Patient awake    Reviewed: Allergy & Precautions, H&P , NPO status , Patient's Chart, lab work & pertinent test results, reviewed documented beta blocker date and time   Airway Mallampati: II  TM Distance: >3 FB Neck ROM: full    Dental  (+) Teeth Intact, Dental Advidsory Given   Pulmonary  breath sounds clear to auscultation        Cardiovascular hypertension, On Medications and Pt. on home beta blockers + CAD + dysrhythmias Atrial Fibrillation Rhythm:Irregular Rate:Tachycardia     Neuro/Psych    GI/Hepatic   Endo/Other    Renal/GU      Musculoskeletal   Abdominal   Peds  Hematology   Anesthesia Other Findings Left ventricle: The cavity size was normal. Systolic function was normal. The estimated ejection fraction was in the range of 60% to 65%. Images were inadequate for LV wall motion assessment. The study was not technically sufficient to allow evaluation of LV diastolic dysfunction due to atrial fibrillation. - Aortic valve: Trileaflet; normal thickness, mildly calcified leaflets. There was mild regurgitation. - Mitral valve: There was trivial regurgitation.  Reproductive/Obstetrics                          Anesthesia Physical Anesthesia Plan  ASA: III  Anesthesia Plan: MAC   Post-op Pain Management:    Induction: Intravenous  Airway Management Planned: Nasal Cannula  Additional Equipment:   Intra-op Plan:   Post-operative Plan:   Informed Consent: I have reviewed the patients History and Physical, chart, labs and discussed the procedure including the risks, benefits and alternatives for the proposed anesthesia with the patient or authorized representative who has indicated his/her understanding and acceptance.   Dental Advisory Given and Dental advisory given  Plan Discussed with:  Anesthesiologist, CRNA and Surgeon  Anesthesia Plan Comments:        Anesthesia Quick Evaluation

## 2015-03-13 NOTE — Progress Notes (Signed)
[facility] for heparin Indication: atrial fibrillation  Allergies  Allergen Reactions  . Statins     Patient Measurements: Height: 5\' 6"  (167.6 cm) Weight: 205 lb 4 oz (93.1 kg) IBW/kg (Calculated) : 63.8 Heparin Dosing Weight: 72kg  Vital Signs: Temp: 98.9 F (37.2 C) (08/09 1940) Temp Source: Oral (08/09 1940) BP: 130/83 mmHg (08/10 0300) Pulse Rate: 81 (08/10 0300)  Labs:  Recent Labs  03/10/15 1307 03/10/15 1615 03/11/15 0500  03/12/15 0305 03/12/15 0500 03/12/15 1626 03/13/15 0220  HGB 11.1* 10.5* 9.1*  --  10.1*  --   --  10.2*  HCT 33.5* 32.2* 27.8*  --  30.4*  --   --  31.1*  PLT 170 144* 120*  --  136*  --   --  148*  HEPARINUNFRC  --   --  0.30  < >  --  0.27* 0.23* 0.36  CREATININE 1.22 1.05  --   --  0.78  --   --   --   CKTOTAL  --  257  --   --   --   --   --   --   CKMB  --  5.1*  --   --   --   --   --   --   TROPONINI  --  0.10*  --   --   --   --   --   --   < > = values in this interval not displayed.  Estimated Creatinine Clearance: 89.1 mL/min (by C-G formula based on Cr of 0.78).  Assessment: 73 YOM on heparin for afib. Heparin level now therapeutic on 1300 units/hr. CBC stable. No bleeding noted. Plan TEE today.  Goal of Therapy:  Heparin level 0.3-0.5 (correlates with aPTT 55-75 as requested by MD) units/ml Monitor platelets by anticoagulation protocol: Yes   Plan:  -Continue heparin 1300 units/hr -Will f/u [facility] plans after TEE - if heparin continues - consider repeat heparin level to confirm therapeutic -Daily HL and CBC  Sherlon Handing, PharmD, BCPS Clinical pharmacist, pager [phone] 03/13/2015 3:44 AM

## 2015-03-13 NOTE — Anesthesia Postprocedure Evaluation (Signed)
  Anesthesia Post-op Note  Patient: Jim Dodson  Procedure(s) Performed: Procedure(s): TRANSESOPHAGEAL ECHOCARDIOGRAM (TEE) (N/A) CARDIOVERSION (N/A)  Patient Location: Endoscopy Unit  Anesthesia Type:General  Level of Consciousness: awake, alert  and oriented  Airway and Oxygen Therapy: Patient Spontanous Breathing and Patient connected to nasal cannula oxygen  Post-op Pain: none  Post-op Assessment: Post-op Vital signs reviewed, Patient's Cardiovascular Status Stable, Respiratory Function Stable, Patent Airway and No signs of Nausea or vomiting LLE Motor Response: Purposeful movement, Responds to commands   RLE Motor Response: Purposeful movement, Responds to commands        Post-op Vital Signs: Reviewed and stable  Last Vitals:  Filed Vitals:   03/13/15 0930  BP: 125/94  Pulse:   Temp:   Resp:     Complications: No apparent anesthesia complications

## 2015-03-13 NOTE — Progress Notes (Signed)
Patient ID: Jim Dodson, male   DOB: July 24, 1941, 74 y.o.   MRN: [id number] Day of Surgery  Subjective: Pt doing well s/p cardioversion.  Now in NSR.  + flatus, no nausea.  Tolerated fulls yesterday  Objective: Vital signs in last 24 hours: Temp:  [97.5 F (36.4 C)-98.9 F (37.2 C)] 98.3 F (36.8 C) (08/10 1010) Pulse Rate:  [74-163] 88 (08/10 1010) Resp:  [15-28] 25 (08/10 1010) BP: (104-161)/(57-115) 137/93 mmHg (08/10 1010) SpO2:  [94 %-100 %] 99 % (08/10 1010) Weight:  [92.987 kg (205 lb)-93 kg (205 lb 0.4 oz)] 92.987 kg (205 lb) (08/10 0844) Last BM Date: 03/08/15  Intake/Output from previous day: 08/09 0701 - 08/10 0700 In: 3617.6 [P.O.:2160; I.V.:1457.6] Out: 2155 [Urine:2075; Drains:80] Intake/Output this shift: Total I/O In: 300 [I.V.:300] Out: 275 [Urine:275]  PE: Abd: soft, diffuse ecchymosis, JP drain with old bloody serous drainage noted, +BS, minimal distention Heart: regular LungS: CTAB  Lab Results:   Recent Labs  03/12/15 0305 03/13/15 0220  WBC 6.8 6.4  HGB 10.1* 10.2*  HCT 30.4* 31.1*  PLT 136* 148*   BMET  Recent Labs  03/10/15 1615 03/12/15 0305  NA 144 142  K 3.5 3.2*  CL 107 108  CO2 30 28  GLUCOSE 121* 111*  BUN 18 11  CREATININE 1.05 0.78  CALCIUM 8.3* 7.8*   PT/INR No results for input(s): LABPROT, INR in the last 72 hours. CMP     Component Value Date/Time   NA 142 03/12/2015 0305   K 3.2* 03/12/2015 0305   CL 108 03/12/2015 0305   CO2 28 03/12/2015 0305   GLUCOSE 111* 03/12/2015 0305   BUN 11 03/12/2015 0305   CREATININE 0.78 03/12/2015 0305   CALCIUM 7.8* 03/12/2015 0305   PROT 5.4* 03/12/2015 0305   ALBUMIN 2.7* 03/12/2015 0305   AST 38 03/12/2015 0305   ALT 36 03/12/2015 0305   ALKPHOS 40 03/12/2015 0305   BILITOT 1.1 03/12/2015 0305   GFRNONAA >60 03/12/2015 0305   GFRAA >60 03/12/2015 0305   Lipase     Component Value Date/Time   LIPASE 34 03/08/2015 1528       Studies/Results: No results  found.  Anti-infectives: Anti-infectives    Start     Dose/Rate Route Frequency Ordered Stop   03/08/15 1945  ceFAZolin (ANCEF) IVPB 2 g/50 mL premix     2 g 100 mL/hr over 30 Minutes Intravenous  Once 03/08/15 1930 03/08/15 2017       Assessment/Plan   POD 5,4, s/p ex lap with ventral hernia repair and take back for evacuation of hematoma -advance to heart healthy diet -ambulate TID -oral pain meds A fib -now in NSR s/p TEE cardioversion DVT prophylaxis -SCDs/ heparin drip (will defer this to cards)  -transfer to tele  LOS: 5 days    Adaisha Campise E 03/13/2015, 11:22 AM Pager: [phone]

## 2015-03-13 NOTE — H&P (View-Only) (Signed)
Patient Name: Jim Dodson Date of Encounter: 03/12/2015  Primary Cardiologist: Bartholome Bill MD [facility], [place])  Patient Profile: 74 yo male with PMH of CAD (PCI, 2010) HTN and abdominal hernia admitted on 03/08/15 for SBO 2ry to ventral hernia. Underwent surgery on 03/08/15 followed by second surgery on 03/09/15 for evacuation of hematoma. Went into Afib w/RVR on 03/10/15.    Active Problems:   SBO (small bowel obstruction)   Atrial fibrillation with rapid ventricular response   Chronic coronary artery disease   Elevated troponin   Patient receiving intravenous heparin therapy    SUBJECTIVE  Denies any CP or SOB. Tolerating liquid diet, no N/V. No significant abdominal discomfort. Passing gas  CURRENT MEDS . antiseptic oral rinse  7 mL Mouth Rinse BID  . metoprolol  2 mg Intravenous 4 times per day  . ondansetron  4 mg Intravenous Once  . pantoprazole (PROTONIX) IV  40 mg Intravenous QHS  . potassium chloride  40 mEq Oral BID   . sodium chloride Stopped (03/10/15 2100)  . sodium chloride    . dextrose 5 % and 0.9 % NaCl with KCl 20 mEq/L 50 mL/hr at 03/11/15 2206  . heparin 1,100 Units/hr (03/12/15 0828)   at 1100 u/hr. OBJECTIVE  Filed Vitals:   03/12/15 0813 03/12/15 0900 03/12/15 1000 03/12/15 1100  BP:  106/92 140/84 154/106  Pulse:  56 92 89  Temp: 98.9 F (37.2 C)     TempSrc: Oral     Resp:  18 18 21   Height:      Weight:      SpO2:  99% 99% 100%    Intake/Output Summary (Last 24 hours) at 03/12/15 1144 Last data filed at 03/12/15 1100  Gross per 24 hour  Intake 2871.8 ml  Output   1120 ml  Net 1751.8 ml   Filed Weights   03/08/15 2300 03/10/15 1600 03/12/15 0500  Weight: 203 lb 11.2 oz (92.398 kg) 204 lb 12.9 oz (92.9 kg) 205 lb 4 oz (93.1 kg)    PHYSICAL EXAM  General: Pleasant, NAD. Neuro: Alert and oriented X 3. Moves all extremities spontaneously. Psych: Normal affect. HEENT:  Normal  Neck: Supple without bruits or JVD. Lungs:  Resp  regular and unlabored, CTA. Heart: RRR no s3, s4, or murmurs. Abdomen: +bowel sound.  Extremities: No clubbing, cyanosis or edema. DP/PT/Radials 2+ and equal bilaterally.  Accessory Clinical Findings  CBC  Recent Labs  03/10/15 1615 03/11/15 0500 03/12/15 0305  WBC 9.1 6.6 6.8  NEUTROABS 6.6  --   --   HGB 10.5* 9.1* 10.1*  HCT 32.2* 27.8* 30.4*  MCV 92.5 93.3 91.8  PLT 144* 120* 150*   Basic Metabolic Panel  Recent Labs  03/10/15 1615 03/12/15 0305  NA 144 142  K 3.5 3.2*  CL 107 108  CO2 30 28  GLUCOSE 121* 111*  BUN 18 11  CREATININE 1.05 0.78  CALCIUM 8.3* 7.8*   Liver Function Tests  Recent Labs  03/12/15 0305  AST 38  ALT 36  ALKPHOS 40  BILITOT 1.1  PROT 5.4*  ALBUMIN 2.7*   Cardiac Enzymes  Recent Labs  03/10/15 1615  CKTOTAL 257  CKMB 5.1*  TROPONINI 0.10*   Thyroid Function Tests  Recent Labs  03/10/15 2047  TSH 3.313    TELE A-fib with HR 80-90s    ECG  No new EKG  Echocardiogram 03/12/2015  Left ventricle: The cavity size was normal. Systolic function was normal. The estimated  ejection fraction was in the range of 60% to 65%. Images were inadequate for LV wall motion assessment. The study was not technically sufficient to allow evaluation of LV diastolic dysfunction due to atrial fibrillation. - Aortic valve: Trileaflet; normal thickness, mildly calcified leaflets. There was mild regurgitation. - Mitral valve: There was trivial regurgitation.     Radiology/Studies  Ct Abdomen Pelvis Wo Contrast  03/08/2015   CLINICAL DATA:  Acute onset of periumbilical abdominal pain. Vomiting and nausea. Initial encounter.  EXAM: CT ABDOMEN AND PELVIS WITHOUT CONTRAST  TECHNIQUE: Multidetector CT imaging of the abdomen and pelvis was performed following the standard protocol without IV contrast.  COMPARISON:  CT of the abdomen and pelvis performed 12/22/2011, and renal ultrasound performed 07/20/2011  FINDINGS: The visualized  lung bases are clear. Scattered coronary artery calcifications are seen.  A tiny calcified granuloma is noted at the left hepatic lobe. A small vague 8 mm hypodensity within the left hepatic lobe may reflect a small cyst, grossly stable from 2013. The spleen is unremarkable in appearance. The gallbladder is within normal limits.  A 2.5 cm fat containing right adrenal adenoma is noted. The left adrenal gland is unremarkable. The pancreas is within normal limits.  Vaguely abnormal contour of the left kidney near its lower pole, with minimal adjacent calcification, appears to reflect a cyst, on correlation with prior CT from 2013. Nonspecific perinephric stranding is noted bilaterally. A few nonobstructing right renal stones are seen, measuring up to 5 mm in size. There is no evidence of hydronephrosis. No obstructing ureteral stones are identified.  Several anterior abdominal wall hernias are noted along the mid abdominal wall, just superior to the umbilicus, with a small umbilical hernia also seen. There is herniation of a short 4 cm segment of proximal ileum into a right-sided anterior abdominal wall hernia, resulting in relatively high-grade small bowel obstruction. There is dilatation of small-bowel loops to 3.8 cm in maximal diameter.  Trace free fluid is seen within the pelvis. There is decompression of all small bowel loops distal to the herniation. Residual stool is noted within the colon. Scattered diverticulosis is noted along the sigmoid colon, without definite evidence of diverticulitis.  The stomach is within normal limits. No acute vascular abnormalities are seen. Relatively diffuse calcification is noted along the abdominal aorta and its branches.  The appendix is decompressed.  There is no evidence of appendicitis.  The bladder is mildly distended and grossly unremarkable. The prostate remains normal in size, with scattered calcification. A moderate left inguinal hernia is seen, containing trace fluid.  No inguinal lymphadenopathy is seen.  No acute osseous abnormalities are identified. Facet disease is noted at the lower lumbar spine. Endplate sclerotic change is seen at T10-T11.  IMPRESSION: 1. Focal herniation of a short 4 cm segment of proximal ileum into a right-sided anterior abdominal wall hernia, resulting in relatively high-grade small bowel obstruction. Dilatation of small-bowel loops to 3.8 cm in maximal diameter, trace free fluid in the pelvis. Decompression of small bowel loops distal to the herniation. 2. Multiple anterior abdominal wall hernias seen at the mid abdominal wall, just superior to the umbilicus, with a small umbilical hernia also seen. Moderate left inguinal hernia noted, containing trace fluid. 3. Scattered coronary artery calcifications seen. 4. Tiny hepatic cyst. Apparent left renal cyst. Few nonobstructing right renal stones measure up to 5 mm in size. 5. 2.5 cm fat containing right adrenal adenoma noted. 6. Scattered diverticulosis along the sigmoid colon, without evidence of diverticulitis. 7.  Relatively diffuse calcification along the abdominal aorta and its branches. These results were called by telephone at the time of interpretation on 03/08/2015 at 6:46 pm to Dr. Ezequiel Essex, who verbally acknowledged these results.   Electronically Signed   By: Garald Balding M.D.   On: 03/08/2015 18:48    ASSESSMENT AND PLAN  1. Atrial fibrillation with rapid ventricular response  - Unknown duration of a.fib. Last EKG was in 2010 prior to this episode. Cannot confirm patient has been in a.fib for less than 48 hours. The last time he was on the monitor would have been during evacuation of hematoma on 08/06.   - CHA2DS2-Vasc score 2 (age, HTN, CAD). ASA and plavix held  - Echo 03/12/2015 EF 60-65%  - plan for TEE DCCV tomorrow at 9am with Dr. Marlou Porch  - continue IV heparin, as for systemic anticoagulation, given recent surgery and hematoma, would like to avoid systemic  anticoagulation, will discuss with MD, a-fib may have occurred in the setting of acute stress  - tolerating liquid diet, will switch to PO metoprolol, d/c IV metoprolol and IV diltiazem  2. CAD s/p PCI in 2010   Signed, Meng, Hao PA-C Pager: [phone]  Patient seen and examined. Agree with assessment and plan. [facility] reveals EF 60 - 65%, insufficient quality to assess wall motion. Normal bi-atrial size.  AF rate controlled, on heparin at 1100 units/hr. Plan TEE tomorrow. No chest pain or SOB. Still draining from JP drain with serosanguinous output.    Troy Sine, MD, [facility] 03/12/2015 4:50 PM

## 2015-03-13 NOTE — Interval H&P Note (Signed)
History and Physical Interval Note:  03/13/2015 9:14 AM  Jim Dodson  has presented today for surgery, with the diagnosis of a fib  The various methods of treatment have been discussed with the patient and family. After consideration of risks, benefits and other options for treatment, the patient has consented to  Procedure(s): TRANSESOPHAGEAL ECHOCARDIOGRAM (TEE) (N/A) CARDIOVERSION (N/A) as a surgical intervention .  The patient's history has been reviewed, patient examined, no change in status, stable for surgery.  I have reviewed the patient's chart and labs.  Questions were answered to the patient's satisfaction.     SKAINS, MARK

## 2015-03-14 ENCOUNTER — Encounter (HOSPITAL_COMMUNITY): Payer: Self-pay | Admitting: Cardiology

## 2015-03-14 DIAGNOSIS — K436 Other and unspecified ventral hernia with obstruction, without gangrene: Secondary | ICD-10-CM | POA: Diagnosis present

## 2015-03-14 DIAGNOSIS — I480 Paroxysmal atrial fibrillation: Secondary | ICD-10-CM

## 2015-03-14 LAB — CBC
HCT: 28.9 % — ABNORMAL LOW (ref 39.0–52.0)
Hemoglobin: 9.6 g/dL — ABNORMAL LOW (ref 13.0–17.0)
MCH: 30.6 pg (ref 26.0–34.0)
MCHC: 33.2 g/dL (ref 30.0–36.0)
MCV: 92 fL (ref 78.0–100.0)
Platelets: 153 10*3/uL (ref 150–400)
RBC: 3.14 MIL/uL — ABNORMAL LOW (ref 4.22–5.81)
RDW: 13.9 % (ref 11.5–15.5)
WBC: 5.8 10*3/uL (ref 4.0–10.5)

## 2015-03-14 LAB — HEPARIN LEVEL (UNFRACTIONATED): Heparin Unfractionated: 0.25 IU/mL — ABNORMAL LOW (ref 0.30–0.70)

## 2015-03-14 MED ORDER — OXYCODONE-ACETAMINOPHEN 5-325 MG PO TABS: 1.0000 | ORAL_TABLET | Freq: Four times a day (QID) | ORAL | Status: DC | PRN

## 2015-03-14 MED ORDER — ACETAMINOPHEN 500 MG PO TABS: 1000.0000 mg | ORAL_TABLET | Freq: Four times a day (QID) | ORAL | Status: DC | PRN

## 2015-03-14 MED ORDER — METOPROLOL TARTRATE 50 MG PO TABS: 50.0000 mg | ORAL_TABLET | Freq: Two times a day (BID) | ORAL | Status: DC

## 2015-03-14 MED ORDER — METOPROLOL TARTRATE 50 MG PO TABS
50.0000 mg | ORAL_TABLET | Freq: Two times a day (BID) | ORAL | Status: DC
  Administered 2015-03-14: 50 mg via ORAL
  Filled 2015-03-14: qty 1

## 2015-03-14 NOTE — Discharge Summary (Signed)
[facility] Discharge Summary   Patient ID: Jim Dodson MRN: [id number] DOB/AGE: [birth year] 74 y.o.  Admit date: 03/08/2015 Discharge date: 03/14/2015  Admitting Diagnosis: SBO due to incarcerated ventral hernia  Discharge Diagnosis Patient Active Problem List   Diagnosis Date Noted  . Incarcerated ventral hernia 03/14/2015  . Atrial fibrillation with rapid ventricular response   . Chronic coronary artery disease   . Elevated troponin   . Patient receiving intravenous heparin therapy   . SBO (small bowel obstruction) 03/08/2015    Consultants Cardiology (Dr. Marlou Dodson, Dr. Claiborne Dodson)  Imaging: No results found.  Procedures Dr. Marlou Dodson (8/5-6/16) - Exploratory laparotomy with ventral hernia repair and take back for evacuation of hematoma Dr. Marlou Dodson (03/12/14) - Electrical Dodson for atrial fibrillation    Dodson Course:  74 year old white male with a h/o CAD s/p PCI in 2010 who presents to [facility] with 3 days of nausea and vomiting. For the last 3 days he has noticed a firm lump just to the right of his midline incision. He has a known ventral hernia but the lump that he is feeling is new. He denies any fevers or chills. He has been passing some flatus. He has a history of previous ventral hernia repair. He also has a history of congestive heart failure and coronary artery disease with a stent that was placed in his heart in 2010. He is on Plavix. He underwent a CT scan which does show a loop of small bowel incarcerated through a hernia of the abdominal wall which is also the site of obstruction.  Patient was admitted and underwent procedure listed above.  Tolerated procedure well initially, but developed a hematoma of the surgical wound and had to be taken back emergently for evacuation.  A bulb suction was left in the incision site.  He was stabilized and transferred to the ICU for close monitoring.  Blood thinners and pharmacologic DVT prophylaxis was held due to  risk of bleeding.  Post operatively he developed AFIB with RVR and elevated troponins, thus cardiology was consulted.  He was placed on Cardizem drip.  He went for Jim Dodson on 03/13/15 which was successful.  Hgb eventually stabilized and he was started on heparin but was still below his normal.    He was initially kept NPO, but then diet was advanced as tolerated.  He was switched from IV metoprolol to PO 50mg  BID.  On POD #5/6, the patient was voiding well, tolerating diet, ambulating well, pain well controlled, vital signs stable, incisions c/d/i and felt stable for discharge home.  After consulting with his operating surgeon he will have his bulb drain removed prior to discharge.  Patient will follow up in our office in 2-3 weeks and knows to call with questions or concerns.  He is recommended to discharge home on metoprolol 50mg  BID.  Talked to Dr. Ubaldo Dodson his cardiologist who recommended holding anticoagulation (Jim Dodson as recommended by the cardiology group here) until he see's him in the office on Wednesday.  He has an appointment on Tuesday for staple removal with Jim Dodson nurse.        Medication List    STOP taking these medications        aspirin EC 81 MG tablet     carvedilol 12.5 MG tablet  Commonly known as:  COREG     clopidogrel 75 MG tablet  Commonly known as:  PLAVIX     lisinopril-hydrochlorothiazide 20-25 MG per tablet  Commonly known as:  PRINZIDE,ZESTORETIC  TAKE these medications        acetaminophen 500 MG tablet  Commonly known as:  TYLENOL  Take 2 tablets (1,000 mg total) by mouth every 6 (six) hours as needed for moderate pain, fever or headache.     HYALURONIC ACID PO  Take 100 mg by mouth daily at 12 noon.     MAGNESIUM-POTASSIUM PO  Take 1 tablet by mouth daily at 12 noon.     meclizine 12.5 MG tablet  Commonly known as:  ANTIVERT  Take 12.5 mg by mouth 3 (three) times daily as needed for dizziness.     metoprolol 50 MG tablet   Commonly known as:  LOPRESSOR  Take 1 tablet (50 mg total) by mouth 2 (two) times daily.     NUTRITIONAL SUPPLEMENT PO  Take 6 tablets by mouth 2 (two) times daily. [place]: 40% ketosterones/ 20% 3-ketosterone     oxyCODONE-acetaminophen 5-325 MG per tablet  Commonly known as:  PERCOCET/ROXICET  Take 1-2 tablets by mouth every 6 (six) hours as needed for moderate pain.     Red Yeast Rice Extract 600 MG Caps  Take 1,200 mg by mouth 2 (two) times daily.         Follow-up Information    Follow up with Jim Roof, MD. Go on 04/01/2015.   Specialty:  General Surgery   Why:  For post-operation check on 8/29 at 2:40, arrive at 2:15 to check in and fill out paperwork.   Contact information:   [address] [name] [address] [phone]       Follow up with [place]. Go on 03/19/2015.   Why:  You will need an appointment for staple removal next week on Tuesday call the office to find out date/time.   Contact information:   [name]      Follow up with Jim Dodson., MD. Jim Dodson on 03/20/2015.   Specialty:  Cardiology   Why:  ASAP regarding your blood pressure and histoy of AFIB.   Contact information:   [name] [phone]       Signed: Nat Dodson, [facility] [phone]  03/14/2015, 4:55 PM

## 2015-03-14 NOTE — Progress Notes (Signed)
Patient and wife given discharge instructions and they verbalized understanding of all instructions and follow-up information.  JP removed without any difficulty.  IVs D/C'd.  Patient taken down via wheelchair for discharge home with his wife.

## 2015-03-14 NOTE — Progress Notes (Signed)
Patient Name: Jim Dodson Date of Encounter: 03/14/2015  Primary Cardiologist: Bartholome Bill MD [facility] [place])  Patient Profile: 74 yo male with PMH of CAD (PCI, 2010) HTN and abdominal hernia admitted on 03/08/15 for SBO 2ry to ventral hernia. Underwent surgery on 03/08/15 followed by second surgery on 03/09/15 for evacuation of hematoma. Went into Afib w/RVR on 03/10/15.    Active Problems:   SBO (small bowel obstruction)   Atrial fibrillation with rapid ventricular response   Chronic coronary artery disease   Elevated troponin   Patient receiving intravenous heparin therapy   Incarcerated ventral hernia    SUBJECTIVE  Denies any CP or SOB.  CURRENT MEDS . metoprolol tartrate  50 mg Oral BID  . ondansetron  4 mg Intravenous Once  . pantoprazole (PROTONIX) IV  40 mg Intravenous QHS  . sodium chloride  3 mL Intravenous Q12H   . sodium chloride Stopped (03/10/15 2100)  . sodium chloride    . dextrose 5 % and 0.9 % NaCl with KCl 20 mEq/L 50 mL/hr at 03/14/15 0715  . heparin 1,450 Units/hr (03/14/15 1223)   at 1100 u/hr.  OBJECTIVE  Filed Vitals:   03/14/15 0100 03/14/15 0437 03/14/15 0953 03/14/15 1324  BP: 155/89 162/95 152/90 146/84  Pulse: 91 91 86 69  Temp: 98.7 F (37.1 C) 98.6 F (37 C) 97.5 F (36.4 C) 98.4 F (36.9 C)  TempSrc: Oral Oral Oral Oral  Resp: 18 18 19 18   Height:      Weight:      SpO2: 99% 99% 98% 97%    Intake/Output Summary (Last 24 hours) at 03/14/15 1427 Last data filed at 03/14/15 1223  Gross per 24 hour  Intake    979 ml  Output    815 ml  Net    164 ml   Filed Weights   03/13/15 0348 03/13/15 0844 03/13/15 1731  Weight: 205 lb 0.4 oz (93 kg) 205 lb (92.987 kg) 209 lb (94.802 kg)    PHYSICAL EXAM  General: Pleasant, NAD. Neuro: Alert and oriented X 3. Moves all extremities spontaneously. Psych: Normal affect. HEENT:  Normal  Neck: Supple without bruits or JVD. Lungs:  Resp regular and unlabored, CTA. Heart: RRR no s3,  s4, or murmurs. Abdomen: +bowel sound. Dressing in place midline. RLQ drain in place Extremities: No clubbing, cyanosis or edema. DP/PT/Radials 2+ and equal bilaterally.  Accessory Clinical Findings  CBC  Recent Labs  03/13/15 0220 03/14/15 0525  WBC 6.4 5.8  HGB 10.2* 9.6*  HCT 31.1* 28.9*  MCV 91.2 92.0  PLT 148* 607   Basic Metabolic Panel  Recent Labs  03/12/15 0305  NA 142  K 3.2*  CL 108  CO2 28  GLUCOSE 111*  BUN 11  CREATININE 0.78  CALCIUM 7.8*   Liver Function Tests  Recent Labs  03/12/15 0305  AST 38  ALT 36  ALKPHOS 40  BILITOT 1.1  PROT 5.4*  ALBUMIN 2.7*    TELE NSR over night with HR 60-80s, 1 episode of 9 beats run of NSVT, occasional SVT overnight.    ECG  No EKG this morning.   Echocardiogram 03/12/2015  Left ventricle: The cavity size was normal. Systolic function was normal. The estimated ejection fraction was in the range of 60% to 65%. Images were inadequate for LV wall motion assessment. The study was not technically sufficient to allow evaluation of LV diastolic dysfunction due to atrial fibrillation. - Aortic valve: Trileaflet; normal thickness, mildly calcified  leaflets. There was mild regurgitation. - Mitral valve: There was trivial regurgitation.     Radiology/Studies  Ct Abdomen Pelvis Wo Contrast  03/08/2015   CLINICAL DATA:  Acute onset of periumbilical abdominal pain. Vomiting and nausea. Initial encounter.  EXAM: CT ABDOMEN AND PELVIS WITHOUT CONTRAST  TECHNIQUE: Multidetector CT imaging of the abdomen and pelvis was performed following the standard protocol without IV contrast.  COMPARISON:  CT of the abdomen and pelvis performed 12/22/2011, and renal ultrasound performed 07/20/2011  FINDINGS: The visualized lung bases are clear. Scattered coronary artery calcifications are seen.  A tiny calcified granuloma is noted at the left hepatic lobe. A small vague 8 mm hypodensity within the left hepatic lobe may  reflect a small cyst, grossly stable from 2013. The spleen is unremarkable in appearance. The gallbladder is within normal limits.  A 2.5 cm fat containing right adrenal adenoma is noted. The left adrenal gland is unremarkable. The pancreas is within normal limits.  Vaguely abnormal contour of the left kidney near its lower pole, with minimal adjacent calcification, appears to reflect a cyst, on correlation with prior CT from 2013. Nonspecific perinephric stranding is noted bilaterally. A few nonobstructing right renal stones are seen, measuring up to 5 mm in size. There is no evidence of hydronephrosis. No obstructing ureteral stones are identified.  Several anterior abdominal wall hernias are noted along the mid abdominal wall, just superior to the umbilicus, with a small umbilical hernia also seen. There is herniation of a short 4 cm segment of proximal ileum into a right-sided anterior abdominal wall hernia, resulting in relatively high-grade small bowel obstruction. There is dilatation of small-bowel loops to 3.8 cm in maximal diameter.  Trace free fluid is seen within the pelvis. There is decompression of all small bowel loops distal to the herniation. Residual stool is noted within the colon. Scattered diverticulosis is noted along the sigmoid colon, without definite evidence of diverticulitis.  The stomach is within normal limits. No acute vascular abnormalities are seen. Relatively diffuse calcification is noted along the abdominal aorta and its branches.  The appendix is decompressed.  There is no evidence of appendicitis.  The bladder is mildly distended and grossly unremarkable. The prostate remains normal in size, with scattered calcification. A moderate left inguinal hernia is seen, containing trace fluid. No inguinal lymphadenopathy is seen.  No acute osseous abnormalities are identified. Facet disease is noted at the lower lumbar spine. Endplate sclerotic change is seen at T10-T11.  IMPRESSION: 1.  Focal herniation of a short 4 cm segment of proximal ileum into a right-sided anterior abdominal wall hernia, resulting in relatively high-grade small bowel obstruction. Dilatation of small-bowel loops to 3.8 cm in maximal diameter, trace free fluid in the pelvis. Decompression of small bowel loops distal to the herniation. 2. Multiple anterior abdominal wall hernias seen at the mid abdominal wall, just superior to the umbilicus, with a small umbilical hernia also seen. Moderate left inguinal hernia noted, containing trace fluid. 3. Scattered coronary artery calcifications seen. 4. Tiny hepatic cyst. Apparent left renal cyst. Few nonobstructing right renal stones measure up to 5 mm in size. 5. 2.5 cm fat containing right adrenal adenoma noted. 6. Scattered diverticulosis along the sigmoid colon, without evidence of diverticulitis. 7. Relatively diffuse calcification along the abdominal aorta and its branches. These results were called by telephone at the time of interpretation on 03/08/2015 at 6:46 pm to Dr. Ezequiel Essex, who verbally acknowledged these results.   Electronically Signed   By: Jacqulynn Cadet  Chang M.D.   On: 03/08/2015 18:48    ASSESSMENT AND PLAN  1. Atrial fibrillation with rapid ventricular response - s/p DCCV 03/13/2015  - Unknown duration of a.fib. Last EKG was in 2010 prior to this episode. Cannot confirm patient has been in a.fib for less than 48 hours. The last time he was on the monitor would have been during evacuation of hematoma on 08/06.   - CHA2DS2-Vasc score 2 (age, HTN, CAD). ASA and plavix held  - Echo 03/12/2015 EF 60-65%  - currently in NSR, HR 60-80s, metoprolol increased to 50mg  BID this morning which should suppress his PAC and NSVT enough  - likely discharge by surgery today, will need to figure out anticoagulation need before discharge, otherwise he is stable. Note ASA and plavix held. Last PCI in 2010, therefore no urgent need to restart.   - discussed with surgery PA, low  risk of bleeding at this time, potentially do coumadin vs eliquis for short period, if eliquis, may want to do 2.5mg  BID instead to avoid bleeding risk.   - discussed with the patient who will followup with Dr. Ubaldo Glassing in [place]  2. CAD s/p PCI in 2010   Signed, Meng, Hao PA-C Pager: [phone]   Patient seen and examined. Agree with assessment and plan. Maintaining NSR without recurrent AF. No chest pain. Drains to be removed today prior to dc. Consider starting low dose eliquis at 2.5 mg bid when ok from surgical standpoint, but would not start today ( ? 24 - 72 hrs). Pt to f/u with Dr. Marlou Starks and recommend with Dr. Ubaldo Glassing next week.   Troy Sine, MD, [facility] 03/14/2015 2:51 PM

## 2015-03-14 NOTE — Progress Notes (Signed)
[facility] Progress Note  1 Day Post-Op  Subjective: Pt doing well, not much pain except for over bruising in LLQ.  Tolerating diet well.  No N/V.  Ambulating some OOB - up to chair today.  Wife at bedside.  Drain with bloody drainage.  +BM and flatus.  Objective: Vital signs in last 24 hours: Temp:  [97.9 F (36.6 C)-99.2 F (37.3 C)] 98.6 F (37 C) (08/11 0437) Pulse Rate:  [78-93] 91 (08/11 0437) Resp:  [18-27] 18 (08/11 0437) BP: (125-175)/(79-114) 162/95 mmHg (08/11 0437) SpO2:  [95 %-100 %] 99 % (08/11 0437) Weight:  [94.802 kg (209 lb)] 94.802 kg (209 lb) (08/10 1731) Last BM Date: 03/14/15  Intake/Output from previous day: 08/10 0701 - 08/11 0700 In: [place] [P.O.:240; I.V.:1543] Out: 890 [Urine:825; Drains:65] Intake/Output this shift:    PE: Gen:  Alert, NAD, pleasant Abd: Soft, ND, mild tenderness in LLQ over ecchymosis, +BS, no HSM, incisions C/D/I, drain with minimal sanguinous drainage, no abdominal scars noted   Lab Results:   Recent Labs  03/13/15 0220 03/14/15 0525  WBC 6.4 5.8  HGB 10.2* 9.6*  HCT 31.1* 28.9*  PLT 148* 153   BMET  Recent Labs  03/12/15 0305  NA 142  K 3.2*  CL 108  CO2 28  GLUCOSE 111*  BUN 11  CREATININE 0.78  CALCIUM 7.8*   PT/INR No results for input(s): LABPROT, INR in the last 72 hours. CMP     Component Value Date/Time   NA 142 03/12/2015 0305   K 3.2* 03/12/2015 0305   CL 108 03/12/2015 0305   CO2 28 03/12/2015 0305   GLUCOSE 111* 03/12/2015 0305   BUN 11 03/12/2015 0305   CREATININE 0.78 03/12/2015 0305   CALCIUM 7.8* 03/12/2015 0305   PROT 5.4* 03/12/2015 0305   ALBUMIN 2.7* 03/12/2015 0305   AST 38 03/12/2015 0305   ALT 36 03/12/2015 0305   ALKPHOS 40 03/12/2015 0305   BILITOT 1.1 03/12/2015 0305   GFRNONAA >60 03/12/2015 0305   GFRAA >60 03/12/2015 0305   Lipase     Component Value Date/Time   LIPASE 34 03/08/2015 1528       Studies/Results: No results  found.  Anti-infectives: Anti-infectives    Start     Dose/Rate Route Frequency Ordered Stop   03/08/15 1945  ceFAZolin (ANCEF) IVPB 2 g/50 mL premix     2 g 100 mL/hr over 30 Minutes Intravenous  Once 03/08/15 1930 03/08/15 2017       Assessment/Plan POD #6,5, s/p ex lap with ventral hernia repair and take back for evacuation of hematoma -Advance to heart healthy diet -Ambulate TID -Oral pain meds -Continue drain at discharge, follow up with Dr. Marlou Starks in 2-3 weeks -Dressing change today -Staples/?drain to be removed on POD #10, scheduled with nurse visit A fib -Now in NSR s/p successful TEE cardioversion -Saw where Dr. Claiborne Billings wanted Metoprolol increased to 50mg  BID, I ordered this today DVT prophylaxis -SCDs/ heparin drip (will defer this to cards) Disp - Now on floor, ready for d/c from surgical perspective, await cardiology's recommendations    LOS: 6 days    Nat Christen 03/14/2015, 8:51 AM Pager: [phone]

## 2015-03-14 NOTE — Progress Notes (Signed)
[place] for heparin Indication: atrial fibrillation  Allergies  Allergen Reactions  . Statins     Patient Measurements: Height: 5\' 6"  (167.6 cm) Weight: 209 lb (94.802 kg) IBW/kg (Calculated) : 63.8 Heparin Dosing Weight: 72kg  Vital Signs: Temp: 98.6 F (37 C) (08/11 0437) Temp Source: Oral (08/11 0437) BP: 162/95 mmHg (08/11 0437) Pulse Rate: 91 (08/11 0437)  Labs:  Recent Labs  03/12/15 0305  03/12/15 1626 03/13/15 0220 03/14/15 0452 03/14/15 0525  HGB 10.1*  --   --  10.2*  --  9.6*  HCT 30.4*  --   --  31.1*  --  28.9*  PLT 136*  --   --  148*  --  153  HEPARINUNFRC  --   < > 0.23* 0.36 0.25*  --   CREATININE 0.78  --   --   --   --   --   < > = values in this interval not displayed.  Estimated Creatinine Clearance: 90 mL/min (by C-G formula based on Cr of 0.78).  Assessment: 73 YOM on heparin for afib. S/p successful TEE cardioversion 8/10. Heparin level subtherapeutic on 1300 units/hr. Hgb down a bit, plt ok. No bleeding noted or issues with line per RN.  Goal of Therapy:  Heparin level 0.3-0.5 (correlates with aPTT 55-75 as requested by MD) units/ml Monitor platelets by anticoagulation protocol: Yes   Plan:  -Increase heparin to 1450 units/hr -Will f/u 8 hr heparin level -Daiy heparin level and CBC  Sherlon Handing, PharmD, BCPS Clinical pharmacist, pager [phone] 03/14/2015 6:21 AM

## 2015-03-14 NOTE — Discharge Instructions (Signed)
[facility]      [facility] °[phone] ° °OPEN ABDOMINAL SURGERY: POST OP INSTRUCTIONS ° °Always review your discharge instruction sheet given to you by the facility where your surgery was performed. ° °IF YOU HAVE DISABILITY OR FAMILY LEAVE FORMS, YOU MUST BRING THEM TO THE OFFICE FOR PROCESSING.  PLEASE DO NOT GIVE THEM TO YOUR DOCTOR. ° °1. A prescription for pain medication may be given to you upon discharge.  Take your pain medication as prescribed, if needed.  If narcotic pain medicine is not needed, then you may take acetaminophen (Tylenol) or ibuprofen (Advil) as needed. °2. Take your usually prescribed medications unless otherwise directed. °3. If you need a refill on your pain medication, please contact your pharmacy. They will contact our office to request authorization.  Prescriptions will not be filled after 5pm or on week-ends. °4. You should follow a light diet the first few days after arrival home, such as soup and crackers, pudding, etc.unless your doctor has advised otherwise. A high-fiber, low fat diet can be resumed as tolerated.   Be sure to include lots of fluids daily. Most patients will experience some swelling and bruising on the chest and neck area.  Ice packs will help.  Swelling and bruising can take several days to resolve °5. Most patients will experience some swelling and bruising in the area of the incision. Ice pack will help. Swelling and bruising can take several days to resolve..  °6. It is common to experience some constipation if taking pain medication after surgery.  Increasing fluid intake and taking a stool softener will usually help or prevent this problem from occurring.  A mild laxative (Milk of Magnesia or Miralax) should be taken according to package directions if there are no bowel movements after 48 hours. °7.  You may have steri-strips (small skin tapes) in place directly over the incision.  These strips should be left on the skin for 7-10 days.  If your  surgeon used skin glue on the incision, you may shower in 24 hours.  The glue will flake off over the next 2-3 weeks.  Any sutures or staples will be removed at the office during your follow-up visit. You may find that a light gauze bandage over your incision may keep your staples from being rubbed or pulled. You may shower and replace the bandage daily. °8. ACTIVITIES:  You may resume regular (light) daily activities beginning the next day--such as daily self-care, walking, climbing stairs--gradually increasing activities as tolerated.  You may have sexual intercourse when it is comfortable.  Refrain from any heavy lifting or straining until approved by your doctor. °a. You may drive when you no longer are taking prescription pain medication, you can comfortably wear a seatbelt, and you can safely maneuver your car and apply brakes °b. Return to Work: ___________________________________ °9. You should see your doctor in the office for a follow-up appointment approximately two weeks after your surgery.  Make sure that you call for this appointment within a day or two after you arrive home to insure a convenient appointment time. °OTHER INSTRUCTIONS:  °_____________________________________________________________ °_____________________________________________________________ ° °WHEN TO CALL YOUR DOCTOR: °1. Fever over 101.0 °2. Inability to urinate °3. Nausea and/or vomiting °4. Extreme swelling or bruising °5. Continued bleeding from incision. °6. Increased pain, redness, or drainage from the incision. °7. Difficulty swallowing or breathing °8. Muscle cramping or spasms. °9. Numbness or tingling in hands or feet or around lips. ° °The clinic staff is available to   answer your questions during regular business hours.  Please dont hesitate to call and ask to speak to one of the nurses if you have concerns.  For further questions, please visit [email or web address]   Electrical Cardioversion Electrical  cardioversion is the delivery of a jolt of electricity to change the rhythm of the heart. Sticky patches or metal paddles are placed on the chest to deliver the electricity from a device. This is done to restore a normal rhythm. A rhythm that is too fast or not regular keeps the heart from pumping well. Electrical cardioversion is done in an emergency if:   There is low or no blood pressure as a result of the heart rhythm.   Normal rhythm must be restored as fast as possible to protect the brain and heart from further damage.   It may save a life. Cardioversion may be done for heart rhythms that are not immediately life threatening, such as atrial fibrillation or flutter, in which:   The heart is beating too fast or is not regular.   Medicine to change the rhythm has not worked.   It is safe to wait in order to allow time for preparation.  Symptoms of the abnormal rhythm are bothersome.  The risk of stroke and other serious problems can be reduced. LET [facility] CARE PROVIDER KNOW ABOUT:   Any allergies you have.  All medicines you are taking, including vitamins, herbs, eye drops, creams, and over-the-counter medicines.  Previous problems you or members of your family have had with the use of anesthetics.   Any blood disorders you have.   Previous surgeries you have had.   Medical conditions you have. RISKS AND COMPLICATIONS  Generally, this is a safe procedure. However, problems can occur and include:   Breathing problems related to the anesthetic used.  A blood clot that breaks free and travels to other parts of your body. This could cause a stroke or other problems. The risk of this is lowered by use of blood-thinning medicine (anticoagulant) prior to the procedure.  Cardiac arrest (rare). BEFORE THE PROCEDURE   You may have tests to detect blood clots in your heart and to evaluate heart function.  You may start taking anticoagulants so your blood does not  clot as easily.   Medicines may be given to help stabilize your heart rate and rhythm. PROCEDURE  You will be given medicine through an IV tube to reduce discomfort and make you sleepy (sedative).   An electrical shock will be delivered. AFTER THE PROCEDURE Your heart rhythm will be watched to make sure it does not change.  Document Released: 07/10/2002 Document Revised: 12/04/2013 Document Reviewed: 02/01/2013 [facility] Patient Information 2015 [place], [place]. This information is not intended to replace advice given to you by your health care provider. Make sure you discuss any questions you have with your health care provider.

## 2015-05-23 ENCOUNTER — Other Ambulatory Visit: Payer: Self-pay | Admitting: Internal Medicine

## 2015-05-23 DIAGNOSIS — R413 Other amnesia: Secondary | ICD-10-CM

## 2015-05-28 ENCOUNTER — Ambulatory Visit
Admission: RE | Admit: 2015-05-28 | Discharge: 2015-05-28 | Disposition: A | Discharge status: Home / Self Care | Payer: PPO | Source: Ambulatory Visit | Attending: Internal Medicine | Admitting: Internal Medicine

## 2015-05-28 ENCOUNTER — Other Ambulatory Visit (HOSPITAL_COMMUNITY): Payer: Self-pay | Admitting: Internal Medicine

## 2015-05-28 DIAGNOSIS — C61 Malignant neoplasm of prostate: Secondary | ICD-10-CM

## 2015-05-28 DIAGNOSIS — N320 Bladder-neck obstruction: Secondary | ICD-10-CM | POA: Insufficient documentation

## 2015-05-28 DIAGNOSIS — R413 Other amnesia: Secondary | ICD-10-CM

## 2015-06-03 ENCOUNTER — Other Ambulatory Visit: Payer: Self-pay | Admitting: Family Medicine

## 2015-06-04 ENCOUNTER — Encounter (HOSPITAL_COMMUNITY)
Admission: RE | Admit: 2015-06-04 | Discharge: 2015-06-04 | Disposition: A | Discharge status: Home / Self Care | Payer: PPO | Source: Ambulatory Visit | Attending: Internal Medicine | Admitting: Internal Medicine

## 2015-06-04 ENCOUNTER — Encounter (HOSPITAL_COMMUNITY): Payer: PPO

## 2015-06-04 DIAGNOSIS — C61 Malignant neoplasm of prostate: Secondary | ICD-10-CM | POA: Diagnosis present

## 2015-06-04 MED ORDER — TECHNETIUM TC 99M MEDRONATE IV KIT: 25.6000 | PACK | Freq: Once | INTRAVENOUS | Status: DC | PRN

## 2015-07-19 ENCOUNTER — Encounter
Admission: RE | Disposition: A | Discharge status: Home / Self Care | Payer: Self-pay | Source: Ambulatory Visit | Attending: Unknown Physician Specialty

## 2015-07-19 ENCOUNTER — Encounter: Payer: Self-pay | Admitting: *Deleted

## 2015-07-19 ENCOUNTER — Ambulatory Visit: Payer: PPO | Admitting: Anesthesiology

## 2015-07-19 ENCOUNTER — Ambulatory Visit
Admission: RE | Admit: 2015-07-19 | Discharge: 2015-07-19 | Disposition: A | Discharge status: Home / Self Care | Payer: PPO | Source: Ambulatory Visit | Attending: Unknown Physician Specialty | Admitting: Unknown Physician Specialty

## 2015-07-19 DIAGNOSIS — Z8546 Personal history of malignant neoplasm of prostate: Secondary | ICD-10-CM | POA: Diagnosis not present

## 2015-07-19 DIAGNOSIS — I2510 Atherosclerotic heart disease of native coronary artery without angina pectoris: Secondary | ICD-10-CM | POA: Diagnosis not present

## 2015-07-19 DIAGNOSIS — D122 Benign neoplasm of ascending colon: Secondary | ICD-10-CM | POA: Insufficient documentation

## 2015-07-19 DIAGNOSIS — D123 Benign neoplasm of transverse colon: Secondary | ICD-10-CM | POA: Insufficient documentation

## 2015-07-19 DIAGNOSIS — K210 Gastro-esophageal reflux disease with esophagitis: Secondary | ICD-10-CM | POA: Diagnosis not present

## 2015-07-19 DIAGNOSIS — I10 Essential (primary) hypertension: Secondary | ICD-10-CM | POA: Insufficient documentation

## 2015-07-19 DIAGNOSIS — I4891 Unspecified atrial fibrillation: Secondary | ICD-10-CM | POA: Diagnosis not present

## 2015-07-19 DIAGNOSIS — K921 Melena: Secondary | ICD-10-CM | POA: Insufficient documentation

## 2015-07-19 DIAGNOSIS — R0602 Shortness of breath: Secondary | ICD-10-CM | POA: Insufficient documentation

## 2015-07-19 DIAGNOSIS — D120 Benign neoplasm of cecum: Secondary | ICD-10-CM | POA: Diagnosis not present

## 2015-07-19 DIAGNOSIS — K625 Hemorrhage of anus and rectum: Secondary | ICD-10-CM | POA: Diagnosis present

## 2015-07-19 DIAGNOSIS — K2970 Gastritis, unspecified, without bleeding: Secondary | ICD-10-CM | POA: Insufficient documentation

## 2015-07-19 DIAGNOSIS — Z7901 Long term (current) use of anticoagulants: Secondary | ICD-10-CM | POA: Diagnosis not present

## 2015-07-19 HISTORY — DX: Atherosclerotic heart disease of native coronary artery without angina pectoris: I25.10

## 2015-07-19 HISTORY — DX: Malignant neoplasm of prostate: C61

## 2015-07-19 HISTORY — PX: ESOPHAGOGASTRODUODENOSCOPY (EGD) WITH PROPOFOL: SHX5813

## 2015-07-19 HISTORY — PX: COLONOSCOPY WITH PROPOFOL: SHX5780

## 2015-07-19 HISTORY — DX: Reserved for inherently not codable concepts without codable children: IMO0001

## 2015-07-19 SURGERY — COLONOSCOPY WITH PROPOFOL
Anesthesia: General

## 2015-07-19 MED ORDER — SODIUM CHLORIDE 0.9 % IV SOLN
INTRAVENOUS | Status: DC
  Administered 2015-07-19: 1000 mL via INTRAVENOUS

## 2015-07-19 MED ORDER — MIDAZOLAM HCL 2 MG/2ML IJ SOLN
INTRAMUSCULAR | Status: DC | PRN
  Administered 2015-07-19: 2 mg via INTRAVENOUS

## 2015-07-19 MED ORDER — PROPOFOL 500 MG/50ML IV EMUL
INTRAVENOUS | Status: DC | PRN
  Administered 2015-07-19: 160 ug/kg/min via INTRAVENOUS

## 2015-07-19 MED ORDER — LABETALOL HCL 5 MG/ML IV SOLN
INTRAVENOUS | Status: DC | PRN
  Administered 2015-07-19: 5 mg via INTRAVENOUS

## 2015-07-19 MED ORDER — METOPROLOL TARTRATE 1 MG/ML IV SOLN
INTRAVENOUS | Status: DC | PRN
  Administered 2015-07-19: 5 mg via INTRAVENOUS

## 2015-07-19 MED ORDER — LIDOCAINE HCL (CARDIAC) 20 MG/ML IV SOLN
INTRAVENOUS | Status: DC | PRN
  Administered 2015-07-19: 80 mg via INTRAVENOUS

## 2015-07-19 MED ORDER — ESMOLOL HCL 100 MG/10ML IV SOLN
INTRAVENOUS | Status: DC | PRN
  Administered 2015-07-19: 10 mg via INTRAVENOUS

## 2015-07-19 MED ORDER — FENTANYL CITRATE (PF) 100 MCG/2ML IJ SOLN: 25.0000 ug | INTRAMUSCULAR | Status: DC | PRN

## 2015-07-19 MED ORDER — ONDANSETRON HCL 4 MG/2ML IJ SOLN: 4.0000 mg | Freq: Once | INTRAMUSCULAR | Status: DC | PRN

## 2015-07-19 NOTE — Addendum Note (Signed)
Addendum  created 07/19/15 1320 by Alvin Critchley, MD   Modules edited: Clinical Notes   Clinical Notes:  File: [id number]

## 2015-07-19 NOTE — Op Note (Signed)
[facility] Gastroenterology Patient Name: Jim Dodson Procedure Date: 07/19/2015 9:36 AM MRN: [id number] Account #: [id number] Date of Birth: 17-Aug-1940 Admit Type: Outpatient Age: 74 Room: [facility] ENDO ROOM 1 Gender: Male Note Status: Finalized Procedure:         Upper GI endoscopy Indications:       [place] Providers:         Manya Silvas, MD Medicines:         Propofol per Anesthesia Complications:     No immediate complications. Procedure:         Pre-Anesthesia Assessment:                    - After reviewing the risks and benefits, the patient was                     deemed in satisfactory condition to undergo the procedure.                    After obtaining informed consent, the endoscope was passed                     under direct vision. Throughout the procedure, the                     patient's blood pressure, pulse, and oxygen saturations                     were monitored continuously. The Endoscope was introduced                     through the mouth, and advanced to the second part of                     duodenum. The upper GI endoscopy was accomplished without                     difficulty. The patient tolerated the procedure well. Findings:      LA Grade A (one or more mucosal breaks less than 5 mm, not extending       between tops of 2 mucosal folds) esophagitis with no bleeding was found       41 cm from the incisors. Biopsies were taken with a cold forceps for       histology.      Patchy mild inflammation characterized by erythema and granularity was       found on the posterior wall of the gastric antrum. Biopsies were taken       with a cold forceps for histology. Biopsies were taken with a cold       forceps for Helicobacter pylori testing.      Localized mild inflammation characterized by erythema and granularity       was found in the duodenal bulb. Biopsies were taken with a cold forceps       for histology.      The exam was  otherwise without abnormality. Impression:        - LA Grade A reflux esophagitis. Rule out Barrett's                     esophagus. Biopsied.                    - Gastritis. Biopsied.                    -  Duodenitis. Biopsied.                    - The examination was otherwise normal. Recommendation:    - Await pathology results. Manya Silvas, MD 07/19/2015 9:58:11 AM This report has been signed electronically. Number of Addenda: 0 Note Initiated On: 07/19/2015 9:36 AM      [facility]

## 2015-07-19 NOTE — Anesthesia Postprocedure Evaluation (Addendum)
Anesthesia Post Note  Patient: Jim Dodson  Procedure(s) Performed: Procedure(s) (LRB): COLONOSCOPY WITH PROPOFOL (N/A) ESOPHAGOGASTRODUODENOSCOPY (EGD) WITH PROPOFOL (N/A)  Patient location during evaluation: Endoscopy Anesthesia Type: General Level of consciousness: awake, awake and alert and oriented Pain management: pain level controlled Vital Signs Assessment: post-procedure vital signs reviewed and stable Respiratory status: spontaneous breathing Cardiovascular status: blood pressure returned to baseline Anesthetic complications: no    Last Vitals:  Filed Vitals:   07/19/15 1050 07/19/15 1100  BP: 138/101 138/98  Pulse: 79 90  Temp:    Resp: 21 19    Last Pain: There were no vitals filed for this visit.               Luisdavid Hamblin

## 2015-07-19 NOTE — H&P (Signed)
Primary Care Physician:  Precious Reel, MD Primary Gastroenterologist:  Dr. Vira Agar  Pre-Procedure History & Physical: HPI:  Jim Dodson is a 74 y.o. male is here for an endoscopy and colonoscopy.   Past Medical History  Diagnosis Date  . Hypertension   . Shortness of breath dyspnea   . Prostate cancer ([place])   . Coronary artery disease     Past Surgical History  Procedure Laterality Date  . Laparotomy N/A 03/08/2015    Procedure: EXPLORATORY LAPAROTOMY;  Surgeon: Autumn Messing III, MD;  Location: [place];  Service: General;  Laterality: N/A;  . Ventral hernia repair N/A 03/08/2015    Procedure:  VENTRAL HERNIA  REPAIR ADULT;  Surgeon: Autumn Messing III, MD;  Location: [place];  Service: General;  Laterality: N/A;  . Lysis of adhesion N/A 03/08/2015    Procedure: LYSIS OF ADHESION;  Surgeon: Autumn Messing III, MD;  Location: [place];  Service: General;  Laterality: N/A;  . Omentectomy N/A 03/08/2015    Procedure: OMENTECTOMY;  Surgeon: Autumn Messing III, MD;  Location: [place];  Service: General;  Laterality: N/A;  . Umbilical hernia repair  03/08/2015    Procedure: UMBILICAL HERNIA REPAIR  ADULT;  Surgeon: Autumn Messing III, MD;  Location: [place];  Service: General;;  . Laparotomy N/A 03/09/2015    Procedure: EXPLORATORY LAPAROTOMY AND EVACUATION OF HEMATOMA;  Surgeon: Autumn Messing III, MD;  Location: [place];  Service: General;  Laterality: N/A;  . Tee without cardioversion N/A 03/13/2015    Procedure: TRANSESOPHAGEAL ECHOCARDIOGRAM (TEE);  Surgeon: Jerline Pain, MD;  Location: [place];  Service: Cardiovascular;  Laterality: N/A;  . Cardioversion N/A 03/13/2015    Procedure: CARDIOVERSION;  Surgeon: Jerline Pain, MD;  Location: [facility] ENDOSCOPY;  Service: Cardiovascular;  Laterality: N/A;  . Cryo surgery of prostate      Prior to Admission medications   Medication Sig Start Date End Date Taking? Authorizing Provider  apixaban (ELIQUIS) 5 MG TABS tablet Take 5 mg by mouth 2 (two) times daily.   Yes Historical  Provider, MD  lisinopril (PRINIVIL,ZESTRIL) 20 MG tablet Take 20 mg by mouth daily.   Yes Historical Provider, MD  metoprolol (LOPRESSOR) 50 MG tablet Take 1 tablet (50 mg total) by mouth 2 (two) times daily. 03/14/15  Yes Nat Christen, PA-C  omeprazole (PRILOSEC) 20 MG capsule Take 20 mg by mouth daily.   Yes Historical Provider, MD  acetaminophen (TYLENOL) 500 MG tablet Take 2 tablets (1,000 mg total) by mouth every 6 (six) hours as needed for moderate pain, fever or headache. 03/14/15   Nat Christen, PA-C  Hyaluronic Acid-Vitamin C (HYALURONIC ACID PO) Take 100 mg by mouth daily at 12 noon.    Historical Provider, MD  MAGNESIUM-POTASSIUM PO Take 1 tablet by mouth daily at 12 noon.    Historical Provider, MD  meclizine (ANTIVERT) 12.5 MG tablet Take 12.5 mg by mouth 3 (three) times daily as needed for dizziness.    Historical Provider, MD  Nutritional Supplements (NUTRITIONAL SUPPLEMENT PO) Take 6 tablets by mouth 2 (two) times daily. [place]: 40% ketosterones/ 20% 3-ketosterone    Historical Provider, MD  oxyCODONE-acetaminophen (PERCOCET/ROXICET) 5-325 MG per tablet Take 1-2 tablets by mouth every 6 (six) hours as needed for moderate pain. 03/14/15   Nat Christen, PA-C  Red Yeast Rice Extract 600 MG CAPS Take 1,200 mg by mouth 2 (two) times daily.    Historical Provider, MD    Allergies as of 07/09/2015 -  Review Complete 03/13/2015  Allergen Reaction Noted  . Statins  03/08/2015    History reviewed. No pertinent family history.  Social History   Social History  . Marital Status: Married    Spouse Name: N/A  . Number of Children: N/A  . Years of Education: N/A   Occupational History  . Not on file.   Social History Main Topics  . Smoking status: Never Smoker   . Smokeless tobacco: Not on file  . Alcohol Use: No  . Drug Use: No  . Sexual Activity: Not on file   Other Topics Concern  . Not on file   Social History Narrative    Review of Systems: See HPI, otherwise  negative ROS  Physical Exam: BP 156/102 mmHg  Temp(Src) 97.2 F (36.2 C) (Tympanic)  Resp 20  Ht 5\' 5"  (1.651 m)  Wt 88.451 kg (195 lb)  BMI 32.45 kg/m2  SpO2 99% General:   Alert,  pleasant and cooperative in NAD Head:  Normocephalic and atraumatic. Neck:  Supple; no masses or thyromegaly. Lungs:  Clear throughout to auscultation.    Heart:  Regular rate and rhythm. Abdomen:  Soft, nontender and nondistended. Normal bowel sounds, without guarding, and without rebound.   Neurologic:  Alert and  oriented x4;  grossly normal neurologically.  Impression/Plan: Jim Dodson is here for an endoscopy and colonoscopy to be performed for rectal bleeding, melena  Risks, benefits, limitations, and alternatives regarding  endoscopy and colonoscopy have been reviewed with the patient.  Questions have been answered.  All parties agreeable.   Gaylyn Cheers, MD  07/19/2015, 9:45 AM

## 2015-07-19 NOTE — Transfer of Care (Signed)
Immediate Anesthesia Transfer of Care Note  Patient: Jim Dodson  Procedure(s) Performed: Procedure(s): COLONOSCOPY WITH PROPOFOL (N/A) ESOPHAGOGASTRODUODENOSCOPY (EGD) WITH PROPOFOL (N/A)  Patient Location: PACU and Endoscopy Unit  Anesthesia Type:General  Level of Consciousness: awake, alert  and oriented  Airway & Oxygen Therapy: Patient Spontanous Breathing and Patient connected to nasal cannula oxygen  Post-op Assessment: Report given to RN  Post vital signs: Reviewed and stable    Last Vitals: 10:28 103 hr afib 97% sat 109/78 18 resp  Filed Vitals:   07/19/15 0853  BP: 156/102  Temp: 36.2 C  Resp: 20    Complications: No apparent anesthesia complications

## 2015-07-19 NOTE — Op Note (Signed)
[facility] Gastroenterology Patient Name: Jim Dodson Procedure Date: 07/19/2015 9:36 AM MRN: [id number] Account #: [id number] Date of Birth: June 13, 1941 Admit Type: Outpatient Age: 74 Room: [facility] ENDO ROOM 1 Gender: Male Note Status: Finalized Procedure:         Colonoscopy Indications:       [place] Providers:         Manya Silvas, MD Medicines:         Propofol per Anesthesia Complications:     No immediate complications. Procedure:         Pre-Anesthesia Assessment:                    - After reviewing the risks and benefits, the patient was                     deemed in satisfactory condition to undergo the procedure.                    After obtaining informed consent, the colonoscope was                     passed under direct vision. Throughout the procedure, the                     patient's blood pressure, pulse, and oxygen saturations                     were monitored continuously. The Olympus PCF-H180AL                     colonoscope ( S#: [id number] ) was introduced through the                     anus and advanced to the the cecum, identified by                     appendiceal orifice and ileocecal valve. The colonoscopy                     was performed without difficulty. The patient tolerated                     the procedure well. The quality of the bowel preparation                     was adequate to identify polyps. Findings:      A diminutive polyp was found in the cecum. The polyp was sessile. The       polyp was removed with a jumbo cold forceps. Resection and retrieval       were complete.      A diminutive polyp was found in the ascending colon. The polyp was       sessile. The polyp was removed with a jumbo cold forceps. Resection and       retrieval were complete.      A small polyp was found in the proximal ascending colon. The polyp was       sessile. The polyp was removed with a hot snare. Resection and retrieval       were  complete.      A diminutive polyp was found at the hepatic flexure. The polyp was       sessile. The polyp was removed with a jumbo cold forceps. Resection and  retrieval were complete.      A small polyp was found at the hepatic flexure. The polyp was sessile.       The polyp was removed with a cold snare. Resection and retrieval were       complete. Impression:        - One diminutive polyp in the cecum. Resected and                     retrieved.                    - One diminutive polyp in the ascending colon. Resected                     and retrieved.                    - One small polyp in the proximal ascending colon.                     Resected and retrieved.                    - One diminutive polyp at the hepatic flexure. Resected                     and retrieved.                    - One small polyp at the hepatic flexure. Resected and                     retrieved. Recommendation:    - Await pathology results. Manya Silvas, MD 07/19/2015 10:26:09 AM This report has been signed electronically. Number of Addenda: 0 Note Initiated On: 07/19/2015 9:36 AM Scope Withdrawal Time: 0 hours 17 minutes 33 seconds  Total Procedure Duration: 0 hours 22 minutes 40 seconds       [facility]

## 2015-07-19 NOTE — Anesthesia Preprocedure Evaluation (Addendum)
Anesthesia Evaluation  Patient identified by MRN, date of birth, ID band Patient awake    Reviewed: Allergy & Precautions, NPO status , Patient's Chart, lab work & pertinent test results, reviewed documented beta blocker date and time   Airway Mallampati: III  TM Distance: >3 FB Neck ROM: Limited    Dental  (+) Chipped, Partial Lower   Pulmonary shortness of breath and with exertion,    Pulmonary exam normal breath sounds clear to auscultation       Cardiovascular hypertension, Pt. on medications and Pt. on home beta blockers + CAD  Normal cardiovascular exam+ dysrhythmias Atrial Fibrillation      Neuro/Psych negative neurological ROS  negative psych ROS   GI/Hepatic Neg liver ROS, Hx of SBO   Endo/Other  negative endocrine ROS  Renal/GU negative Renal ROS  negative genitourinary   Musculoskeletal negative musculoskeletal ROS (+)   Abdominal Normal abdominal exam  (+)   Peds negative pediatric ROS (+)  Hematology negative hematology ROS (+) REFUSES BLOOD PRODUCTS,   Anesthesia Other Findings Prostate CA HX  Reproductive/Obstetrics                           Anesthesia Physical Anesthesia Plan  ASA: III  Anesthesia Plan: General   Post-op Pain Management:    Induction: Intravenous  Airway Management Planned: Nasal Cannula  Additional Equipment:   Intra-op Plan:   Post-operative Plan:   Informed Consent: I have reviewed the patients History and Physical, chart, labs and discussed the procedure including the risks, benefits and alternatives for the proposed anesthesia with the patient or authorized representative who has indicated his/her understanding and acceptance.   Dental advisory given  Plan Discussed with: CRNA and Surgeon  Anesthesia Plan Comments:         Anesthesia Quick Evaluation

## 2015-07-20 ENCOUNTER — Encounter: Payer: Self-pay | Admitting: Unknown Physician Specialty

## 2015-07-22 LAB — SURGICAL PATHOLOGY

## 2015-07-25 ENCOUNTER — Inpatient Hospital Stay (HOSPITAL_COMMUNITY)
Admission: EM | Admit: 2015-07-25 | Discharge: 2015-07-27 | DRG: 871 | Disposition: A | Discharge status: Home / Self Care | Payer: PPO | Attending: Internal Medicine | Admitting: Internal Medicine

## 2015-07-25 ENCOUNTER — Emergency Department (HOSPITAL_COMMUNITY): Payer: PPO

## 2015-07-25 ENCOUNTER — Encounter (HOSPITAL_COMMUNITY): Payer: Self-pay | Admitting: *Deleted

## 2015-07-25 DIAGNOSIS — I2510 Atherosclerotic heart disease of native coronary artery without angina pectoris: Secondary | ICD-10-CM | POA: Diagnosis present

## 2015-07-25 DIAGNOSIS — K219 Gastro-esophageal reflux disease without esophagitis: Secondary | ICD-10-CM | POA: Diagnosis present

## 2015-07-25 DIAGNOSIS — I10 Essential (primary) hypertension: Secondary | ICD-10-CM | POA: Diagnosis present

## 2015-07-25 DIAGNOSIS — G9340 Encephalopathy, unspecified: Secondary | ICD-10-CM | POA: Diagnosis present

## 2015-07-25 DIAGNOSIS — Z7901 Long term (current) use of anticoagulants: Secondary | ICD-10-CM | POA: Diagnosis not present

## 2015-07-25 DIAGNOSIS — J069 Acute upper respiratory infection, unspecified: Secondary | ICD-10-CM | POA: Diagnosis present

## 2015-07-25 DIAGNOSIS — Z79899 Other long term (current) drug therapy: Secondary | ICD-10-CM | POA: Diagnosis not present

## 2015-07-25 DIAGNOSIS — A419 Sepsis, unspecified organism: Principal | ICD-10-CM | POA: Diagnosis present

## 2015-07-25 DIAGNOSIS — Z955 Presence of coronary angioplasty implant and graft: Secondary | ICD-10-CM

## 2015-07-25 DIAGNOSIS — Z8546 Personal history of malignant neoplasm of prostate: Secondary | ICD-10-CM | POA: Diagnosis not present

## 2015-07-25 DIAGNOSIS — D649 Anemia, unspecified: Secondary | ICD-10-CM | POA: Diagnosis present

## 2015-07-25 DIAGNOSIS — I4891 Unspecified atrial fibrillation: Secondary | ICD-10-CM | POA: Diagnosis present

## 2015-07-25 DIAGNOSIS — R410 Disorientation, unspecified: Secondary | ICD-10-CM | POA: Diagnosis not present

## 2015-07-25 DIAGNOSIS — I480 Paroxysmal atrial fibrillation: Secondary | ICD-10-CM | POA: Diagnosis present

## 2015-07-25 LAB — TROPONIN I
Troponin I: 0.05 ng/mL — ABNORMAL HIGH (ref <0.031)
Troponin I: 0.04 ng/mL — ABNORMAL HIGH (ref <0.031)
Troponin I: 0.04 ng/mL — ABNORMAL HIGH (ref <0.031)

## 2015-07-25 LAB — SALICYLATE LEVEL: Salicylate Lvl: <4 mg/dL (ref 2.8–30.0)

## 2015-07-25 LAB — URINALYSIS, ROUTINE W REFLEX MICROSCOPIC
Bilirubin Urine: NEGATIVE
Glucose, UA: NEGATIVE mg/dL
Ketones, ur: 15 mg/dL — AB
Leukocytes, UA: NEGATIVE
Nitrite: NEGATIVE
Protein, ur: 30 mg/dL — AB
Specific Gravity, Urine: 1.019 (ref 1.005–1.030)
pH: 7 (ref 5.0–8.0)

## 2015-07-25 LAB — I-STAT TROPONIN, ED: Troponin i, poc: 0.06 ng/mL (ref 0.00–0.08)

## 2015-07-25 LAB — INFLUENZA PANEL BY PCR (TYPE A & B)
H1N1 flu by pcr: NOT DETECTED
Influenza A By PCR: NEGATIVE
Influenza B By PCR: NEGATIVE

## 2015-07-25 LAB — I-STAT CG4 LACTIC ACID, ED: Lactic Acid, Venous: 0.98 mmol/L (ref 0.5–2.0)

## 2015-07-25 LAB — CBC WITH DIFFERENTIAL/PLATELET
Basophils Absolute: 0 10*3/uL (ref 0.0–0.1)
Basophils Relative: 0 %
Eosinophils Absolute: 0 10*3/uL (ref 0.0–0.7)
Eosinophils Relative: 0 %
HCT: 35.3 % — ABNORMAL LOW (ref 39.0–52.0)
Hemoglobin: 11.2 g/dL — ABNORMAL LOW (ref 13.0–17.0)
Lymphocytes Relative: 9 %
Lymphs Abs: 0.6 10*3/uL — ABNORMAL LOW (ref 0.7–4.0)
MCH: 26.7 pg (ref 26.0–34.0)
MCHC: 31.7 g/dL (ref 30.0–36.0)
MCV: 84 fL (ref 78.0–100.0)
Monocytes Absolute: 0.5 10*3/uL (ref 0.1–1.0)
Monocytes Relative: 8 %
Neutro Abs: 4.9 10*3/uL (ref 1.7–7.7)
Neutrophils Relative %: 83 %
Platelets: 141 10*3/uL — ABNORMAL LOW (ref 150–400)
RBC: 4.2 MIL/uL — ABNORMAL LOW (ref 4.22–5.81)
RDW: 15.7 % — ABNORMAL HIGH (ref 11.5–15.5)
WBC: 5.9 10*3/uL (ref 4.0–10.5)

## 2015-07-25 LAB — COMPREHENSIVE METABOLIC PANEL
ALT: 25 U/L (ref 17–63)
AST: 32 U/L (ref 15–41)
Albumin: 3.3 g/dL — ABNORMAL LOW (ref 3.5–5.0)
Alkaline Phosphatase: 51 U/L (ref 38–126)
Anion gap: 10 (ref 5–15)
BUN: 11 mg/dL (ref 6–20)
CO2: 24 mmol/L (ref 22–32)
Calcium: 8.5 mg/dL — ABNORMAL LOW (ref 8.9–10.3)
Chloride: 101 mmol/L (ref 101–111)
Creatinine, Ser: 1.12 mg/dL (ref 0.61–1.24)
GFR calc Af Amer: >60 mL/min (ref >60)
GFR calc non Af Amer: >60 mL/min (ref >60)
Glucose, Bld: 120 mg/dL — ABNORMAL HIGH (ref 65–99)
Potassium: 3.7 mmol/L (ref 3.5–5.1)
Sodium: 135 mmol/L (ref 135–145)
Total Bilirubin: 0.6 mg/dL (ref 0.3–1.2)
Total Protein: 6.4 g/dL — ABNORMAL LOW (ref 6.5–8.1)

## 2015-07-25 LAB — URINE MICROSCOPIC-ADD ON: WBC, UA: NONE SEEN WBC/hpf (ref 0–5)

## 2015-07-25 LAB — RAPID URINE DRUG SCREEN, HOSP PERFORMED
Amphetamines: NOT DETECTED
Barbiturates: NOT DETECTED
Benzodiazepines: NOT DETECTED
Cocaine: NOT DETECTED
Opiates: NOT DETECTED
Tetrahydrocannabinol: NOT DETECTED

## 2015-07-25 LAB — PROTIME-INR
INR: 1.26 (ref 0.00–1.49)
Prothrombin Time: 16 seconds — ABNORMAL HIGH (ref 11.6–15.2)

## 2015-07-25 LAB — AMMONIA: Ammonia: 27 umol/L (ref 9–35)

## 2015-07-25 LAB — ACETAMINOPHEN LEVEL: Acetaminophen (Tylenol), Serum: <10 ug/mL — ABNORMAL LOW (ref 10–30)

## 2015-07-25 LAB — GLUCOSE, CAPILLARY: Glucose-Capillary: 115 mg/dL — ABNORMAL HIGH (ref 65–99)

## 2015-07-25 LAB — PROCALCITONIN: Procalcitonin: <0.1 ng/mL

## 2015-07-25 LAB — LIPASE, BLOOD: Lipase: 28 U/L (ref 11–51)

## 2015-07-25 LAB — LACTIC ACID, PLASMA
Lactic Acid, Venous: 1.8 mmol/L (ref 0.5–2.0)
Lactic Acid, Venous: 1.3 mmol/L (ref 0.5–2.0)

## 2015-07-25 LAB — ETHANOL: Alcohol, Ethyl (B): <5 mg/dL (ref <5)

## 2015-07-25 MED ORDER — LISINOPRIL 20 MG PO TABS
20.0000 mg | ORAL_TABLET | Freq: Every day | ORAL | Status: DC
  Administered 2015-07-25 – 2015-07-26 (×2): 20 mg via ORAL
  Filled 2015-07-25 (×2): qty 1

## 2015-07-25 MED ORDER — ACETAMINOPHEN 500 MG PO TABS
1000.0000 mg | ORAL_TABLET | Freq: Once | ORAL | Status: AC
  Administered 2015-07-25: 1000 mg via ORAL
  Filled 2015-07-25: qty 2

## 2015-07-25 MED ORDER — SODIUM CHLORIDE 0.9 % IV SOLN
Freq: Once | INTRAVENOUS | Status: AC
  Administered 2015-07-25: 04:00:00 via INTRAVENOUS

## 2015-07-25 MED ORDER — METOPROLOL TARTRATE 1 MG/ML IV SOLN
2.5000 mg | INTRAVENOUS | Status: AC
  Administered 2015-07-25: 2.5 mg via INTRAVENOUS
  Filled 2015-07-25: qty 5

## 2015-07-25 MED ORDER — ALBUTEROL SULFATE (2.5 MG/3ML) 0.083% IN NEBU
2.5000 mg | INHALATION_SOLUTION | RESPIRATORY_TRACT | Status: DC | PRN
Start: 2015-07-25 — End: 2015-07-27

## 2015-07-25 MED ORDER — METOPROLOL TARTRATE 25 MG PO TABS: 50.0000 mg | ORAL_TABLET | Freq: Two times a day (BID) | ORAL | Status: DC

## 2015-07-25 MED ORDER — VITAMIN D 1000 UNITS PO TABS
5000.0000 [IU] | ORAL_TABLET | Freq: Every day | ORAL | Status: DC
  Administered 2015-07-25 – 2015-07-27 (×3): 5000 [IU] via ORAL
  Filled 2015-07-25 (×4): qty 5

## 2015-07-25 MED ORDER — PANTOPRAZOLE SODIUM 40 MG PO TBEC
40.0000 mg | DELAYED_RELEASE_TABLET | Freq: Every day | ORAL | Status: DC
  Administered 2015-07-25 – 2015-07-27 (×3): 40 mg via ORAL
  Filled 2015-07-25 (×3): qty 1

## 2015-07-25 MED ORDER — VANCOMYCIN HCL IN DEXTROSE 1-5 GM/200ML-% IV SOLN: 1000.0000 mg | Freq: Two times a day (BID) | INTRAVENOUS | Status: DC

## 2015-07-25 MED ORDER — PIPERACILLIN-TAZOBACTAM 3.375 G IVPB
3.3750 g | Freq: Three times a day (TID) | INTRAVENOUS | Status: DC
  Administered 2015-07-25 – 2015-07-27 (×6): 3.375 g via INTRAVENOUS
  Filled 2015-07-25 (×8): qty 50

## 2015-07-25 MED ORDER — PIPERACILLIN-TAZOBACTAM 3.375 G IVPB 30 MIN: 3.3750 g | Freq: Once | INTRAVENOUS | Status: DC

## 2015-07-25 MED ORDER — INFLUENZA VAC SPLIT QUAD 0.5 ML IM SUSY: 0.5000 mL | PREFILLED_SYRINGE | INTRAMUSCULAR | Status: DC | PRN

## 2015-07-25 MED ORDER — SODIUM CHLORIDE 0.9 % IV SOLN
1250.0000 mg | Freq: Two times a day (BID) | INTRAVENOUS | Status: DC
  Administered 2015-07-25 – 2015-07-27 (×4): 1250 mg via INTRAVENOUS
  Filled 2015-07-25 (×5): qty 1250

## 2015-07-25 MED ORDER — MAGNESIUM-POTASSIUM 40-40 MG PO CAPS: 1.0000 | ORAL_CAPSULE | Freq: Every day | ORAL | Status: DC

## 2015-07-25 MED ORDER — VANCOMYCIN HCL 10 G IV SOLR
1500.0000 mg | Freq: Once | INTRAVENOUS | Status: AC
  Administered 2015-07-25: 1500 mg via INTRAVENOUS
  Filled 2015-07-25: qty 1500

## 2015-07-25 MED ORDER — IBUPROFEN 800 MG PO TABS
800.0000 mg | ORAL_TABLET | Freq: Once | ORAL | Status: AC
Start: 2015-07-25 — End: 2015-07-25
  Administered 2015-07-25: 800 mg via ORAL
  Filled 2015-07-25: qty 1

## 2015-07-25 MED ORDER — APIXABAN 5 MG PO TABS
5.0000 mg | ORAL_TABLET | Freq: Two times a day (BID) | ORAL | Status: DC
  Filled 2015-07-25 (×2): qty 1

## 2015-07-25 MED ORDER — ONDANSETRON HCL 4 MG PO TABS: 4.0000 mg | ORAL_TABLET | Freq: Four times a day (QID) | ORAL | Status: DC | PRN

## 2015-07-25 MED ORDER — GUAIFENESIN ER 600 MG PO TB12
600.0000 mg | ORAL_TABLET | Freq: Two times a day (BID) | ORAL | Status: DC
  Administered 2015-07-25 – 2015-07-27 (×4): 600 mg via ORAL
  Filled 2015-07-25 (×4): qty 1

## 2015-07-25 MED ORDER — METOPROLOL TARTRATE 50 MG PO TABS
50.0000 mg | ORAL_TABLET | Freq: Two times a day (BID) | ORAL | Status: DC
  Administered 2015-07-25 – 2015-07-26 (×3): 50 mg via ORAL
  Filled 2015-07-25 (×3): qty 1

## 2015-07-25 MED ORDER — PNEUMOCOCCAL VAC POLYVALENT 25 MCG/0.5ML IJ INJ: 0.5000 mL | INJECTION | INTRAMUSCULAR | Status: DC | PRN

## 2015-07-25 MED ORDER — PIPERACILLIN-TAZOBACTAM 3.375 G IVPB 30 MIN
3.3750 g | Freq: Once | INTRAVENOUS | Status: AC
  Administered 2015-07-25: 3.375 g via INTRAVENOUS
  Filled 2015-07-25: qty 50

## 2015-07-25 MED ORDER — APIXABAN 5 MG PO TABS
5.0000 mg | ORAL_TABLET | Freq: Two times a day (BID) | ORAL | Status: DC
  Administered 2015-07-25 – 2015-07-27 (×4): 5 mg via ORAL
  Filled 2015-07-25 (×4): qty 1

## 2015-07-25 MED ORDER — VANCOMYCIN HCL IN DEXTROSE 1-5 GM/200ML-% IV SOLN: 1000.0000 mg | Freq: Once | INTRAVENOUS | Status: DC

## 2015-07-25 MED ORDER — SODIUM CHLORIDE 0.9 % IV BOLUS (SEPSIS)
1000.0000 mL | Freq: Once | INTRAVENOUS | Status: AC
  Administered 2015-07-25: 1000 mL via INTRAVENOUS

## 2015-07-25 MED ORDER — ONDANSETRON HCL 4 MG/2ML IJ SOLN: 4.0000 mg | Freq: Four times a day (QID) | INTRAMUSCULAR | Status: DC | PRN

## 2015-07-25 MED ORDER — SODIUM CHLORIDE 0.9 % IJ SOLN
3.0000 mL | Freq: Two times a day (BID) | INTRAMUSCULAR | Status: DC
  Administered 2015-07-26 – 2015-07-27 (×2): 3 mL via INTRAVENOUS

## 2015-07-25 NOTE — ED Notes (Signed)
No antibiotic orders placed, Dr.Oni made aware

## 2015-07-25 NOTE — Progress Notes (Addendum)
ANTIBIOTIC CONSULT NOTE - INITIAL  Pharmacy Consult for Vancocin and Zosyn Indication: rule out sepsis  Allergies  Allergen Reactions  . Statins Swelling and Other (See Comments)    Legs swelled up and cramped up really bad    Patient Measurements: Height: 5\' 6"  (167.6 cm) Weight: 201 lb 2 oz (91.23 kg) IBW/kg (Calculated) : 63.8  Vital Signs: Temp: 102 F (38.9 C) (12/22 0357) Temp Source: Rectal (12/22 0357) BP: 154/82 mmHg (12/22 0445) Pulse Rate: 133 (12/22 0445)  Labs:  Recent Labs  07/25/15 0208  WBC 5.9  HGB 11.2*  PLT 141*  CREATININE 1.12   Estimated Creatinine Clearance: 62.1 mL/min (by C-G formula based on Cr of 1.12).  Medical History: Past Medical History  Diagnosis Date  . Hypertension   . Shortness of breath dyspnea   . Prostate cancer ([place])   . Coronary artery disease      Assessment: 74yo male presents w/ AMS, concern for intra-abdominal source of infection 2/2 recent instrumentation, has signs of sepsis, to begin IV ABX.  Goal of Therapy:  Vancomycin trough level 15-20 mcg/ml  Plan:  Rec'd vanc 1500mg  and Zosyn 3.375g IV in ED; will continue with vancomycin 1250mg  IV Q12H and Zosyn 3.375g IV Q8H and monitor CBC, Cx, levels prn.  Wynona Neat, PharmD, BCPS  07/25/2015,4:54 AM

## 2015-07-25 NOTE — ED Provider Notes (Signed)
CSN: [id number]     Arrival date & time 07/25/15  0147 History  By signing my name below, I, Irene Pap, attest that this documentation has been prepared under the direction and in the presence of Everlene Balls, MD. Electronically Signed: Irene Pap, ED Scribe. 07/25/2015. 1:58 AM.    Chief Complaint  Patient presents with  . Altered Mental Status   The history is provided by the spouse. No language interpreter was used.  HPI Comments: Jim Dodson is a 74 y.o. Male with a hx of CAD, A-Fib and prostate cancer who presents to the Emergency Department complaining of altered mental status onset 5 hours ago. Wife states that the pt was complaining of tingling in his fingers initially. She states later that he kept asking her to take him home even though he was already at home. He was also asking about "who brought all this stuff here?" in reference to the items in their home. She asked him what year it was and he told her [birth year]. She states that this confusion is new today and denies hx of similar symptoms. Per triage note, wife states that the pt has had a lot of procedures recently, including a colonoscopy, "brain scan," bone density test and endoscopy. Wife states that she called [facility] who told her to bring him into the ED. Pt recently stopped taking Eliquis, per PCP's orders. Pt currently denies pain or productive cough.   Past Medical History  Diagnosis Date  . Hypertension   . Shortness of breath dyspnea   . Prostate cancer ([place])   . Coronary artery disease    Past Surgical History  Procedure Laterality Date  . Laparotomy N/A 03/08/2015    Procedure: EXPLORATORY LAPAROTOMY;  Surgeon: Autumn Messing III, MD;  Location: [place];  Service: General;  Laterality: N/A;  . Ventral hernia repair N/A 03/08/2015    Procedure:  VENTRAL HERNIA  REPAIR ADULT;  Surgeon: Autumn Messing III, MD;  Location: [place];  Service: General;  Laterality: N/A;  . Lysis of adhesion N/A 03/08/2015    Procedure:  LYSIS OF ADHESION;  Surgeon: Autumn Messing III, MD;  Location: [place];  Service: General;  Laterality: N/A;  . Omentectomy N/A 03/08/2015    Procedure: OMENTECTOMY;  Surgeon: Autumn Messing III, MD;  Location: [place];  Service: General;  Laterality: N/A;  . Umbilical hernia repair  03/08/2015    Procedure: UMBILICAL HERNIA REPAIR  ADULT;  Surgeon: Autumn Messing III, MD;  Location: [place];  Service: General;;  . Laparotomy N/A 03/09/2015    Procedure: EXPLORATORY LAPAROTOMY AND EVACUATION OF HEMATOMA;  Surgeon: Autumn Messing III, MD;  Location: [place];  Service: General;  Laterality: N/A;  . Tee without cardioversion N/A 03/13/2015    Procedure: TRANSESOPHAGEAL ECHOCARDIOGRAM (TEE);  Surgeon: Jerline Pain, MD;  Location: [place];  Service: Cardiovascular;  Laterality: N/A;  . Cardioversion N/A 03/13/2015    Procedure: CARDIOVERSION;  Surgeon: Jerline Pain, MD;  Location[facility] ENDOSCOPY;  Service: Cardiovascular;  Laterality: N/A;  . Cryo surgery of prostate    . Colonoscopy with propofol N/A 07/19/2015    Procedure: COLONOSCOPY WITH PROPOFOL;  Surgeon: Manya Silvas, MD;  Location: [facility] ENDOSCOPY;  Service: Endoscopy;  Laterality: N/A;  . Esophagogastroduodenoscopy (egd) with propofol N/A 07/19/2015    Procedure: ESOPHAGOGASTRODUODENOSCOPY (EGD) WITH PROPOFOL;  Surgeon: Manya Silvas, MD;  Location: [facility] ENDOSCOPY;  Service: Endoscopy;  Laterality: N/A;   No family history on file. Social History  Substance  Use Topics  . Smoking status: Never Smoker   . Smokeless tobacco: Never Used  . Alcohol Use: No    Review of Systems  10 Systems reviewed and all are negative for acute change except as noted in the HPI.  Allergies  Statins  Home Medications   Prior to Admission medications   Medication Sig Start Date End Date Taking? Authorizing Provider  acetaminophen (TYLENOL) 500 MG tablet Take 2 tablets (1,000 mg total) by mouth every 6 (six) hours as needed for moderate pain, fever or headache. 03/14/15    Nat Christen, PA-C  apixaban (ELIQUIS) 5 MG TABS tablet Take 5 mg by mouth 2 (two) times daily.    Historical Provider, MD  Hyaluronic Acid-Vitamin C (HYALURONIC ACID PO) Take 100 mg by mouth daily at 12 noon.    Historical Provider, MD  lisinopril (PRINIVIL,ZESTRIL) 20 MG tablet Take 20 mg by mouth daily.    Historical Provider, MD  MAGNESIUM-POTASSIUM PO Take 1 tablet by mouth daily at 12 noon.    Historical Provider, MD  meclizine (ANTIVERT) 12.5 MG tablet Take 12.5 mg by mouth 3 (three) times daily as needed for dizziness.    Historical Provider, MD  metoprolol (LOPRESSOR) 50 MG tablet Take 1 tablet (50 mg total) by mouth 2 (two) times daily. 03/14/15   Nat Christen, PA-C  Nutritional Supplements (NUTRITIONAL SUPPLEMENT PO) Take 6 tablets by mouth 2 (two) times daily. [place]: 40% ketosterones/ 20% 3-ketosterone    Historical Provider, MD  omeprazole (PRILOSEC) 20 MG capsule Take 20 mg by mouth daily.    Historical Provider, MD  oxyCODONE-acetaminophen (PERCOCET/ROXICET) 5-325 MG per tablet Take 1-2 tablets by mouth every 6 (six) hours as needed for moderate pain. 03/14/15   Nat Christen, PA-C  Red Yeast Rice Extract 600 MG CAPS Take 1,200 mg by mouth 2 (two) times daily.    Historical Provider, MD   BP 162/115 mmHg  Pulse 128  Temp(Src) 98.5 F (36.9 C) (Oral)  Resp 20  Ht 5\' 6"  (1.676 m)  Wt 201 lb 2 oz (91.23 kg)  BMI 32.48 kg/m2  SpO2 96% Physical Exam  Constitutional: Vital signs are normal. He appears well-developed and well-nourished.  Non-toxic appearance. He does not appear ill. No distress.  Tactile fever  HENT:  Head: Normocephalic and atraumatic.  Nose: Nose normal.  Mouth/Throat: Oropharynx is clear and moist. No oropharyngeal exudate.  Eyes: Conjunctivae and EOM are normal. Pupils are equal, round, and reactive to light. No scleral icterus.  Neck: Normal range of motion. Neck supple. No tracheal deviation, no edema, no erythema and normal range of motion present. No  thyroid mass and no thyromegaly present.  Cardiovascular: Regular rhythm, S1 normal, S2 normal, normal heart sounds, intact distal pulses and normal pulses.  Tachycardia present.  Exam reveals no gallop and no friction rub.   No murmur heard. Pulmonary/Chest: Effort normal and breath sounds normal. Tachypnea noted. No respiratory distress. He has no wheezes. He has no rhonchi. He has no rales.  Abdominal: Soft. Normal appearance and bowel sounds are normal. He exhibits no distension, no ascites and no mass. There is no hepatosplenomegaly. There is no tenderness. There is no rebound, no guarding and no CVA tenderness.  Musculoskeletal: Normal range of motion. He exhibits no edema or tenderness.  Lymphadenopathy:    He has no cervical adenopathy.  Neurological: He is alert. He has normal strength. No cranial nerve deficit or sensory deficit.  Normal strength and sensation in all extremities  Skin: Skin is warm, dry and intact. No petechiae and no rash noted. He is not diaphoretic. No erythema. No pallor.  Psychiatric: He has a normal mood and affect. His behavior is normal. Judgment normal.  Nursing note and vitals reviewed.   ED Course  Procedures (including critical care time) DIAGNOSTIC STUDIES: Oxygen Saturation is 96% on RA, normal by my interpretation.    COORDINATION OF CARE: 2:07 AM-Discussed treatment plan which includes labs, x-ray and CT with pt at bedside and pt agreed to plan.    Labs Review Labs Reviewed  CBC WITH DIFFERENTIAL/PLATELET - Abnormal; Notable for the following:    RBC 4.20 (*)    Hemoglobin 11.2 (*)    HCT 35.3 (*)    RDW 15.7 (*)    Platelets 141 (*)    Lymphs Abs 0.6 (*)    All other components within normal limits  COMPREHENSIVE METABOLIC PANEL - Abnormal; Notable for the following:    Glucose, Bld 120 (*)    Calcium 8.5 (*)    Total Protein 6.4 (*)    Albumin 3.3 (*)    All other components within normal limits  PROTIME-INR - Abnormal; Notable for  the following:    Prothrombin Time 16.0 (*)    All other components within normal limits  URINALYSIS, ROUTINE W REFLEX MICROSCOPIC (NOT AT [facility]) - Abnormal; Notable for the following:    Hgb urine dipstick TRACE (*)    Ketones, ur 15 (*)    Protein, ur 30 (*)    All other components within normal limits  ACETAMINOPHEN LEVEL - Abnormal; Notable for the following:    Acetaminophen (Tylenol), Serum <10 (*)    All other components within normal limits  URINE MICROSCOPIC-ADD ON - Abnormal; Notable for the following:    Squamous Epithelial / LPF 0-5 (*)    Bacteria, UA RARE (*)    All other components within normal limits  CULTURE, BLOOD (ROUTINE X 2)  CULTURE, BLOOD (ROUTINE X 2)  URINE CULTURE  URINE CULTURE  LIPASE, BLOOD  URINE RAPID DRUG SCREEN, HOSP PERFORMED  ETHANOL  AMMONIA  SALICYLATE LEVEL  I-STAT CG4 LACTIC ACID, ED  Randolm Idol, ED    Imaging Review Dg Chest 2 View  07/25/2015  CLINICAL DATA:  74 year old male with altered mental status, and cough. EXAM: CHEST  2 VIEW COMPARISON:  None. FINDINGS: Two view of the chest do not demonstrate any focal consolidation. There is no pleural effusion or pneumothorax. Mild cardiomegaly. The osseous structures are grossly unremarkable with IMPRESSION: No active cardiopulmonary disease. Electronically Signed   By: Anner Crete M.D.   On: 07/25/2015 03:13   Ct Head Wo Contrast  07/25/2015  CLINICAL DATA:  74 year old male with altered mental status EXAM: CT HEAD WITHOUT CONTRAST TECHNIQUE: Contiguous axial images were obtained from the base of the skull through the vertex without intravenous contrast. COMPARISON:  Head CT dated 05/28/2015 FINDINGS: The ventricles are dilated and the sulci are prominent compatible with age-related atrophy. Periventricular and deep white matter hypodensities represent chronic microvascular ischemic changes. There is no intracranial hemorrhage. No mass effect or midline shift identified. Mild  mucoperiosteal thickening of paranasal sinuses. The mastoid air cells are well aerated. There is stable focal right temporal craniectomy defect. No acute calvarial fracture. IMPRESSION: No acute intracranial hemorrhage. Age-related atrophy and chronic microvascular ischemic disease. If symptoms persist and there are no contraindications, MRI may provide better evaluation if clinically indicated. Electronically Signed   By: Laren Everts.D.  On: 07/25/2015 02:59   I have personally reviewed and evaluated these images and lab results as part of my medical decision-making.   EKG Interpretation   Date/Time:  Thursday July 25 2015 02:09:30 EST Ventricular Rate:  120 PR Interval:    QRS Duration: 95 QT Interval:  346 QTC Calculation: 489 R Axis:   18 Text Interpretation:  Atrial fibrillation Probable anteroseptal infarct,  old Borderline ST depression, diffuse leads a fib now present Confirmed by  Glynn Octave [phone]) on 07/25/2015 2:39:39 AM      MDM   Final diagnoses:  None    Patient presents to the ED for AMS.  He is septic with a fever and tachycardia.  He was given tylenol and IVF.  Cultures drawn, patient given broad spectum antibiotics as well.  He was admitted to hospitalist for further care.  CXR and urine neg for infection.  Labs and CT head do not show cause for AMS.  He may intra-abdominal infection due to recent instrumentation.  This was relayed to the hospitalist, Dr. Tamala Julian.     I personally performed the services described in this documentation, which was scribed in my presence. The recorded information has been reviewed and is accurate.      Everlene Balls, MD 07/25/15 [phone]

## 2015-07-25 NOTE — ED Notes (Signed)
Pt taken to CT.

## 2015-07-25 NOTE — ED Notes (Signed)
Per wife, pt had an onset of confusion tonight, unable to recall year (stated 1960) and unable to recall where pt was at. Pt reports having a hernia repair in August and a recent endoscopy.

## 2015-07-25 NOTE — Progress Notes (Signed)
Admission note:   Arrival Method: Pt arrived to unit on stretcher from ED  Mental Orientation: Alert and oriented x 4 Telemetry: Telemetry box 6e11 applied, CCMT notified. Verified by Arlie Solomons, NT Assessment: Completed, see flowsheets Skin: Dry and intact IV: Left AC. Site is clean, dry and intact Pain: Pt states no pain at this time Tubes: N/A Safety Measures: Bed in lowest position, non-slip socks placed, bed alarm activated, call light within reach Fall Prevention Safety Plan: Reviewed with patient Admission Screening: Completed 6700 Orientation: Patient has been oriented to the unit, staff and to the room. Patient in lying comfortably in bed with wife at bedside and no further needs stated at this time. Orders have been reviewed and implemented. Call light within reach, will continue to monitor.  Shelbie Hutching, RN, BSN

## 2015-07-25 NOTE — H&P (Addendum)
[facility] History and Physical  Jim Dodson [id number] DOB: [birth year] DOA: 07/25/2015  Referring physician: ED PCP: Precious Reel, MD   Chief Complaint: Confusion  HPI:  Patient is a 74 year old male with a past medical history significant for atrial fibrillation previously on Eliquis, HTN, CAD s/p stent, history of prostate cancer s/p cryotherapy last PSA less than 6 months ago 5;  who presents with confusion. History is obtained by the patient's wife as patient is noted to be acutely altered and confused. She notes that he started initially complaining of tingling in his fingers and across his shoulders yesterday night. Then he started asking when he was going to go home although he was at home. She notes multiple other things which she associated with him being confused and not his normal self including stating the wrong year, trying to go to work at night when he works days, and asking whose house he was in. Patient denies any focal weakness, slurred speech, chest pain, shortness of breath, palpitations, or abdominal pain.   Wife notes that he had recent EGD and colonoscopy on 12/16. Following the procedure he was told that he had diverticulosis and 5 polyps were removed. He had been told to stop his Eliquis for the procedure,  but not advised on when to restart this medication. 2 days following the procedure the patient was noted to start feeling under the weather now complaining of sinus congestion and cough. Cough was nonproductive. He tried over-the-counter cetirizine without relief of symptoms.  His wife has also been under the weather is well with similar symptoms.   Upon arrival to the emergency department patient was found to be in A. Fib with heart rates in the 130s,  and had a fever of 103.43F. Initial UA and chest x-ray appear within normal limits. CT of the head was negative for any acute stroke.    Review of Systems  Constitutional: Positive for fever.  HENT:  Positive for congestion. Negative for hearing loss.   Eyes: Negative for photophobia and pain.  Respiratory: Positive for cough. Negative for sputum production, shortness of breath and wheezing.   Cardiovascular: Negative for chest pain and palpitations.  Gastrointestinal: Negative for abdominal pain, diarrhea and constipation.  Genitourinary: Negative for dysuria and hematuria.  Musculoskeletal: Negative for falls and neck pain.  Skin: Negative for itching and rash.  Neurological: Positive for headaches. Negative for speech change and focal weakness.  Endo/Heme/Allergies: Negative for polydipsia. Bruises/bleeds easily.  Psychiatric/Behavioral:       Confusion       Past Medical History  Diagnosis Date  . Hypertension   . Shortness of breath dyspnea   . Prostate cancer ([place])   . Coronary artery disease      Past Surgical History  Procedure Laterality Date  . Laparotomy N/A 03/08/2015    Procedure: EXPLORATORY LAPAROTOMY;  Surgeon: Autumn Messing III, MD;  Location: [place];  Service: General;  Laterality: N/A;  . Ventral hernia repair N/A 03/08/2015    Procedure:  VENTRAL HERNIA  REPAIR ADULT;  Surgeon: Autumn Messing III, MD;  Location: [place];  Service: General;  Laterality: N/A;  . Lysis of adhesion N/A 03/08/2015    Procedure: LYSIS OF ADHESION;  Surgeon: Autumn Messing III, MD;  Location: [place];  Service: General;  Laterality: N/A;  . Omentectomy N/A 03/08/2015    Procedure: OMENTECTOMY;  Surgeon: Autumn Messing III, MD;  Location: [place];  Service: General;  Laterality: N/A;  . Umbilical hernia repair  03/08/2015    Procedure: UMBILICAL HERNIA REPAIR  ADULT;  Surgeon: Autumn Messing III, MD;  Location: [place];  Service: General;;  . Laparotomy N/A 03/09/2015    Procedure: EXPLORATORY LAPAROTOMY AND EVACUATION OF HEMATOMA;  Surgeon: Autumn Messing III, MD;  Location: [place];  Service: General;  Laterality: N/A;  . Tee without cardioversion N/A 03/13/2015    Procedure: TRANSESOPHAGEAL ECHOCARDIOGRAM (TEE);  Surgeon:  Jerline Pain, MD;  Location: [place];  Service: Cardiovascular;  Laterality: N/A;  . Cardioversion N/A 03/13/2015    Procedure: CARDIOVERSION;  Surgeon: Jerline Pain, MD;  Location: [facility] ENDOSCOPY;  Service: Cardiovascular;  Laterality: N/A;  . Cryo surgery of prostate    . Colonoscopy with propofol N/A 07/19/2015    Procedure: COLONOSCOPY WITH PROPOFOL;  Surgeon: Manya Silvas, MD;  Location: [facility] ENDOSCOPY;  Service: Endoscopy;  Laterality: N/A;  . Esophagogastroduodenoscopy (egd) with propofol N/A 07/19/2015    Procedure: ESOPHAGOGASTRODUODENOSCOPY (EGD) WITH PROPOFOL;  Surgeon: Manya Silvas, MD;  Location: [facility] ENDOSCOPY;  Service: Endoscopy;  Laterality: N/A;      Social History:  reports that he has never smoked. He has never used smokeless tobacco. He reports that he does not drink alcohol or use illicit drugs. Where does patient live--home  and with whom if at home?Wife Can patient participate in ADLs? Yes  Allergies  Allergen Reactions  . Statins Swelling and Other (See Comments)    Legs swelled up and cramped up really bad    No family history on file.      Prior to Admission medications   Medication Sig Start Date End Date Taking? Authorizing Provider  acetaminophen (TYLENOL) 500 MG tablet Take 2 tablets (1,000 mg total) by mouth every 6 (six) hours as needed for moderate pain, fever or headache. 03/14/15  Yes Nat Christen, PA-C  cholecalciferol (VITAMIN D) 1000 UNITS tablet Take 5,000 Units by mouth daily.   Yes Historical Provider, MD  lisinopril (PRINIVIL,ZESTRIL) 20 MG tablet Take 20 mg by mouth daily.   Yes Historical Provider, MD  MAGNESIUM-POTASSIUM PO Take 1 tablet by mouth daily at 12 noon.   Yes Historical Provider, MD  meclizine (ANTIVERT) 12.5 MG tablet Take 12.5 mg by mouth 3 (three) times daily as needed for dizziness.   Yes Historical Provider, MD  metoprolol (LOPRESSOR) 50 MG tablet Take 1 tablet (50 mg total) by mouth 2 (two) times daily.  03/14/15  Yes Nat Christen, PA-C  Nutritional Supplements (NUTRITIONAL SUPPLEMENT PO) Take 6 tablets by mouth 2 (two) times daily. [place]: 40% ketosterones/ 20% 3-ketosterone   Yes Historical Provider, MD  omeprazole (PRILOSEC) 20 MG capsule Take 20 mg by mouth daily.   Yes Historical Provider, MD  Red Yeast Rice Extract 600 MG CAPS Take 1,200 mg by mouth 2 (two) times daily.   Yes Historical Provider, MD  apixaban (ELIQUIS) 5 MG TABS tablet Take 5 mg by mouth 2 (two) times daily.    Historical Provider, MD     Physical Exam: Filed Vitals:   07/25/15 0300 07/25/15 0330 07/25/15 0345 07/25/15 0357  BP: 153/89 144/97 156/107   Pulse: 108 121 126   Temp:    102 F (38.9 C)  TempSrc:    Rectal  Resp: 24 29 24    Height:      Weight:      SpO2: 94% 94% 93%      Constitutional: Vital signs reviewed. Patient is a well-developed and well-nourished in no acute distress and cooperative with  exam. Alert and oriented x2 Head: Normocephalic and atraumatic  Ear: TM normal bilaterally  Mouth: no erythema or exudates, MMM  Eyes: PERRL, EOMI, conjunctivae normal, No scleral icterus.  Neck: Supple, Trachea midline normal ROM, No JVD, mass, thyromegaly, or carotid bruit present.  Cardiovascular:  Irregular irregular rhythm Pulmonary/Chest: CTAB, no wheezes, rales, or rhonchi  Abdominal: Soft. Non-tender, non-distended, bowel sounds are normal, no masses, organomegaly, or guarding present.  GU: no CVA tenderness Musculoskeletal: No joint deformities, erythema, or stiffness, ROM full and no nontender Ext: no edema and no cyanosis, pulses palpable bilaterally (DP and PT)  Hematology: no cervical, inginal, or axillary adenopathy.  Neurological: A&O 2, Strenght is normal and symmetric bilaterally, cranial nerve II-XII are grossly intact, no focal motor deficit, sensory intact to light touch bilaterally.  Skin: Warm, dry and intact. No rash, cyanosis, or clubbing.  Psychiatric: Normal mood and affect.  speech and behavior is normal. Judgment and thought content normal. Cognition and memory are normal.      Data Review   Micro Results No results found for this or any previous visit (from the past 240 hour(s)).  Radiology Reports Dg Chest 2 View  07/25/2015  CLINICAL DATA:  74 year old male with altered mental status, and cough. EXAM: CHEST  2 VIEW COMPARISON:  None. FINDINGS: Two view of the chest do not demonstrate any focal consolidation. There is no pleural effusion or pneumothorax. Mild cardiomegaly. The osseous structures are grossly unremarkable with IMPRESSION: No active cardiopulmonary disease. Electronically Signed   By: Anner Crete M.D.   On: 07/25/2015 03:13   Ct Head Wo Contrast  07/25/2015  CLINICAL DATA:  74 year old male with altered mental status EXAM: CT HEAD WITHOUT CONTRAST TECHNIQUE: Contiguous axial images were obtained from the base of the skull through the vertex without intravenous contrast. COMPARISON:  Head CT dated 05/28/2015 FINDINGS: The ventricles are dilated and the sulci are prominent compatible with age-related atrophy. Periventricular and deep white matter hypodensities represent chronic microvascular ischemic changes. There is no intracranial hemorrhage. No mass effect or midline shift identified. Mild mucoperiosteal thickening of paranasal sinuses. The mastoid air cells are well aerated. There is stable focal right temporal craniectomy defect. No acute calvarial fracture. IMPRESSION: No acute intracranial hemorrhage. Age-related atrophy and chronic microvascular ischemic disease. If symptoms persist and there are no contraindications, MRI may provide better evaluation if clinically indicated. Electronically Signed   By: Anner Crete M.D.   On: 07/25/2015 02:59     CBC  Recent Labs Lab 07/25/15 0208  WBC 5.9  HGB 11.2*  HCT 35.3*  PLT 141*  MCV 84.0  MCH 26.7  MCHC 31.7  RDW 15.7*  LYMPHSABS 0.6*  MONOABS 0.5  EOSABS 0.0  BASOSABS 0.0     Chemistries   Recent Labs Lab 07/25/15 0208  NA 135  K 3.7  CL 101  CO2 24  GLUCOSE 120*  BUN 11  CREATININE 1.12  CALCIUM 8.5*  AST 32  ALT 25  ALKPHOS 51  BILITOT 0.6   ------------------------------------------------------------------------------------------------------------------ estimated creatinine clearance is 62.1 mL/min (by C-G formula based on Cr of 1.12). ------------------------------------------------------------------------------------------------------------------ No results for input(s): HGBA1C in the last 72 hours. ------------------------------------------------------------------------------------------------------------------ No results for input(s): CHOL, HDL, LDLCALC, TRIG, CHOLHDL, LDLDIRECT in the last 72 hours. ------------------------------------------------------------------------------------------------------------------ No results for input(s): TSH, T4TOTAL, T3FREE, THYROIDAB in the last 72 hours.  Invalid input(s): FREET3 ------------------------------------------------------------------------------------------------------------------ No results for input(s): VITAMINB12, FOLATE, FERRITIN, TIBC, IRON, RETICCTPCT in the last 72 hours.  Coagulation profile  Recent Labs  Lab 07/25/15 0208  INR 1.26    No results for input(s): DDIMER in the last 72 hours.  Cardiac Enzymes No results for input(s): CKMB, TROPONINI, MYOGLOBIN in the last 168 hours.  Invalid input(s): CK ------------------------------------------------------------------------------------------------------------------ Invalid input(s): POCBNP   CBG: No results for input(s): GLUCAP in the last 168 hours.     EKG: Independently reviewed: Atrial fibrillation with a rate of 120  Assessment/Plan SIRS/Suspected sepsis: Patient presents with confusion. SIRS criteria with initial heart rates in the 120s, respiratory rate 29, and fever 103F.  WBC 5.9, lactic acid 0.98.  UA  and chest x-ray appeared to be negative for any acute signs of infection. Considering patient's recent EGD and colonoscopy would suspected patient is bacteremic.  - Admit to telemetry bed  - Empiric antibiotics of Vanc and Zosyn  - Follow-up blood cultures - influenza screen - tylenol for fever  Acute encephalopathy: CT of head showed no acute abnormalities. Urine drug screen negative and Ammonia level within normal limits. Likely secondary to acute infection.  - neuro checks q2 - may warrant MRI if not seen to improve back to patient's baseline  Atrial fibrillation with rapid ventricular response: Chronic. Last echocardiogram in 03/2015 where cardiology tried to cardiovert patient, but was unsuccessful. Chadvasc score 3. Patient with heart rates in the 120s to 140s On admission. Wife unsure if patient took his evening dose of metoprolol which usually controls his rate. Patient was noted to have been stopped on his Eliquis, but it appears he wishes not told one to restart following the procedure.   - Restart Eliquis now  - Metoprolol IV 2.5 mg 1 dose stat   - Restart home dose of metoprolol 50 mg  Now  Hypertension:  -Continue lisinopril   Upper respiratory infection:  acute. Patient with symptoms of congestion, postnasal drip, and cough over the last few days . - Mucinex   - Claritin  Anemia: hemoglobin noted to be 11.2 g/dl. Patient reports that colonoscopy was done for a one-time episode of blood in toilet bowl.  - Continue to monitor   GERD -Therapeutic substitution for Protonix  Chronic coronary artery disease s/p stent: stable - Follow-up troponins   History of prostate cancer status post treatment. Wife notes last PSA done within the last 6 months was 5.   Code Status:   full Family Communication: bedside Disposition Plan: admit   Total time spent 55 minutes.Greater than 50% of this time was spent in counseling, explanation of diagnosis, planning of further management,  and coordination of care  [facility] Pager [phone]  If 7PM-7AM, please contact night-coverage [email or web address] Password [facility] 07/25/2015, 4:25 AM

## 2015-07-25 NOTE — Progress Notes (Signed)
PATIENT DETAILS Name: Jim Dodson Age: 74 y.o. Sex: male Date of Birth: 1940/10/31 Admit Date: 07/25/2015 Admitting Physician Norval Morton, MD [id number] M, MD  Subjective: Much better-completely awake and alert  Assessment/Plan: Principal Problem: Sepsis: Etiology remains unknown-has some mild URI symotoms, but sepsis pathophysiology significantly improved. UA/chest x-ray negative. Continue empiric antibiotics, await culture data.. Await influenza PCR.  Active Problems:  Acute encephalopathy: Resolved, likely secondary to sepsis of unknown etiology. CT head negative for acute intracranial abnormality. Continue cardiac antibiotics, await culture data.  A. fib RVR: Rate better controlled, continue metoprolol.On Eliquis-Chadvasc score 3  Hypertension: Controlled, continue metoprolol and lisinopril  Anemia: Hemoglobin stable, is post recent colonoscopy on 12/16-polypectomies. Biopsy negative.  GERD (gastroesophageal reflux disease): Continue PPI  Disposition: Remain inpatient  Antimicrobial agents  See below  Anti-infectives    Start     Dose/Rate Route Frequency Ordered Stop   07/25/15 1600  vancomycin (VANCOCIN) IVPB 1000 mg/200 mL premix  Status:  Discontinued     1,000 mg 200 mL/hr over 60 Minutes Intravenous Every 12 hours 07/25/15 0459 07/25/15 0501   07/25/15 1600  vancomycin (VANCOCIN) 1,250 mg in sodium chloride 0.9 % 250 mL IVPB     1,250 mg 166.7 mL/hr over 90 Minutes Intravenous Every 12 hours 07/25/15 0501     07/25/15 0800  piperacillin-tazobactam (ZOSYN) IVPB 3.375 g     3.375 g 12.5 mL/hr over 240 Minutes Intravenous Every 8 hours 07/25/15 0458     07/25/15 0545  piperacillin-tazobactam (ZOSYN) IVPB 3.375 g  Status:  Discontinued     3.375 g 100 mL/hr over 30 Minutes Intravenous  Once 07/25/15 0539 07/25/15 0541   07/25/15 0545  vancomycin (VANCOCIN) IVPB 1000 mg/200 mL premix  Status:  Discontinued     1,000 mg 200 mL/hr  over 60 Minutes Intravenous  Once 07/25/15 0539 07/25/15 0541   07/25/15 0300  piperacillin-tazobactam (ZOSYN) IVPB 3.375 g     3.375 g 100 mL/hr over 30 Minutes Intravenous  Once 07/25/15 0256 07/25/15 0333   07/25/15 0300  vancomycin (VANCOCIN) 1,500 mg in sodium chloride 0.9 % 500 mL IVPB     1,500 mg 250 mL/hr over 120 Minutes Intravenous  Once 07/25/15 0256 07/25/15 0511      DVT Prophylaxis: Eliquis  Code Status: Full code   Family Communication Spouse at bedside  Procedures: None  CONSULTS:  None  Time spent 30 minutes-Greater than 50% of this time was spent in counseling, explanation of diagnosis, planning of further management, and coordination of care.  MEDICATIONS: Scheduled Meds: . apixaban  5 mg Oral BID  . cholecalciferol  5,000 Units Oral Daily  . lisinopril  20 mg Oral Daily  . metoprolol  50 mg Oral BID  . pantoprazole  40 mg Oral Daily  . piperacillin-tazobactam (ZOSYN)  IV  3.375 g Intravenous Q8H  . sodium chloride  3 mL Intravenous Q12H  . vancomycin  1,250 mg Intravenous Q12H   Continuous Infusions:  PRN Meds:.Influenza vac split quadrivalent PF, ondansetron **OR** ondansetron (ZOFRAN) IV, pneumococcal 23 valent vaccine    PHYSICAL EXAM: Vital signs in last 24 hours: Filed Vitals:   07/25/15 0504 07/25/15 0515 07/25/15 0542 07/25/15 0807  BP:   135/87 142/90  Pulse: 70 62 105 67  Temp:   99 F (37.2 C) 98.4 F (36.9 C)  TempSrc:   Oral Oral  Resp: 22 28 20  19  Height:   5\' 6"  (1.676 m)   Weight:   90.855 kg (200 lb 4.8 oz)   SpO2: 97%  95% 96%    Weight change:  Filed Weights   07/25/15 0154 07/25/15 0542  Weight: 91.23 kg (201 lb 2 oz) 90.855 kg (200 lb 4.8 oz)   Body mass index is 32.34 kg/(m^2).   Gen Exam: Awake and alert with clear speech.   Neck: Supple, No JVD.   Chest: B/L Clear-few scattered rhonchi CVS: S1 S2 Regular, no murmurs.  Abdomen: soft, BS +, non tender, non distended.  Extremities: no edema, lower  extremities warm to touch. Neurologic: Non Focal.   Skin: No Rash.   Wounds: N/A.  Intake/Output from previous day:  Intake/Output Summary (Last 24 hours) at 07/25/15 1500 Last data filed at 07/25/15 1436  Gross per 24 hour  Intake   2720 ml  Output   1175 ml  Net   1545 ml     LAB RESULTS: CBC  Recent Labs Lab 07/25/15 0208  WBC 5.9  HGB 11.2*  HCT 35.3*  PLT 141*  MCV 84.0  MCH 26.7  MCHC 31.7  RDW 15.7*  LYMPHSABS 0.6*  MONOABS 0.5  EOSABS 0.0  BASOSABS 0.0    Chemistries   Recent Labs Lab 07/25/15 0208  NA 135  K 3.7  CL 101  CO2 24  GLUCOSE 120*  BUN 11  CREATININE 1.12  CALCIUM 8.5*    CBG:  Recent Labs Lab 07/25/15 0807  GLUCAP 115*    GFR Estimated Creatinine Clearance: 62 mL/min (by C-G formula based on Cr of 1.12).  Coagulation profile  Recent Labs Lab 07/25/15 0208  INR 1.26    Cardiac Enzymes  Recent Labs Lab 07/25/15 0710 07/25/15 0820 07/25/15 1110  TROPONINI 0.05* 0.04* 0.04*    Invalid input(s): POCBNP No results for input(s): DDIMER in the last 72 hours. No results for input(s): HGBA1C in the last 72 hours. No results for input(s): CHOL, HDL, LDLCALC, TRIG, CHOLHDL, LDLDIRECT in the last 72 hours. No results for input(s): TSH, T4TOTAL, T3FREE, THYROIDAB in the last 72 hours.  Invalid input(s): FREET3 No results for input(s): VITAMINB12, FOLATE, FERRITIN, TIBC, IRON, RETICCTPCT in the last 72 hours.  Recent Labs  07/25/15 0208  LIPASE 28    Urine Studies No results for input(s): UHGB, CRYS in the last 72 hours.  Invalid input(s): UACOL, UAPR, USPG, UPH, UTP, UGL, UKET, UBIL, UNIT, UROB, ULEU, UEPI, UWBC, URBC, UBAC, CAST, UCOM, BILUA  MICROBIOLOGY: No results found for this or any previous visit (from the past 240 hour(s)).  RADIOLOGY STUDIES/RESULTS: Dg Chest 2 View  07/25/2015  CLINICAL DATA:  74 year old male with altered mental status, and cough. EXAM: CHEST  2 VIEW COMPARISON:  None.  FINDINGS: Two view of the chest do not demonstrate any focal consolidation. There is no pleural effusion or pneumothorax. Mild cardiomegaly. The osseous structures are grossly unremarkable with IMPRESSION: No active cardiopulmonary disease. Electronically Signed   By: Anner Crete M.D.   On: 07/25/2015 03:13   Ct Head Wo Contrast  07/25/2015  CLINICAL DATA:  74 year old male with altered mental status EXAM: CT HEAD WITHOUT CONTRAST TECHNIQUE: Contiguous axial images were obtained from the base of the skull through the vertex without intravenous contrast. COMPARISON:  Head CT dated 05/28/2015 FINDINGS: The ventricles are dilated and the sulci are prominent compatible with age-related atrophy. Periventricular and deep white matter hypodensities represent chronic microvascular ischemic changes. There is no intracranial hemorrhage. No  mass effect or midline shift identified. Mild mucoperiosteal thickening of paranasal sinuses. The mastoid air cells are well aerated. There is stable focal right temporal craniectomy defect. No acute calvarial fracture. IMPRESSION: No acute intracranial hemorrhage. Age-related atrophy and chronic microvascular ischemic disease. If symptoms persist and there are no contraindications, MRI may provide better evaluation if clinically indicated. Electronically Signed   By: Anner Crete M.D.   On: 07/25/2015 02:59    Oren Binet, MD  [facility] Pager:[phone]  If 7PM-7AM, please contact night-coverage [email or web address] Password [email or web address] 07/25/2015, 3:00 PM   LOS: 0 days

## 2015-07-25 NOTE — ED Notes (Signed)
Admitting MD at bedside.

## 2015-07-25 NOTE — ED Notes (Signed)
Wife reports he has been having a lot of procedures done and recently he has been altered.  She stated he did not even no when he lived.

## 2015-07-25 NOTE — Progress Notes (Signed)
PT Screen/Discharge Note  Patient Details Name: Jim Dodson MRN: [id number] DOB: 1941-03-08   Cancelled Treatment:    Reason Eval/Treat Not Completed: PT screened, no needs identified, will sign off   Patient is functioning at a high level of independence and no physical therapy is indicated at this time. Tolerates higher level dynamic gait tasks without loss of balance including high marching, quick turns, variable speeds, and backwards stepping. Pt and family report no falls at home and pt appears to be back to baseline. PT is signing-off. Please re-order if there is any significant change in status. Thank you for this referral.  Elayne Snare, PT [phone] [place], [place]   Ellouise Newer 07/25/2015, 1:37 PM

## 2015-07-26 DIAGNOSIS — I4891 Unspecified atrial fibrillation: Secondary | ICD-10-CM

## 2015-07-26 DIAGNOSIS — J069 Acute upper respiratory infection, unspecified: Secondary | ICD-10-CM

## 2015-07-26 DIAGNOSIS — I10 Essential (primary) hypertension: Secondary | ICD-10-CM

## 2015-07-26 DIAGNOSIS — I2510 Atherosclerotic heart disease of native coronary artery without angina pectoris: Secondary | ICD-10-CM

## 2015-07-26 LAB — GLUCOSE, CAPILLARY: Glucose-Capillary: 103 mg/dL — ABNORMAL HIGH (ref 65–99)

## 2015-07-26 LAB — URINE CULTURE

## 2015-07-26 MED ORDER — METOPROLOL TARTRATE 100 MG PO TABS
100.0000 mg | ORAL_TABLET | Freq: Two times a day (BID) | ORAL | Status: DC
  Administered 2015-07-26 – 2015-07-27 (×2): 100 mg via ORAL
  Filled 2015-07-26 (×2): qty 1

## 2015-07-26 MED ORDER — HYDRALAZINE HCL 20 MG/ML IJ SOLN
5.0000 mg | Freq: Once | INTRAMUSCULAR | Status: AC
  Administered 2015-07-26: 5 mg via INTRAVENOUS
  Filled 2015-07-26: qty 1

## 2015-07-26 MED ORDER — HYDRALAZINE HCL 20 MG/ML IJ SOLN
10.0000 mg | Freq: Four times a day (QID) | INTRAMUSCULAR | Status: DC | PRN
  Administered 2015-07-27: 10 mg via INTRAVENOUS
  Filled 2015-07-26: qty 1

## 2015-07-26 MED ORDER — LISINOPRIL 10 MG PO TABS
10.0000 mg | ORAL_TABLET | Freq: Every day | ORAL | Status: DC
  Administered 2015-07-27: 10 mg via ORAL
  Filled 2015-07-26: qty 1

## 2015-07-26 MED ORDER — METOPROLOL TARTRATE 50 MG PO TABS
50.0000 mg | ORAL_TABLET | Freq: Once | ORAL | Status: AC
  Administered 2015-07-26: 50 mg via ORAL
  Filled 2015-07-26: qty 1

## 2015-07-26 NOTE — Progress Notes (Signed)
Pt ambulated length of hallway twice with no assistive devices needed. Pt's HR did jump into the 120's peaking above 140 at times. Pt back in recliner in room resting. Will continue to monitor.

## 2015-07-26 NOTE — Progress Notes (Signed)
PATIENT DETAILS Name: Jim Dodson Age: 74 y.o. Sex: male Date of Birth: Oct 12, 1940 Admit Date: 07/25/2015 Admitting Physician Norval Morton, MD [id number] M, MD  Subjective: Much better-completely awake and alert  Assessment/Plan:   Sepsis at the time of admission after polypectomy: Etiology remains unknown-has some mild URI symotoms, but sepsis pathophysiology significantly improved. UA/chest x-ray negative. Continue empiric antibiotics, influenza PCR and blood cultures thus far negative, lactic acid and pro-calcitonin unremarkable, if stable discharge in the morning on oral Levaquin.  Acute encephalopathy: Resolved, likely secondary to sepsis of unknown etiology. CT head negative for acute intracranial abnormality. Continue cardiac antibiotics, await culture data.  A. fib RVR: Rate better controlled, doubled metoprolol.On Eliquis-Chadvasc score 3  Hypertension: Controlled, continue metoprolol higher than home dose, have cut back on lisinopril will monitor.  Anemia: Hemoglobin stable, is post recent colonoscopy on 12/16-polypectomies. Biopsy negative.  GERD (gastroesophageal reflux disease): Continue PPI    Disposition: Remain inpatient  Antimicrobial agents  See below  Anti-infectives    Start     Dose/Rate Route Frequency Ordered Stop   07/25/15 1600  vancomycin (VANCOCIN) IVPB 1000 mg/200 mL premix  Status:  Discontinued     1,000 mg 200 mL/hr over 60 Minutes Intravenous Every 12 hours 07/25/15 0459 07/25/15 0501   07/25/15 1600  vancomycin (VANCOCIN) 1,250 mg in sodium chloride 0.9 % 250 mL IVPB     1,250 mg 166.7 mL/hr over 90 Minutes Intravenous Every 12 hours 07/25/15 0501     07/25/15 0800  piperacillin-tazobactam (ZOSYN) IVPB 3.375 g     3.375 g 12.5 mL/hr over 240 Minutes Intravenous Every 8 hours 07/25/15 0458     07/25/15 0545  piperacillin-tazobactam (ZOSYN) IVPB 3.375 g  Status:  Discontinued     3.375 g 100 mL/hr over 30  Minutes Intravenous  Once 07/25/15 0539 07/25/15 0541   07/25/15 0545  vancomycin (VANCOCIN) IVPB 1000 mg/200 mL premix  Status:  Discontinued     1,000 mg 200 mL/hr over 60 Minutes Intravenous  Once 07/25/15 0539 07/25/15 0541   07/25/15 0300  piperacillin-tazobactam (ZOSYN) IVPB 3.375 g     3.375 g 100 mL/hr over 30 Minutes Intravenous  Once 07/25/15 0256 07/25/15 0333   07/25/15 0300  vancomycin (VANCOCIN) 1,500 mg in sodium chloride 0.9 % 500 mL IVPB     1,500 mg 250 mL/hr over 120 Minutes Intravenous  Once 07/25/15 0256 07/25/15 0511      DVT Prophylaxis: Eliquis  Code Status: Full code   Family Communication Spouse at bedside  Procedures: None  CONSULTS:  None  Time spent 30 minutes-Greater than 50% of this time was spent in counseling, explanation of diagnosis, planning of further management, and coordination of care.  MEDICATIONS: Scheduled Meds: . apixaban  5 mg Oral BID  . cholecalciferol  5,000 Units Oral Daily  . guaiFENesin  600 mg Oral BID  . [START ON 07/27/2015] lisinopril  10 mg Oral Daily  . metoprolol  100 mg Oral BID  . metoprolol tartrate  50 mg Oral Once  . pantoprazole  40 mg Oral Daily  . piperacillin-tazobactam (ZOSYN)  IV  3.375 g Intravenous Q8H  . sodium chloride  3 mL Intravenous Q12H  . vancomycin  1,250 mg Intravenous Q12H   Continuous Infusions:  PRN Meds:.albuterol, hydrALAZINE, Influenza vac split quadrivalent PF, [DISCONTINUED] ondansetron **OR** ondansetron (ZOFRAN) IV, pneumococcal 23 valent vaccine    PHYSICAL EXAM: Vital  signs in last 24 hours: Filed Vitals:   07/25/15 2141 07/26/15 0448 07/26/15 0605 07/26/15 1000  BP: 146/90 163/104 175/106 152/92  Pulse: 93 100  120  Temp: 98.8 F (37.1 C) 98.3 F (36.8 C)  98.7 F (37.1 C)  TempSrc: Oral Oral  Oral  Resp: 18 19  18   Height:      Weight:      SpO2: 97% 96%  97%    Weight change:  Filed Weights   07/25/15 0154 07/25/15 0542  Weight: 91.23 kg (201 lb 2 oz)  90.855 kg (200 lb 4.8 oz)   Body mass index is 32.34 kg/(m^2).   Gen Exam: Awake and alert with clear speech.   Neck: Supple, No JVD.   Chest: B/L Clear-few scattered rhonchi CVS: S1 S2 Regular, no murmurs.  Abdomen: soft, BS +, non tender, non distended.  Extremities: no edema, lower extremities warm to touch. Neurologic: Non Focal.   Skin: No Rash.   Wounds: N/A.  Intake/Output from previous day:  Intake/Output Summary (Last 24 hours) at 07/26/15 1059 Last data filed at 07/26/15 0900  Gross per 24 hour  Intake   1850 ml  Output   1175 ml  Net    675 ml     LAB RESULTS: CBC  Recent Labs Lab 07/25/15 0208  WBC 5.9  HGB 11.2*  HCT 35.3*  PLT 141*  MCV 84.0  MCH 26.7  MCHC 31.7  RDW 15.7*  LYMPHSABS 0.6*  MONOABS 0.5  EOSABS 0.0  BASOSABS 0.0    Chemistries   Recent Labs Lab 07/25/15 0208  NA 135  K 3.7  CL 101  CO2 24  GLUCOSE 120*  BUN 11  CREATININE 1.12  CALCIUM 8.5*    CBG:  Recent Labs Lab 07/25/15 0807  GLUCAP 115*    GFR Estimated Creatinine Clearance: 62 mL/min (by C-G formula based on Cr of 1.12).  Coagulation profile  Recent Labs Lab 07/25/15 0208  INR 1.26    Cardiac Enzymes  Recent Labs Lab 07/25/15 0710 07/25/15 0820 07/25/15 1110  TROPONINI 0.05* 0.04* 0.04*    Invalid input(s): POCBNP No results for input(s): DDIMER in the last 72 hours. No results for input(s): HGBA1C in the last 72 hours. No results for input(s): CHOL, HDL, LDLCALC, TRIG, CHOLHDL, LDLDIRECT in the last 72 hours. No results for input(s): TSH, T4TOTAL, T3FREE, THYROIDAB in the last 72 hours.  Invalid input(s): FREET3 No results for input(s): VITAMINB12, FOLATE, FERRITIN, TIBC, IRON, RETICCTPCT in the last 72 hours.  Recent Labs  07/25/15 0208  LIPASE 28    Urine Studies No results for input(s): UHGB, CRYS in the last 72 hours.  Invalid input(s): UACOL, UAPR, USPG, UPH, UTP, UGL, UKET, UBIL, UNIT, UROB, ULEU, UEPI, UWBC, URBC,  UBAC, CAST, UCOM, BILUA  MICROBIOLOGY: Recent Results (from the past 240 hour(s))  Culture, blood (Routine X 2) w Reflex to ID Panel     Status: None (Preliminary result)   Collection Time: 07/25/15  2:16 AM  Result Value Ref Range Status   Specimen Description BLOOD RIGHT ARM  Final   Special Requests BOTTLES DRAWN AEROBIC AND ANAEROBIC 10ML  Final   Culture NO GROWTH 1 DAY  Final   Report Status PENDING  Incomplete  Culture, blood (Routine X 2) w Reflex to ID Panel     Status: None (Preliminary result)   Collection Time: 07/25/15  2:20 AM  Result Value Ref Range Status   Specimen Description BLOOD LEFT ARM  Final   Special Requests BOTTLES DRAWN AEROBIC AND ANAEROBIC 10ML  Final   Culture NO GROWTH 1 DAY  Final   Report Status PENDING  Incomplete    RADIOLOGY STUDIES/RESULTS: Dg Chest 2 View  07/25/2015  CLINICAL DATA:  74 year old male with altered mental status, and cough. EXAM: CHEST  2 VIEW COMPARISON:  None. FINDINGS: Two view of the chest do not demonstrate any focal consolidation. There is no pleural effusion or pneumothorax. Mild cardiomegaly. The osseous structures are grossly unremarkable with IMPRESSION: No active cardiopulmonary disease. Electronically Signed   By: Anner Crete M.D.   On: 07/25/2015 03:13   Ct Head Wo Contrast  07/25/2015  CLINICAL DATA:  74 year old male with altered mental status EXAM: CT HEAD WITHOUT CONTRAST TECHNIQUE: Contiguous axial images were obtained from the base of the skull through the vertex without intravenous contrast. COMPARISON:  Head CT dated 05/28/2015 FINDINGS: The ventricles are dilated and the sulci are prominent compatible with age-related atrophy. Periventricular and deep white matter hypodensities represent chronic microvascular ischemic changes. There is no intracranial hemorrhage. No mass effect or midline shift identified. Mild mucoperiosteal thickening of paranasal sinuses. The mastoid air cells are well aerated. There is  stable focal right temporal craniectomy defect. No acute calvarial fracture. IMPRESSION: No acute intracranial hemorrhage. Age-related atrophy and chronic microvascular ischemic disease. If symptoms persist and there are no contraindications, MRI may provide better evaluation if clinically indicated. Electronically Signed   By: Anner Crete M.D.   On: 07/25/2015 02:59    Thurnell Lose, MD  [facility] Pager:[phone]  If 7PM-7AM, please contact night-coverage [email or web address] Password [email or web address] 07/26/2015, 10:59 AM   LOS: 1 day

## 2015-07-26 NOTE — Progress Notes (Signed)
Notified MD on call about patient's blood pressure;163/104 and 175/106 (manual). 5 mg of hydralazine given as ordered.

## 2015-07-27 LAB — CBC
HCT: 31.2 % — ABNORMAL LOW (ref 39.0–52.0)
Hemoglobin: 9.9 g/dL — ABNORMAL LOW (ref 13.0–17.0)
MCH: 26.5 pg (ref 26.0–34.0)
MCHC: 31.7 g/dL (ref 30.0–36.0)
MCV: 83.4 fL (ref 78.0–100.0)
Platelets: 128 10*3/uL — ABNORMAL LOW (ref 150–400)
RBC: 3.74 MIL/uL — ABNORMAL LOW (ref 4.22–5.81)
RDW: 15.6 % — ABNORMAL HIGH (ref 11.5–15.5)
WBC: 6 10*3/uL (ref 4.0–10.5)

## 2015-07-27 LAB — BASIC METABOLIC PANEL
Anion gap: 10 (ref 5–15)
BUN: 12 mg/dL (ref 6–20)
CO2: 24 mmol/L (ref 22–32)
Calcium: 8.5 mg/dL — ABNORMAL LOW (ref 8.9–10.3)
Chloride: 105 mmol/L (ref 101–111)
Creatinine, Ser: 0.95 mg/dL (ref 0.61–1.24)
GFR calc Af Amer: >60 mL/min (ref >60)
GFR calc non Af Amer: >60 mL/min (ref >60)
Glucose, Bld: 103 mg/dL — ABNORMAL HIGH (ref 65–99)
Potassium: 3.7 mmol/L (ref 3.5–5.1)
Sodium: 139 mmol/L (ref 135–145)

## 2015-07-27 LAB — GLUCOSE, CAPILLARY: Glucose-Capillary: 101 mg/dL — ABNORMAL HIGH (ref 65–99)

## 2015-07-27 MED ORDER — LEVOFLOXACIN 750 MG PO TABS
750.0000 mg | ORAL_TABLET | Freq: Every day | ORAL | Status: DC
  Administered 2015-07-27: 750 mg via ORAL
  Filled 2015-07-27: qty 1

## 2015-07-27 MED ORDER — ACETAMINOPHEN 500 MG PO TABS
500.0000 mg | ORAL_TABLET | Freq: Four times a day (QID) | ORAL | Status: DC | PRN
  Administered 2015-07-27: 500 mg via ORAL
  Filled 2015-07-27: qty 1

## 2015-07-27 MED ORDER — METOPROLOL TARTRATE 100 MG PO TABS: 100.0000 mg | ORAL_TABLET | Freq: Two times a day (BID) | ORAL | Status: DC

## 2015-07-27 MED ORDER — HYDRALAZINE HCL 50 MG PO TABS
50.0000 mg | ORAL_TABLET | Freq: Three times a day (TID) | ORAL | Status: DC
  Administered 2015-07-27: 50 mg via ORAL
  Filled 2015-07-27: qty 1

## 2015-07-27 MED ORDER — LEVOFLOXACIN 750 MG PO TABS: 750.0000 mg | ORAL_TABLET | Freq: Every day | ORAL | Status: DC

## 2015-07-27 MED ORDER — HYDRALAZINE HCL 50 MG PO TABS: 50.0000 mg | ORAL_TABLET | Freq: Three times a day (TID) | ORAL | Status: DC

## 2015-07-27 NOTE — Progress Notes (Signed)
ANTIBIOTIC CONSULT NOTE - INITIAL  Pharmacy Consult for levofloxacin Indication: upper respiratory infection  Allergies  Allergen Reactions  . Statins Swelling and Other (See Comments)    Legs swelled up and cramped up really bad    Patient Measurements: Height: 5\' 6"  (167.6 cm) Weight: 202 lb (91.627 kg) IBW/kg (Calculated) : 63.8   Vital Signs: Temp: 98.2 F (36.8 C) (12/24 0444) BP: 168/100 mmHg (12/24 0444) Pulse Rate: 82 (12/24 0444) Intake/Output from previous day: 12/23 0701 - 12/24 0700 In: 360 [P.O.:360] Out: 875 [Urine:875] Intake/Output from this shift:    Labs:  Recent Labs  07/25/15 0208 07/27/15 0456  WBC 5.9 6.0  HGB 11.2* 9.9*  PLT 141* 128*  CREATININE 1.12 0.95   Estimated Creatinine Clearance: 73.4 mL/min (by C-G formula based on Cr of 0.95). No results for input(s): VANCOTROUGH, VANCOPEAK, VANCORANDOM, GENTTROUGH, GENTPEAK, GENTRANDOM, TOBRATROUGH, TOBRAPEAK, TOBRARND, AMIKACINPEAK, AMIKACINTROU, AMIKACIN in the last 72 hours.   Microbiology: Recent Results (from the past 720 hour(s))  Culture, blood (Routine X 2) w Reflex to ID Panel     Status: None (Preliminary result)   Collection Time: 07/25/15  2:16 AM  Result Value Ref Range Status   Specimen Description BLOOD RIGHT ARM  Final   Special Requests BOTTLES DRAWN AEROBIC AND ANAEROBIC 10ML  Final   Culture NO GROWTH 1 DAY  Final   Report Status PENDING  Incomplete  Culture, blood (Routine X 2) w Reflex to ID Panel     Status: None (Preliminary result)   Collection Time: 07/25/15  2:20 AM  Result Value Ref Range Status   Specimen Description BLOOD LEFT ARM  Final   Special Requests BOTTLES DRAWN AEROBIC AND ANAEROBIC 10ML  Final   Culture NO GROWTH 1 DAY  Final   Report Status PENDING  Incomplete  Urine culture     Status: None   Collection Time: 07/25/15  2:54 AM  Result Value Ref Range Status   Specimen Description URINE, RANDOM  Final   Special Requests NONE  Final   Culture  MULTIPLE SPECIES PRESENT, SUGGEST RECOLLECTION  Final   Report Status 07/26/2015 FINAL  Final   Assessment: 59 YOM admitted with sepsis. UA and CXR negative, flu panel negative, lactic acid and calcitonin improved. No growth on blood cultures. Still with some upper respiratory symptoms. WBC 6, afebrile.  Vanc 12/22>>12/24 Zosyn 12/22>>12/24 LVQ 12/24>>  SCr 0.95, CrCL ~76mL/min.  Goal of Therapy:  Eradication of infection  Plan:  -Levofloxacin 750mg  PO daily x4 days to complete a 7 day course of total antibiotics -pharmacy to sign off as renal function has been stable.  Linden Mikes D. Shaya Reddick, PharmD, BCPS Clinical Pharmacist Pager: [phone] 07/27/2015 7:22 AM

## 2015-07-27 NOTE — Discharge Summary (Signed)
Jim Dodson, is a 74 y.o. male  DOB 08-24-1940  MRN [id number].  Admission date:  07/25/2015  Admitting Physician  Norval Morton, MD  Discharge Date:  07/27/2015   Primary MD  Precious Reel, MD  Recommendations for primary care physician for things to follow:   Follow final culture results in 3-4 days, check CBC BMP and blood pressure next visit.   Admission Diagnosis  Sepsis, due to unspecified organism [facility]) [A41.9]   Discharge Diagnosis  Sepsis, due to unspecified organism [facility]) [A41.9]    Principal Problem:   Sepsis ([place]) Active Problems:   Atrial fibrillation with rapid ventricular response (HCC)   Chronic coronary artery disease   On continuous oral anticoagulation   Essential hypertension   GERD (gastroesophageal reflux disease)   Acute upper respiratory infection      Past Medical History  Diagnosis Date  . Hypertension   . Shortness of breath dyspnea   . Prostate cancer ([place])   . Coronary artery disease     Past Surgical History  Procedure Laterality Date  . Laparotomy N/A 03/08/2015    Procedure: EXPLORATORY LAPAROTOMY;  Surgeon: Autumn Messing III, MD;  Location: [place];  Service: General;  Laterality: N/A;  . Ventral hernia repair N/A 03/08/2015    Procedure:  VENTRAL HERNIA  REPAIR ADULT;  Surgeon: Autumn Messing III, MD;  Location: [place];  Service: General;  Laterality: N/A;  . Lysis of adhesion N/A 03/08/2015    Procedure: LYSIS OF ADHESION;  Surgeon: Autumn Messing III, MD;  Location: [place];  Service: General;  Laterality: N/A;  . Omentectomy N/A 03/08/2015    Procedure: OMENTECTOMY;  Surgeon: Autumn Messing III, MD;  Location: [place];  Service: General;  Laterality: N/A;  . Umbilical hernia repair  03/08/2015    Procedure: UMBILICAL HERNIA REPAIR  ADULT;  Surgeon: Autumn Messing III, MD;  Location: [place];   Service: General;;  . Laparotomy N/A 03/09/2015    Procedure: EXPLORATORY LAPAROTOMY AND EVACUATION OF HEMATOMA;  Surgeon: Autumn Messing III, MD;  Location: [place];  Service: General;  Laterality: N/A;  . Tee without cardioversion N/A 03/13/2015    Procedure: TRANSESOPHAGEAL ECHOCARDIOGRAM (TEE);  Surgeon: Jerline Pain, MD;  Location: [place];  Service: Cardiovascular;  Laterality: N/A;  . Cardioversion N/A 03/13/2015    Procedure: CARDIOVERSION;  Surgeon: Jerline Pain, MD;  Location: [facility] ENDOSCOPY;  Service: Cardiovascular;  Laterality: N/A;  . Cryo surgery of prostate    . Colonoscopy with propofol N/A 07/19/2015    Procedure: COLONOSCOPY WITH PROPOFOL;  Surgeon: Manya Silvas, MD;  Location: [facility] ENDOSCOPY;  Service: Endoscopy;  Laterality: N/A;  . Esophagogastroduodenoscopy (egd) with propofol N/A 07/19/2015    Procedure: ESOPHAGOGASTRODUODENOSCOPY (EGD) WITH PROPOFOL;  Surgeon: Manya Silvas, MD;  Location: [facility] ENDOSCOPY;  Service: Endoscopy;  Laterality: N/A;       HPI  from the history and physical done on the day of admission:   Patient is a 74 year old male with a past medical history significant  for atrial fibrillation previously on Eliquis, HTN, CAD s/p stent, history of prostate cancer s/p cryotherapy last PSA less than 6 months ago 5; who presents with confusion. History is obtained by the patient's wife as patient is noted to be acutely altered and confused. She notes that he started initially complaining of tingling in his fingers and across his shoulders yesterday night. Then he started asking when he was going to go home although he was at home. She notes multiple other things which she associated with him being confused and not his normal self including stating the wrong year, trying to go to work at night when he works days, and asking whose house he was in. Patient denies any focal weakness, slurred speech, chest pain, shortness of breath, palpitations, or abdominal pain.     Wife notes that he had recent EGD and colonoscopy on 12/16. Following the procedure he was told that he had diverticulosis and 5 polyps were removed. He had been told to stop his Eliquis for the procedure, but not advised on when to restart this medication. 2 days following the procedure the patient was noted to start feeling under the weather now complaining of sinus congestion and cough. Cough was nonproductive. He tried over-the-counter cetirizine without relief of symptoms. His wife has also been under the weather is well with similar symptoms.   Upon arrival to the emergency department patient was found to be in A. Fib with heart rates in the 130s, and had a fever of 103.40F. Initial UA and chest x-ray appear within normal limits. CT of the head was negative for any acute stroke.      Hospital Course:     Sepsis at the time of admission after polypectomy: Etiology remains unknown-has some mild URI symotoms, but sepsis pathophysiology significantly improved. UA/chest x-ray negative. Continue empiric antibiotics, influenza PCR and blood cultures thus far negative, lactic acid and pro-calcitonin unremarkable, will be placed on Levaquin for 4 more days for possible URI and discharged home. Request PCP to check final blood culture results in the next 3-4 days and also recheck CBC BMP next visit.  Acute encephalopathy: Resolved, likely secondary to sepsis of unknown etiology. CT head negative for acute intracranial abnormality.   A. fib RVR: Rate better controlled on higher dose metoprolol.On Eliquis-Chadvasc score 3  Hypertension: Poorly Controlled, have increased Lopressor dose, continue home dose ACE inhibitor and added hydralazine for better control. Request PCP to continue monitoring blood pressure and adjust medications as needed.  Anemia: Hemoglobin stable, is post recent colonoscopy on 12/16-polypectomies. Biopsy negative.  GERD (gastroesophageal reflux disease): Continue PPI        Discharge Condition: Good  Follow UP  Follow-up Information    Follow up with Precious Reel, MD. Schedule an appointment as soon as possible for a visit in 3 days.   Specialty:  Internal Medicine   Why:  Follow final blood culture results   Contact information:   [place] [address] [phone]        Consults obtained - None  Diet and Activity recommendation: See Discharge Instructions below  Discharge Instructions       Discharge Instructions    Diet - low sodium heart healthy    Complete by:  As directed      Discharge instructions    Complete by:  As directed   Follow with Primary MD Precious Reel, MD in 3-4 days, follow final blood culture results   Get BP, CBC, CMP, 2 view  Chest X ray checked  by Primary MD next visit.    Activity: As tolerated with Full fall precautions use [place]/cane & assistance as needed   Disposition Home    Diet:   Heart Healthy .  For Heart failure patients - Check your Weight same time everyday, if you gain over 2 pounds, or you develop in leg swelling, experience more shortness of breath or chest pain, call your Primary MD immediately. Follow Cardiac Low Salt Diet and 1.5 lit/day fluid restriction.   On your next visit with your primary care physician please Get Medicines reviewed and adjusted.   Please request your Prim.MD to go over all Hospital Tests and Procedure/Radiological results at the follow up, please get all Hospital records sent to your Prim MD by signing hospital release before you go home.   If you experience worsening of your admission symptoms, develop shortness of breath, life threatening emergency, suicidal or homicidal thoughts you must seek medical attention immediately by calling 911 or calling your MD immediately  if symptoms less severe.  You Must read complete instructions/literature along with all the possible adverse reactions/side effects for all the Medicines you take and that have  been prescribed to you. Take any new Medicines after you have completely understood and accpet all the possible adverse reactions/side effects.   Do not drive, operating heavy machinery, perform activities at heights, swimming or participation in water activities or provide baby sitting services if your were admitted for syncope or siezures until you have seen by Primary MD or a Neurologist and advised to do so again.  Do not drive when taking Pain medications.    Do not take more than prescribed Pain, Sleep and Anxiety Medications  Special Instructions: If you have smoked or chewed Tobacco  in the last 2 yrs please stop smoking, stop any regular Alcohol  and or any Recreational drug use.  Wear Seat belts while driving.   Please note  You were cared for by a hospitalist during your hospital stay. If you have any questions about your discharge medications or the care you received while you were in the hospital after you are discharged, you can call the unit and asked to speak with the hospitalist on call if the hospitalist that took care of you is not available. Once you are discharged, your primary care physician will handle any further medical issues. Please note that NO REFILLS for any discharge medications will be authorized once you are discharged, as it is imperative that you return to your primary care physician (or establish a relationship with a primary care physician if you do not have one) for your aftercare needs so that they can reassess your need for medications and monitor your lab values.                                                       Jim Dodson was admitted to the Hospital on 07/25/2015 and Discharged  07/27/2015 and should be excused from work/school   For 4 days starting 07/25/2015 , may return to work/school without any restrictions.  Call Lala Lund MD, [facility]  [phone] with questions.  Thurnell Lose M.D on 07/27/2015,at 8:20  AM  [facility]   Office  [phone]     Increase activity slowly    Complete by:  As  directed              Discharge Medications       Medication List    TAKE these medications        acetaminophen 500 MG tablet  Commonly known as:  TYLENOL  Take 2 tablets (1,000 mg total) by mouth every 6 (six) hours as needed for moderate pain, fever or headache.     cholecalciferol 1000 UNITS tablet  Commonly known as:  VITAMIN D  Take 5,000 Units by mouth daily.     ELIQUIS 5 MG Tabs tablet  Generic drug:  apixaban  Take 5 mg by mouth 2 (two) times daily.     hydrALAZINE 50 MG tablet  Commonly known as:  APRESOLINE  Take 1 tablet (50 mg total) by mouth every 8 (eight) hours.     levofloxacin 750 MG tablet  Commonly known as:  LEVAQUIN  Take 1 tablet (750 mg total) by mouth daily.     lisinopril 20 MG tablet  Commonly known as:  PRINIVIL,ZESTRIL  Take 20 mg by mouth daily.     MAGNESIUM-POTASSIUM PO  Take 1 tablet by mouth daily at 12 noon.     meclizine 12.5 MG tablet  Commonly known as:  ANTIVERT  Take 12.5 mg by mouth 3 (three) times daily as needed for dizziness.     metoprolol 100 MG tablet  Commonly known as:  LOPRESSOR  Take 1 tablet (100 mg total) by mouth 2 (two) times daily.     NUTRITIONAL SUPPLEMENT PO  Take 6 tablets by mouth 2 (two) times daily. [place]: 40% ketosterones/ 20% 3-ketosterone     omeprazole 20 MG capsule  Commonly known as:  PRILOSEC  Take 20 mg by mouth daily.     Red Yeast Rice Extract 600 MG Caps  Take 1,200 mg by mouth 2 (two) times daily.        Major procedures and Radiology Reports - PLEASE review detailed and final reports for all details, in brief -     Dg Chest 2 View  07/25/2015  CLINICAL DATA:  74 year old male with altered mental status, and cough. EXAM: CHEST  2 VIEW COMPARISON:  None. FINDINGS: Two view of the chest do not demonstrate any focal consolidation. There is no pleural effusion or pneumothorax.  Mild cardiomegaly. The osseous structures are grossly unremarkable with IMPRESSION: No active cardiopulmonary disease. Electronically Signed   By: Anner Crete M.D.   On: 07/25/2015 03:13   Ct Head Wo Contrast  07/25/2015  CLINICAL DATA:  74 year old male with altered mental status EXAM: CT HEAD WITHOUT CONTRAST TECHNIQUE: Contiguous axial images were obtained from the base of the skull through the vertex without intravenous contrast. COMPARISON:  Head CT dated 05/28/2015 FINDINGS: The ventricles are dilated and the sulci are prominent compatible with age-related atrophy. Periventricular and deep white matter hypodensities represent chronic microvascular ischemic changes. There is no intracranial hemorrhage. No mass effect or midline shift identified. Mild mucoperiosteal thickening of paranasal sinuses. The mastoid air cells are well aerated. There is stable focal right temporal craniectomy defect. No acute calvarial fracture. IMPRESSION: No acute intracranial hemorrhage. Age-related atrophy and chronic microvascular ischemic disease. If symptoms persist and there are no contraindications, MRI may provide better evaluation if clinically indicated. Electronically Signed   By: Anner Crete M.D.   On: 07/25/2015 02:59    Micro Results      Recent Results (from the past 240 hour(s))  Culture, blood (Routine X 2) w Reflex  to ID Panel     Status: None (Preliminary result)   Collection Time: 07/25/15  2:16 AM  Result Value Ref Range Status   Specimen Description BLOOD RIGHT ARM  Final   Special Requests BOTTLES DRAWN AEROBIC AND ANAEROBIC 10ML  Final   Culture NO GROWTH 1 DAY  Final   Report Status PENDING  Incomplete  Culture, blood (Routine X 2) w Reflex to ID Panel     Status: None (Preliminary result)   Collection Time: 07/25/15  2:20 AM  Result Value Ref Range Status   Specimen Description BLOOD LEFT ARM  Final   Special Requests BOTTLES DRAWN AEROBIC AND ANAEROBIC 10ML  Final   Culture  NO GROWTH 1 DAY  Final   Report Status PENDING  Incomplete  Urine culture     Status: None   Collection Time: 07/25/15  2:54 AM  Result Value Ref Range Status   Specimen Description URINE, RANDOM  Final   Special Requests NONE  Final   Culture MULTIPLE SPECIES PRESENT, SUGGEST RECOLLECTION  Final   Report Status 07/26/2015 FINAL  Final    Today   Subjective    Jim Dodson today has no headache,no chest abdominal pain,no new weakness tingling or numbness, feels much better wants to go home today.      Objective   Blood pressure 150/90, pulse 88, temperature 98.7 F (37.1 C), temperature source Oral, resp. rate 18, height 5\' 6"  (1.676 m), weight 91.627 kg (202 lb), SpO2 98 %.   Intake/Output Summary (Last 24 hours) at 07/27/15 0932 Last data filed at 07/27/15 [id number]  Gross per 24 hour  Intake    240 ml  Output    525 ml  Net   -285 ml    Exam Awake Alert, Oriented x 3, No new F.N deficits, Normal affect [place].AT,PERRAL Supple Neck,No JVD, No cervical lymphadenopathy appriciated.  Symmetrical Chest wall movement, Good air movement bilaterally, CTAB RRR,No Gallops,Rubs or new Murmurs, No Parasternal Heave +ve B.Sounds, Abd Soft, Non tender, No organomegaly appriciated, No rebound -guarding or rigidity. No Cyanosis, Clubbing or edema, No new Rash or bruise   Data Review   CBC w Diff: Lab Results  Component Value Date   WBC 6.0 07/27/2015   HGB 9.9* 07/27/2015   HCT 31.2* 07/27/2015   PLT 128* 07/27/2015   LYMPHOPCT 9 07/25/2015   MONOPCT 8 07/25/2015   EOSPCT 0 07/25/2015   BASOPCT 0 07/25/2015    CMP: Lab Results  Component Value Date   NA 139 07/27/2015   K 3.7 07/27/2015   CL 105 07/27/2015   CO2 24 07/27/2015   BUN 12 07/27/2015   CREATININE 0.95 07/27/2015   PROT 6.4* 07/25/2015   ALBUMIN 3.3* 07/25/2015   BILITOT 0.6 07/25/2015   ALKPHOS 51 07/25/2015   AST 32 07/25/2015   ALT 25 07/25/2015  .   Total Time in preparing paper work, data evaluation  and todays exam - 35 minutes  Thurnell Lose M.D on 07/27/2015 at 9:32 AM  [facility]   Office  [phone]

## 2015-07-27 NOTE — Discharge Instructions (Signed)
Follow with Primary MD Precious Reel, MD in 3-4 days, follow final blood culture results   Get BP, CBC, CMP, 2 view Chest X ray checked  by Primary MD next visit.    Activity: As tolerated with Full fall precautions use [place]/cane & assistance as needed   Disposition Home    Diet:   Heart Healthy .  For Heart failure patients - Check your Weight same time everyday, if you gain over 2 pounds, or you develop in leg swelling, experience more shortness of breath or chest pain, call your Primary MD immediately. Follow Cardiac Low Salt Diet and 1.5 lit/day fluid restriction.   On your next visit with your primary care physician please Get Medicines reviewed and adjusted.   Please request your Prim.MD to go over all Hospital Tests and Procedure/Radiological results at the follow up, please get all Hospital records sent to your Prim MD by signing hospital release before you go home.   If you experience worsening of your admission symptoms, develop shortness of breath, life threatening emergency, suicidal or homicidal thoughts you must seek medical attention immediately by calling 911 or calling your MD immediately  if symptoms less severe.  You Must read complete instructions/literature along with all the possible adverse reactions/side effects for all the Medicines you take and that have been prescribed to you. Take any new Medicines after you have completely understood and accpet all the possible adverse reactions/side effects.   Do not drive, operating heavy machinery, perform activities at heights, swimming or participation in water activities or provide baby sitting services if your were admitted for syncope or siezures until you have seen by Primary MD or a Neurologist and advised to do so again.  Do not drive when taking Pain medications.    Do not take more than prescribed Pain, Sleep and Anxiety Medications  Special Instructions: If you have smoked or chewed Tobacco  in the last 2  yrs please stop smoking, stop any regular Alcohol  and or any Recreational drug use.  Wear Seat belts while driving.   Please note  You were cared for by a hospitalist during your hospital stay. If you have any questions about your discharge medications or the care you received while you were in the hospital after you are discharged, you can call the unit and asked to speak with the hospitalist on call if the hospitalist that took care of you is not available. Once you are discharged, your primary care physician will handle any further medical issues. Please note that NO REFILLS for any discharge medications will be authorized once you are discharged, as it is imperative that you return to your primary care physician (or establish a relationship with a primary care physician if you do not have one) for your aftercare needs so that they can reassess your need for medications and monitor your lab values.                                                       Jim Dodson was admitted to the Hospital on 07/25/2015 and Discharged  07/27/2015 and should be excused from work/school   For 4 days starting 07/25/2015 , may return to work/school without any restrictions.  Call Jim Lund MD, [facility]  [phone] with questions.  Jim Dodson M.D on 07/27/2015,at 8:20 AM  [facility]   Office  [phone]

## 2015-07-30 LAB — CULTURE, BLOOD (ROUTINE X 2)
Culture: NO GROWTH
Culture: NO GROWTH

## 2015-08-25 DIAGNOSIS — I2510 Atherosclerotic heart disease of native coronary artery without angina pectoris: Secondary | ICD-10-CM | POA: Diagnosis not present

## 2015-08-25 DIAGNOSIS — D649 Anemia, unspecified: Secondary | ICD-10-CM | POA: Diagnosis not present

## 2015-08-25 DIAGNOSIS — I480 Paroxysmal atrial fibrillation: Secondary | ICD-10-CM | POA: Diagnosis not present

## 2015-08-25 DIAGNOSIS — K219 Gastro-esophageal reflux disease without esophagitis: Secondary | ICD-10-CM | POA: Diagnosis not present

## 2015-08-25 DIAGNOSIS — I10 Essential (primary) hypertension: Secondary | ICD-10-CM | POA: Diagnosis not present

## 2015-08-25 DIAGNOSIS — K635 Polyp of colon: Secondary | ICD-10-CM | POA: Diagnosis not present

## 2015-08-25 DIAGNOSIS — A419 Sepsis, unspecified organism: Secondary | ICD-10-CM | POA: Diagnosis not present

## 2015-09-03 DIAGNOSIS — C61 Malignant neoplasm of prostate: Secondary | ICD-10-CM | POA: Diagnosis not present

## 2015-09-26 DIAGNOSIS — I480 Paroxysmal atrial fibrillation: Secondary | ICD-10-CM | POA: Diagnosis not present

## 2015-09-26 DIAGNOSIS — M859 Disorder of bone density and structure, unspecified: Secondary | ICD-10-CM | POA: Diagnosis not present

## 2015-09-26 DIAGNOSIS — R413 Other amnesia: Secondary | ICD-10-CM | POA: Diagnosis not present

## 2015-09-26 DIAGNOSIS — Z6835 Body mass index (BMI) 35.0-35.9, adult: Secondary | ICD-10-CM | POA: Diagnosis not present

## 2015-09-26 DIAGNOSIS — I10 Essential (primary) hypertension: Secondary | ICD-10-CM | POA: Diagnosis not present

## 2015-09-26 DIAGNOSIS — D6489 Other specified anemias: Secondary | ICD-10-CM | POA: Diagnosis not present

## 2015-09-26 DIAGNOSIS — C61 Malignant neoplasm of prostate: Secondary | ICD-10-CM | POA: Diagnosis not present

## 2015-09-26 DIAGNOSIS — E784 Other hyperlipidemia: Secondary | ICD-10-CM | POA: Diagnosis not present

## 2015-09-26 DIAGNOSIS — I110 Hypertensive heart disease with heart failure: Secondary | ICD-10-CM | POA: Diagnosis not present

## 2015-09-26 DIAGNOSIS — I429 Cardiomyopathy, unspecified: Secondary | ICD-10-CM | POA: Diagnosis not present

## 2015-10-23 DIAGNOSIS — E782 Mixed hyperlipidemia: Secondary | ICD-10-CM | POA: Diagnosis not present

## 2015-10-23 DIAGNOSIS — I4891 Unspecified atrial fibrillation: Secondary | ICD-10-CM | POA: Diagnosis not present

## 2015-10-23 DIAGNOSIS — I10 Essential (primary) hypertension: Secondary | ICD-10-CM | POA: Diagnosis not present

## 2015-10-23 DIAGNOSIS — I25118 Atherosclerotic heart disease of native coronary artery with other forms of angina pectoris: Secondary | ICD-10-CM | POA: Diagnosis not present

## 2015-11-21 DIAGNOSIS — Z6834 Body mass index (BMI) 34.0-34.9, adult: Secondary | ICD-10-CM | POA: Diagnosis not present

## 2015-11-21 DIAGNOSIS — I429 Cardiomyopathy, unspecified: Secondary | ICD-10-CM | POA: Diagnosis not present

## 2015-11-21 DIAGNOSIS — R071 Chest pain on breathing: Secondary | ICD-10-CM | POA: Diagnosis not present

## 2015-11-21 DIAGNOSIS — I480 Paroxysmal atrial fibrillation: Secondary | ICD-10-CM | POA: Diagnosis not present

## 2015-11-21 DIAGNOSIS — I252 Old myocardial infarction: Secondary | ICD-10-CM | POA: Diagnosis not present

## 2015-11-21 DIAGNOSIS — I2510 Atherosclerotic heart disease of native coronary artery without angina pectoris: Secondary | ICD-10-CM | POA: Diagnosis not present

## 2015-11-21 DIAGNOSIS — I10 Essential (primary) hypertension: Secondary | ICD-10-CM | POA: Diagnosis not present

## 2015-11-22 DIAGNOSIS — Z0000 Encounter for general adult medical examination without abnormal findings: Secondary | ICD-10-CM | POA: Diagnosis not present

## 2015-11-22 DIAGNOSIS — M859 Disorder of bone density and structure, unspecified: Secondary | ICD-10-CM | POA: Diagnosis not present

## 2015-11-22 DIAGNOSIS — I10 Essential (primary) hypertension: Secondary | ICD-10-CM | POA: Diagnosis not present

## 2015-11-22 DIAGNOSIS — Z125 Encounter for screening for malignant neoplasm of prostate: Secondary | ICD-10-CM | POA: Diagnosis not present

## 2015-11-22 DIAGNOSIS — E784 Other hyperlipidemia: Secondary | ICD-10-CM | POA: Diagnosis not present

## 2015-11-22 DIAGNOSIS — R739 Hyperglycemia, unspecified: Secondary | ICD-10-CM | POA: Diagnosis not present

## 2015-11-29 DIAGNOSIS — I252 Old myocardial infarction: Secondary | ICD-10-CM | POA: Diagnosis not present

## 2015-11-29 DIAGNOSIS — I429 Cardiomyopathy, unspecified: Secondary | ICD-10-CM | POA: Diagnosis not present

## 2015-11-29 DIAGNOSIS — R159 Full incontinence of feces: Secondary | ICD-10-CM | POA: Diagnosis not present

## 2015-11-29 DIAGNOSIS — C61 Malignant neoplasm of prostate: Secondary | ICD-10-CM | POA: Diagnosis not present

## 2015-11-29 DIAGNOSIS — R413 Other amnesia: Secondary | ICD-10-CM | POA: Diagnosis not present

## 2015-11-29 DIAGNOSIS — I480 Paroxysmal atrial fibrillation: Secondary | ICD-10-CM | POA: Diagnosis not present

## 2015-11-29 DIAGNOSIS — Z0000 Encounter for general adult medical examination without abnormal findings: Secondary | ICD-10-CM | POA: Diagnosis not present

## 2015-11-29 DIAGNOSIS — I77810 Thoracic aortic ectasia: Secondary | ICD-10-CM | POA: Diagnosis not present

## 2015-11-29 DIAGNOSIS — I110 Hypertensive heart disease with heart failure: Secondary | ICD-10-CM | POA: Diagnosis not present

## 2015-11-29 DIAGNOSIS — Z1389 Encounter for screening for other disorder: Secondary | ICD-10-CM | POA: Diagnosis not present

## 2015-11-29 DIAGNOSIS — D6489 Other specified anemias: Secondary | ICD-10-CM | POA: Diagnosis not present

## 2015-12-11 DIAGNOSIS — Z1212 Encounter for screening for malignant neoplasm of rectum: Secondary | ICD-10-CM | POA: Diagnosis not present

## 2016-02-25 DIAGNOSIS — I480 Paroxysmal atrial fibrillation: Secondary | ICD-10-CM | POA: Diagnosis not present

## 2016-02-25 DIAGNOSIS — I8390 Asymptomatic varicose veins of unspecified lower extremity: Secondary | ICD-10-CM | POA: Diagnosis not present

## 2016-02-25 DIAGNOSIS — I10 Essential (primary) hypertension: Secondary | ICD-10-CM | POA: Diagnosis not present

## 2016-02-25 DIAGNOSIS — M25562 Pain in left knee: Secondary | ICD-10-CM | POA: Diagnosis not present

## 2016-02-25 DIAGNOSIS — R600 Localized edema: Secondary | ICD-10-CM | POA: Diagnosis not present

## 2016-02-25 DIAGNOSIS — I110 Hypertensive heart disease with heart failure: Secondary | ICD-10-CM | POA: Diagnosis not present

## 2016-02-25 DIAGNOSIS — M1990 Unspecified osteoarthritis, unspecified site: Secondary | ICD-10-CM | POA: Diagnosis not present

## 2016-02-28 DIAGNOSIS — N200 Calculus of kidney: Secondary | ICD-10-CM | POA: Diagnosis not present

## 2016-02-28 DIAGNOSIS — R9389 Abnormal findings on diagnostic imaging of other specified body structures: Secondary | ICD-10-CM | POA: Insufficient documentation

## 2016-02-28 DIAGNOSIS — R339 Retention of urine, unspecified: Secondary | ICD-10-CM | POA: Diagnosis not present

## 2016-02-28 DIAGNOSIS — N320 Bladder-neck obstruction: Secondary | ICD-10-CM | POA: Diagnosis not present

## 2016-02-28 DIAGNOSIS — M4804 Spinal stenosis, thoracic region: Secondary | ICD-10-CM | POA: Diagnosis not present

## 2016-02-28 DIAGNOSIS — R938 Abnormal findings on diagnostic imaging of other specified body structures: Secondary | ICD-10-CM | POA: Diagnosis not present

## 2016-02-28 DIAGNOSIS — C61 Malignant neoplasm of prostate: Secondary | ICD-10-CM | POA: Diagnosis not present

## 2016-03-05 DIAGNOSIS — M25511 Pain in right shoulder: Secondary | ICD-10-CM | POA: Diagnosis not present

## 2016-03-05 DIAGNOSIS — M1712 Unilateral primary osteoarthritis, left knee: Secondary | ICD-10-CM | POA: Diagnosis not present

## 2016-03-24 DIAGNOSIS — I25118 Atherosclerotic heart disease of native coronary artery with other forms of angina pectoris: Secondary | ICD-10-CM | POA: Diagnosis not present

## 2016-03-24 DIAGNOSIS — I10 Essential (primary) hypertension: Secondary | ICD-10-CM | POA: Diagnosis not present

## 2016-03-24 DIAGNOSIS — I4891 Unspecified atrial fibrillation: Secondary | ICD-10-CM | POA: Diagnosis not present

## 2016-03-24 DIAGNOSIS — E782 Mixed hyperlipidemia: Secondary | ICD-10-CM | POA: Diagnosis not present

## 2016-03-26 ENCOUNTER — Other Ambulatory Visit: Payer: Self-pay | Admitting: Orthopaedic Surgery

## 2016-03-30 NOTE — Pre-Procedure Instructions (Signed)
Jim Dodson  03/30/2016      [facility] - [place], [place] - [address] [place] [address] Phone: [phone] Fax: [phone]    Your procedure is scheduled on Wednesday September, 6.  Report to [facility] Admitting at 6:30 A.M.  Call this number if you have problems the morning of surgery:  [phone]   Remember:  Do not eat food or drink liquids after midnight.  Take these medicines the morning of surgery with A SIP OF WATER: acetaminophen (tylenol) if needed, hydralazine, metoprolol (lopressor), omeprazole (prilosec)  7 days prior to surgery STOP taking any Aspirin, Aleve, Naproxen, Ibuprofen, Motrin, Advil, Goody's, BC's, all herbal medications, fish oil, and all vitamins    Do not wear jewelry.  Do not wear lotions, powders, or colognes, or deoderant.  Men may shave face and neck.  Do not bring valuables to the hospital.  [facility] is not responsible for any belongings or valuables.  Contacts, dentures or bridgework may not be worn into surgery.  Leave your suitcase in the car.  After surgery it may be brought to your room.  For patients admitted to the hospital, discharge time will be determined by your treatment team.  Patients discharged the day of surgery will not be allowed to drive home.    Special instructions:    [facility]- Preparing For Surgery  Before surgery, you can play an important role. Because skin is not sterile, your skin needs to be as free of germs as possible. You can reduce the number of germs on your skin by washing with CHG (chlorahexidine gluconate) Soap before surgery.  CHG is an antiseptic cleaner which kills germs and bonds with the skin to continue killing germs even after washing.  Please do not use if you have an allergy to CHG or antibacterial soaps. If your skin becomes reddened/irritated stop using the CHG.  Do not shave (including legs and underarms) for at least 48 hours  prior to first CHG shower. It is OK to shave your face.  Please follow these instructions carefully.   1. Shower the NIGHT BEFORE SURGERY and the MORNING OF SURGERY with CHG.   2. If you chose to wash your hair, wash your hair first as usual with your normal shampoo.  3. After you shampoo, rinse your hair and body thoroughly to remove the shampoo.  4. Use CHG as you would any other liquid soap. You can apply CHG directly to the skin and wash gently with a scrungie or a clean washcloth.   5. Apply the CHG Soap to your body ONLY FROM THE NECK DOWN.  Do not use on open wounds or open sores. Avoid contact with your eyes, ears, mouth and genitals (private parts). Wash genitals (private parts) with your normal soap.  6. Wash thoroughly, paying special attention to the area where your surgery will be performed.  7. Thoroughly rinse your body with warm water from the neck down.  8. DO NOT shower/wash with your normal soap after using and rinsing off the CHG Soap.  9. Pat yourself dry with a CLEAN TOWEL.   10. Wear CLEAN PAJAMAS   11. Place CLEAN SHEETS on your bed the night of your first shower and DO NOT SLEEP WITH PETS.    Day of Surgery: Do not apply any deodorants/lotions. Please wear clean clothes to the hospital/surgery center.      Please read over the following fact  sheets that you were given. MRSA Information

## 2016-03-31 ENCOUNTER — Encounter (HOSPITAL_COMMUNITY)
Admission: RE | Admit: 2016-03-31 | Discharge: 2016-03-31 | Disposition: A | Discharge status: Home / Self Care | Payer: PPO | Source: Ambulatory Visit | Attending: Orthopaedic Surgery | Admitting: Orthopaedic Surgery

## 2016-03-31 ENCOUNTER — Encounter (HOSPITAL_COMMUNITY): Payer: Self-pay

## 2016-03-31 DIAGNOSIS — M1712 Unilateral primary osteoarthritis, left knee: Secondary | ICD-10-CM | POA: Diagnosis not present

## 2016-03-31 DIAGNOSIS — Z01812 Encounter for preprocedural laboratory examination: Secondary | ICD-10-CM | POA: Diagnosis not present

## 2016-03-31 HISTORY — DX: Headache, unspecified: R51.9

## 2016-03-31 HISTORY — DX: Headache: R51

## 2016-03-31 HISTORY — DX: Personal history of other diseases of the digestive system: Z87.19

## 2016-03-31 HISTORY — DX: Unspecified atrial fibrillation: I48.91

## 2016-03-31 LAB — URINALYSIS, ROUTINE W REFLEX MICROSCOPIC
Bilirubin Urine: NEGATIVE
Glucose, UA: NEGATIVE mg/dL
Hgb urine dipstick: NEGATIVE
Ketones, ur: NEGATIVE mg/dL
Leukocytes, UA: NEGATIVE
Nitrite: NEGATIVE
Protein, ur: NEGATIVE mg/dL
Specific Gravity, Urine: 1.018 (ref 1.005–1.030)
pH: 6 (ref 5.0–8.0)

## 2016-03-31 LAB — COMPREHENSIVE METABOLIC PANEL
ALT: 30 U/L (ref 17–63)
AST: 25 U/L (ref 15–41)
Albumin: 3.8 g/dL (ref 3.5–5.0)
Alkaline Phosphatase: 51 U/L (ref 38–126)
Anion gap: 8 (ref 5–15)
BUN: 15 mg/dL (ref 6–20)
CO2: 30 mmol/L (ref 22–32)
Calcium: 9.3 mg/dL (ref 8.9–10.3)
Chloride: 99 mmol/L — ABNORMAL LOW (ref 101–111)
Creatinine, Ser: 1.06 mg/dL (ref 0.61–1.24)
GFR calc Af Amer: >60 mL/min (ref >60)
GFR calc non Af Amer: >60 mL/min (ref >60)
Glucose, Bld: 94 mg/dL (ref 65–99)
Potassium: 3.4 mmol/L — ABNORMAL LOW (ref 3.5–5.1)
Sodium: 137 mmol/L (ref 135–145)
Total Bilirubin: 0.8 mg/dL (ref 0.3–1.2)
Total Protein: 6.4 g/dL — ABNORMAL LOW (ref 6.5–8.1)

## 2016-03-31 LAB — CBC WITH DIFFERENTIAL/PLATELET
Basophils Absolute: 0 10*3/uL (ref 0.0–0.1)
Basophils Relative: 0 %
Eosinophils Absolute: 0.2 10*3/uL (ref 0.0–0.7)
Eosinophils Relative: 2 %
HCT: 38.9 % — ABNORMAL LOW (ref 39.0–52.0)
Hemoglobin: 12.1 g/dL — ABNORMAL LOW (ref 13.0–17.0)
Lymphocytes Relative: 20 %
Lymphs Abs: 1.5 10*3/uL (ref 0.7–4.0)
MCH: 26.5 pg (ref 26.0–34.0)
MCHC: 31.1 g/dL (ref 30.0–36.0)
MCV: 85.3 fL (ref 78.0–100.0)
Monocytes Absolute: 0.6 10*3/uL (ref 0.1–1.0)
Monocytes Relative: 9 %
Neutro Abs: 5 10*3/uL (ref 1.7–7.7)
Neutrophils Relative %: 69 %
Platelets: 207 10*3/uL (ref 150–400)
RBC: 4.56 MIL/uL (ref 4.22–5.81)
RDW: 16 % — ABNORMAL HIGH (ref 11.5–15.5)
WBC: 7.3 10*3/uL (ref 4.0–10.5)

## 2016-03-31 LAB — C-REACTIVE PROTEIN: CRP: <0.5 mg/dL (ref <1.0)

## 2016-03-31 LAB — SURGICAL PCR SCREEN
MRSA, PCR: NEGATIVE
Staphylococcus aureus: NEGATIVE

## 2016-03-31 LAB — SEDIMENTATION RATE: Sed Rate: 10 mm/hr (ref 0–16)

## 2016-03-31 NOTE — Progress Notes (Signed)
PCP: Shon Baton Cardiologist: Dr. Ubaldo Glassing  Echo: 03/13/15 Stress: 06/26/15 Cath: 2009? Pt has stent placed, Dr. Ubaldo Glassing  CXR: 07/25/15 EKG 10/23/15, requested from [facility]  Pt with no complaints of sob, chest pain at this time. Wife states that pt was given instructions by Dr. Ubaldo Glassing to stop taking Eliquis 3 days prior to surgery.

## 2016-04-01 NOTE — Progress Notes (Signed)
Anesthesia Chart Review: Patient is a 75 year old male scheduled for left TKA on 04/08/16 by Dr. Erlinda Hong.  History includes never smoker, CAD/subendocardial MI s/p DES LAD '10, CHF, HTN, afib/flutter s/p cardioversion 03/13/15 with recurrent afib 07/2015, hiatal hernia, SOB, prostate cancer s/p cryosurgery, ventral and umbilical hernia repairs (for incarcerated ventral hernia) 03/08/15 s/p hematoma evacuation 03/09/15. Admission 07/25/15-07/27/15 for sepsis of unknown source following polypectomy. Also had afib with RVR, improved rate control with b-blocker adjustment and treatment of his sepsis.  - PCP is Dr. Shon Baton. - Cardiologist is Dr. Bartholome Bill [facility], [place]; Care Everywhere), last visit 03/24/16. He felt patient would be low CV risk from a cardiac standpoint for orthopedic surgery. He gave permission to hold Eliquis 3 days prior to surgery. (Of note, Dr. Bethanne Ginger 07/09/15, 10/23/15, and 03/24/16 office notes state that patient has "paroxysmally atrial fibrillation but is currently in normal sinus rhythm"; however EKGs were done at two of those visits--07/09/15 and 10/23/15--and were interpreted by Dr. Ubaldo Glassing as  "atrial fibrillation." He was also in afib by 07/25/15 EKG at [facility]. - Urologist is Dr. Gates Rigg with [facility].  Meds include Eliquis, hydralazine, HCTZ, lisinopril, magnesium-potassium, meclizine, Lopressor, Prilosec, red yeast rice.  BP 103/71   Pulse (!) 58   Temp 36.6 C   Resp 20   Ht 5\' 5"  (1.651 m)   Wt 210 lb 4.8 oz (95.4 kg)   SpO2 97%   BMI 35.00 kg/m   10/23/15 EKG [facility]): Afib at 72 bpm, anteroseptal infarct (old). It does not appear that Dr. Ubaldo Glassing ordered a new EKG at patient's 03/24/16 visit.  06/26/15 Nuclear stress test ([facility]; Care Everywhere): Equivocal ETT based on baseline electrocardiographic changes.Sestamibi images revealed moderate reduced LV function EF 37% with no reversible ischemia.  03/13/15 TEE: Study Conclusions - Left ventricle: The cavity  size was normal. The estimated   ejection fraction was = 55%. No evidence of thrombus. - Mitral valve: There was mild regurgitation. - Left atrium: No evidence of thrombus in the atrial cavity or   appendage.  03/12/15 TTE: Study Conclusions - Left ventricle: The cavity size was normal. Systolic function was   normal. The estimated ejection fraction was in the range of 60%   to 65%. Images were inadequate for LV wall motion assessment. The   study was not technically sufficient to allow evaluation of LV   diastolic dysfunction due to atrial fibrillation. - Aortic valve: Trileaflet; normal thickness, mildly calcified   leaflets. There was mild regurgitation. - Mitral valve: There was trivial regurgitation.  07/05/09 Cardiac cath [facility]): Normal LM. 75% mid LAD, 60% distal LAD. Mid CX was a small vessel with diffuse 85% stenosis. Normal size RCA with minor luminal irregularities. S/P placement of a Xience 3.0 x 28 mm drug-eluting stent in the LAD.   07/25/15 CXR: IMPRESSION: No active cardiopulmonary disease.  Preoperative labs noted. Na 137. Cr 1.06. H/H 12.1/38.9. Glucose 94. UA WNL. He is for PT/INR on arrival.   Patient has cardiac clearance. In review of multiple EKG tracings since 07/2015, he has been in afib. Unclear if it is persistent or PAF. He in on b-blocker therapy and Eliquis. HR was 58 at PAT. Eliquis to be held three days preoperatively. I am not inclined to repeat his EKG pre-operatively unless tachycardic on arrival. Anesthesiologist Dr. Kalman Shan agrees with this plan.  George [facility] Short Stay Center/Anesthesiology Phone [phone] 04/01/2016 4:26 PM

## 2016-04-07 ENCOUNTER — Encounter (HOSPITAL_COMMUNITY): Payer: Self-pay | Admitting: Certified Registered"

## 2016-04-07 MED ORDER — TRANEXAMIC ACID 1000 MG/10ML IV SOLN
1000.0000 mg | INTRAVENOUS | Status: AC
  Administered 2016-04-08: 1000 mg via INTRAVENOUS
  Filled 2016-04-07: qty 10

## 2016-04-07 MED ORDER — CEFAZOLIN SODIUM-DEXTROSE 2-4 GM/100ML-% IV SOLN
2.0000 g | INTRAVENOUS | Status: AC
  Administered 2016-04-08: 2 g via INTRAVENOUS
  Filled 2016-04-07: qty 100

## 2016-04-07 MED ORDER — BUPIVACAINE LIPOSOME 1.3 % IJ SUSP
20.0000 mL | Freq: Once | INTRAMUSCULAR | Status: AC
  Administered 2016-04-08: 20 mL
  Filled 2016-04-07: qty 20

## 2016-04-08 ENCOUNTER — Inpatient Hospital Stay (HOSPITAL_COMMUNITY)
Admission: RE | Admit: 2016-04-08 | Discharge: 2016-04-10 | DRG: 470 | Disposition: A | Discharge status: Home Health | Payer: PPO | Source: Ambulatory Visit | Attending: Orthopaedic Surgery | Admitting: Orthopaedic Surgery

## 2016-04-08 ENCOUNTER — Inpatient Hospital Stay (HOSPITAL_COMMUNITY): Payer: PPO | Admitting: Certified Registered"

## 2016-04-08 ENCOUNTER — Inpatient Hospital Stay (HOSPITAL_COMMUNITY): Payer: PPO

## 2016-04-08 ENCOUNTER — Inpatient Hospital Stay (HOSPITAL_COMMUNITY): Payer: PPO | Admitting: Vascular Surgery

## 2016-04-08 ENCOUNTER — Encounter (HOSPITAL_COMMUNITY): Payer: Self-pay | Admitting: *Deleted

## 2016-04-08 ENCOUNTER — Encounter (HOSPITAL_COMMUNITY)
Admission: RE | Disposition: A | Discharge status: Home Health | Payer: Self-pay | Source: Ambulatory Visit | Attending: Orthopaedic Surgery

## 2016-04-08 DIAGNOSIS — I2510 Atherosclerotic heart disease of native coronary artery without angina pectoris: Secondary | ICD-10-CM | POA: Diagnosis not present

## 2016-04-08 DIAGNOSIS — I10 Essential (primary) hypertension: Secondary | ICD-10-CM | POA: Diagnosis not present

## 2016-04-08 DIAGNOSIS — K219 Gastro-esophageal reflux disease without esophagitis: Secondary | ICD-10-CM | POA: Diagnosis present

## 2016-04-08 DIAGNOSIS — Z96652 Presence of left artificial knee joint: Secondary | ICD-10-CM | POA: Diagnosis not present

## 2016-04-08 DIAGNOSIS — Z471 Aftercare following joint replacement surgery: Secondary | ICD-10-CM | POA: Diagnosis not present

## 2016-04-08 DIAGNOSIS — M179 Osteoarthritis of knee, unspecified: Secondary | ICD-10-CM | POA: Diagnosis not present

## 2016-04-08 DIAGNOSIS — M1712 Unilateral primary osteoarthritis, left knee: Secondary | ICD-10-CM | POA: Diagnosis not present

## 2016-04-08 DIAGNOSIS — Z955 Presence of coronary angioplasty implant and graft: Secondary | ICD-10-CM

## 2016-04-08 DIAGNOSIS — G8918 Other acute postprocedural pain: Secondary | ICD-10-CM | POA: Diagnosis not present

## 2016-04-08 DIAGNOSIS — I4891 Unspecified atrial fibrillation: Secondary | ICD-10-CM | POA: Diagnosis not present

## 2016-04-08 DIAGNOSIS — D62 Acute posthemorrhagic anemia: Secondary | ICD-10-CM | POA: Diagnosis not present

## 2016-04-08 DIAGNOSIS — Z8546 Personal history of malignant neoplasm of prostate: Secondary | ICD-10-CM

## 2016-04-08 DIAGNOSIS — Z96659 Presence of unspecified artificial knee joint: Secondary | ICD-10-CM

## 2016-04-08 HISTORY — PX: TOTAL KNEE ARTHROPLASTY: SHX125

## 2016-04-08 LAB — PROTIME-INR
INR: 1.13
Prothrombin Time: 14.6 seconds (ref 11.4–15.2)

## 2016-04-08 SURGERY — ARTHROPLASTY, KNEE, TOTAL
Anesthesia: Regional | Site: Knee | Laterality: Left

## 2016-04-08 MED ORDER — PROMETHAZINE HCL 25 MG/ML IJ SOLN: 6.2500 mg | INTRAMUSCULAR | Status: DC | PRN

## 2016-04-08 MED ORDER — METHOCARBAMOL 1000 MG/10ML IJ SOLN
500.0000 mg | Freq: Four times a day (QID) | INTRAMUSCULAR | Status: DC | PRN
  Filled 2016-04-08: qty 5

## 2016-04-08 MED ORDER — DIPHENHYDRAMINE HCL 12.5 MG/5ML PO ELIX: 25.0000 mg | ORAL_SOLUTION | ORAL | Status: DC | PRN

## 2016-04-08 MED ORDER — FENTANYL CITRATE (PF) 100 MCG/2ML IJ SOLN
INTRAMUSCULAR | Status: DC | PRN
  Administered 2016-04-08: 50 ug via INTRAVENOUS

## 2016-04-08 MED ORDER — BUPIVACAINE IN DEXTROSE 0.75-8.25 % IT SOLN
INTRATHECAL | Status: DC | PRN
  Administered 2016-04-08: 1.6 mL via INTRATHECAL

## 2016-04-08 MED ORDER — TRANEXAMIC ACID 1000 MG/10ML IV SOLN: INTRAVENOUS | Status: DC

## 2016-04-08 MED ORDER — VITAMIN D3 125 MCG (5000 UT) PO TABS: 1.0000 | ORAL_TABLET | Freq: Every day | ORAL | Status: DC

## 2016-04-08 MED ORDER — MIDAZOLAM HCL 5 MG/5ML IJ SOLN
INTRAMUSCULAR | Status: DC | PRN
  Administered 2016-04-08: 1 mg via INTRAVENOUS

## 2016-04-08 MED ORDER — ACETAMINOPHEN 500 MG PO TABS: 1000.0000 mg | ORAL_TABLET | Freq: Two times a day (BID) | ORAL | Status: DC

## 2016-04-08 MED ORDER — OXYCODONE HCL 5 MG PO TABS: 5.0000 mg | ORAL_TABLET | ORAL | 0 refills | Status: DC | PRN

## 2016-04-08 MED ORDER — POLYETHYLENE GLYCOL 3350 17 G PO PACK: 17.0000 g | PACK | Freq: Every day | ORAL | Status: DC | PRN

## 2016-04-08 MED ORDER — DEXAMETHASONE SODIUM PHOSPHATE 10 MG/ML IJ SOLN
10.0000 mg | Freq: Once | INTRAMUSCULAR | Status: AC
  Administered 2016-04-09: 10 mg via INTRAVENOUS
  Filled 2016-04-08: qty 1

## 2016-04-08 MED ORDER — OXYCODONE HCL 5 MG PO TABS
ORAL_TABLET | ORAL | Status: AC
  Filled 2016-04-08: qty 1

## 2016-04-08 MED ORDER — LISINOPRIL 20 MG PO TABS
20.0000 mg | ORAL_TABLET | Freq: Every day | ORAL | Status: DC
  Administered 2016-04-08 – 2016-04-10 (×3): 20 mg via ORAL
  Filled 2016-04-08 (×3): qty 1

## 2016-04-08 MED ORDER — ALUM & MAG HYDROXIDE-SIMETH 200-200-20 MG/5ML PO SUSP: 30.0000 mL | ORAL | Status: DC | PRN

## 2016-04-08 MED ORDER — SODIUM CHLORIDE 0.9 % IJ SOLN
INTRAMUSCULAR | Status: DC | PRN
  Administered 2016-04-08: 40 mL via INTRAVENOUS

## 2016-04-08 MED ORDER — HYDRALAZINE HCL 50 MG PO TABS
50.0000 mg | ORAL_TABLET | Freq: Three times a day (TID) | ORAL | Status: DC
  Administered 2016-04-08 – 2016-04-10 (×6): 50 mg via ORAL
  Filled 2016-04-08 (×6): qty 1

## 2016-04-08 MED ORDER — METHOCARBAMOL 500 MG PO TABS
500.0000 mg | ORAL_TABLET | Freq: Four times a day (QID) | ORAL | Status: DC | PRN
  Administered 2016-04-09 – 2016-04-10 (×4): 500 mg via ORAL
  Filled 2016-04-08 (×4): qty 1

## 2016-04-08 MED ORDER — KETOROLAC TROMETHAMINE 30 MG/ML IJ SOLN
INTRAMUSCULAR | Status: AC
  Filled 2016-04-08: qty 1

## 2016-04-08 MED ORDER — MORPHINE SULFATE (PF) 2 MG/ML IV SOLN
1.0000 mg | INTRAVENOUS | Status: DC | PRN
Start: 2016-04-08 — End: 2016-04-10

## 2016-04-08 MED ORDER — FENTANYL CITRATE (PF) 100 MCG/2ML IJ SOLN
INTRAMUSCULAR | Status: AC
  Filled 2016-04-08: qty 2

## 2016-04-08 MED ORDER — METOPROLOL TARTRATE 100 MG PO TABS
100.0000 mg | ORAL_TABLET | Freq: Two times a day (BID) | ORAL | Status: DC
  Administered 2016-04-08 – 2016-04-10 (×4): 100 mg via ORAL
  Filled 2016-04-08 (×4): qty 1

## 2016-04-08 MED ORDER — PHENYLEPHRINE 40 MCG/ML (10ML) SYRINGE FOR IV PUSH (FOR BLOOD PRESSURE SUPPORT)
PREFILLED_SYRINGE | INTRAVENOUS | Status: DC | PRN
  Administered 2016-04-08: 80 ug via INTRAVENOUS
  Administered 2016-04-08 (×2): 40 ug via INTRAVENOUS
  Administered 2016-04-08: 80 ug via INTRAVENOUS
  Administered 2016-04-08: 40 ug via INTRAVENOUS

## 2016-04-08 MED ORDER — MECLIZINE HCL 12.5 MG PO TABS
12.5000 mg | ORAL_TABLET | Freq: Three times a day (TID) | ORAL | Status: DC | PRN
  Filled 2016-04-08 (×2): qty 1

## 2016-04-08 MED ORDER — CHLORHEXIDINE GLUCONATE 4 % EX LIQD: 60.0000 mL | Freq: Once | CUTANEOUS | Status: DC

## 2016-04-08 MED ORDER — ONDANSETRON HCL 4 MG PO TABS: 4.0000 mg | ORAL_TABLET | Freq: Four times a day (QID) | ORAL | Status: DC | PRN

## 2016-04-08 MED ORDER — SENNOSIDES-DOCUSATE SODIUM 8.6-50 MG PO TABS
1.0000 | ORAL_TABLET | Freq: Every evening | ORAL | 1 refills | Status: DC | PRN
Start: 2016-04-08 — End: 2018-03-10

## 2016-04-08 MED ORDER — CEFAZOLIN SODIUM-DEXTROSE 2-4 GM/100ML-% IV SOLN
2.0000 g | Freq: Four times a day (QID) | INTRAVENOUS | Status: AC
  Administered 2016-04-08 (×2): 2 g via INTRAVENOUS
  Filled 2016-04-08 (×2): qty 100

## 2016-04-08 MED ORDER — LOPERAMIDE HCL 2 MG PO CAPS
2.0000 mg | ORAL_CAPSULE | Freq: Every day | ORAL | Status: DC
  Administered 2016-04-09: 2 mg via ORAL
  Filled 2016-04-08: qty 1

## 2016-04-08 MED ORDER — GENTAMICIN SULFATE 40 MG/ML IJ SOLN
INTRAMUSCULAR | Status: AC
  Filled 2016-04-08: qty 4

## 2016-04-08 MED ORDER — HYDROCHLOROTHIAZIDE 25 MG PO TABS
25.0000 mg | ORAL_TABLET | Freq: Every day | ORAL | Status: DC
  Administered 2016-04-08 – 2016-04-10 (×3): 25 mg via ORAL
  Filled 2016-04-08 (×3): qty 1

## 2016-04-08 MED ORDER — OXYCODONE HCL ER 10 MG PO T12A
10.0000 mg | EXTENDED_RELEASE_TABLET | Freq: Two times a day (BID) | ORAL | Status: DC
  Administered 2016-04-08 – 2016-04-09 (×4): 10 mg via ORAL
  Filled 2016-04-08 (×4): qty 1

## 2016-04-08 MED ORDER — SODIUM CHLORIDE 0.9 % IV SOLN
INTRAVENOUS | Status: DC
  Administered 2016-04-08: 14:00:00 via INTRAVENOUS

## 2016-04-08 MED ORDER — METOCLOPRAMIDE HCL 5 MG/ML IJ SOLN: 5.0000 mg | Freq: Three times a day (TID) | INTRAMUSCULAR | Status: DC | PRN

## 2016-04-08 MED ORDER — POVIDONE-IODINE 10 % EX SOLN
CUTANEOUS | Status: DC | PRN
  Administered 2016-04-08: 1 via TOPICAL

## 2016-04-08 MED ORDER — LACTATED RINGERS IV SOLN
INTRAVENOUS | Status: DC | PRN
  Administered 2016-04-08 (×2): via INTRAVENOUS

## 2016-04-08 MED ORDER — ACETAMINOPHEN 500 MG PO TABS
1000.0000 mg | ORAL_TABLET | Freq: Four times a day (QID) | ORAL | Status: AC
  Administered 2016-04-08 – 2016-04-09 (×4): 1000 mg via ORAL
  Filled 2016-04-08 (×4): qty 2

## 2016-04-08 MED ORDER — PROPOFOL 500 MG/50ML IV EMUL
INTRAVENOUS | Status: DC | PRN
  Administered 2016-04-08: 50 ug/kg/min via INTRAVENOUS

## 2016-04-08 MED ORDER — ACETAMINOPHEN 650 MG RE SUPP: 650.0000 mg | Freq: Four times a day (QID) | RECTAL | Status: DC | PRN

## 2016-04-08 MED ORDER — ONDANSETRON HCL 4 MG/2ML IJ SOLN: 4.0000 mg | Freq: Four times a day (QID) | INTRAMUSCULAR | Status: DC | PRN

## 2016-04-08 MED ORDER — ACETAMINOPHEN 325 MG PO TABS: 650.0000 mg | ORAL_TABLET | Freq: Four times a day (QID) | ORAL | Status: DC | PRN

## 2016-04-08 MED ORDER — OXYCODONE HCL 5 MG PO TABS
5.0000 mg | ORAL_TABLET | ORAL | Status: DC | PRN
  Administered 2016-04-08 – 2016-04-10 (×8): 5 mg via ORAL
  Filled 2016-04-08 (×7): qty 1

## 2016-04-08 MED ORDER — PHENOL 1.4 % MT LIQD: 1.0000 | OROMUCOSAL | Status: DC | PRN

## 2016-04-08 MED ORDER — ONDANSETRON HCL 4 MG PO TABS: 4.0000 mg | ORAL_TABLET | Freq: Three times a day (TID) | ORAL | 0 refills | Status: DC | PRN

## 2016-04-08 MED ORDER — SORBITOL 70 % SOLN: 30.0000 mL | Freq: Every day | Status: DC | PRN

## 2016-04-08 MED ORDER — MAGNESIUM CITRATE PO SOLN: 1.0000 | Freq: Once | ORAL | Status: DC | PRN

## 2016-04-08 MED ORDER — METOCLOPRAMIDE HCL 5 MG PO TABS: 5.0000 mg | ORAL_TABLET | Freq: Three times a day (TID) | ORAL | Status: DC | PRN

## 2016-04-08 MED ORDER — APIXABAN 5 MG PO TABS
5.0000 mg | ORAL_TABLET | Freq: Two times a day (BID) | ORAL | Status: DC
  Administered 2016-04-08 – 2016-04-10 (×4): 5 mg via ORAL
  Filled 2016-04-08 (×4): qty 1

## 2016-04-08 MED ORDER — 0.9 % SODIUM CHLORIDE (POUR BTL) OPTIME
TOPICAL | Status: DC | PRN
  Administered 2016-04-08: 1000 mL

## 2016-04-08 MED ORDER — OXYCODONE HCL ER 10 MG PO T12A: 10.0000 mg | EXTENDED_RELEASE_TABLET | Freq: Two times a day (BID) | ORAL | 0 refills | Status: DC

## 2016-04-08 MED ORDER — MIDAZOLAM HCL 2 MG/2ML IJ SOLN
INTRAMUSCULAR | Status: AC
  Filled 2016-04-08: qty 2

## 2016-04-08 MED ORDER — TRANEXAMIC ACID 1000 MG/10ML IV SOLN
2000.0000 mg | INTRAVENOUS | Status: AC
  Administered 2016-04-08: 2000 mg via TOPICAL
  Filled 2016-04-08: qty 20

## 2016-04-08 MED ORDER — METHOCARBAMOL 750 MG PO TABS: 750.0000 mg | ORAL_TABLET | Freq: Two times a day (BID) | ORAL | 0 refills | Status: DC | PRN

## 2016-04-08 MED ORDER — VITAMIN D 1000 UNITS PO TABS
5000.0000 [IU] | ORAL_TABLET | Freq: Every day | ORAL | Status: DC
  Administered 2016-04-09 – 2016-04-10 (×2): 5000 [IU] via ORAL
  Filled 2016-04-08 (×2): qty 5

## 2016-04-08 MED ORDER — FENTANYL CITRATE (PF) 100 MCG/2ML IJ SOLN
25.0000 ug | INTRAMUSCULAR | Status: DC | PRN
  Administered 2016-04-08 (×4): 25 ug via INTRAVENOUS

## 2016-04-08 MED ORDER — PROPOFOL 10 MG/ML IV BOLUS
INTRAVENOUS | Status: AC
  Filled 2016-04-08: qty 20

## 2016-04-08 MED ORDER — KETOROLAC TROMETHAMINE 30 MG/ML IJ SOLN
30.0000 mg | Freq: Four times a day (QID) | INTRAMUSCULAR | Status: DC | PRN
  Administered 2016-04-08: 30 mg via INTRAVENOUS

## 2016-04-08 MED ORDER — MENTHOL 3 MG MT LOZG: 1.0000 | LOZENGE | OROMUCOSAL | Status: DC | PRN

## 2016-04-08 MED ORDER — PANTOPRAZOLE SODIUM 40 MG PO TBEC
40.0000 mg | DELAYED_RELEASE_TABLET | Freq: Every day | ORAL | Status: DC
  Administered 2016-04-08 – 2016-04-10 (×3): 40 mg via ORAL
  Filled 2016-04-08 (×3): qty 1

## 2016-04-08 MED ORDER — BUPIVACAINE-EPINEPHRINE (PF) 0.5% -1:200000 IJ SOLN
INTRAMUSCULAR | Status: DC | PRN
  Administered 2016-04-08: 25 mL via PERINEURAL

## 2016-04-08 SURGICAL SUPPLY — 65 items
ALCOHOL ISOPROPYL (RUBBING) (MISCELLANEOUS) ×3 IMPLANT
BAG DECANTER FOR FLEXI CONT (MISCELLANEOUS) ×3 IMPLANT
BANDAGE ACE 6X5 VEL STRL LF (GAUZE/BANDAGES/DRESSINGS) ×3 IMPLANT
BANDAGE ESMARK 6X9 LF (GAUZE/BANDAGES/DRESSINGS) ×1 IMPLANT
BENZOIN TINCTURE PRP APPL 2/3 (GAUZE/BANDAGES/DRESSINGS) ×3 IMPLANT
BLADE SAW SGTL 13.0X1.19X90.0M (BLADE) ×3 IMPLANT
BNDG ESMARK 6X9 LF (GAUZE/BANDAGES/DRESSINGS) ×3
BONE CEMENT PALACOSE (Orthopedic Implant) ×6 IMPLANT
BOWL SMART MIX CTS (DISPOSABLE) ×3 IMPLANT
CAPT KNEE TOTAL 3 ×3 IMPLANT
CATH ROBINSON RED A/P 14FR (CATHETERS) ×3 IMPLANT
CEMENT BONE PALACOSE (Orthopedic Implant) ×2 IMPLANT
CLOSURE STERI-STRIP 1/2X4 (GAUZE/BANDAGES/DRESSINGS) ×2
CLOSURE WOUND 1/2 X4 (GAUZE/BANDAGES/DRESSINGS) ×1
CLSR STERI-STRIP ANTIMIC 1/2X4 (GAUZE/BANDAGES/DRESSINGS) ×4 IMPLANT
COVER SURGICAL LIGHT HANDLE (MISCELLANEOUS) ×3 IMPLANT
CUFF TOURNIQUET SINGLE 34IN LL (TOURNIQUET CUFF) ×3 IMPLANT
CUFF TOURNIQUET SINGLE 44IN (TOURNIQUET CUFF) IMPLANT
DRAPE EXTREMITY T 121X128X90 (DRAPE) ×3 IMPLANT
DRAPE INCISE IOBAN 66X45 STRL (DRAPES) IMPLANT
DRAPE ORTHO SPLIT 77X108 STRL (DRAPES) ×4
DRAPE PROXIMA HALF (DRAPES) ×3 IMPLANT
DRAPE SURG 17X11 SM STRL (DRAPES) ×6 IMPLANT
DRAPE SURG ORHT 6 SPLT 77X108 (DRAPES) ×2 IMPLANT
DRSG AQUACEL AG ADV 3.5X14 (GAUZE/BANDAGES/DRESSINGS) ×3 IMPLANT
DURAPREP 26ML APPLICATOR (WOUND CARE) ×9 IMPLANT
ELECT CAUTERY BLADE 6.4 (BLADE) ×3 IMPLANT
ELECT REM PT RETURN 9FT ADLT (ELECTROSURGICAL) ×3
ELECTRODE REM PT RTRN 9FT ADLT (ELECTROSURGICAL) ×1 IMPLANT
GLOVE SKINSENSE NS SZ7.5 (GLOVE) ×2
GLOVE SKINSENSE STRL SZ7.5 (GLOVE) ×1 IMPLANT
GLOVE SURG SYN 7.5  E (GLOVE) ×8
GLOVE SURG SYN 7.5 E (GLOVE) ×4 IMPLANT
GOWN STRL REIN XL XLG (GOWN DISPOSABLE) ×3 IMPLANT
GOWN STRL REUS W/ TWL LRG LVL3 (GOWN DISPOSABLE) ×1 IMPLANT
GOWN STRL REUS W/TWL LRG LVL3 (GOWN DISPOSABLE) ×2
HANDPIECE INTERPULSE COAX TIP (DISPOSABLE) ×2
HOOD PEEL AWAY FLYTE STAYCOOL (MISCELLANEOUS) ×9 IMPLANT
KIT BASIN OR (CUSTOM PROCEDURE TRAY) ×3 IMPLANT
KIT ROOM TURNOVER OR (KITS) ×3 IMPLANT
MANIFOLD NEPTUNE II (INSTRUMENTS) ×3 IMPLANT
NEEDLE SPNL 18GX3.5 QUINCKE PK (NEEDLE) ×3 IMPLANT
NS IRRIG 1000ML POUR BTL (IV SOLUTION) ×3 IMPLANT
PACK TOTAL JOINT (CUSTOM PROCEDURE TRAY) ×3 IMPLANT
PAD ARMBOARD 7.5X6 YLW CONV (MISCELLANEOUS) ×6 IMPLANT
PEN SKIN MARKING BROAD (MISCELLANEOUS) ×3 IMPLANT
SAW OSC TIP CART 19.5X105X1.3 (SAW) ×3 IMPLANT
SEALER BIPOLAR AQUA 6.0 (INSTRUMENTS) ×3 IMPLANT
SET HNDPC FAN SPRY TIP SCT (DISPOSABLE) ×1 IMPLANT
STAPLER VISISTAT 35W (STAPLE) IMPLANT
STRIP CLOSURE SKIN 1/2X4 (GAUZE/BANDAGES/DRESSINGS) ×2 IMPLANT
SUCTION FRAZIER HANDLE 10FR (MISCELLANEOUS) ×2
SUCTION TUBE FRAZIER 10FR DISP (MISCELLANEOUS) ×1 IMPLANT
SUT ETHILON 2 0 FS 18 (SUTURE) IMPLANT
SUT MNCRL AB 4-0 PS2 18 (SUTURE) IMPLANT
SUT VIC AB 0 CT1 27 (SUTURE) ×4
SUT VIC AB 0 CT1 27XBRD ANBCTR (SUTURE) ×2 IMPLANT
SUT VIC AB 1 CTX 27 (SUTURE) ×12 IMPLANT
SUT VIC AB 2-0 CT1 27 (SUTURE) ×8
SUT VIC AB 2-0 CT1 TAPERPNT 27 (SUTURE) ×4 IMPLANT
SYR 20CC LL (SYRINGE) ×3 IMPLANT
SYR 50ML LL SCALE MARK (SYRINGE) ×3 IMPLANT
TOWEL OR 17X24 6PK STRL BLUE (TOWEL DISPOSABLE) ×3 IMPLANT
TOWEL OR 17X26 10 PK STRL BLUE (TOWEL DISPOSABLE) ×3 IMPLANT
WRAP KNEE MAXI GEL POST OP (GAUZE/BANDAGES/DRESSINGS) ×3 IMPLANT

## 2016-04-08 NOTE — Anesthesia Postprocedure Evaluation (Signed)
Anesthesia Post Note  Patient: Jim Dodson  Procedure(s) Performed: Procedure(s) (LRB): LEFT TOTAL KNEE ARTHROPLASTY (Left)  Patient location during evaluation: PACU Anesthesia Type: Spinal Level of consciousness: oriented and awake and alert Pain management: pain level controlled Vital Signs Assessment: post-procedure vital signs reviewed and stable Respiratory status: spontaneous breathing, respiratory function stable and patient connected to nasal cannula oxygen Cardiovascular status: blood pressure returned to baseline and stable Postop Assessment: no headache and no backache Anesthetic complications: no    Last Vitals:  Vitals:   04/08/16 1048 04/08/16 1103  BP: 111/77 139/85  Pulse: (!) 26 (!) 59  Resp: (!) 23 (!) 7  Temp: 36.4 C     Last Pain:  Vitals:   04/08/16 1048  TempSrc:   PainSc: Asleep    LLE Motor Response: Purposeful movement (04/08/16 1115)   RLE Motor Response: Purposeful movement (04/08/16 1115)   L Sensory Level: L5-Outer lower leg, top of foot, great toe (04/08/16 1115) R Sensory Level: L5-Outer lower leg, top of foot, great toe (04/08/16 1115)  Reginal Lutes

## 2016-04-08 NOTE — Transfer of Care (Signed)
Immediate Anesthesia Transfer of Care Note  Patient: Jim Dodson  Procedure(s) Performed: Procedure(s): LEFT TOTAL KNEE ARTHROPLASTY (Left)  Patient Location: PACU  Anesthesia Type:MAC combined with regional for post-op pain  Level of Consciousness: sedated and patient cooperative  Airway & Oxygen Therapy: Patient Spontanous Breathing and Patient connected to nasal cannula oxygen  Post-op Assessment: Report given to RN, Post -op Vital signs reviewed and stable and Patient moving all extremities  Post vital signs: Reviewed and stable  Last Vitals:  Vitals:   04/08/16 0708  BP: 135/78  Pulse: 65  Resp: 20  Temp: 36.7 C    Last Pain:  Vitals:   04/08/16 0708  TempSrc: Oral  PainSc:          Complications: No apparent anesthesia complications

## 2016-04-08 NOTE — Discharge Instructions (Signed)
INSTRUCTIONS AFTER JOINT REPLACEMENT   o Remove items at home which could result in a fall. This includes throw rugs or furniture in walking pathways o ICE to the affected joint every three hours while awake for 30 minutes at a time, for at least the first 3-5 days, and then as needed for pain and swelling.  Continue to use ice for pain and swelling. You may notice swelling that will progress down to the foot and ankle.  This is normal after surgery.  Elevate your leg when you are not up walking on it.   o Continue to use the breathing machine you got in the hospital (incentive spirometer) which will help keep your temperature down.  It is common for your temperature to cycle up and down following surgery, especially at night when you are not up moving around and exerting yourself.  The breathing machine keeps your lungs expanded and your temperature down.  Information on my medicine - ELIQUIS (apixaban)  This medication education was reviewed with me or my healthcare representative as part of my discharge preparation.   Why was Eliquis prescribed for you? Eliquis was prescribed for you to reduce the risk of a blood clot forming that can cause a stroke if you have a medical condition called atrial fibrillation (a type of irregular heartbeat).  What do You need to know about Eliquis ? Take your Eliquis TWICE DAILY - one tablet in the morning and one tablet in the evening with or without food. If you have difficulty swallowing the tablet whole please discuss with your pharmacist how to take the medication safely.  Take Eliquis exactly as prescribed by your doctor and DO NOT stop taking Eliquis without talking to the doctor who prescribed the medication.  Stopping may increase your risk of developing a stroke.  Refill your prescription before you run out.  After discharge, you should have regular check-up appointments with your healthcare provider that is prescribing your Eliquis.  In the  future your dose may need to be changed if your kidney function or weight changes by a significant amount or as you get older.  What do you do if you miss a dose? If you miss a dose, take it as soon as you remember on the same day and resume taking twice daily.  Do not take more than one dose of ELIQUIS at the same time to make up a missed dose.  Important Safety Information A possible side effect of Eliquis is bleeding. You should call your healthcare provider right away if you experience any of the following: ? Bleeding from an injury or your nose that does not stop. ? Unusual colored urine (red or dark brown) or unusual colored stools (red or black). ? Unusual bruising for unknown reasons. ? A serious fall or if you hit your head (even if there is no bleeding).  Some medicines may interact with Eliquis and might increase your risk of bleeding or clotting while on Eliquis. To help avoid this, consult your healthcare provider or pharmacist prior to using any new prescription or non-prescription medications, including herbals, vitamins, non-steroidal anti-inflammatory drugs (NSAIDs) and supplements.  This website has more information on Eliquis (apixaban): http://www.eliquis.com/eliquis/home  DIET:  As you were doing prior to hospitalization, we recommend a well-balanced diet.  DRESSING / WOUND CARE / SHOWERING  You may change your surgical dressing 7 days after surgery.  Then change the dressing every day with sterile gauze.  Please use good hand washing techniques before  changing the dressing.  Do not use any lotions or creams on the incision until instructed by your surgeon.  You may shower while you have the surgical dressing which is waterproof.  After removal of surgical dressing, you must cover the incision when showering.  ACTIVITY  o Increase activity slowly as tolerated, but follow the weight bearing instructions below.   o No driving for 6 weeks or until further direction  given by your physician.  You cannot drive while taking narcotics.  o No lifting or carrying greater than 10 lbs. until further directed by your surgeon. o Avoid periods of inactivity such as sitting longer than an hour when not asleep. This helps prevent blood clots.  o You may return to work once you are authorized by your doctor.     WEIGHT BEARING   Weight bearing as tolerated with assist device (Deleo, cane, etc) as directed, use it as long as suggested by your surgeon or therapist, typically at least 4-6 weeks.   EXERCISES  Results after joint replacement surgery are often greatly improved when you follow the exercise, range of motion and muscle strengthening exercises prescribed by your doctor. Safety measures are also important to protect the joint from further injury. Any time any of these exercises cause you to have increased pain or swelling, decrease what you are doing until you are comfortable again and then slowly increase them. If you have problems or questions, call your caregiver or physical therapist for advice.   Rehabilitation is important following a joint replacement. After just a few days of immobilization, the muscles of the leg can become weakened and shrink (atrophy).  These exercises are designed to build up the tone and strength of the thigh and leg muscles and to improve motion. Often times heat used for twenty to thirty minutes before working out will loosen up your tissues and help with improving the range of motion but do not use heat for the first two weeks following surgery (sometimes heat can increase post-operative swelling).   These exercises can be done on a training (exercise) mat, on the floor, on a table or on a bed. Use whatever works the best and is most comfortable for you.    Use music or television while you are exercising Jim Dodson that the exercises are a pleasant break in your day. This will make your life better with the exercises acting as a break in  your routine that you can look forward to.   Perform all exercises about fifteen times, three times per day or as directed.  You should exercise both the operative leg and the other leg as well.  Exercises include:    Quad Sets - Tighten up the muscle on the front of the thigh (Quad) and hold for 5-10 seconds.    Straight Leg Raises - With your knee straight (if you were given a brace, keep it on), lift the leg to 60 degrees, hold for 3 seconds, and slowly lower the leg.  Perform this exercise against resistance later as your leg gets stronger.   Leg Slides: Lying on your back, slowly slide your foot toward your buttocks, bending your knee up off the floor (only go as far as is comfortable). Then slowly slide your foot back down until your leg is flat on the floor again.   Angel Wings: Lying on your back spread your legs to the side as far apart as you can without causing discomfort.   Hamstring Strength:  Lying  on your back, push your heel against the floor with your leg straight by tightening up the muscles of your buttocks.  Repeat, but this time bend your knee to a comfortable angle, and push your heel against the floor.  You may put a pillow under the heel to make it more comfortable if necessary.   A rehabilitation program following joint replacement surgery can speed recovery and prevent re-injury in the future due to weakened muscles. Contact your doctor or a physical therapist for more information on knee rehabilitation.    CONSTIPATION  Constipation is defined medically as fewer than three stools per week and severe constipation as less than one stool per week.  Even if you have a regular bowel pattern at home, your normal regimen is likely to be disrupted due to multiple reasons following surgery.  Combination of anesthesia, postoperative narcotics, change in appetite and fluid intake all can affect your bowels.   YOU MUST use at least one of the following options; they are listed in  order of increasing strength to get the job done.  They are all available over the counter, and you may need to use some, POSSIBLY even all of these options:    Drink plenty of fluids (prune juice may be helpful) and high fiber foods Colace 100 mg by mouth twice a day  Senokot for constipation as directed and as needed Dulcolax (bisacodyl), take with full glass of water  Miralax (polyethylene glycol) once or twice a day as needed.  If you have tried all these things and are unable to have a bowel movement in the first 3-4 days after surgery call either your surgeon or your primary doctor.    If you experience loose stools or diarrhea, hold the medications until you stool forms back up.  If your symptoms do not get better within 1 week or if they get worse, check with your doctor.  If you experience "the worst abdominal pain ever" or develop nausea or vomiting, please contact the office immediately for further recommendations for treatment.   ITCHING:  If you experience itching with your medications, try taking only a single pain pill, or even half a pain pill at a time.  You can also use Benadryl over the counter for itching or also to help with sleep.   TED HOSE STOCKINGS:  Use stockings on both legs until for at least 2 weeks or as directed by physician office. They may be removed at night for sleeping.  MEDICATIONS:  See your medication summary on the After Visit Summary that nursing will review with you.  You may have some home medications which will be placed on hold until you complete the course of blood thinner medication.  It is important for you to complete the blood thinner medication as prescribed.  PRECAUTIONS:  If you experience chest pain or shortness of breath - call 911 immediately for transfer to the hospital emergency department.   If you develop a fever greater that 101 F, purulent drainage from wound, increased redness or drainage from wound, foul odor from the  wound/dressing, or calf pain - CONTACT YOUR SURGEON.                                                   FOLLOW-UP APPOINTMENTS:  If you do not already have a post-op appointment,  please call the office for an appointment to be seen by your surgeon.  Guidelines for how soon to be seen are listed in your After Visit Summary, but are typically between 1-4 weeks after surgery.  OTHER INSTRUCTIONS:   Knee Replacement:  Do not place pillow under knee, focus on keeping the knee straight while resting. CPM instructions: 0-90 degrees, 2 hours in the morning, 2 hours in the afternoon, and 2 hours in the evening. Place foam block, curve side up under heel at all times except when in CPM or when walking.  DO NOT modify, tear, cut, or change the foam block in any way.  MAKE SURE YOU:   Understand these instructions.   Get help right away if you are not doing well or get worse.    Thank you for letting us be a part of your medical care team.  It is a privilege we respect greatly.  We hope these instructions will help you stay on track for a fast and full recovery!

## 2016-04-08 NOTE — Op Note (Signed)
Total Knee Arthroplasty Procedure Note [name] [id number] 04/08/2016   Preoperative diagnosis: Left knee osteoarthritis  Postoperative diagnosis:same  Operative procedure: Left total knee arthroplasty. CPT [phone]  Surgeon: N. Eduard Roux, MD  Assistants: April Green, RNFA  Anesthesia: Spinal, regional  Tourniquet time: 105   Implants used: [employer] Femur: 5, PS Tibia: 5 Patella: 32 mm, 7.5 thick Polyethylene: 13 mm  Indication: Jim Dodson is a 75 y.o. year old male with a history of knee pain. Having failed conservative management, the patient elected to proceed with a total knee arthroplasty.  We have reviewed the risk and benefits of the surgery and they elected to proceed after voicing understanding.  Procedure:  After informed consent was obtained and understanding of the risk were voiced including but not limited to bleeding, infection, damage to surrounding structures including nerves and vessels, blood clots, leg length inequality and the failure to achieve desired results, the operative extremity was marked with verbal confirmation of the patient in the holding area.   The patient was then brought to the operating room and transported to the operating room table in the supine position.  A tourniquet was applied to the operative extremity around the upper thigh. The operative limb was then prepped and draped in the usual sterile fashion and preoperative antibiotics were administered.  A time out was performed prior to the start of surgery confirming the correct extremity, preoperative antibiotic administration, as well as team members, implants and instruments available for the case. Correct surgical site was also confirmed with preoperative radiographs. The limb was then elevated for exsanguination and the tourniquet was inflated. A midline incision was made and a standard medial parapatellar approach was performed.  The patella was prepared and  sized to a 32 mm.  A cover was placed on the patella for protection from retractors.  We then turned our attention to the femur. Posterior cruciate ligament was sacrificed. Start site was drilled in the femur and the intramedullary distal femoral cutting guide was placed, set at 5 degrees valgus, taking 9 mm of distal resection. The distal cut was made. Osteophytes were then removed. Next, the proximal tibial cutting guide was placed with appropriate slope, varus/valgus alignment and depth of resection. The proximal tibial cut was made. Gap blocks were then used to assess the extension gap and alignment, and appropriate soft tissue releases were performed. Attention was turned back to the femur, which was sized using the sizing guide to a size 5. Appropriate rotation of the femoral component was determined using epicondylar axis, Whiteside's line, and assessing the flexion gap under ligament tension. The appropriate size 4-in-1 cutting block was placed and cuts were made. Posterior femoral osteophytes and uncapped bone were then removed with the curved osteotome. The tibia was sized for a size 5 component. The femoral box-cutting guide was placed and prepared for a PS femoral component. Trial components were placed, and stability was checked in full extension, mid-flexion, and deep flexion. Proper tibial rotation was determined and marked.  The patella tracked well without a lateral release. Trial components were then removed and tibial preparation performed. A posterior capsular injection comprising of 20 cc of 1.3% exparel and 40 cc of normal saline was performed for postoperative pain control. The bony surfaces were irrigated with a pulse lavage and then dried. Bone cement was vacuum mixed on the back table, and the final components sized above were cemented into place. After cement had finished curing, excess cement was  removed. The stability of the construct was re-evaluated throughout a range of motion and  found to be acceptable. The trial liner was removed, the knee was copiously irrigated, and the knee was re-evaluated for any excess bone debris. The real polyethylene liner, 13 mm thick, was inserted and checked to ensure the locking mechanism had engaged appropriately. The tourniquet was deflated and hemostasis was achieved. The wound was irrigated with dilute betadine in normal saline, and then again with normal saline. A drain was not placed. Capsular closure was performed with a #1 vicryl, subcutaneous fat closed with a 2.0 vicryl suture, then subcutaneous tissue closed with interrupted 2.0 vicryl suture. The skin was then closed with a 3.0 monocryl. A sterile dressing was applied.   The patient was awakened in the operating room and taken to recovery in stable condition. All sponge, needle, and instrument counts were correct at the end of the case.  Position: supine  Complications: none.  Time Out: performed   Drains/Packing: none  Estimated blood loss: 50 cc  Returned to Recovery Room: in good condition.   Antibiotics: yes   Mechanical VTE (DVT) Prophylaxis: sequential compression devices, TED thigh-high  Chemical VTE (DVT) Prophylaxis: aspirin  Fluid Replacement  Crystalloid: see anesthesia record Blood: none  FFP: none   Specimens Removed: 1 to pathology   Sponge and Instrument Count Correct? yes   PACU: portable radiograph - knee AP and Lateral   Admission: inpatient status, start PT & OT POD#1  Plan/RTC: Return in 2 weeks for wound check.   Weight Bearing/Load Lower Extremity: full   N. Eduard Roux, MD [place] 10:18 AM

## 2016-04-08 NOTE — Progress Notes (Signed)
Orthopedic Tech Progress Note Patient Details:  Jim Dodson 1941-05-27 [id number]  Ortho Devices Ortho Device/Splint Location: trapeze bar patient helper Ortho Device/Splint Interventions: Application   Hildred Priest 04/08/2016, 2:45 PM Viewed order from doctor's order list

## 2016-04-08 NOTE — Anesthesia Procedure Notes (Signed)
Procedure Name: MAC Date/Time: 04/08/2016 9:05 AM Performed by: Melina Copa, Alexy Bringle R Pre-anesthesia Checklist: Patient identified, Emergency Drugs available, Suction available, Patient being monitored and Timeout performed Patient Re-evaluated:Patient Re-evaluated prior to inductionOxygen Delivery Method: Simple face mask Ventilation: Nasal airway inserted- appropriate to patient size Placement Confirmation: positive ETCO2 Dental Injury: Teeth and Oropharynx as per pre-operative assessment

## 2016-04-08 NOTE — Anesthesia Preprocedure Evaluation (Signed)
Anesthesia Evaluation  Patient identified by MRN, date of birth, ID band Patient awake    Reviewed: Allergy & Precautions, NPO status , Patient's Chart, lab work & pertinent test results, reviewed documented beta blocker date and time   Airway Mallampati: III  TM Distance: >3 FB Neck ROM: Limited    Dental  (+) Chipped, Partial Lower   Pulmonary shortness of breath and with exertion,    Pulmonary exam normal breath sounds clear to auscultation       Cardiovascular hypertension, Pt. on medications and Pt. on home beta blockers + CAD  Normal cardiovascular exam+ dysrhythmias Atrial Fibrillation      Neuro/Psych negative neurological ROS  negative psych ROS   GI/Hepatic Neg liver ROS, GERD  Medicated,Hx of SBO   Endo/Other  negative endocrine ROS  Renal/GU negative Renal ROS  negative genitourinary   Musculoskeletal negative musculoskeletal ROS (+)   Abdominal Normal abdominal exam  (+)   Peds negative pediatric ROS (+)  Hematology negative hematology ROS (+) REFUSES BLOOD PRODUCTS,   Anesthesia Other Findings Prostate CA HX  Reproductive/Obstetrics                             Anesthesia Physical  Anesthesia Plan  ASA: III  Anesthesia Plan: Spinal and Regional   Post-op Pain Management:  Regional for Post-op pain   Induction:   Airway Management Planned: Nasal Cannula  Additional Equipment:   Intra-op Plan:   Post-operative Plan:   Informed Consent: I have reviewed the patients History and Physical, chart, labs and discussed the procedure including the risks, benefits and alternatives for the proposed anesthesia with the patient or authorized representative who has indicated his/her understanding and acceptance.   Dental advisory given  Plan Discussed with: CRNA and Surgeon  Anesthesia Plan Comments:         Anesthesia Quick Evaluation

## 2016-04-08 NOTE — H&P (Signed)
PREOPERATIVE H&P  Chief Complaint: left knee degenerative joint disease  HPI: Jim Dodson is a 75 y.o. male who presents for surgical treatment of left knee degenerative joint disease.  He denies any changes in medical history.  Past Medical History:  Diagnosis Date  . Atrial fibrillation ([place])   . Coronary artery disease    stent placed 2009  . Headache   . History of hiatal hernia   . Hypertension   . Prostate cancer [facility])    prostate cancer, had sx  . Shortness of breath dyspnea    Past Surgical History:  Procedure Laterality Date  . CARDIAC CATHETERIZATION     2009, stent placed  . CARDIOVERSION N/A 03/13/2015   Procedure: CARDIOVERSION;  Surgeon: Jerline Pain, MD;  Location: [place];  Service: Cardiovascular;  Laterality: N/A;  . COLONOSCOPY WITH PROPOFOL N/A 07/19/2015   Procedure: COLONOSCOPY WITH PROPOFOL;  Surgeon: Manya Silvas, MD;  Location: [facility] ENDOSCOPY;  Service: Endoscopy;  Laterality: N/A;  . cryo surgery of prostate    . ESOPHAGOGASTRODUODENOSCOPY (EGD) WITH PROPOFOL N/A 07/19/2015   Procedure: ESOPHAGOGASTRODUODENOSCOPY (EGD) WITH PROPOFOL;  Surgeon: Manya Silvas, MD;  Location: [facility] ENDOSCOPY;  Service: Endoscopy;  Laterality: N/A;  . LAPAROTOMY N/A 03/08/2015   Procedure: EXPLORATORY LAPAROTOMY;  Surgeon: Autumn Messing III, MD;  Location: [place];  Service: General;  Laterality: N/A;  . LAPAROTOMY N/A 03/09/2015   Procedure: EXPLORATORY LAPAROTOMY AND EVACUATION OF HEMATOMA;  Surgeon: Autumn Messing III, MD;  Location: [place];  Service: General;  Laterality: N/A;  . LYSIS OF ADHESION N/A 03/08/2015   Procedure: LYSIS OF ADHESION;  Surgeon: Autumn Messing III, MD;  Location: [place];  Service: General;  Laterality: N/A;  . OMENTECTOMY N/A 03/08/2015   Procedure: OMENTECTOMY;  Surgeon: Autumn Messing III, MD;  Location: [place];  Service: General;  Laterality: N/A;  . TEE WITHOUT CARDIOVERSION N/A 03/13/2015   Procedure: TRANSESOPHAGEAL ECHOCARDIOGRAM (TEE);  Surgeon:  Jerline Pain, MD;  Location: [place];  Service: Cardiovascular;  Laterality: N/A;  . UMBILICAL HERNIA REPAIR  03/08/2015   Procedure: UMBILICAL HERNIA REPAIR  ADULT;  Surgeon: Autumn Messing III, MD;  Location: [place];  Service: General;;  . VENTRAL HERNIA REPAIR N/A 03/08/2015   Procedure:  VENTRAL HERNIA  REPAIR ADULT;  Surgeon: Autumn Messing III, MD;  Location: [place];  Service: General;  Laterality: N/A;   Social History   Social History  . Marital status: Married    Spouse name: N/A  . Number of children: N/A  . Years of education: N/A   Social History Main Topics  . Smoking status: Never Smoker  . Smokeless tobacco: Never Used  . Alcohol use No  . Drug use: No  . Sexual activity: Not Asked   Other Topics Concern  . None   Social History Narrative  . None   History reviewed. No pertinent family history. Allergies  Allergen Reactions  . Statins Swelling and Other (See Comments)    Legs swelled up and cramped up really bad   Prior to Admission medications   Medication Sig Start Date End Date Taking? Authorizing Provider  acetaminophen (TYLENOL) 500 MG tablet Take 1,000 mg by mouth 2 (two) times daily.   Yes Historical Provider, MD  apixaban (ELIQUIS) 5 MG TABS tablet Take 5 mg by mouth 2 (two) times daily.   Yes Historical Provider, MD  Cholecalciferol (VITAMIN D3) 5000 units TABS Take 1 tablet by mouth daily.   Yes  Historical Provider, MD  Hyaluronic Acid-Vitamin C (HYALURONIC ACID PO) Take 100 mg elemental calcium/kg/hr by mouth daily.   Yes Historical Provider, MD  hydrALAZINE (APRESOLINE) 50 MG tablet Take 1 tablet (50 mg total) by mouth every 8 (eight) hours. 07/27/15  Yes Thurnell Lose, MD  hydrochlorothiazide (HYDRODIURIL) 25 MG tablet Take 25 mg by mouth daily.   Yes Historical Provider, MD  lisinopril (PRINIVIL,ZESTRIL) 20 MG tablet Take 20 mg by mouth daily.   Yes Historical Provider, MD  loperamide (ANTI-DIARRHEAL) 2 MG capsule Take 2 mg by mouth every morning.   Yes  Historical Provider, MD  MAGNESIUM-POTASSIUM PO Take 1 tablet by mouth 2 (two) times daily.    Yes Historical Provider, MD  meclizine (ANTIVERT) 12.5 MG tablet Take 12.5 mg by mouth 3 (three) times daily as needed for dizziness.   Yes Historical Provider, MD  metoprolol (LOPRESSOR) 100 MG tablet Take 1 tablet (100 mg total) by mouth 2 (two) times daily. 07/27/15  Yes Thurnell Lose, MD  Nutritional Supplements (NUTRITIONAL SUPPLEMENT PO) Take 2 tablets by mouth 4 (four) times daily. [place]: 40% ketosterones/ 20% 3-ketosterone    Yes Historical Provider, MD  omeprazole (PRILOSEC) 20 MG capsule Take 20 mg by mouth daily.   Yes Historical Provider, MD  Red Yeast Rice Extract 600 MG CAPS Take 1,200 mg by mouth 2 (two) times daily.   Yes Historical Provider, MD     Positive ROS: All other systems have been reviewed and were otherwise negative with the exception of those mentioned in the HPI and as above.  Physical Exam: General: Alert, no acute distress Cardiovascular: No pedal edema Respiratory: No cyanosis, no use of accessory musculature GI: abdomen soft Skin: No lesions in the area of chief complaint Neurologic: Sensation intact distally Psychiatric: Patient is competent for consent with normal mood and affect Lymphatic: no lymphedema  MUSCULOSKELETAL: exam stable  Assessment: left knee degenerative joint disease  Plan: Plan for Procedure(s): LEFT TOTAL KNEE ARTHROPLASTY  The risks benefits and alternatives were discussed with the patient including but not limited to the risks of nonoperative treatment, versus surgical intervention including infection, bleeding, nerve injury,  blood clots, cardiopulmonary complications, morbidity, mortality, among others, and they were willing to proceed.   Marianna Payment, MD   04/08/2016 7:39 AM

## 2016-04-08 NOTE — Anesthesia Procedure Notes (Signed)
Anesthesia Regional Block:  Adductor canal block  Pre-Anesthetic Checklist: ,, timeout performed, Correct Patient, Correct Site, Correct Laterality, Correct Procedure, Correct Position, site marked, Risks and benefits discussed,  Surgical consent,  Pre-op evaluation,  At surgeon's request and post-op pain management  Laterality: Left  Prep: chloraprep       Needles:   Needle Type: Echogenic Needle     Needle Length: 5cm 5 cm Needle Gauge: 22 and 22 G    Additional Needles:  Procedures: ultrasound guided (picture in chart) Adductor canal block Narrative:  Start time: 04/08/2016 7:00 AM End time: 04/08/2016 7:10 AM Injection made incrementally with aspirations every 25 mL.  Performed by: Personally  Anesthesiologist: Reginal Lutes  Additional Notes: Patient tolerated procedure well.

## 2016-04-08 NOTE — Evaluation (Signed)
Physical Therapy Evaluation Patient Details Name: Jim Dodson MRN: [id number] DOB: 11-Oct-1940 Today's Date: 04/08/2016   History of Present Illness  75 y.o. male s/p Lt TKA. PMH: SOB, HTN, prostate cancer, atrial fibrillation.   Clinical Impression  Pt is s/p Lt TKA resulting in the deficits listed below (see PT Problem List). Pt able to ambulate 8 feet during initial PT session. Spouse present and supportive throughout. Pt will benefit from skilled PT to increase their independence and safety with mobility to allow discharge to home with family support.      Follow Up Recommendations Home health PT;Supervision for mobility/OOB    Equipment Recommendations  None recommended by PT (pt reports borrowing rw from a friend)    Recommendations for Other Services       Precautions / Restrictions Precautions Precautions: Knee;Fall Precaution Booklet Issued: Yes (comment) Precaution Comments: HEP provided, reviewed knee extension precautions Restrictions Weight Bearing Restrictions: Yes LLE Weight Bearing: Weight bearing as tolerated      Mobility  Bed Mobility Overal bed mobility: Needs Assistance Bed Mobility: Supine to Sit     Supine to sit: Min guard     General bed mobility comments: HOB elevated, using rail to assist  Transfers Overall transfer level: Needs assistance Equipment used: Rolling [place] (2 wheeled) Transfers: Sit to/from Stand Sit to Stand: Min guard         General transfer comment: reminder for hand placement  Ambulation/Gait Ambulation/Gait assistance: Min guard Ambulation Distance (Feet): 8 Feet Assistive device: Rolling [place] (2 wheeled) Gait Pattern/deviations: Step-to pattern;Decreased weight shift to left Gait velocity: decreased   General Gait Details: cues for sequence  Stairs            Wheelchair Mobility    Modified Rankin (Stroke Patients Only)       Balance Overall balance assessment: Needs  assistance Sitting-balance support: No upper extremity supported Sitting balance-Leahy Scale: Good     Standing balance support: Single extremity supported Standing balance-Leahy Scale: Poor Standing balance comment: using rw                             Pertinent Vitals/Pain Pain Assessment: 0-10 Pain Score: 5  Pain Location: Lt knee Pain Descriptors / Indicators: Aching Pain Intervention(s): Limited activity within patient's tolerance;Monitored during session    Home Living Family/patient expects to be discharged to:: Private residence Living Arrangements: Spouse/significant other Available Help at Discharge: Family;Friend(s);Available 24 hours/day Type of Home: Mobile home Home Access: Stairs to enter Entrance Stairs-Rails: Chemical engineer of Steps: 5 Home Layout: One level Home Equipment: Caliendo - 2 wheels;Shower seat Additional Comments: borrowing rw from a friend, will have it tomorrow.     Prior Function Level of Independence: Independent               Hand Dominance        Extremity/Trunk Assessment   Upper Extremity Assessment: Overall WFL for tasks assessed           Lower Extremity Assessment: LLE deficits/detail   LLE Deficits / Details: able to perform SLR     Communication   Communication: No difficulties  Cognition Arousal/Alertness: Awake/alert Behavior During Therapy: WFL for tasks assessed/performed Overall Cognitive Status: Within Functional Limits for tasks assessed                      General Comments      Exercises Total Joint Exercises Ankle Circles/Pumps:  AROM;Both;10 reps      Assessment/Plan    PT Assessment Patient needs continued PT services  PT Diagnosis Difficulty walking   PT Problem List Decreased strength;Decreased range of motion;Decreased activity tolerance;Decreased balance;Decreased mobility  PT Treatment Interventions DME instruction;Gait training;Stair  training;Functional mobility training;Therapeutic activities;Therapeutic exercise;Patient/family education   PT Goals (Current goals can be found in the Care Plan section) Acute Rehab PT Goals Patient Stated Goal: walk good again PT Goal Formulation: With patient Time For Goal Achievement: 04/22/16 Potential to Achieve Goals: Good    Frequency 7X/week   Barriers to discharge        Co-evaluation               End of Session Equipment Utilized During Treatment: Gait belt Activity Tolerance: Patient tolerated treatment well Patient left: in chair;with call bell/phone within reach;with family/visitor present (in knee extension) Nurse Communication: Mobility status;Weight bearing status         Time: 1510-1554 PT Time Calculation (min) (ACUTE ONLY): 44 min   Charges:   PT Evaluation $PT Eval Moderate Complexity: 1 Procedure PT Treatments $Therapeutic Activity: 23-37 mins   PT G Codes:        Cassell Clement, PT, CSCS Pager [phone] Office [phone]  04/08/2016, 4:04 PM

## 2016-04-08 NOTE — Anesthesia Procedure Notes (Signed)
Spinal  Start time: 04/08/2016 8:50 AM End time: 04/08/2016 8:54 AM Staffing Anesthesiologist: Reginal Lutes Performed: anesthesiologist  Preanesthetic Checklist Completed: patient identified, site marked, surgical consent, pre-op evaluation, timeout performed, IV checked, risks and benefits discussed and monitors and equipment checked Spinal Block Patient position: sitting Prep: ChloraPrep Patient monitoring: heart rate, continuous pulse ox, blood pressure and cardiac monitor Approach: midline Location: L4-5 Injection technique: single-shot Needle Needle type: Quincke  Needle gauge: 22 G

## 2016-04-09 ENCOUNTER — Encounter (HOSPITAL_COMMUNITY): Payer: Self-pay | Admitting: Orthopaedic Surgery

## 2016-04-09 LAB — CBC
HCT: 33.1 % — ABNORMAL LOW (ref 39.0–52.0)
Hemoglobin: 10.2 g/dL — ABNORMAL LOW (ref 13.0–17.0)
MCH: 26.3 pg (ref 26.0–34.0)
MCHC: 30.8 g/dL (ref 30.0–36.0)
MCV: 85.3 fL (ref 78.0–100.0)
Platelets: 159 10*3/uL (ref 150–400)
RBC: 3.88 MIL/uL — ABNORMAL LOW (ref 4.22–5.81)
RDW: 16.3 % — ABNORMAL HIGH (ref 11.5–15.5)
WBC: 7.7 10*3/uL (ref 4.0–10.5)

## 2016-04-09 LAB — BASIC METABOLIC PANEL
Anion gap: 10 (ref 5–15)
BUN: 21 mg/dL — ABNORMAL HIGH (ref 6–20)
CO2: 25 mmol/L (ref 22–32)
Calcium: 8.4 mg/dL — ABNORMAL LOW (ref 8.9–10.3)
Chloride: 102 mmol/L (ref 101–111)
Creatinine, Ser: 1.2 mg/dL (ref 0.61–1.24)
GFR calc Af Amer: >60 mL/min (ref >60)
GFR calc non Af Amer: 58 mL/min — ABNORMAL LOW (ref >60)
Glucose, Bld: 152 mg/dL — ABNORMAL HIGH (ref 65–99)
Potassium: 3.3 mmol/L — ABNORMAL LOW (ref 3.5–5.1)
Sodium: 137 mmol/L (ref 135–145)

## 2016-04-09 NOTE — Progress Notes (Signed)
Physical Therapy Treatment Patient Details Name: Jim Dodson MRN: [id number] DOB: Mar 28, 1941 Today's Date: 04/09/2016    History of Present Illness 75 y.o. male s/p Lt TKA. PMH: SOB, HTN, prostate cancer, atrial fibrillation.     PT Comments    Pt performed stair training with wife present.  Will f/u in am to treat patient before d/c home.    Follow Up Recommendations  Home health PT;Supervision for mobility/OOB     Equipment Recommendations  None recommended by PT (Pt reports borrowing and RW, BSC and tub bench from friends and family.  )    Recommendations for Other Services       Precautions / Restrictions Precautions Precautions: Knee;Fall Precaution Booklet Issued: Yes (comment) Precaution Comments: HEP provided, reviewed knee extension precautions Restrictions Weight Bearing Restrictions: Yes LLE Weight Bearing: Weight bearing as tolerated    Mobility  Bed Mobility               General bed mobility comments: pt in chair  Transfers Overall transfer level: Needs assistance Equipment used: Rolling Shimon (2 wheeled) Transfers: Sit to/from Stand Sit to Stand: Supervision         General transfer comment: good technique  Ambulation/Gait Ambulation/Gait assistance: Supervision Ambulation Distance (Feet): 160 Feet Assistive device: Rolling Chery (2 wheeled) Gait Pattern/deviations: Step-through pattern;Antalgic;Trunk flexed;Decreased stride length Gait velocity: decreased Gait velocity interpretation: Below normal speed for age/gender General Gait Details: Cues for sequencing and forward gaze.     Stairs Stairs: Yes Stairs assistance: Supervision Stair Management: Two rails;Forwards Number of Stairs: 6 General stair comments: Cues for sequencing and hand placement on rails.  Wife present to observe technique for safe entry.    Wheelchair Mobility    Modified Rankin (Stroke Patients Only)       Balance Overall balance assessment: Needs  assistance   Sitting balance-Leahy Scale: Good       Standing balance-Leahy Scale: Fair                      Cognition Arousal/Alertness: Awake/alert Behavior During Therapy: WFL for tasks assessed/performed Overall Cognitive Status: Within Functional Limits for tasks assessed                      Exercises Total Joint Exercises Ankle Circles/Pumps: AROM;Both;10 reps;Supine Quad Sets: AROM;Left;10 reps;Supine Short Arc Quad: AROM;Left;10 reps;Supine Heel Slides: AROM;Left;10 reps;Supine Hip ABduction/ADduction: AROM;Left;10 reps;Supine Goniometric ROM: 88 degrees L knee flexion.      General Comments        Pertinent Vitals/Pain Pain Assessment: 0-10 Pain Score: 5  Pain Location: L knee Pain Descriptors / Indicators: Aching;Grimacing;Guarding Pain Intervention(s): Monitored during session;Repositioned;Ice applied    Home Living                      Prior Function            PT Goals (current goals can now be found in the care plan section) Acute Rehab PT Goals Patient Stated Goal: walk good again Potential to Achieve Goals: Good Progress towards PT goals: Progressing toward goals    Frequency  7X/week    PT Plan Current plan remains appropriate    Co-evaluation             End of Session Equipment Utilized During Treatment: Gait belt Activity Tolerance: Patient tolerated treatment well Patient left: in chair;with call bell/phone within reach;with family/visitor present     Time: [id number] PT Time Calculation (  min) (ACUTE ONLY): 23 min  Charges:  $Gait Training: 8-22 mins $Therapeutic Exercise: 8-22 mins                    G Codes:      Cristela Blue 2016-04-16, 5:06 PM  Governor Rooks, PTA pager [phone]

## 2016-04-09 NOTE — Evaluation (Signed)
Occupational Therapy Evaluation Patient Details Name: Jim Dodson MRN: [id number] DOB: 11/18/1940 Today's Date: 04/09/2016    History of Present Illness 75 y.o. male s/p Lt TKA. PMH: SOB, HTN, prostate cancer, atrial fibrillation.    Clinical Impression   Pt was independent prior to admission. Presents with post operative knee pain, poor balance and generalized weakness interfering with ability to perform ADL and mobility at his baseline. Will follow acutely.    Follow Up Recommendations  No OT follow up    Equipment Recommendations       Recommendations for Other Services       Precautions / Restrictions Precautions Precautions: Knee;Fall Precaution Booklet Issued: Yes (comment) Precaution Comments: HEP provided, reviewed knee extension precautions Restrictions Weight Bearing Restrictions: Yes LLE Weight Bearing: Weight bearing as tolerated      Mobility Bed Mobility      General bed mobility comments: pt in chair  Transfers Overall transfer level: Needs assistance Equipment used: Rolling [place] (2 wheeled) Transfers: Sit to/from Stand Sit to Stand: Min guard         General transfer comment: good technique    Balance Overall balance assessment: Needs assistance   Sitting balance-Leahy Scale: Good       Standing balance-Leahy Scale: Fair                              ADL Overall ADL's : Needs assistance/impaired Eating/Feeding: Independent;Sitting   Grooming: Wash/dry hands;Standing;Min guard   Upper Body Bathing: Set up;Sitting   Lower Body Bathing: Minimal assistance;Sit to/from stand Lower Body Bathing Details (indicate cue type and reason): demonstrated use of long handled bath sponge Upper Body Dressing : Set up;Sitting   Lower Body Dressing: Minimal assistance;Sit to/from stand Lower Body Dressing Details (indicate cue type and reason): practiced use of AE Toilet Transfer: Min Marine scientist Details  (indicate cue type and reason): BSC over toilet Toileting- Clothing Manipulation and Hygiene: Minimal assistance;Sit to/from stand       Functional mobility during ADLs: Min guard;Rolling Dougal General ADL Comments: Educated pt and wife in multiple uses of 3 in 1.     Vision     Perception     Praxis      Pertinent Vitals/Pain Pain Assessment: Faces Pain Score: 5  Faces Pain Scale: Hurts even more Pain Location: L knee Pain Descriptors / Indicators: Aching;Grimacing;Guarding Pain Intervention(s): RN gave pain meds during session;Repositioned;Ice applied     Hand Dominance Right   Extremity/Trunk Assessment Upper Extremity Assessment Upper Extremity Assessment: Overall WFL for tasks assessed   Lower Extremity Assessment Lower Extremity Assessment: Defer to PT evaluation   Cervical / Trunk Assessment Cervical / Trunk Assessment: Other exceptions Cervical / Trunk Exceptions: lateral neck flexion to L   Communication Communication Communication: No difficulties   Cognition Arousal/Alertness: Awake/alert Behavior During Therapy: WFL for tasks assessed/performed Overall Cognitive Status: Within Functional Limits for tasks assessed                     General Comments       Exercises       Shoulder Instructions      Home Living Family/patient expects to be discharged to:: Private residence Living Arrangements: Spouse/significant other Available Help at Discharge: Family;Friend(s);Available 24 hours/day Type of Home: Mobile home Home Access: Stairs to enter Entrance Stairs-Number of Steps: 5 Entrance Stairs-Rails: Left;Right Home Layout: One level     Bathroom Shower/Tub: Tub/shower  unit   Bathroom Toilet: Standard     Home Equipment: Environmental consultant - 2 wheels (wife unsure if the friend has a 3 in 1 or a shower seat)   Additional Comments: borrowing equipment from a friend      Prior Functioning/Environment Level of Independence: Independent              OT Diagnosis: Generalized weakness;Acute pain   OT Problem List: Decreased strength;Decreased activity tolerance;Impaired balance (sitting and/or standing);Decreased knowledge of use of DME or AE;Pain   OT Treatment/Interventions: Self-care/ADL training;DME and/or AE instruction;Patient/family education;Balance training;Therapeutic activities    OT Goals(Current goals can be found in the care plan section) Acute Rehab OT Goals Patient Stated Goal: walk good again OT Goal Formulation: With patient Time For Goal Achievement: 04/16/16 Potential to Achieve Goals: Good ADL Goals Pt Will Perform Grooming: with supervision;standing Pt Will Transfer to Toilet: with supervision;ambulating;bedside commode (over toilet) Pt Will Perform Toileting - Clothing Manipulation and hygiene: with supervision;sit to/from stand Pt Will Perform Tub/Shower Transfer: Tub transfer;with min assist;ambulating;3 in 1;rolling [place] Additional ADL Goal #1: Pt and wife will be aware of compensatory strategies for LB ADL including AE.  OT Frequency: Min 2X/week   Barriers to D/C:            Co-evaluation              End of Session Equipment Utilized During Treatment: Gait belt;Rolling [place]  Activity Tolerance: Patient tolerated treatment well Patient left: with family/visitor present (on toilet)   Time: [id number] OT Time Calculation (min): 23 min Charges:  OT General Charges $OT Visit: 1 Procedure OT Evaluation $OT Eval Moderate Complexity: 1 Procedure OT Treatments $Self Care/Home Management : 8-22 mins G-Codes:    Jim Dodson 04/09/2016, 11:21 AM  [phone]

## 2016-04-09 NOTE — Progress Notes (Signed)
   Subjective:  Patient reports pain as mild.  Had a good night.  Objective:   VITALS:   Vitals:   04/08/16 1337 04/08/16 2100 04/09/16 0045 04/09/16 0646  BP: (!) 153/105 129/82 134/65 (!) 143/88  Pulse: 63 65 88 (!) 111  Resp: 16 16 16 16   Temp: 97.8 F (36.6 C) 98.2 F (36.8 C) 98.4 F (36.9 C) 98.3 F (36.8 C)  TempSrc: Axillary Oral Oral Oral  SpO2: 100% 99% 98% 97%  Weight:      Height: 5\' 4"  (1.626 m)       Neurologically intact Neurovascular intact Sensation intact distally Intact pulses distally Dorsiflexion/Plantar flexion intact Incision: dressing C/D/I and no drainage No cellulitis present Compartment soft   Lab Results  Component Value Date   WBC 7.7 04/09/2016   HGB 10.2 (L) 04/09/2016   HCT 33.1 (L) 04/09/2016   MCV 85.3 04/09/2016   PLT 159 04/09/2016     Assessment/Plan:  1 Day Post-Op   - Expected postop acute blood loss anemia - will monitor for symptoms - Up with PT/OT - DVT ppx - SCDs, ambulation, eliquis - WBAT operative extremity - Pain control - Discharge planning - possibly this afternoon if he does well with PT and pain is controlled  Marianna Payment 04/09/2016, 7:36 AM [phone]

## 2016-04-09 NOTE — Progress Notes (Signed)
Physical Therapy Treatment Patient Details Name: Jim Dodson MRN: [id number] DOB: 07/04/1941 Today's Date: 04/09/2016    History of Present Illness 75 y.o. male s/p Lt TKA. PMH: SOB, HTN, prostate cancer, atrial fibrillation.     PT Comments    Pt performed increased gait and reviewed supine therapeutic exercise in prep for d/c home.  Pt will perform stair training this pm.    Follow Up Recommendations  Home health PT;Supervision for mobility/OOB     Equipment Recommendations  None recommended by PT (Pt reports borrowing a RW from a friend.  )    Recommendations for Other Services       Precautions / Restrictions Precautions Precautions: Knee;Fall Precaution Booklet Issued: Yes (comment) Precaution Comments: HEP provided, reviewed knee extension precautions Restrictions Weight Bearing Restrictions: Yes LLE Weight Bearing: Weight bearing as tolerated    Mobility  Bed Mobility Overal bed mobility: Needs Assistance Bed Mobility: Supine to Sit     Supine to sit: Supervision     General bed mobility comments: HOB elevated with use of rail.    Transfers Overall transfer level: Needs assistance Equipment used: Rolling [place] (2 wheeled) Transfers: Sit to/from Stand Sit to Stand: Min guard         General transfer comment: Cues for hand placement, pt attempting to pull on RW.  Cues for advancement of LLE forward.    Ambulation/Gait Ambulation/Gait assistance: Min guard Ambulation Distance (Feet): 140 Feet Assistive device: Rolling [place] (2 wheeled) Gait Pattern/deviations: Step-to pattern;Step-through pattern;Antalgic;Trunk flexed;Decreased stride length Gait velocity: decreased Gait velocity interpretation: Below normal speed for age/gender General Gait Details: Cues for sequencing, forward gaze, upper trunk control and gait symmetry.     Stairs            Wheelchair Mobility    Modified Rankin (Stroke Patients Only)       Balance Overall  balance assessment: Needs assistance   Sitting balance-Leahy Scale: Good       Standing balance-Leahy Scale: Fair                      Cognition Arousal/Alertness: Awake/alert Behavior During Therapy: WFL for tasks assessed/performed Overall Cognitive Status: Within Functional Limits for tasks assessed                      Exercises Total Joint Exercises Ankle Circles/Pumps: AROM;Both;10 reps;Supine Quad Sets: AROM;Left;10 reps;Supine Short Arc Quad: AROM;Left;10 reps;Supine Heel Slides: AROM;Left;10 reps;Supine Hip ABduction/ADduction: AROM;Left;10 reps;Supine Straight Leg Raises: AROM;Left;10 reps;Supine    General Comments        Pertinent Vitals/Pain Pain Assessment: 0-10 Pain Score: 5  Pain Descriptors / Indicators: Aching Pain Intervention(s): Limited activity within patient's tolerance    Home Living                      Prior Function            PT Goals (current goals can now be found in the care plan section) Acute Rehab PT Goals Patient Stated Goal: walk good again Potential to Achieve Goals: Good Progress towards PT goals: Progressing toward goals    Frequency       PT Plan Current plan remains appropriate    Co-evaluation             End of Session Equipment Utilized During Treatment: Gait belt Activity Tolerance: Patient tolerated treatment well Patient left: in chair;with call bell/phone within reach;with family/visitor present (L heel elevated  to promote knee extension.  )     Time: [id number] PT Time Calculation (min) (ACUTE ONLY): 25 min  Charges:  $Gait Training: 8-22 mins $Therapeutic Exercise: 8-22 mins                    G Codes:      Cristela Blue 04/14/2016, 10:39 AM  Governor Rooks, PTA pager [phone]

## 2016-04-10 NOTE — Progress Notes (Signed)
Pt stable for d/c home today per Dr. Erlinda Hong. Pt met PT/OT goals, has equipment at home. Discharge instructions and prescriptions reviewed with pt and wife, all questions answered. Belongings gathered and sent with wife. Pt will be assisted to car by wheelchair.   [place], Jerry Caras

## 2016-04-10 NOTE — Care Management Note (Signed)
Case Management Note  Patient Details  Name: Jim Dodson MRN: [id number] Date of Birth: 07-Jul-1941  Subjective/Objective:  75 yr old gentleman, s/p left total knee arthroplasty.  Action/Plan: Case manager spoke with patient and wife concerning home health and DME needs. Patient states he has rolling Paster and shower stool, will be borrowing a 3in1. Patient was preoperatively setup with [facility], no changes.     Expected Discharge Date:   04/10/16               Expected Discharge Plan:  [place]  In-House Referral:  NA  Discharge planning Services  CM Consult  Post Acute Care Choice:  Home Health Choice offered to:  Patient  DME Arranged:    DME Agency:     HH Arranged:  PT [place]:  [facility] (now [facility])  Status of Service:  Completed, signed off  If discussed at [employer] of Stay Meetings, dates discussed:    Additional Comments:  Ninfa Meeker, RN 04/10/2016, 10:10 AM

## 2016-04-10 NOTE — Progress Notes (Signed)
Occupational Therapy Treatment Patient Details Name: Jim Dodson MRN: [id number] DOB: [birth year] Today's Date: 04/10/2016    History of present illness 75 y.o. male s/p Lt TKA. PMH: SOB, HTN, prostate cancer, atrial fibrillation.    OT comments  Pt and wife able to verbalize compensatory strategies and are knowledgeable in availability of AE for LB bathing and dressing. Wife will assist pt. Pt performing toileting and standing grooming at a supervision level. Pt to practice tub transfer with HHPT as he has a garden tub with a step which cannot be simulated in the hospital.   Follow Up Recommendations  No OT follow up    Equipment Recommendations  None recommended by OT    Recommendations for Other Services      Precautions / Restrictions Precautions Precautions: Knee;Fall Restrictions Weight Bearing Restrictions: Yes LLE Weight Bearing: Weight bearing as tolerated       Mobility Bed Mobility               General bed mobility comments: pt in chair  Transfers Overall transfer level: Needs assistance Equipment used: Rolling [place] (2 wheeled) Transfers: Sit to/from Stand Sit to Stand: Supervision         General transfer comment: good technique    Balance     Sitting balance-Leahy Scale: Good       Standing balance-Leahy Scale: Fair                     ADL Overall ADL's : Needs assistance/impaired     Grooming: Wash/dry hands;Standing;Supervision/safety                 Lower Body Dressing Details (indicate cue type and reason): educated to dress L LE first and then R, pt and wife aware of AE, but wife will assist, educated in availability of elastic shoe laces  Toilet Transfer: Supervision/safety;Ambulation;BSC;RW   Toileting- Clothing Manipulation and Hygiene: Supervision/safety;Sit to/from Nurse, children's Details (indicate cue type and reason): recommended pt practice tub transfer with HHPT, has a garden tub at  home Functional mobility during ADLs: Supervision/safety;Rolling [place]el[place] General ADL Comments: Educated wife in how to size/adjust Carden height.      Vision                     Perception     Praxis      Cognition   Behavior During Therapy: WFL for tasks assessed/performed Overall Cognitive Status: Within Functional Limits for tasks assessed                       Extremity/Trunk Assessment               Exercises     Shoulder Instructions       General Comments      Pertinent Vitals/ Pain       Pain Assessment: Faces Faces Pain Scale: Hurts little more Pain Location: L knee Pain Descriptors / Indicators: Sore;Guarding;Grimacing  Home Living                                          Prior Functioning/Environment              Frequency       Progress Toward Goals  OT Goals(current goals can now be found in the care plan section)  Progress towards OT goals: Progressing toward goals  Acute Rehab OT Goals Patient Stated Goal: walk good again  Plan Discharge plan remains appropriate    Co-evaluation                 End of Session Equipment Utilized During Treatment: Gait belt;Rolling Pant   Activity Tolerance Patient tolerated treatment well   Patient Left in chair;with family/visitor present   Nurse Communication          Time: 1040-1057 OT Time Calculation (min): 17 min  Charges: OT General Charges $OT Visit: 1 Procedure OT Treatments $Self Care/Home Management : 8-22 mins  Malka So 04/10/2016, 11:04 AM  [phone]

## 2016-04-10 NOTE — Progress Notes (Signed)
Physical Therapy Treatment Patient Details Name: Jim Dodson MRN: [id number] DOB: 09-Oct-1940 Today's Date: 04/10/2016    History of Present Illness 75 y.o. male s/p Lt TKA. PMH: SOB, HTN, prostate cancer, atrial fibrillation.     PT Comments    Pt performed gait and reviewed HEP in prep for d/c home.  Pt able to verbalize technique for stair training from yesterdays session.  Pt ready to d/c from a mobility stand point.    Follow Up Recommendations  Home health PT;Supervision for mobility/OOB     Equipment Recommendations  None recommended by PT (Pt reports borrowing all equipment from a friend/family members.  )    Recommendations for Other Services       Precautions / Restrictions Precautions Precautions: Knee;Fall Precaution Booklet Issued: Yes (comment) Precaution Comments: HEP provided, reviewed knee extension precautions Restrictions Weight Bearing Restrictions: Yes LLE Weight Bearing: Weight bearing as tolerated    Mobility  Bed Mobility               General bed mobility comments: pt in chair  Transfers Overall transfer level: Needs assistance Equipment used: Rolling [place] (2 wheeled) Transfers: Sit to/from Stand Sit to Stand: Modified independent (Device/Increase time)         General transfer comment: good technique  Ambulation/Gait Ambulation/Gait assistance: Supervision Ambulation Distance (Feet): 180 Feet Assistive device: Rolling [place]or[place] (2 wheeled) Gait Pattern/deviations: Step-through pattern;Trunk flexed Gait velocity: decreased   General Gait Details: Lateral lean to the L in cervical spine, Cues for upright posture and Rw safety   Stairs Stairs: Yes (reviewed HEP and stairs for d/c home.  Pt verbalized technique.  )          Wheelchair Mobility    Modified Rankin (Stroke Patients Only)       Balance Overall balance assessment: Needs assistance   Sitting balance-Leahy Scale: Good       Standing balance-Leahy  Scale: Fair                      Cognition Arousal/Alertness: Awake/alert Behavior During Therapy: WFL for tasks assessed/performed Overall Cognitive Status: Within Functional Limits for tasks assessed                      Exercises Total Joint Exercises Ankle Circles/Pumps: AROM;Both;10 reps;Supine Quad Sets: AROM;Left;10 reps;Supine Towel Squeeze: AROM;Both;10 reps;Supine Short Arc Quad: AROM;Left;10 reps;Supine Heel Slides: AROM;Left;10 reps;Supine Hip ABduction/ADduction: AROM;Left;10 reps;Supine Straight Leg Raises: AROM;Left;10 reps;Supine Goniometric ROM: 96 degrees L knee flexion.      General Comments        Pertinent Vitals/Pain Pain Assessment: 0-10 Pain Score: 5  Faces Pain Scale: Hurts little more Pain Location: Lknee with mobility Pain Descriptors / Indicators: Sore;Grimacing;Guarding Pain Intervention(s): Monitored during session;Repositioned    Home Living                      Prior Function            PT Goals (current goals can now be found in the care plan section) Acute Rehab PT Goals Patient Stated Goal: walk good again Potential to Achieve Goals: Good Progress towards PT goals: Progressing toward goals    Frequency  7X/week    PT Plan Current plan remains appropriate    Co-evaluation             End of Session Equipment Utilized During Treatment: Gait belt Activity Tolerance: Patient tolerated treatment well Patient left: in  chair;with call bell/phone within reach;with family/visitor present     Time: 1009-1027 PT Time Calculation (min) (ACUTE ONLY): 18 min  Charges:  $Gait Training: 8-22 mins                    G Codes:      Cristela Blue Apr 19, 2016, 11:19 AM  Governor Rooks, PTA pager [phone]

## 2016-04-10 NOTE — Discharge Summary (Signed)
Physician Discharge Summary      Patient ID: Jim Dodson MRN: [id number] DOB/AGE: [birth year] 75 y.o.  Admit date: 04/08/2016 Discharge date: 04/10/2016  Admission Diagnoses:  <principal problem not specified>  Discharge Diagnoses:  Active Problems:   Total knee replacement status   Past Medical History:  Diagnosis Date  . Atrial fibrillation ([place])   . Coronary artery disease    stent placed 2009  . Headache   . History of hiatal hernia   . Hypertension   . Prostate cancer [facility])    prostate cancer, had sx  . Shortness of breath dyspnea     Surgeries: Procedure(s): LEFT TOTAL KNEE ARTHROPLASTY on 04/08/2016   Consultants (if any):   Discharged Condition: Improved  Hospital Course: Jim Dodson is an 75 y.o. male who was admitted 04/08/2016 with a diagnosis of <principal problem not specified> and went to the operating room on 04/08/2016 and underwent the above named procedures.    He was given perioperative antibiotics:  Anti-infectives    Start     Dose/Rate Route Frequency Ordered Stop   04/08/16 1415  ceFAZolin (ANCEF) IVPB 2g/100 mL premix     2 g 200 mL/hr over 30 Minutes Intravenous Every 6 hours 04/08/16 1336 04/08/16 2117   04/08/16 0800  ceFAZolin (ANCEF) IVPB 2g/100 mL premix     2 g 200 mL/hr over 30 Minutes Intravenous To [facility] 04/07/16 1358 04/08/16 0830    .  He was given sequential compression devices, early ambulation, and eliquis for DVT prophylaxis.  He benefited maximally from the hospital stay and there were no complications.    Recent vital signs:  Vitals:   04/09/16 2015 04/10/16 0522  BP: (!) 156/99 134/76  Pulse: (!) 108 70  Resp: 20 18  Temp: 98.4 F (36.9 C) 98.4 F (36.9 C)    Recent laboratory studies:  Lab Results  Component Value Date   HGB 10.2 (L) 04/09/2016   HGB 12.1 (L) 03/31/2016   HGB 9.9 (L) 07/27/2015   Lab Results  Component Value Date   WBC 7.7 04/09/2016   PLT 159 04/09/2016   Lab Results   Component Value Date   INR 1.13 04/08/2016   Lab Results  Component Value Date   NA 137 04/09/2016   K 3.3 (L) 04/09/2016   CL 102 04/09/2016   CO2 25 04/09/2016   BUN 21 (H) 04/09/2016   CREATININE 1.20 04/09/2016   GLUCOSE 152 (H) 04/09/2016    Discharge Medications:     Medication List    TAKE these medications   acetaminophen 500 MG tablet Commonly known as:  TYLENOL Take 1,000 mg by mouth 2 (two) times daily.   ANTI-DIARRHEAL 2 MG capsule Generic drug:  loperamide Take 2 mg by mouth every morning.   ELIQUIS 5 MG Tabs tablet Generic drug:  apixaban Take 5 mg by mouth 2 (two) times daily.   HYALURONIC ACID PO Take 100 mg elemental calcium/kg/hr by mouth daily.   hydrALAZINE 50 MG tablet Commonly known as:  APRESOLINE Take 1 tablet (50 mg total) by mouth every 8 (eight) hours.   hydrochlorothiazide 25 MG tablet Commonly known as:  HYDRODIURIL Take 25 mg by mouth daily.   lisinopril 20 MG tablet Commonly known as:  PRINIVIL,ZESTRIL Take 20 mg by mouth daily.   MAGNESIUM-POTASSIUM PO Take 1 tablet by mouth 2 (two) times daily.   meclizine 12.5 MG tablet Commonly known as:  ANTIVERT Take 12.5 mg by mouth 3 (three) times  daily as needed for dizziness.   methocarbamol 750 MG tablet Commonly known as:  ROBAXIN Take 1 tablet (750 mg total) by mouth 2 (two) times daily as needed for muscle spasms.   metoprolol 100 MG tablet Commonly known as:  LOPRESSOR Take 1 tablet (100 mg total) by mouth 2 (two) times daily.   NUTRITIONAL SUPPLEMENT PO Take 2 tablets by mouth 4 (four) times daily. [place]: 40% ketosterones/ 20% 3-ketosterone   omeprazole 20 MG capsule Commonly known as:  PRILOSEC Take 20 mg by mouth daily.   ondansetron 4 MG tablet Commonly known as:  ZOFRAN Take 1-2 tablets (4-8 mg total) by mouth every 8 (eight) hours as needed for nausea or vomiting.   oxyCODONE 5 MG immediate release tablet Commonly known as:  Oxy IR/ROXICODONE Take 1-3  tablets (5-15 mg total) by mouth every 4 (four) hours as needed.   oxyCODONE 10 mg 12 hr tablet Commonly known as:  OXYCONTIN Take 1 tablet (10 mg total) by mouth every 12 (twelve) hours.   Red Yeast Rice Extract 600 MG Caps Take 1,200 mg by mouth 2 (two) times daily.   senna-docusate 8.6-50 MG tablet Commonly known as:  SENOKOT S Take 1 tablet by mouth at bedtime as needed.   Vitamin D3 5000 units Tabs Take 1 tablet by mouth daily.       Diagnostic Studies: Dg Knee Left Port  Result Date: 04/08/2016 CLINICAL DATA:  Postop total knee arthroplasty. EXAM: PORTABLE LEFT KNEE - 1-2 VIEW COMPARISON:  None. FINDINGS: The femoral, tibial and patellar components are well seated. No complicating features are demonstrated. IMPRESSION: Well seated components of a total left knee arthroplasty. Electronically Signed   By: Marijo Sanes M.D.   On: 04/08/2016 11:34    Disposition: 01-Home or Self Care  Discharge Instructions    Call MD / Call 911    Complete by:  As directed   If you experience chest pain or shortness of breath, CALL 911 and be transported to the hospital emergency room.  If you develope a fever above 101.5 F, pus (white drainage) or increased drainage or redness at the wound, or calf pain, call your surgeon's office.   Constipation Prevention    Complete by:  As directed   Drink plenty of fluids.  Prune juice may be helpful.  You may use a stool softener, such as Colace (over the counter) 100 mg twice a day.  Use MiraLax (over the counter) for constipation as needed.   Diet - low sodium heart healthy    Complete by:  As directed   Diet general    Complete by:  As directed   Driving restrictions    Complete by:  As directed   No driving while taking narcotic pain meds.   Increase activity slowly as tolerated    Complete by:  As directed      Follow-up Information    Marianna Payment, MD Follow up in 2 week(s).   Specialty:  Orthopedic Surgery Why:  For suture removal,  For wound re-check Contact information: [address] [place] [address] [phone]            Signed: Marianna Payment 04/10/2016, 7:19 AM

## 2016-04-10 NOTE — Progress Notes (Signed)
   Subjective:  Patient reports pain as mild.  A little confused last night  Objective:   VITALS:   Vitals:   04/09/16 0646 04/09/16 1355 04/09/16 2015 04/10/16 0522  BP: (!) 143/88 (!) 163/92 (!) 156/99 134/76  Pulse: (!) 111 71 (!) 108 70  Resp: 16 20 20 18   Temp: 98.3 F (36.8 C) 98.4 F (36.9 C) 98.4 F (36.9 C) 98.4 F (36.9 C)  TempSrc: Oral Oral Oral Oral  SpO2: 97% 97% 97% 100%  Weight:      Height:        Neurologically intact Neurovascular intact Sensation intact distally Intact pulses distally Dorsiflexion/Plantar flexion intact Incision: dressing C/D/I and no drainage No cellulitis present Compartment soft   Lab Results  Component Value Date   WBC 7.7 04/09/2016   HGB 10.2 (L) 04/09/2016   HCT 33.1 (L) 04/09/2016   MCV 85.3 04/09/2016   PLT 159 04/09/2016     Assessment/Plan:  2 Days Post-Op   - stable for dc home this morning  Marianna Payment 04/10/2016, 7:18 AM [phone]

## 2016-04-11 DIAGNOSIS — I2510 Atherosclerotic heart disease of native coronary artery without angina pectoris: Secondary | ICD-10-CM | POA: Diagnosis not present

## 2016-04-11 DIAGNOSIS — Z79891 Long term (current) use of opiate analgesic: Secondary | ICD-10-CM | POA: Diagnosis not present

## 2016-04-11 DIAGNOSIS — I10 Essential (primary) hypertension: Secondary | ICD-10-CM | POA: Diagnosis not present

## 2016-04-11 DIAGNOSIS — Z471 Aftercare following joint replacement surgery: Secondary | ICD-10-CM | POA: Diagnosis not present

## 2016-04-11 DIAGNOSIS — I4891 Unspecified atrial fibrillation: Secondary | ICD-10-CM | POA: Diagnosis not present

## 2016-04-11 DIAGNOSIS — Z96652 Presence of left artificial knee joint: Secondary | ICD-10-CM | POA: Diagnosis not present

## 2016-04-11 DIAGNOSIS — Z7901 Long term (current) use of anticoagulants: Secondary | ICD-10-CM | POA: Diagnosis not present

## 2016-04-20 DIAGNOSIS — Z7901 Long term (current) use of anticoagulants: Secondary | ICD-10-CM | POA: Diagnosis not present

## 2016-04-20 DIAGNOSIS — Z79891 Long term (current) use of opiate analgesic: Secondary | ICD-10-CM | POA: Diagnosis not present

## 2016-04-20 DIAGNOSIS — I4891 Unspecified atrial fibrillation: Secondary | ICD-10-CM | POA: Diagnosis not present

## 2016-04-20 DIAGNOSIS — Z471 Aftercare following joint replacement surgery: Secondary | ICD-10-CM | POA: Diagnosis not present

## 2016-04-20 DIAGNOSIS — Z96652 Presence of left artificial knee joint: Secondary | ICD-10-CM | POA: Diagnosis not present

## 2016-04-20 DIAGNOSIS — I2510 Atherosclerotic heart disease of native coronary artery without angina pectoris: Secondary | ICD-10-CM | POA: Diagnosis not present

## 2016-04-20 DIAGNOSIS — I10 Essential (primary) hypertension: Secondary | ICD-10-CM | POA: Diagnosis not present

## 2016-04-27 DIAGNOSIS — M25562 Pain in left knee: Secondary | ICD-10-CM | POA: Diagnosis not present

## 2016-04-27 DIAGNOSIS — M25662 Stiffness of left knee, not elsewhere classified: Secondary | ICD-10-CM | POA: Diagnosis not present

## 2016-04-27 DIAGNOSIS — M6281 Muscle weakness (generalized): Secondary | ICD-10-CM | POA: Diagnosis not present

## 2016-04-27 DIAGNOSIS — G8929 Other chronic pain: Secondary | ICD-10-CM | POA: Diagnosis not present

## 2016-04-27 DIAGNOSIS — Z96652 Presence of left artificial knee joint: Secondary | ICD-10-CM | POA: Diagnosis not present

## 2016-04-30 DIAGNOSIS — Z96652 Presence of left artificial knee joint: Secondary | ICD-10-CM | POA: Diagnosis not present

## 2016-05-06 DIAGNOSIS — M6281 Muscle weakness (generalized): Secondary | ICD-10-CM | POA: Diagnosis not present

## 2016-05-06 DIAGNOSIS — Z96652 Presence of left artificial knee joint: Secondary | ICD-10-CM | POA: Diagnosis not present

## 2016-05-08 DIAGNOSIS — M6281 Muscle weakness (generalized): Secondary | ICD-10-CM | POA: Diagnosis not present

## 2016-05-08 DIAGNOSIS — Z96652 Presence of left artificial knee joint: Secondary | ICD-10-CM | POA: Diagnosis not present

## 2016-05-08 DIAGNOSIS — M25662 Stiffness of left knee, not elsewhere classified: Secondary | ICD-10-CM | POA: Diagnosis not present

## 2016-05-13 DIAGNOSIS — M6281 Muscle weakness (generalized): Secondary | ICD-10-CM | POA: Diagnosis not present

## 2016-05-13 DIAGNOSIS — Z96652 Presence of left artificial knee joint: Secondary | ICD-10-CM | POA: Diagnosis not present

## 2016-05-13 DIAGNOSIS — M25662 Stiffness of left knee, not elsewhere classified: Secondary | ICD-10-CM | POA: Diagnosis not present

## 2016-05-15 DIAGNOSIS — G8929 Other chronic pain: Secondary | ICD-10-CM | POA: Diagnosis not present

## 2016-05-15 DIAGNOSIS — M6281 Muscle weakness (generalized): Secondary | ICD-10-CM | POA: Diagnosis not present

## 2016-05-15 DIAGNOSIS — Z96652 Presence of left artificial knee joint: Secondary | ICD-10-CM | POA: Diagnosis not present

## 2016-05-15 DIAGNOSIS — M25562 Pain in left knee: Secondary | ICD-10-CM | POA: Diagnosis not present

## 2016-05-15 DIAGNOSIS — M25662 Stiffness of left knee, not elsewhere classified: Secondary | ICD-10-CM | POA: Diagnosis not present

## 2016-05-20 DIAGNOSIS — Z96652 Presence of left artificial knee joint: Secondary | ICD-10-CM | POA: Diagnosis not present

## 2016-05-20 DIAGNOSIS — M6281 Muscle weakness (generalized): Secondary | ICD-10-CM | POA: Diagnosis not present

## 2016-05-20 DIAGNOSIS — M25662 Stiffness of left knee, not elsewhere classified: Secondary | ICD-10-CM | POA: Diagnosis not present

## 2016-05-21 ENCOUNTER — Ambulatory Visit (INDEPENDENT_AMBULATORY_CARE_PROVIDER_SITE_OTHER): Payer: PPO | Admitting: Orthopaedic Surgery

## 2016-05-21 DIAGNOSIS — M1712 Unilateral primary osteoarthritis, left knee: Secondary | ICD-10-CM | POA: Diagnosis not present

## 2016-05-22 DIAGNOSIS — M6281 Muscle weakness (generalized): Secondary | ICD-10-CM | POA: Diagnosis not present

## 2016-05-22 DIAGNOSIS — M25662 Stiffness of left knee, not elsewhere classified: Secondary | ICD-10-CM | POA: Diagnosis not present

## 2016-05-22 DIAGNOSIS — Z6833 Body mass index (BMI) 33.0-33.9, adult: Secondary | ICD-10-CM | POA: Diagnosis not present

## 2016-05-22 DIAGNOSIS — I480 Paroxysmal atrial fibrillation: Secondary | ICD-10-CM | POA: Diagnosis not present

## 2016-05-22 DIAGNOSIS — R946 Abnormal results of thyroid function studies: Secondary | ICD-10-CM | POA: Diagnosis not present

## 2016-05-22 DIAGNOSIS — R413 Other amnesia: Secondary | ICD-10-CM | POA: Diagnosis not present

## 2016-05-22 DIAGNOSIS — I77810 Thoracic aortic ectasia: Secondary | ICD-10-CM | POA: Diagnosis not present

## 2016-05-22 DIAGNOSIS — M25562 Pain in left knee: Secondary | ICD-10-CM | POA: Diagnosis not present

## 2016-05-22 DIAGNOSIS — Z23 Encounter for immunization: Secondary | ICD-10-CM | POA: Diagnosis not present

## 2016-05-22 DIAGNOSIS — D6489 Other specified anemias: Secondary | ICD-10-CM | POA: Diagnosis not present

## 2016-05-22 DIAGNOSIS — Z96652 Presence of left artificial knee joint: Secondary | ICD-10-CM | POA: Diagnosis not present

## 2016-05-22 DIAGNOSIS — I110 Hypertensive heart disease with heart failure: Secondary | ICD-10-CM | POA: Diagnosis not present

## 2016-05-22 DIAGNOSIS — L308 Other specified dermatitis: Secondary | ICD-10-CM | POA: Diagnosis not present

## 2016-05-22 DIAGNOSIS — R7309 Other abnormal glucose: Secondary | ICD-10-CM | POA: Diagnosis not present

## 2016-07-02 ENCOUNTER — Encounter (INDEPENDENT_AMBULATORY_CARE_PROVIDER_SITE_OTHER): Payer: Self-pay | Admitting: Orthopaedic Surgery

## 2016-07-02 ENCOUNTER — Ambulatory Visit (INDEPENDENT_AMBULATORY_CARE_PROVIDER_SITE_OTHER): Payer: PPO | Admitting: Orthopaedic Surgery

## 2016-07-02 DIAGNOSIS — Z96652 Presence of left artificial knee joint: Secondary | ICD-10-CM

## 2016-07-02 NOTE — Progress Notes (Signed)
Office Visit Note   Patient: Jim Dodson           Date of Birth: [birth year]           MRN: [id number] Visit Date: 07/02/2016              Requested by: Shon Baton, MD [place], [place] [address] PCP: Precious Reel, MD   Assessment & Plan: Visit Diagnoses: No diagnosis found.  Plan: 3 month TKA follow up plan  Patient now 3 months status post total knee arthroplasty. Wound is healed with no signs of complications or infection.  The patient does not complain of pain, and is back to normal daily activities. It was reinforced that prophylactic antibiotics should be taken with any procedure including but not limited to dental work or colonoscopies.  We will plan on following up at the 12 month postop visit with radiographs at that time. As always, instructions were given to call with any questions or concerns in the interim.   Follow-Up Instructions: Return in about 9 months (around 04/01/2017).   Orders:  No orders of the defined types were placed in this encounter.  No orders of the defined types were placed in this encounter.     Procedures: No procedures performed   Clinical Data: No additional findings.   Subjective: Chief Complaint  Patient presents with  . Left Knee - Pain    HPI Patient is here for 3 mo f/u postop visit for left TKA.  Doing well.  No real complaints. Review of Systems   Objective: Vital Signs: There were no vitals taken for this visit.  Physical Exam  Ortho Exam Well healed scar.  Excellent ROM Specialty Comments:  No specialty comments available.  Imaging: No results found.   PMFS History: Patient Active Problem List   Diagnosis Date Noted  . Total knee replacement status 04/08/2016  . Sepsis ([place]) 07/25/2015  . On continuous oral anticoagulation 07/25/2015  . Essential hypertension 07/25/2015  . GERD (gastroesophageal reflux disease) 07/25/2015  . Acute upper respiratory infection 07/25/2015  . Incarcerated  ventral hernia 03/14/2015  . Atrial fibrillation with rapid ventricular response ([place])   . Chronic coronary artery disease   . Elevated troponin   . Patient receiving intravenous heparin therapy   . SBO (small bowel obstruction) 03/08/2015   Past Medical History:  Diagnosis Date  . Atrial fibrillation ([place])   . Coronary artery disease    stent placed 2009  . Headache   . History of hiatal hernia   . Hypertension   . Prostate cancer [facility])    prostate cancer, had sx  . Shortness of breath dyspnea     No family history on file.  Past Surgical History:  Procedure Laterality Date  . CARDIAC CATHETERIZATION     2009, stent placed  . CARDIOVERSION N/A 03/13/2015   Procedure: CARDIOVERSION;  Surgeon: Jerline Pain, MD;  Location: [place];  Service: Cardiovascular;  Laterality: N/A;  . COLONOSCOPY WITH PROPOFOL N/A 07/19/2015   Procedure: COLONOSCOPY WITH PROPOFOL;  Surgeon: Manya Silvas, MD;  Location: [facility] ENDOSCOPY;  Service: Endoscopy;  Laterality: N/A;  . cryo surgery of prostate    . ESOPHAGOGASTRODUODENOSCOPY (EGD) WITH PROPOFOL N/A 07/19/2015   Procedure: ESOPHAGOGASTRODUODENOSCOPY (EGD) WITH PROPOFOL;  Surgeon: Manya Silvas, MD;  Location: [facility] ENDOSCOPY;  Service: Endoscopy;  Laterality: N/A;  . LAPAROTOMY N/A 03/08/2015   Procedure: EXPLORATORY LAPAROTOMY;  Surgeon: Autumn Messing III, MD;  Location: [place];  Service: General;  Laterality: N/A;  . LAPAROTOMY N/A 03/09/2015   Procedure: EXPLORATORY LAPAROTOMY AND EVACUATION OF HEMATOMA;  Surgeon: Autumn Messing III, MD;  Location: [place];  Service: General;  Laterality: N/A;  . LYSIS OF ADHESION N/A 03/08/2015   Procedure: LYSIS OF ADHESION;  Surgeon: Autumn Messing III, MD;  Location: [place];  Service: General;  Laterality: N/A;  . OMENTECTOMY N/A 03/08/2015   Procedure: OMENTECTOMY;  Surgeon: Autumn Messing III, MD;  Location: [place];  Service: General;  Laterality: N/A;  . TEE WITHOUT CARDIOVERSION N/A 03/13/2015   Procedure: TRANSESOPHAGEAL  ECHOCARDIOGRAM (TEE);  Surgeon: Jerline Pain, MD;  Location: [place];  Service: Cardiovascular;  Laterality: N/A;  . TOTAL KNEE ARTHROPLASTY Left 04/08/2016   Procedure: LEFT TOTAL KNEE ARTHROPLASTY;  Surgeon: Leandrew Koyanagi, MD;  Location: [place];  Service: Orthopedics;  Laterality: Left;  . UMBILICAL HERNIA REPAIR  03/08/2015   Procedure: UMBILICAL HERNIA REPAIR  ADULT;  Surgeon: Autumn Messing III, MD;  Location: [place];  Service: General;;  . VENTRAL HERNIA REPAIR N/A 03/08/2015   Procedure:  VENTRAL HERNIA  REPAIR ADULT;  Surgeon: Autumn Messing III, MD;  Location: [place];  Service: General;  Laterality: N/A;   Social History   Occupational History  . Not on file.   Social History Main Topics  . Smoking status: Never Smoker  . Smokeless tobacco: Never Used  . Alcohol use No  . Drug use: No  . Sexual activity: Not on file

## 2016-07-08 ENCOUNTER — Telehealth (INDEPENDENT_AMBULATORY_CARE_PROVIDER_SITE_OTHER): Payer: Self-pay | Admitting: Orthopaedic Surgery

## 2016-07-08 NOTE — Telephone Encounter (Signed)
Please advise on what he can and cannot do. See message below.

## 2016-07-08 NOTE — Telephone Encounter (Signed)
Pt wife [name] calling to get advice for pt as he just had a total knee replacement. She is wanting to know exactly how much walking he can do, she also said he hardly wants to get up and do anything. Please call her back at [phone]

## 2016-07-08 NOTE — Telephone Encounter (Signed)
He is allowed to walk as much as he wants

## 2016-07-08 NOTE — Telephone Encounter (Signed)
Called pts wife to advise

## 2016-09-03 DIAGNOSIS — C61 Malignant neoplasm of prostate: Secondary | ICD-10-CM | POA: Diagnosis not present

## 2016-09-03 DIAGNOSIS — N320 Bladder-neck obstruction: Secondary | ICD-10-CM | POA: Diagnosis not present

## 2016-09-03 DIAGNOSIS — N393 Stress incontinence (female) (male): Secondary | ICD-10-CM | POA: Diagnosis not present

## 2016-09-03 DIAGNOSIS — N200 Calculus of kidney: Secondary | ICD-10-CM | POA: Diagnosis not present

## 2016-09-16 DIAGNOSIS — I10 Essential (primary) hypertension: Secondary | ICD-10-CM | POA: Diagnosis not present

## 2016-09-16 DIAGNOSIS — E782 Mixed hyperlipidemia: Secondary | ICD-10-CM | POA: Diagnosis not present

## 2016-09-16 DIAGNOSIS — I4891 Unspecified atrial fibrillation: Secondary | ICD-10-CM | POA: Diagnosis not present

## 2016-09-16 DIAGNOSIS — I25118 Atherosclerotic heart disease of native coronary artery with other forms of angina pectoris: Secondary | ICD-10-CM | POA: Diagnosis not present

## 2016-11-26 DIAGNOSIS — E559 Vitamin D deficiency, unspecified: Secondary | ICD-10-CM | POA: Diagnosis not present

## 2016-11-26 DIAGNOSIS — Z125 Encounter for screening for malignant neoplasm of prostate: Secondary | ICD-10-CM | POA: Diagnosis not present

## 2016-11-26 DIAGNOSIS — E784 Other hyperlipidemia: Secondary | ICD-10-CM | POA: Diagnosis not present

## 2016-11-26 DIAGNOSIS — R7309 Other abnormal glucose: Secondary | ICD-10-CM | POA: Diagnosis not present

## 2016-11-26 DIAGNOSIS — I10 Essential (primary) hypertension: Secondary | ICD-10-CM | POA: Diagnosis not present

## 2016-12-03 DIAGNOSIS — L308 Other specified dermatitis: Secondary | ICD-10-CM | POA: Diagnosis not present

## 2016-12-03 DIAGNOSIS — Z0000 Encounter for general adult medical examination without abnormal findings: Secondary | ICD-10-CM | POA: Diagnosis not present

## 2016-12-03 DIAGNOSIS — M25562 Pain in left knee: Secondary | ICD-10-CM | POA: Diagnosis not present

## 2016-12-03 DIAGNOSIS — I77810 Thoracic aortic ectasia: Secondary | ICD-10-CM | POA: Diagnosis not present

## 2016-12-03 DIAGNOSIS — I110 Hypertensive heart disease with heart failure: Secondary | ICD-10-CM | POA: Diagnosis not present

## 2016-12-03 DIAGNOSIS — Z1389 Encounter for screening for other disorder: Secondary | ICD-10-CM | POA: Diagnosis not present

## 2016-12-03 DIAGNOSIS — I480 Paroxysmal atrial fibrillation: Secondary | ICD-10-CM | POA: Diagnosis not present

## 2016-12-03 DIAGNOSIS — I252 Old myocardial infarction: Secondary | ICD-10-CM | POA: Diagnosis not present

## 2016-12-03 DIAGNOSIS — H9319 Tinnitus, unspecified ear: Secondary | ICD-10-CM | POA: Diagnosis not present

## 2016-12-03 DIAGNOSIS — Z6835 Body mass index (BMI) 35.0-35.9, adult: Secondary | ICD-10-CM | POA: Diagnosis not present

## 2016-12-03 DIAGNOSIS — I429 Cardiomyopathy, unspecified: Secondary | ICD-10-CM | POA: Diagnosis not present

## 2017-03-05 DIAGNOSIS — N320 Bladder-neck obstruction: Secondary | ICD-10-CM | POA: Diagnosis not present

## 2017-03-05 DIAGNOSIS — C61 Malignant neoplasm of prostate: Secondary | ICD-10-CM | POA: Diagnosis not present

## 2017-03-05 DIAGNOSIS — R339 Retention of urine, unspecified: Secondary | ICD-10-CM | POA: Diagnosis not present

## 2017-03-16 DIAGNOSIS — E782 Mixed hyperlipidemia: Secondary | ICD-10-CM | POA: Diagnosis not present

## 2017-03-16 DIAGNOSIS — I4892 Unspecified atrial flutter: Secondary | ICD-10-CM | POA: Diagnosis not present

## 2017-03-16 DIAGNOSIS — I25118 Atherosclerotic heart disease of native coronary artery with other forms of angina pectoris: Secondary | ICD-10-CM | POA: Diagnosis not present

## 2017-03-16 DIAGNOSIS — I10 Essential (primary) hypertension: Secondary | ICD-10-CM | POA: Diagnosis not present

## 2017-03-16 DIAGNOSIS — I4891 Unspecified atrial fibrillation: Secondary | ICD-10-CM | POA: Diagnosis not present

## 2017-04-01 ENCOUNTER — Encounter (INDEPENDENT_AMBULATORY_CARE_PROVIDER_SITE_OTHER): Payer: Self-pay | Admitting: Orthopaedic Surgery

## 2017-04-01 ENCOUNTER — Ambulatory Visit (INDEPENDENT_AMBULATORY_CARE_PROVIDER_SITE_OTHER): Payer: PPO

## 2017-04-01 ENCOUNTER — Ambulatory Visit (INDEPENDENT_AMBULATORY_CARE_PROVIDER_SITE_OTHER): Payer: PPO | Admitting: Orthopaedic Surgery

## 2017-04-01 DIAGNOSIS — Z96652 Presence of left artificial knee joint: Secondary | ICD-10-CM

## 2017-04-01 NOTE — Progress Notes (Signed)
Office Visit Note   Patient: Jim Dodson           Date of Birth: 12-27-1940           MRN: [id number] Visit Date: 04/01/2017              Requested by: Shon Baton, [place], [place] [address] PCP: Shon Baton, MD   Assessment & Plan: Visit Diagnoses:  1. Status post total left knee replacement     Plan: 1 year TKA follow up plan  Patient is now one year out from a left total knee arthroplasty. Patient is doing very well and very pleased with the results. Radiographs reveal a total knee arthroplasty in good position, with no evidence of subsidence, loosening, or complicating features. It was reinforced that prophylactic antibiotics should be taken with any procedure including but not limited to dental work or colonoscopies. We plan to follow them at 1 year intervals at this time with radiographs at each visit, and as always we should be notified with any questions or concerns in the interim.  Follow-Up Instructions: Return in about 1 year (around 04/01/2018).   Orders:  Orders Placed This Encounter  Procedures  . XR Knee 1-2 Views Left   No orders of the defined types were placed in this encounter.     Procedures: No procedures performed   Clinical Data: No additional findings.   Subjective: Chief Complaint  Patient presents with  . Left Knee - Follow-up    Patient is one year status post left total knee replacement. He is overall well. Occasional popping that is painless. He is very happy with his knee replacement.    Review of Systems   Objective: Vital Signs: There were no vitals taken for this visit.  Physical Exam  Ortho Exam Left knee exam shows a fully healed surgical scar. He has excellent range of motion. Collaterals are stable. Patellar tracking is normal. Specialty Comments:  No specialty comments available.  Imaging: Xr Knee 1-2 Views Left  Result Date: 04/01/2017 Stable left total knee replacement in good  alignment    PMFS History: Patient Active Problem List   Diagnosis Date Noted  . Total knee replacement status 04/08/2016  . Sepsis ([place]) 07/25/2015  . On continuous oral anticoagulation 07/25/2015  . Essential hypertension 07/25/2015  . GERD (gastroesophageal reflux disease) 07/25/2015  . Acute upper respiratory infection 07/25/2015  . Incarcerated ventral hernia 03/14/2015  . Atrial fibrillation with rapid ventricular response ([place])   . Chronic coronary artery disease   . Elevated troponin   . Patient receiving intravenous heparin therapy   . SBO (small bowel obstruction) ([place]) 03/08/2015   Past Medical History:  Diagnosis Date  . Atrial fibrillation ([place])   . Coronary artery disease    stent placed 2009  . Headache   . History of hiatal hernia   . Hypertension   . Prostate cancer [facility])    prostate cancer, had sx  . Shortness of breath dyspnea     No family history on file.  Past Surgical History:  Procedure Laterality Date  . CARDIAC CATHETERIZATION     2009, stent placed  . CARDIOVERSION N/A 03/13/2015   Procedure: CARDIOVERSION;  Surgeon: Jerline Pain, MD;  Location: [place];  Service: Cardiovascular;  Laterality: N/A;  . COLONOSCOPY WITH PROPOFOL N/A 07/19/2015   Procedure: COLONOSCOPY WITH PROPOFOL;  Surgeon: Manya Silvas, MD;  Location: [facility] ENDOSCOPY;  Service: Endoscopy;  Laterality: N/A;  .  cryo surgery of prostate    . ESOPHAGOGASTRODUODENOSCOPY (EGD) WITH PROPOFOL N/A 07/19/2015   Procedure: ESOPHAGOGASTRODUODENOSCOPY (EGD) WITH PROPOFOL;  Surgeon: Manya Silvas, MD;  Location: [facility] ENDOSCOPY;  Service: Endoscopy;  Laterality: N/A;  . LAPAROTOMY N/A 03/08/2015   Procedure: EXPLORATORY LAPAROTOMY;  Surgeon: Autumn Messing III, MD;  Location: [place];  Service: General;  Laterality: N/A;  . LAPAROTOMY N/A 03/09/2015   Procedure: EXPLORATORY LAPAROTOMY AND EVACUATION OF HEMATOMA;  Surgeon: Autumn Messing III, MD;  Location: [place];  Service: General;  Laterality:  N/A;  . LYSIS OF ADHESION N/A 03/08/2015   Procedure: LYSIS OF ADHESION;  Surgeon: Autumn Messing III, MD;  Location: [place];  Service: General;  Laterality: N/A;  . OMENTECTOMY N/A 03/08/2015   Procedure: OMENTECTOMY;  Surgeon: Autumn Messing III, MD;  Location: [place];  Service: General;  Laterality: N/A;  . TEE WITHOUT CARDIOVERSION N/A 03/13/2015   Procedure: TRANSESOPHAGEAL ECHOCARDIOGRAM (TEE);  Surgeon: Jerline Pain, MD;  Location: [place];  Service: Cardiovascular;  Laterality: N/A;  . TOTAL KNEE ARTHROPLASTY Left 04/08/2016   Procedure: LEFT TOTAL KNEE ARTHROPLASTY;  Surgeon: Leandrew Koyanagi, MD;  Location: [place];  Service: Orthopedics;  Laterality: Left;  . UMBILICAL HERNIA REPAIR  03/08/2015   Procedure: UMBILICAL HERNIA REPAIR  ADULT;  Surgeon: Autumn Messing III, MD;  Location: [place];  Service: General;;  . VENTRAL HERNIA REPAIR N/A 03/08/2015   Procedure:  VENTRAL HERNIA  REPAIR ADULT;  Surgeon: Autumn Messing III, MD;  Location: [place];  Service: General;  Laterality: N/A;   Social History   Occupational History  . Not on file.   Social History Main Topics  . Smoking status: Never Smoker  . Smokeless tobacco: Never Used  . Alcohol use No  . Drug use: No  . Sexual activity: Not on file

## 2017-06-10 DIAGNOSIS — I110 Hypertensive heart disease with heart failure: Secondary | ICD-10-CM | POA: Diagnosis not present

## 2017-06-10 DIAGNOSIS — R42 Dizziness and giddiness: Secondary | ICD-10-CM | POA: Diagnosis not present

## 2017-06-10 DIAGNOSIS — Z6836 Body mass index (BMI) 36.0-36.9, adult: Secondary | ICD-10-CM | POA: Diagnosis not present

## 2017-06-10 DIAGNOSIS — I77810 Thoracic aortic ectasia: Secondary | ICD-10-CM | POA: Diagnosis not present

## 2017-06-10 DIAGNOSIS — I480 Paroxysmal atrial fibrillation: Secondary | ICD-10-CM | POA: Diagnosis not present

## 2017-06-10 DIAGNOSIS — M25562 Pain in left knee: Secondary | ICD-10-CM | POA: Diagnosis not present

## 2017-06-10 DIAGNOSIS — R7309 Other abnormal glucose: Secondary | ICD-10-CM | POA: Diagnosis not present

## 2017-06-10 DIAGNOSIS — R413 Other amnesia: Secondary | ICD-10-CM | POA: Diagnosis not present

## 2017-06-28 DIAGNOSIS — I10 Essential (primary) hypertension: Secondary | ICD-10-CM | POA: Diagnosis not present

## 2017-07-06 DIAGNOSIS — M859 Disorder of bone density and structure, unspecified: Secondary | ICD-10-CM | POA: Diagnosis not present

## 2017-09-10 DIAGNOSIS — I10 Essential (primary) hypertension: Secondary | ICD-10-CM | POA: Diagnosis not present

## 2017-09-10 DIAGNOSIS — N393 Stress incontinence (female) (male): Secondary | ICD-10-CM | POA: Diagnosis not present

## 2017-09-10 DIAGNOSIS — R159 Full incontinence of feces: Secondary | ICD-10-CM | POA: Diagnosis not present

## 2017-09-10 DIAGNOSIS — R3914 Feeling of incomplete bladder emptying: Secondary | ICD-10-CM | POA: Diagnosis not present

## 2017-09-10 DIAGNOSIS — I2510 Atherosclerotic heart disease of native coronary artery without angina pectoris: Secondary | ICD-10-CM | POA: Diagnosis not present

## 2017-09-10 DIAGNOSIS — C61 Malignant neoplasm of prostate: Secondary | ICD-10-CM | POA: Diagnosis not present

## 2017-09-10 DIAGNOSIS — Z955 Presence of coronary angioplasty implant and graft: Secondary | ICD-10-CM | POA: Diagnosis not present

## 2017-09-10 DIAGNOSIS — N320 Bladder-neck obstruction: Secondary | ICD-10-CM | POA: Diagnosis not present

## 2017-09-10 DIAGNOSIS — Z720 Tobacco use: Secondary | ICD-10-CM | POA: Diagnosis not present

## 2017-09-10 DIAGNOSIS — E7800 Pure hypercholesterolemia, unspecified: Secondary | ICD-10-CM | POA: Diagnosis not present

## 2017-09-21 DIAGNOSIS — M3501 Sicca syndrome with keratoconjunctivitis: Secondary | ICD-10-CM | POA: Diagnosis not present

## 2017-10-08 DIAGNOSIS — M3501 Sicca syndrome with keratoconjunctivitis: Secondary | ICD-10-CM | POA: Diagnosis not present

## 2017-10-15 ENCOUNTER — Other Ambulatory Visit (INDEPENDENT_AMBULATORY_CARE_PROVIDER_SITE_OTHER): Payer: Self-pay | Admitting: Physician Assistant

## 2017-10-15 ENCOUNTER — Telehealth (INDEPENDENT_AMBULATORY_CARE_PROVIDER_SITE_OTHER): Payer: Self-pay | Admitting: Orthopaedic Surgery

## 2017-10-15 DIAGNOSIS — E782 Mixed hyperlipidemia: Secondary | ICD-10-CM | POA: Diagnosis not present

## 2017-10-15 DIAGNOSIS — I10 Essential (primary) hypertension: Secondary | ICD-10-CM | POA: Diagnosis not present

## 2017-10-15 DIAGNOSIS — I25118 Atherosclerotic heart disease of native coronary artery with other forms of angina pectoris: Secondary | ICD-10-CM | POA: Diagnosis not present

## 2017-10-15 DIAGNOSIS — I4891 Unspecified atrial fibrillation: Secondary | ICD-10-CM | POA: Diagnosis not present

## 2017-10-15 NOTE — Telephone Encounter (Signed)
Ok to call in amoxicillin 500mg , take 4 tabs po one hour prior to dental procedure

## 2017-10-15 NOTE — Telephone Encounter (Signed)
IC she stated they did not want rx at this time but just wanted to know. They will call if/when they need rx.

## 2017-10-15 NOTE — Telephone Encounter (Signed)
Patient spouse wants to make patient a Dentist appointment. Patient has total knee replacement and thinks she was told patient needs a prescription of an antibiotic.  Please call patient to advise.

## 2017-10-15 NOTE — Telephone Encounter (Signed)
Please advise. Thanks.  

## 2017-11-08 DIAGNOSIS — M3501 Sicca syndrome with keratoconjunctivitis: Secondary | ICD-10-CM | POA: Diagnosis not present

## 2017-12-03 DIAGNOSIS — Z125 Encounter for screening for malignant neoplasm of prostate: Secondary | ICD-10-CM | POA: Diagnosis not present

## 2017-12-03 DIAGNOSIS — E559 Vitamin D deficiency, unspecified: Secondary | ICD-10-CM | POA: Diagnosis not present

## 2017-12-03 DIAGNOSIS — R7309 Other abnormal glucose: Secondary | ICD-10-CM | POA: Diagnosis not present

## 2017-12-03 DIAGNOSIS — R946 Abnormal results of thyroid function studies: Secondary | ICD-10-CM | POA: Diagnosis not present

## 2017-12-03 DIAGNOSIS — E7849 Other hyperlipidemia: Secondary | ICD-10-CM | POA: Diagnosis not present

## 2017-12-03 DIAGNOSIS — I10 Essential (primary) hypertension: Secondary | ICD-10-CM | POA: Diagnosis not present

## 2017-12-09 DIAGNOSIS — H04129 Dry eye syndrome of unspecified lacrimal gland: Secondary | ICD-10-CM | POA: Diagnosis not present

## 2017-12-09 DIAGNOSIS — Z1389 Encounter for screening for other disorder: Secondary | ICD-10-CM | POA: Diagnosis not present

## 2017-12-09 DIAGNOSIS — Z6836 Body mass index (BMI) 36.0-36.9, adult: Secondary | ICD-10-CM | POA: Diagnosis not present

## 2017-12-09 DIAGNOSIS — H9319 Tinnitus, unspecified ear: Secondary | ICD-10-CM | POA: Diagnosis not present

## 2017-12-09 DIAGNOSIS — C61 Malignant neoplasm of prostate: Secondary | ICD-10-CM | POA: Diagnosis not present

## 2017-12-09 DIAGNOSIS — L309 Dermatitis, unspecified: Secondary | ICD-10-CM | POA: Diagnosis not present

## 2017-12-09 DIAGNOSIS — M25562 Pain in left knee: Secondary | ICD-10-CM | POA: Diagnosis not present

## 2017-12-09 DIAGNOSIS — I77810 Thoracic aortic ectasia: Secondary | ICD-10-CM | POA: Diagnosis not present

## 2017-12-09 DIAGNOSIS — Z0000 Encounter for general adult medical examination without abnormal findings: Secondary | ICD-10-CM | POA: Diagnosis not present

## 2017-12-09 DIAGNOSIS — I429 Cardiomyopathy, unspecified: Secondary | ICD-10-CM | POA: Diagnosis not present

## 2017-12-09 DIAGNOSIS — I110 Hypertensive heart disease with heart failure: Secondary | ICD-10-CM | POA: Diagnosis not present

## 2017-12-09 DIAGNOSIS — I480 Paroxysmal atrial fibrillation: Secondary | ICD-10-CM | POA: Diagnosis not present

## 2017-12-15 DIAGNOSIS — D225 Melanocytic nevi of trunk: Secondary | ICD-10-CM | POA: Diagnosis not present

## 2017-12-15 DIAGNOSIS — L821 Other seborrheic keratosis: Secondary | ICD-10-CM | POA: Diagnosis not present

## 2017-12-15 DIAGNOSIS — D1800 Hemangioma unspecified site: Secondary | ICD-10-CM | POA: Diagnosis not present

## 2017-12-15 DIAGNOSIS — Z1283 Encounter for screening for malignant neoplasm of skin: Secondary | ICD-10-CM | POA: Diagnosis not present

## 2017-12-15 DIAGNOSIS — D692 Other nonthrombocytopenic purpura: Secondary | ICD-10-CM | POA: Diagnosis not present

## 2017-12-15 DIAGNOSIS — L578 Other skin changes due to chronic exposure to nonionizing radiation: Secondary | ICD-10-CM | POA: Diagnosis not present

## 2017-12-15 DIAGNOSIS — L820 Inflamed seborrheic keratosis: Secondary | ICD-10-CM | POA: Diagnosis not present

## 2017-12-15 DIAGNOSIS — L918 Other hypertrophic disorders of the skin: Secondary | ICD-10-CM | POA: Diagnosis not present

## 2017-12-15 DIAGNOSIS — L570 Actinic keratosis: Secondary | ICD-10-CM | POA: Diagnosis not present

## 2018-01-03 DIAGNOSIS — M3501 Sicca syndrome with keratoconjunctivitis: Secondary | ICD-10-CM | POA: Diagnosis not present

## 2018-01-31 DIAGNOSIS — M3501 Sicca syndrome with keratoconjunctivitis: Secondary | ICD-10-CM | POA: Diagnosis not present

## 2018-02-09 IMAGING — CR DG KNEE 1-2V PORT*L*
2 series · 2 of 2 positions shown · non-contrast
Comparison: None.

CLINICAL DATA: Postop total knee arthroplasty.

EXAM:
PORTABLE LEFT KNEE - 1-2 VIEW

[AP]
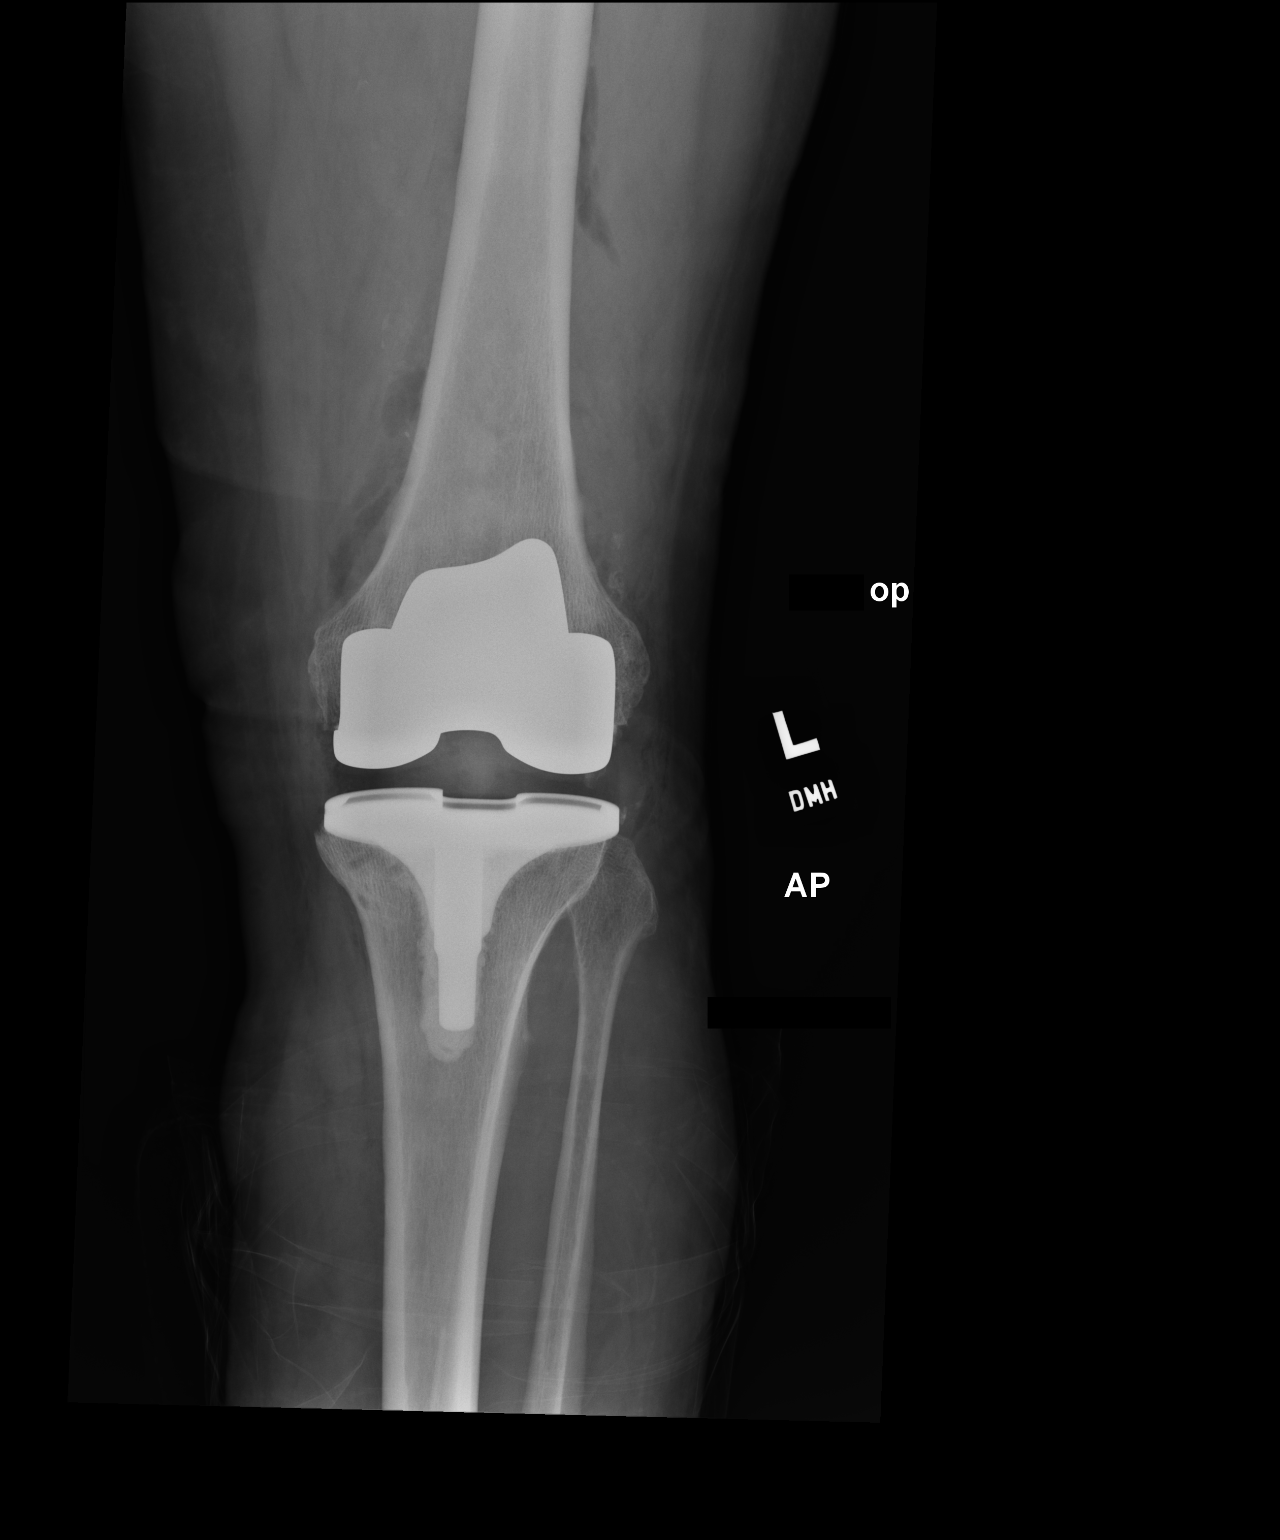

[xtable lateral]
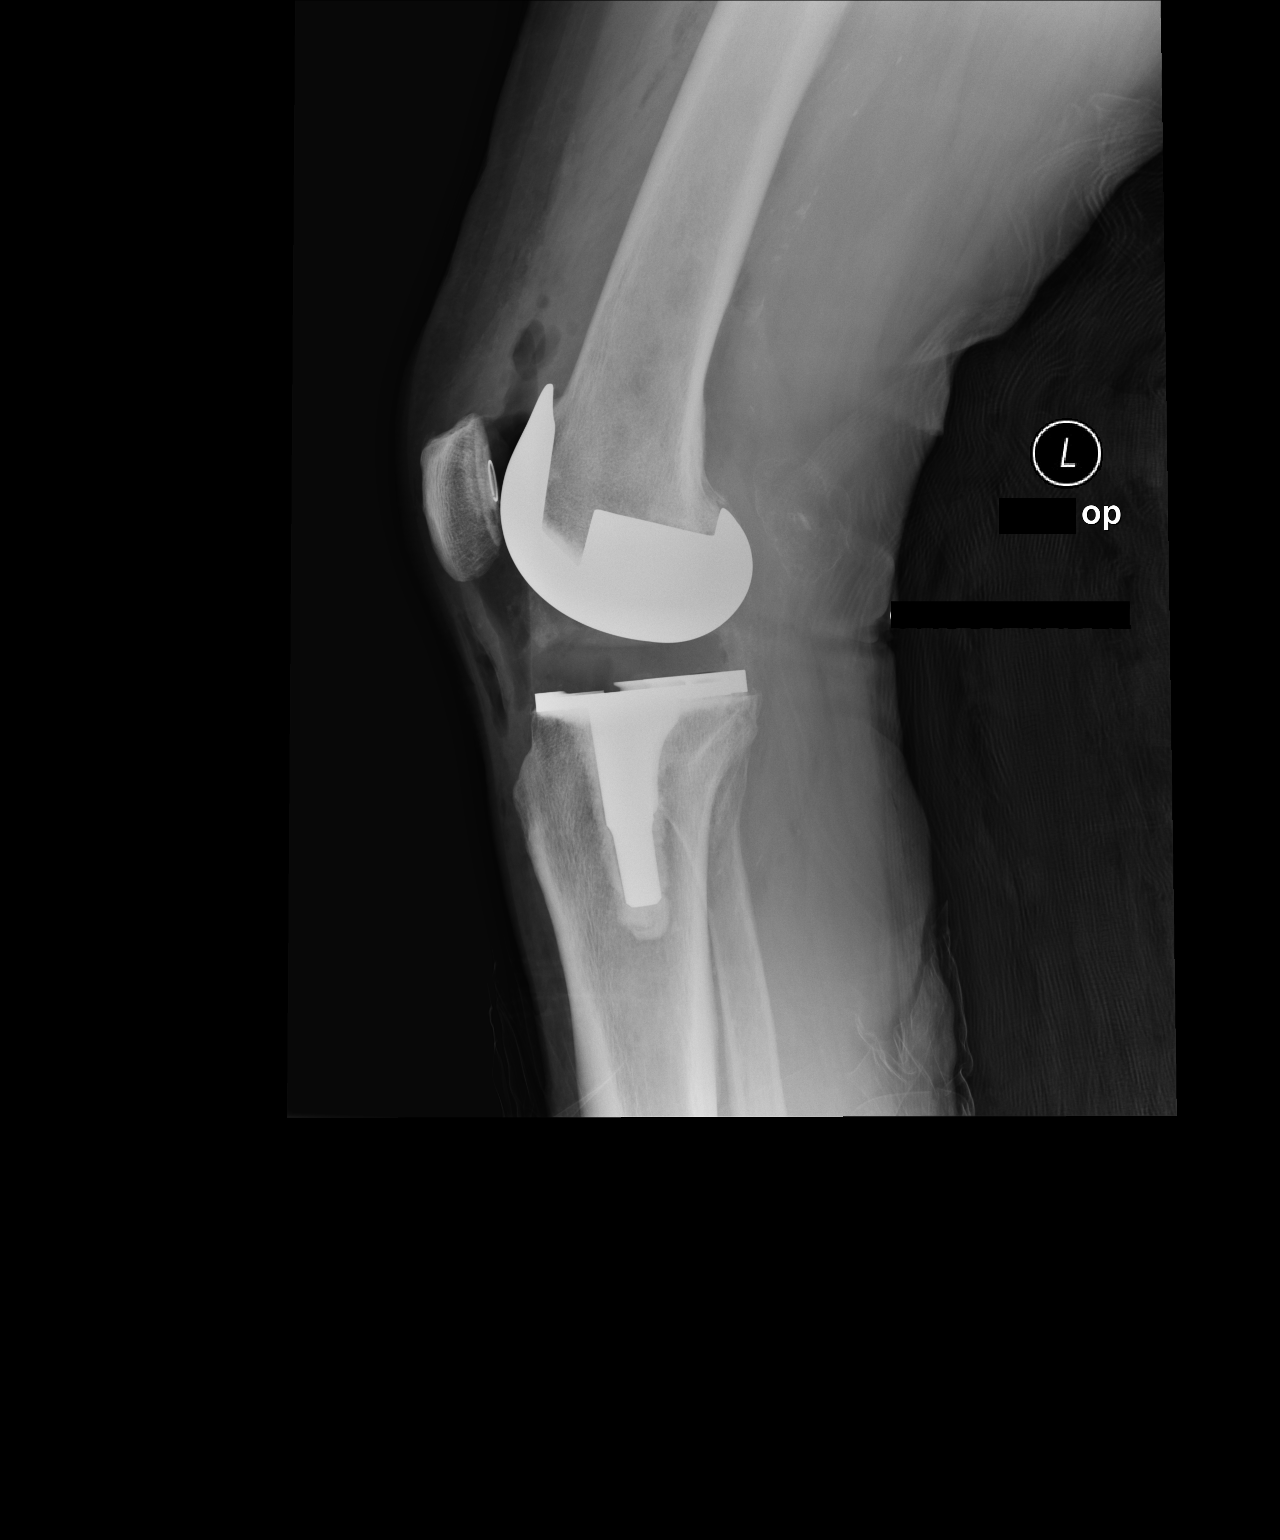

[2 of 2 positions shown; findings below may reference images not displayed]

FINDINGS: The femoral, tibial and patellar components are well seated. No
complicating features are demonstrated.
IMPRESSION: Well seated components of a total left knee arthroplasty.

## 2018-03-10 ENCOUNTER — Encounter: Payer: Self-pay | Admitting: Urology

## 2018-03-10 ENCOUNTER — Ambulatory Visit: Payer: PPO | Admitting: Urology

## 2018-03-10 VITALS — BP 117/70 | HR 55 | Ht 65.0 in | Wt 213.0 lb

## 2018-03-10 DIAGNOSIS — C61 Malignant neoplasm of prostate: Secondary | ICD-10-CM | POA: Diagnosis not present

## 2018-03-10 NOTE — Progress Notes (Signed)
03/10/2018 10:58 AM   Jim Dodson [birth year] [id number]  Referring provider: Shon Baton, MD [address] [place], [place] [address]  Chief Complaint  Patient presents with  . Establish Care    HPI: 77 year old male presents to establish local urologic care.  He has most recently been followed by Dr. Jacqlyn Larsen and last saw him at [facility] and February 2019.  He was diagnosed with adenocarcinoma the prostate in 2008.  His PSA was 13.1 and Gleason score 3+4.  He was treated with cryoablation of the prostate.  His PSA has slowly risen over the last 4 years:  04/2013   2.5 02/2016   4.2 09/2016   4.54 03/2017   4.64 09/2017   5.15  He has mild stress incontinence which is stable.  Denies dysuria or gross hematuria.  Denies flank, abdominal, pelvic or scrotal pain.   PMH: Past Medical History:  Diagnosis Date  . Arthritis   . Atrial fibrillation ([place])   . Coronary artery disease    stent placed 2009  . Headache   . History of hiatal hernia   . Hypertension   . Prostate cancer [facility])    prostate cancer, had sx  . Shortness of breath dyspnea     Surgical History: Past Surgical History:  Procedure Laterality Date  . CARDIAC CATHETERIZATION     2009, stent placed  . CARDIOVERSION N/A 03/13/2015   Procedure: CARDIOVERSION;  Surgeon: Jerline Pain, MD;  Location: [place];  Service: Cardiovascular;  Laterality: N/A;  . COLONOSCOPY WITH PROPOFOL N/A 07/19/2015   Procedure: COLONOSCOPY WITH PROPOFOL;  Surgeon: Manya Silvas, MD;  Location: [facility] ENDOSCOPY;  Service: Endoscopy;  Laterality: N/A;  . cryo surgery of prostate    . ESOPHAGOGASTRODUODENOSCOPY (EGD) WITH PROPOFOL N/A 07/19/2015   Procedure: ESOPHAGOGASTRODUODENOSCOPY (EGD) WITH PROPOFOL;  Surgeon: Manya Silvas, MD;  Location: [facility] ENDOSCOPY;  Service: Endoscopy;  Laterality: N/A;  . LAPAROTOMY N/A 03/08/2015   Procedure: EXPLORATORY LAPAROTOMY;  Surgeon: Autumn Messing III, MD;  Location: [place];  Service: General;   Laterality: N/A;  . LAPAROTOMY N/A 03/09/2015   Procedure: EXPLORATORY LAPAROTOMY AND EVACUATION OF HEMATOMA;  Surgeon: Autumn Messing III, MD;  Location: [place];  Service: General;  Laterality: N/A;  . LYSIS OF ADHESION N/A 03/08/2015   Procedure: LYSIS OF ADHESION;  Surgeon: Autumn Messing III, MD;  Location: [place];  Service: General;  Laterality: N/A;  . OMENTECTOMY N/A 03/08/2015   Procedure: OMENTECTOMY;  Surgeon: Autumn Messing III, MD;  Location: [place];  Service: General;  Laterality: N/A;  . TEE WITHOUT CARDIOVERSION N/A 03/13/2015   Procedure: TRANSESOPHAGEAL ECHOCARDIOGRAM (TEE);  Surgeon: Jerline Pain, MD;  Location: [place];  Service: Cardiovascular;  Laterality: N/A;  . TOTAL KNEE ARTHROPLASTY Left 04/08/2016   Procedure: LEFT TOTAL KNEE ARTHROPLASTY;  Surgeon: Leandrew Koyanagi, MD;  Location: [place];  Service: Orthopedics;  Laterality: Left;  . UMBILICAL HERNIA REPAIR  03/08/2015   Procedure: UMBILICAL HERNIA REPAIR  ADULT;  Surgeon: Autumn Messing III, MD;  Location: [place];  Service: General;;  . VENTRAL HERNIA REPAIR N/A 03/08/2015   Procedure:  VENTRAL HERNIA  REPAIR ADULT;  Surgeon: Autumn Messing III, MD;  Location: [place];  Service: General;  Laterality: N/A;    Home Medications:  Allergies as of 03/10/2018      Reactions   Statins Swelling, Other (See Comments)   Legs swelled up and cramped up really bad      Medication List  Accurate as of 03/10/18 10:58 AM. Always use your most recent med list.          acetaminophen 500 MG tablet Commonly known as:  TYLENOL Take 1,000 mg by mouth 2 (two) times daily.   ANTI-DIARRHEAL 2 MG capsule Generic drug:  loperamide Take 2 mg by mouth every morning.   ELIQUIS 5 MG Tabs tablet Generic drug:  apixaban Take 5 mg by mouth 2 (two) times daily.   FISH OIL PO Take by mouth.   HYALURONIC ACID PO Take 100 mg elemental calcium/kg/hr by mouth daily.   hydrALAZINE 50 MG tablet Commonly known as:  APRESOLINE Take 1 tablet (50 mg total) by mouth every  8 (eight) hours.   hydrochlorothiazide 25 MG tablet Commonly known as:  HYDRODIURIL Take 25 mg by mouth daily.   lisinopril 20 MG tablet Commonly known as:  PRINIVIL,ZESTRIL Take 20 mg by mouth daily.   MAGNESIUM-POTASSIUM PO Take 1 tablet by mouth 2 (two) times daily.   meclizine 12.5 MG tablet Commonly known as:  ANTIVERT Take 12.5 mg by mouth 3 (three) times daily as needed for dizziness.   memantine 5 MG tablet Commonly known as:  NAMENDA   methocarbamol 750 MG tablet Commonly known as:  ROBAXIN Take 1 tablet (750 mg total) by mouth 2 (two) times daily as needed for muscle spasms.   metoprolol tartrate 100 MG tablet Commonly known as:  LOPRESSOR Take 1 tablet (100 mg total) by mouth 2 (two) times daily.   NUTRITIONAL SUPPLEMENT PO Take 2 tablets by mouth 4 (four) times daily. [place]: 40% ketosterones/ 20% 3-ketosterone   omeprazole 20 MG capsule Commonly known as:  PRILOSEC Take 20 mg by mouth daily.   ondansetron 4 MG tablet Commonly known as:  ZOFRAN Take 1-2 tablets (4-8 mg total) by mouth every 8 (eight) hours as needed for nausea or vomiting.   Red Yeast Rice Extract 600 MG Caps Take 1,200 mg by mouth 2 (two) times daily.   Vitamin D3 5000 units Tabs Take 1 tablet by mouth daily.       Allergies:  Allergies  Allergen Reactions  . Statins Swelling and Other (See Comments)    Legs swelled up and cramped up really bad    Family History: No family history on file.  Social History:  reports that he quit smoking about 29 years ago. His smoking use included cigarettes. He quit after 20.00 years of use. He has never used smokeless tobacco. He reports that he does not drink alcohol or use drugs.  ROS: UROLOGY Frequent Urination?: Yes Hard to postpone urination?: Yes Burning/pain with urination?: No Get up at night to urinate?: Yes Leakage of urine?: Yes Urine stream starts and stops?: No Trouble starting stream?: No Do you have to strain to  urinate?: No Blood in urine?: No Urinary tract infection?: No Sexually transmitted disease?: No Injury to kidneys or bladder?: No Painful intercourse?: No Weak stream?: No Erection problems?: Yes Penile pain?: Yes  Gastrointestinal Nausea?: No Vomiting?: No Indigestion/heartburn?: No Diarrhea?: Yes Constipation?: No  Constitutional Fever: No Night sweats?: No Weight loss?: No Fatigue?: No  Skin Skin rash/lesions?: No Itching?: No  Eyes Blurred vision?: No Double vision?: No  Ears/Nose/Throat Sore throat?: No Sinus problems?: No  Hematologic/Lymphatic Swollen glands?: No Easy bruising?: No  Cardiovascular Leg swelling?: No Chest pain?: No  Respiratory Cough?: No Shortness of breath?: Yes  Endocrine Excessive thirst?: No  Musculoskeletal Back pain?: No Joint pain?: Yes  Neurological Headaches?: Yes Dizziness?: No  Psychologic Depression?: No Anxiety?: No  Physical Exam: BP 117/70 (BP Location: Left Arm, Patient Position: Sitting, Cuff Size: Large)   Pulse (!) 55   Ht 5\' 5"  (1.651 m)   Wt 213 lb (96.6 kg)   BMI 35.45 kg/m   Constitutional:  Alert and oriented, No acute distress. HEENT: [place] AT, moist mucus membranes.  Trachea midline, no masses. Cardiovascular: No clubbing, cyanosis, or edema. Respiratory: Normal respiratory effort, no increased work of breathing. GI: Abdomen is soft, nontender, nondistended, no abdominal masses GU: No CVA tenderness.  Prostate 40 g, smooth without nodules Lymph: No cervical or inguinal lymphadenopathy. Skin: No rashes, bruises or suspicious lesions. Neurologic: Grossly intact, no focal deficits, moving all 4 extremities. Psychiatric: Normal mood and affect.   Assessment & Plan:   77 year old male with a history of moderate risk prostate cancer status post cryoablation and a slowly rising PSA.  He has had no recent pelvic imaging.  They were informed his rising PSA is concerning for recurrent prostate  cancer.  His PSA was drawn today and if continuing to rise would recommend scheduling a prostate MRI.  Abbie Sons, [place] [address], [place],  [address] [phone]

## 2018-03-11 LAB — PSA: Prostate Specific Ag, Serum: 7.3 ng/mL — ABNORMAL HIGH (ref 0.0–4.0)

## 2018-03-13 ENCOUNTER — Other Ambulatory Visit: Payer: Self-pay | Admitting: Urology

## 2018-03-13 DIAGNOSIS — C61 Malignant neoplasm of prostate: Secondary | ICD-10-CM

## 2018-03-14 ENCOUNTER — Encounter: Payer: Self-pay | Admitting: Urology

## 2018-03-15 ENCOUNTER — Telehealth: Payer: Self-pay | Admitting: Urology

## 2018-03-15 NOTE — Telephone Encounter (Signed)
Patient was transferred over to [facility] [facility] to schedule his MRI and a follow up app was made.  Sharyn Lull

## 2018-03-16 ENCOUNTER — Telehealth: Payer: Self-pay

## 2018-03-16 NOTE — Telephone Encounter (Signed)
Spoke w/patient's wife and she states that they were previously notified and are scheduled for the MRI

## 2018-03-16 NOTE — Telephone Encounter (Signed)
-----   Message from Abbie Sons, MD sent at 03/13/2018 10:15 AM EDT ----- PSA has risen to 7.3.  Recommend scheduling prostate MRI with follow-up

## 2018-03-21 DIAGNOSIS — L821 Other seborrheic keratosis: Secondary | ICD-10-CM | POA: Diagnosis not present

## 2018-03-21 DIAGNOSIS — L578 Other skin changes due to chronic exposure to nonionizing radiation: Secondary | ICD-10-CM | POA: Diagnosis not present

## 2018-03-21 DIAGNOSIS — L570 Actinic keratosis: Secondary | ICD-10-CM | POA: Diagnosis not present

## 2018-03-21 DIAGNOSIS — L820 Inflamed seborrheic keratosis: Secondary | ICD-10-CM | POA: Diagnosis not present

## 2018-03-21 DIAGNOSIS — L219 Seborrheic dermatitis, unspecified: Secondary | ICD-10-CM | POA: Diagnosis not present

## 2018-04-01 ENCOUNTER — Ambulatory Visit (INDEPENDENT_AMBULATORY_CARE_PROVIDER_SITE_OTHER): Payer: PPO

## 2018-04-01 ENCOUNTER — Encounter (INDEPENDENT_AMBULATORY_CARE_PROVIDER_SITE_OTHER): Payer: Self-pay | Admitting: Orthopaedic Surgery

## 2018-04-01 ENCOUNTER — Ambulatory Visit (INDEPENDENT_AMBULATORY_CARE_PROVIDER_SITE_OTHER): Payer: PPO | Admitting: Orthopaedic Surgery

## 2018-04-01 DIAGNOSIS — Z96652 Presence of left artificial knee joint: Secondary | ICD-10-CM

## 2018-04-01 DIAGNOSIS — M25562 Pain in left knee: Secondary | ICD-10-CM

## 2018-04-01 DIAGNOSIS — G8929 Other chronic pain: Secondary | ICD-10-CM | POA: Diagnosis not present

## 2018-04-01 NOTE — Progress Notes (Signed)
Office Visit Note   Patient: Jim Dodson           Date of Birth: [birth year]           MRN: [id number] Visit Date: 04/01/2018              Requested by: Shon Baton, [place], [place] [address] PCP: Shon Baton, MD   Assessment & Plan: Visit Diagnoses:  1. Chronic pain of left knee   2. Status post total left knee replacement     Plan: Patient is 2 years status post left total knee replacement and doing very well.  I would like to check him in another year with 2 view x-rays of the left knee.  Questions encouraged and answered.  Follow-Up Instructions: Return in about 1 year (around 04/02/2019).   Orders:  Orders Placed This Encounter  Procedures  . XR KNEE 3 VIEW LEFT   No orders of the defined types were placed in this encounter.     Procedures: No procedures performed   Clinical Data: No additional findings.   Subjective: Chief Complaint  Patient presents with  . Left Knee - Pain    Jim Dodson is a 77 year old gentleman who is 2 years status post left total knee replacement.  He is doing great and reports no pain.  He has resumed all activity.  He has no complaints.   Review of Systems   Objective: Vital Signs: There were no vitals taken for this visit.  Physical Exam  Ortho Exam Left knee exam shows a fully healed surgical scar with excellent range of motion.  Collaterals are stable. Specialty Comments:  No specialty comments available.  Imaging: Xr Knee 3 View Left  Result Date: 04/01/2018 Stable left total knee replacement without evidence of any complications.    PMFS History: Patient Active Problem List   Diagnosis Date Noted  . Total knee replacement status 04/08/2016  . Sepsis ([place]) 07/25/2015  . On continuous oral anticoagulation 07/25/2015  . Essential hypertension 07/25/2015  . GERD (gastroesophageal reflux disease) 07/25/2015  . Acute upper respiratory infection 07/25/2015  . Incarcerated ventral hernia  03/14/2015  . Atrial fibrillation with rapid ventricular response ([place])   . Chronic coronary artery disease   . Elevated troponin   . Patient receiving intravenous heparin therapy   . SBO (small bowel obstruction) ([place]) 03/08/2015   Past Medical History:  Diagnosis Date  . Arthritis   . Atrial fibrillation ([place])   . Coronary artery disease    stent placed 2009  . Headache   . History of hiatal hernia   . Hypertension   . Prostate cancer [facility])    prostate cancer, had sx  . Shortness of breath dyspnea     History reviewed. No pertinent family history.  Past Surgical History:  Procedure Laterality Date  . CARDIAC CATHETERIZATION     2009, stent placed  . CARDIOVERSION N/A 03/13/2015   Procedure: CARDIOVERSION;  Surgeon: Jerline Pain, MD;  Location: [place];  Service: Cardiovascular;  Laterality: N/A;  . COLONOSCOPY WITH PROPOFOL N/A 07/19/2015   Procedure: COLONOSCOPY WITH PROPOFOL;  Surgeon: Manya Silvas, MD;  Location: [facility] ENDOSCOPY;  Service: Endoscopy;  Laterality: N/A;  . cryo surgery of prostate    . ESOPHAGOGASTRODUODENOSCOPY (EGD) WITH PROPOFOL N/A 07/19/2015   Procedure: ESOPHAGOGASTRODUODENOSCOPY (EGD) WITH PROPOFOL;  Surgeon: Manya Silvas, MD;  Location: [facility] ENDOSCOPY;  Service: Endoscopy;  Laterality: N/A;  . LAPAROTOMY N/A 03/08/2015   Procedure:  EXPLORATORY LAPAROTOMY;  Surgeon: Autumn Messing III, MD;  Location: [place];  Service: General;  Laterality: N/A;  . LAPAROTOMY N/A 03/09/2015   Procedure: EXPLORATORY LAPAROTOMY AND EVACUATION OF HEMATOMA;  Surgeon: Autumn Messing III, MD;  Location: [place];  Service: General;  Laterality: N/A;  . LYSIS OF ADHESION N/A 03/08/2015   Procedure: LYSIS OF ADHESION;  Surgeon: Autumn Messing III, MD;  Location: [place];  Service: General;  Laterality: N/A;  . OMENTECTOMY N/A 03/08/2015   Procedure: OMENTECTOMY;  Surgeon: Autumn Messing III, MD;  Location: [place];  Service: General;  Laterality: N/A;  . TEE WITHOUT CARDIOVERSION N/A 03/13/2015    Procedure: TRANSESOPHAGEAL ECHOCARDIOGRAM (TEE);  Surgeon: Jerline Pain, MD;  Location: [place];  Service: Cardiovascular;  Laterality: N/A;  . TOTAL KNEE ARTHROPLASTY Left 04/08/2016   Procedure: LEFT TOTAL KNEE ARTHROPLASTY;  Surgeon: Leandrew Koyanagi, MD;  Location: [place];  Service: Orthopedics;  Laterality: Left;  . UMBILICAL HERNIA REPAIR  03/08/2015   Procedure: UMBILICAL HERNIA REPAIR  ADULT;  Surgeon: Autumn Messing III, MD;  Location: [place];  Service: General;;  . VENTRAL HERNIA REPAIR N/A 03/08/2015   Procedure:  VENTRAL HERNIA  REPAIR ADULT;  Surgeon: Autumn Messing III, MD;  Location: [place];  Service: General;  Laterality: N/A;   Social History   Occupational History  . Not on file  Tobacco Use  . Smoking status: Former Smoker    Years: 20.00    Types: Cigarettes    Last attempt to quit: 08/03/1988    Years since quitting: 29.6  . Smokeless tobacco: Never Used  Substance and Sexual Activity  . Alcohol use: No  . Drug use: No  . Sexual activity: Yes

## 2018-04-07 ENCOUNTER — Ambulatory Visit: Payer: PPO | Admitting: Urology

## 2018-04-11 ENCOUNTER — Ambulatory Visit
Admission: RE | Admit: 2018-04-11 | Discharge: 2018-04-11 | Disposition: A | Discharge status: Home / Self Care | Payer: PPO | Source: Ambulatory Visit | Attending: Urology | Admitting: Urology

## 2018-04-11 DIAGNOSIS — R972 Elevated prostate specific antigen [PSA]: Secondary | ICD-10-CM | POA: Diagnosis not present

## 2018-04-11 DIAGNOSIS — C61 Malignant neoplasm of prostate: Secondary | ICD-10-CM

## 2018-04-11 MED ORDER — GADOBENATE DIMEGLUMINE 529 MG/ML IV SOLN
20.0000 mL | Freq: Once | INTRAVENOUS | Status: AC | PRN
  Administered 2018-04-11: 20 mL via INTRAVENOUS

## 2018-04-12 DIAGNOSIS — I4891 Unspecified atrial fibrillation: Secondary | ICD-10-CM | POA: Diagnosis not present

## 2018-04-15 ENCOUNTER — Ambulatory Visit: Payer: PPO | Admitting: Urology

## 2018-04-20 ENCOUNTER — Ambulatory Visit (INDEPENDENT_AMBULATORY_CARE_PROVIDER_SITE_OTHER): Payer: PPO | Admitting: Urology

## 2018-04-20 ENCOUNTER — Encounter: Payer: Self-pay | Admitting: Urology

## 2018-04-20 VITALS — BP 102/62 | HR 90 | Ht 65.0 in | Wt 212.6 lb

## 2018-04-20 DIAGNOSIS — C61 Malignant neoplasm of prostate: Secondary | ICD-10-CM

## 2018-04-20 DIAGNOSIS — E785 Hyperlipidemia, unspecified: Secondary | ICD-10-CM | POA: Insufficient documentation

## 2018-04-20 NOTE — Progress Notes (Signed)
04/20/2018 3:02 PM   Jim Dodson 1940-08-10 [id number]  Referring provider: Shon Baton, MD [address] [place], [place] [address]  Chief Complaint  Patient presents with  . Results    HPI: 77 year old male presents for MRI results.  Refer to my previous dictation of 03/10/2018.  He has had a slowly rising PSA after cryoablation for prostate cancer performed in 2008.  MRI was performed in [place] on 04/11/2018 and there was a 1.4 x 2.4 cm focus of enhancing tissue along the left posterior lateral apex which was suspicious for residual/recurrent disease.  There was also abnormal soft tissue involving the base of the left seminal vesicle.  No pelvic adenopathy was present.   PMH: Past Medical History:  Diagnosis Date  . Arthritis   . Atrial fibrillation ([place])   . Coronary artery disease    stent placed 2009  . Headache   . History of hiatal hernia   . Hypertension   . Prostate cancer [facility])    prostate cancer, had sx  . Shortness of breath dyspnea     Surgical History: Past Surgical History:  Procedure Laterality Date  . CARDIAC CATHETERIZATION     2009, stent placed  . CARDIOVERSION N/A 03/13/2015   Procedure: CARDIOVERSION;  Surgeon: Jerline Pain, MD;  Location: [place];  Service: Cardiovascular;  Laterality: N/A;  . COLONOSCOPY WITH PROPOFOL N/A 07/19/2015   Procedure: COLONOSCOPY WITH PROPOFOL;  Surgeon: Manya Silvas, MD;  Location: [facility] ENDOSCOPY;  Service: Endoscopy;  Laterality: N/A;  . cryo surgery of prostate    . ESOPHAGOGASTRODUODENOSCOPY (EGD) WITH PROPOFOL N/A 07/19/2015   Procedure: ESOPHAGOGASTRODUODENOSCOPY (EGD) WITH PROPOFOL;  Surgeon: Manya Silvas, MD;  Location: [facility] ENDOSCOPY;  Service: Endoscopy;  Laterality: N/A;  . LAPAROTOMY N/A 03/08/2015   Procedure: EXPLORATORY LAPAROTOMY;  Surgeon: Autumn Messing III, MD;  Location: [place];  Service: General;  Laterality: N/A;  . LAPAROTOMY N/A 03/09/2015   Procedure: EXPLORATORY LAPAROTOMY AND  EVACUATION OF HEMATOMA;  Surgeon: Autumn Messing III, MD;  Location: [place];  Service: General;  Laterality: N/A;  . LYSIS OF ADHESION N/A 03/08/2015   Procedure: LYSIS OF ADHESION;  Surgeon: Autumn Messing III, MD;  Location: [place];  Service: General;  Laterality: N/A;  . OMENTECTOMY N/A 03/08/2015   Procedure: OMENTECTOMY;  Surgeon: Autumn Messing III, MD;  Location: [place];  Service: General;  Laterality: N/A;  . TEE WITHOUT CARDIOVERSION N/A 03/13/2015   Procedure: TRANSESOPHAGEAL ECHOCARDIOGRAM (TEE);  Surgeon: Jerline Pain, MD;  Location: [place];  Service: Cardiovascular;  Laterality: N/A;  . TOTAL KNEE ARTHROPLASTY Left 04/08/2016   Procedure: LEFT TOTAL KNEE ARTHROPLASTY;  Surgeon: Leandrew Koyanagi, MD;  Location: [place];  Service: Orthopedics;  Laterality: Left;  . UMBILICAL HERNIA REPAIR  03/08/2015   Procedure: UMBILICAL HERNIA REPAIR  ADULT;  Surgeon: Autumn Messing III, MD;  Location: [place];  Service: General;;  . VENTRAL HERNIA REPAIR N/A 03/08/2015   Procedure:  VENTRAL HERNIA  REPAIR ADULT;  Surgeon: Autumn Messing III, MD;  Location: [place];  Service: General;  Laterality: N/A;    Home Medications:  Allergies as of 04/20/2018      Reactions   Statins Swelling, Other (See Comments)   Legs swelled up and cramped up really bad      Medication List        Accurate as of 04/20/18  3:02 PM. Always use your most recent med list.  acetaminophen 500 MG tablet Commonly known as:  TYLENOL Take 1,000 mg by mouth 2 (two) times daily.   ANTI-DIARRHEAL 2 MG capsule Generic drug:  loperamide Take 2 mg by mouth every morning.   ELIQUIS 5 MG Tabs tablet Generic drug:  apixaban Take 5 mg by mouth 2 (two) times daily.   FISH OIL PO Take by mouth.   HYALURONIC ACID PO Take 100 mg elemental calcium/kg/hr by mouth daily.   hydrALAZINE 50 MG tablet Commonly known as:  APRESOLINE Take 1 tablet (50 mg total) by mouth every 8 (eight) hours.   hydrochlorothiazide 25 MG tablet Commonly known as:   HYDRODIURIL Take 25 mg by mouth daily.   lisinopril 20 MG tablet Commonly known as:  PRINIVIL,ZESTRIL Take 20 mg by mouth daily.   MAGNESIUM-POTASSIUM PO Take 1 tablet by mouth 2 (two) times daily.   meclizine 12.5 MG tablet Commonly known as:  ANTIVERT Take 12.5 mg by mouth 3 (three) times daily as needed for dizziness.   memantine 5 MG tablet Commonly known as:  NAMENDA   methocarbamol 750 MG tablet Commonly known as:  ROBAXIN Take 1 tablet (750 mg total) by mouth 2 (two) times daily as needed for muscle spasms.   metoprolol tartrate 100 MG tablet Commonly known as:  LOPRESSOR Take 1 tablet (100 mg total) by mouth 2 (two) times daily.   NUTRITIONAL SUPPLEMENT PO Take 2 tablets by mouth 4 (four) times daily. [place]: 40% ketosterones/ 20% 3-ketosterone   omeprazole 20 MG capsule Commonly known as:  PRILOSEC Take 20 mg by mouth daily.   ondansetron 4 MG tablet Commonly known as:  ZOFRAN Take 1-2 tablets (4-8 mg total) by mouth every 8 (eight) hours as needed for nausea or vomiting.   Red Yeast Rice Extract 600 MG Caps Take 1,200 mg by mouth 2 (two) times daily.   Vitamin D3 5000 units Tabs Take 1 tablet by mouth daily.       Allergies:  Allergies  Allergen Reactions  . Statins Swelling and Other (See Comments)    Legs swelled up and cramped up really bad    Family History: No family history on file.  Social History:  reports that he quit smoking about 29 years ago. His smoking use included cigarettes. He quit after 20.00 years of use. He has never used smokeless tobacco. He reports that he does not drink alcohol or use drugs.  ROS: UROLOGY Frequent Urination?: No Hard to postpone urination?: No Burning/pain with urination?: No Get up at night to urinate?: No Leakage of urine?: No Urine stream starts and stops?: No Trouble starting stream?: No Do you have to strain to urinate?: No Blood in urine?: No Urinary tract infection?: No Sexually transmitted  disease?: No Injury to kidneys or bladder?: No Painful intercourse?: No Weak stream?: No Erection problems?: No Penile pain?: No  Gastrointestinal Nausea?: No Vomiting?: No Indigestion/heartburn?: No Diarrhea?: No Constipation?: No  Constitutional Fever: No Night sweats?: No Weight loss?: No Fatigue?: No  Skin Skin rash/lesions?: No Itching?: No  Eyes Blurred vision?: No Double vision?: No  Ears/Nose/Throat Sore throat?: No Sinus problems?: No  Hematologic/Lymphatic Swollen glands?: No Easy bruising?: No  Cardiovascular Leg swelling?: No Chest pain?: No  Respiratory Cough?: No Shortness of breath?: No  Endocrine Excessive thirst?: No  Musculoskeletal Back pain?: No Joint pain?: No  Neurological Headaches?: No Dizziness?: No  Psychologic Depression?: No Anxiety?: No  Physical Exam: BP 102/62 (BP Location: Left Arm, Patient Position: Sitting, Cuff Size: Normal)  Pulse 90   Ht 5\' 5"  (1.651 m)   Wt 212 lb 9.6 oz (96.4 kg)   BMI 35.38 kg/m    Constitutional:  Alert and oriented, No acute distress.   Assessment & Plan:   77 year old male with a rising PSA after primary cryoablation for prostate cancer in 2008.  There is an area of enhancing soft tissue at the posterior lateral apex suspicious for residual/recurrent disease.  I discussed with Jim Dodson and his wife these findings.  Fusion biopsy was discussed for tissue confirmation however based on the MRI and his rising PSA this is indicative of recurrent disease.  The option of radiation oncology referral was also discussed.  He would like to proceed with radiation oncology referral and if confirmatory biopsy needed prior to radiation he can be referred for the biopsy at that time.  Greater than 50% of this 15-minute visit was spent counseling the patient.   Abbie Sons, [place] [address] [place], [place] [address] [phone]

## 2018-05-02 ENCOUNTER — Other Ambulatory Visit: Payer: Self-pay

## 2018-05-02 ENCOUNTER — Ambulatory Visit
Admission: RE | Admit: 2018-05-02 | Discharge: 2018-05-02 | Disposition: A | Discharge status: Home / Self Care | Payer: PPO | Source: Ambulatory Visit | Attending: Radiation Oncology | Admitting: Radiation Oncology

## 2018-05-02 ENCOUNTER — Encounter: Payer: Self-pay | Admitting: Radiation Oncology

## 2018-05-02 ENCOUNTER — Other Ambulatory Visit: Payer: Self-pay | Admitting: *Deleted

## 2018-05-02 VITALS — BP 110/75 | HR 73 | Temp 96.8°F | Resp 18 | Wt 211.2 lb

## 2018-05-02 DIAGNOSIS — C61 Malignant neoplasm of prostate: Secondary | ICD-10-CM | POA: Diagnosis not present

## 2018-05-02 DIAGNOSIS — Z79899 Other long term (current) drug therapy: Secondary | ICD-10-CM | POA: Diagnosis not present

## 2018-05-02 DIAGNOSIS — I4891 Unspecified atrial fibrillation: Secondary | ICD-10-CM | POA: Diagnosis not present

## 2018-05-02 DIAGNOSIS — I10 Essential (primary) hypertension: Secondary | ICD-10-CM | POA: Diagnosis not present

## 2018-05-02 DIAGNOSIS — N529 Male erectile dysfunction, unspecified: Secondary | ICD-10-CM | POA: Diagnosis not present

## 2018-05-02 DIAGNOSIS — Z7901 Long term (current) use of anticoagulants: Secondary | ICD-10-CM | POA: Diagnosis not present

## 2018-05-02 DIAGNOSIS — R350 Frequency of micturition: Secondary | ICD-10-CM | POA: Diagnosis not present

## 2018-05-02 DIAGNOSIS — Z87891 Personal history of nicotine dependence: Secondary | ICD-10-CM | POA: Diagnosis not present

## 2018-05-02 DIAGNOSIS — I2510 Atherosclerotic heart disease of native coronary artery without angina pectoris: Secondary | ICD-10-CM | POA: Insufficient documentation

## 2018-05-02 DIAGNOSIS — M129 Arthropathy, unspecified: Secondary | ICD-10-CM | POA: Insufficient documentation

## 2018-05-02 DIAGNOSIS — R0602 Shortness of breath: Secondary | ICD-10-CM | POA: Insufficient documentation

## 2018-05-02 NOTE — Consult Note (Signed)
NEW PATIENT EVALUATION  Name: Jim Dodson  MRN: [id number]  Date:   05/02/2018     DOB: 1941/05/08   This 77 y.o. male patient presents to the clinic for initial evaluation of adenocarcinoma of the prostate status post cryotherapy back in.2008 with rising PSA as well as MRI evidence of T IIIB disease.  REFERRING PHYSICIAN: Shon Baton, MD  CHIEF COMPLAINT:  Chief Complaint  Patient presents with  . Prostate Cancer    Pt is here for initial consultation of prostate cancer     DIAGNOSIS: The encounter diagnosis was Malignant neoplasm of prostate ([name]).   PREVIOUS INVESTIGATIONS:  MRI scans reviewed bone scan ordered Clinical notes reviewed Previous pathology reports reviewed  HPI: patient is a 77 year old male underwent cryotherapy back in2007 for a Gleason 7 (4+3)he underwent cryotherapy at [facility] and isn't been having a biochemical failure as early as 2014 with a PSA of 2.5. His most recent PSA has been in the 7 range.he recently underwent an MRI scan showing a 1.4 x 2.4 cm focus of enhancing soft tissue along the left posterior lateral 8 apex worrisome for residual tumor with suspected involvement of the base as well as the left seminal vesicle. No suspicious lymphadenopathy or osseous metastatic disease was noted.his original PSA back in 2008 was 13.1.he does have some urinary issues such as frequency and urgency erectile dysfunction. He's been seen by urology now referred to radiation oncology for consideration of salvage treatment.  PLANNED TREATMENT REGIMEN: salvage radiation therapy along with androgen deprivation therapy  PAST MEDICAL HISTORY:  has a past medical history of Arthritis, Atrial fibrillation ([place]), Coronary artery disease, Headache, History of hiatal hernia, Hypertension, Prostate cancer ([place]), and Shortness of breath dyspnea.    PAST SURGICAL HISTORY:  Past Surgical History:  Procedure Laterality Date  . CARDIAC CATHETERIZATION     2009, stent placed  .  CARDIOVERSION N/A 03/13/2015   Procedure: CARDIOVERSION;  Surgeon: Jerline Pain, MD;  Location: [place];  Service: Cardiovascular;  Laterality: N/A;  . COLONOSCOPY WITH PROPOFOL N/A 07/19/2015   Procedure: COLONOSCOPY WITH PROPOFOL;  Surgeon: Manya Silvas, MD;  Location: [facility] ENDOSCOPY;  Service: Endoscopy;  Laterality: N/A;  . cryo surgery of prostate    . ESOPHAGOGASTRODUODENOSCOPY (EGD) WITH PROPOFOL N/A 07/19/2015   Procedure: ESOPHAGOGASTRODUODENOSCOPY (EGD) WITH PROPOFOL;  Surgeon: Manya Silvas, MD;  Location[facility] END[facility]COPY;  Service: Endoscopy;  Laterality: N/A;  . LAPAROTOMY N/A 03/08/2015   Procedure: EXPLORATORY LAPAROTOMY;  Surgeon: Autumn Messing III, MD;  Location: [place];  Service: General;  Laterality: N/A;  . LAPAROTOMY N/A 03/09/2015   Procedure: EXPLORATORY LAPAROTOMY AND EVACUATION OF HEMATOMA;  Surgeon: Autumn Messing III, MD;  Location: [place];  Service: General;  Laterality: N/A;  . LYSIS OF ADHESION N/A 03/08/2015   Procedure: LYSIS OF ADHESION;  Surgeon: Autumn Messing III, MD;  Location: [place];  Service: General;  Laterality: N/A;  . OMENTECTOMY N/A 03/08/2015   Procedure: OMENTECTOMY;  Surgeon: Autumn Messing III, MD;  Location: [place];  Service: General;  Laterality: N/A;  . TEE WITHOUT CARDIOVERSION N/A 03/13/2015   Procedure: TRANSESOPHAGEAL ECHOCARDIOGRAM (TEE);  Surgeon: Jerline Pain, MD;  Location: [place];  Service: Cardiovascular;  Laterality: N/A;  . TOTAL KNEE ARTHROPLASTY Left 04/08/2016   Procedure: LEFT TOTAL KNEE ARTHROPLASTY;  Surgeon: Leandrew Koyanagi, MD;  Location: [place];  Service: Orthopedics;  Laterality: Left;  . UMBILICAL HERNIA REPAIR  03/08/2015   Procedure: UMBILICAL HERNIA REPAIR  ADULT;  Surgeon:  Autumn Messing III, MD;  Location: [place];  Service: General;;  . VENTRAL HERNIA REPAIR N/A 03/08/2015   Procedure:  VENTRAL HERNIA  REPAIR ADULT;  Surgeon: Autumn Messing III, MD;  Location: [place];  Service: General;  Laterality: N/A;    FAMILY HISTORY: family history is not on  file.  SOCIAL HISTORY:  reports that he quit smoking about 29 years ago. His smoking use included cigarettes. He quit after 20.00 years of use. He has never used smokeless tobacco. He reports that he does not drink alcohol or use drugs.  ALLERGIES: Statins  MEDICATIONS:  Current Outpatient Medications  Medication Sig Dispense Refill  . acetaminophen (TYLENOL) 500 MG tablet Take 1,000 mg by mouth 2 (two) times daily.    [place] Kitchen apixaban (ELIQUIS) 5 MG TABS tablet Take 5 mg by mouth 2 (two) times daily.    . Cholecalciferol (VITAMIN D3) 5000 units TABS Take 1 tablet by mouth daily.    [place] Kitchen Hyaluronic Acid-Vitamin C (HYALURONIC ACID PO) Take 100 mg elemental calcium/kg/hr by mouth daily.    . hydrALAZINE (APRESOLINE) 50 MG tablet Take 1 tablet (50 mg total) by mouth every 8 (eight) hours. 90 tablet 0  . hydrochlorothiazide (HYDRODIURIL) 25 MG tablet Take 25 mg by mouth daily.    [place] Kitchen lisinopril (PRINIVIL,ZESTRIL) 20 MG tablet Take 20 mg by mouth daily.    [place] Kitchen loperamide (ANTI-DIARRHEAL) 2 MG capsule Take 2 mg by mouth every morning.    [place] Kitchen MAGNESIUM-POTASSIUM PO Take 1 tablet by mouth 2 (two) times daily.     . meclizine (ANTIVERT) 12.5 MG tablet Take 12.5 mg by mouth 3 (three) times daily as needed for dizziness.    . memantine (NAMENDA) 5 MG tablet     . methocarbamol (ROBAXIN) 750 MG tablet Take 1 tablet (750 mg total) by mouth 2 (two) times daily as needed for muscle spasms. 60 tablet 0  . metoprolol (LOPRESSOR) 100 MG tablet Take 1 tablet (100 mg total) by mouth 2 (two) times daily. 60 tablet 0  . Nutritional Supplements (NUTRITIONAL SUPPLEMENT PO) Take 2 tablets by mouth 4 (four) times daily. [place]: 40% ketosterones/ 20% 3-ketosterone     . Omega-3 Fatty Acids (FISH OIL PO) Take by mouth.    [place] Kitchen omeprazole (PRILOSEC) 20 MG capsule Take 20 mg by mouth daily.    . ondansetron (ZOFRAN) 4 MG tablet Take 1-2 tablets (4-8 mg total) by mouth every 8 (eight) hours as needed for nausea or vomiting. 40 tablet 0  .  Red Yeast Rice Extract 600 MG CAPS Take 1,200 mg by mouth 2 (two) times daily.     No current facility-administered medications for this encounter.     ECOG PERFORMANCE STATUS:  0 - Asymptomatic  REVIEW OF SYSTEMS:  Patient denies any weight loss, fatigue, weakness, fever, chills or night sweats. Patient denies any loss of vision, blurred vision. Patient denies any ringing  of the ears or hearing loss. No irregular heartbeat. Patient denies heart murmur or history of fainting. Patient denies any chest pain or pain radiating to her upper extremities. Patient denies any shortness of breath, difficulty breathing at night, cough or hemoptysis. Patient denies any swelling in the lower legs. Patient denies any nausea vomiting, vomiting of blood, or coffee ground material in the vomitus. Patient denies any stomach pain. Patient states has had normal bowel movements no significant constipation or diarrhea. Patient denies any dysuria, hematuria or significant nocturia. Patient denies any problems walking, swelling in the joints or loss of  balance. Patient denies any skin changes, loss of hair or loss of weight. Patient denies any excessive worrying or anxiety or significant depression. Patient denies any problems with insomnia. Patient denies excessive thirst, polyuria, polydipsia. Patient denies any swollen glands, patient denies easy bruising or easy bleeding. Patient denies any recent infections, allergies or URI. Patient "s visual fields have not changed significantly in recent time.    PHYSICAL EXAM: BP 110/75 (BP Location: Left Arm, Patient Position: Sitting)   Pulse 73   Temp (!) 96.8 F (36 C) (Tympanic)   Resp 18   Wt 211 lb 3.2 oz (95.8 kg)   BMI 35.15 kg/m  n rectal exam rectal sphincter tone is good prostate is somewhat asymmetric although no distinct nodularity is noted sulcus is preserved bilaterally. Remainder rectal exam is unremarkable.Well-developed well-nourished patient in NAD. HEENT  reveals PERLA, EOMI, discs not visualized.  Oral cavity is clear. No oral mucosal lesions are identified. Neck is clear without evidence of cervical or supraclavicular adenopathy. Lungs are clear to A&P. Cardiac examination is essentially unremarkable with regular rate and rhythm without murmur rub or thrill. Abdomen is benign with no organomegaly or masses noted. Motor sensory and DTR levels are equal and symmetric in the upper and lower extremities. Cranial nerves II through XII are grossly intact. Proprioception is intact. No peripheral adenopathy or edema is identified. No motor or sensory levels are noted. Crude visual fields are within normal range.  LABORATORY DATA: pathology reports reviewed    RADIOLOGY RESULTS:MRI scan reviewed bone scan ordered   IMPRESSION: ecurrent adenocarcinoma the prostate in patient status post cryotherapy 2007 with clearly rising PSA and abnormal MRI findings suggestive ofprogressive disease in 77 year old male  PLAN: at this time I have ordered a bone scan just to rule out possibility of bone metastasis at this time. Should that be negative would recommend a course of salvage radiation therapy. Would treat up to 8000 cGy over 8 weeks to his prostate as well as treatment of his pelvic lymph nodes based on the [facility] showing approximately 30% chance of pelvic lymph node involvement with his original treatment parameters. Risks and methods of treatment including exacerbation of his lower urinary tract symptoms diarrhea fatigue alteration of blood counts all were described in detail to the patient and his wife. I've personally set up and ordered CT simulation after his bone scan is complete. I've also asking Dr. Dene Gentry office to start androgen deprivation therapy with Lupron for at least 6 months to 1 year. Patient and wife seem to compress my treatment plan well.  I would like to take this opportunity to thank you for allowing me to participate in the  care of your patient.Noreene Filbert, MD

## 2018-05-04 ENCOUNTER — Telehealth: Payer: Self-pay | Admitting: Urology

## 2018-05-04 NOTE — Telephone Encounter (Signed)
Pt's wife needs you to call her for ANYTHING!  Do not send anything on MYCHART.  Husband is in early stages of dementia (per wife) and he is unable to read things in [place].  Jim Dodson [id number]  ALL correspondence needs to come to her.

## 2018-05-04 NOTE — Telephone Encounter (Signed)
Pt wife calling office, states cancer ctr told them they are to get pt Lupron injections in our office, wife asks does the pt need to have the injection BEFORE his upcoming bone and CT scan 10/07. Please advise. Thanks.

## 2018-05-05 NOTE — Telephone Encounter (Signed)
LMOM to inform patient's wife to cancel Mychart. This is the only way things will stop going into that chart.

## 2018-05-06 NOTE — Telephone Encounter (Signed)
Lupron does not need to be given before the imaging studies.

## 2018-05-09 ENCOUNTER — Encounter
Admission: RE | Admit: 2018-05-09 | Discharge: 2018-05-09 | Disposition: A | Discharge status: Home / Self Care | Payer: PPO | Source: Ambulatory Visit | Attending: Radiation Oncology | Admitting: Radiation Oncology

## 2018-05-09 DIAGNOSIS — Z96652 Presence of left artificial knee joint: Secondary | ICD-10-CM | POA: Insufficient documentation

## 2018-05-09 DIAGNOSIS — M47816 Spondylosis without myelopathy or radiculopathy, lumbar region: Secondary | ICD-10-CM | POA: Insufficient documentation

## 2018-05-09 DIAGNOSIS — C61 Malignant neoplasm of prostate: Secondary | ICD-10-CM | POA: Diagnosis not present

## 2018-05-09 MED ORDER — TECHNETIUM TC 99M MEDRONATE IV KIT
20.0000 | PACK | Freq: Once | INTRAVENOUS | Status: AC | PRN
  Administered 2018-05-09: 23.51 via INTRAVENOUS

## 2018-05-11 ENCOUNTER — Ambulatory Visit
Admission: RE | Admit: 2018-05-11 | Discharge: 2018-05-11 | Disposition: A | Discharge status: Home / Self Care | Payer: PPO | Source: Ambulatory Visit | Attending: Radiation Oncology | Admitting: Radiation Oncology

## 2018-05-11 DIAGNOSIS — Z510 Encounter for antineoplastic radiation therapy: Secondary | ICD-10-CM | POA: Insufficient documentation

## 2018-05-11 DIAGNOSIS — C61 Malignant neoplasm of prostate: Secondary | ICD-10-CM | POA: Diagnosis not present

## 2018-05-12 DIAGNOSIS — C61 Malignant neoplasm of prostate: Secondary | ICD-10-CM | POA: Diagnosis not present

## 2018-05-12 DIAGNOSIS — Z510 Encounter for antineoplastic radiation therapy: Secondary | ICD-10-CM | POA: Diagnosis not present

## 2018-05-13 ENCOUNTER — Telehealth: Payer: Self-pay | Admitting: Urology

## 2018-05-13 NOTE — Telephone Encounter (Signed)
-----   Message from Royanne Foots, [place] sent at 05/13/2018  9:36 AM EDT ----- Regarding: RE: Lupron Inj He can come in anytime for a 6 month lupron thanks ----- Message ----- From: Benard Halsted Sent: 05/04/2018   8:46 AM EDT To: Royanne Foots, CMA Subject: FW: Lupron Inj                                 Can you let me know when he can have his next lupron?   Thanks, Sharyn Lull ----- Message ----- From: Christean Grief, RN Sent: 05/02/2018  12:20 PM EDT To: Ranell Patrick, RN, Benard Halsted Subject: Lupron Inj                                     Please schedule this patient for a Lupron inj.    Thanks

## 2018-05-13 NOTE — Telephone Encounter (Signed)
App made °MB °

## 2018-05-17 ENCOUNTER — Ambulatory Visit (INDEPENDENT_AMBULATORY_CARE_PROVIDER_SITE_OTHER): Payer: PPO

## 2018-05-17 ENCOUNTER — Other Ambulatory Visit: Payer: Self-pay | Admitting: *Deleted

## 2018-05-17 DIAGNOSIS — C61 Malignant neoplasm of prostate: Secondary | ICD-10-CM

## 2018-05-17 MED ORDER — LEUPROLIDE ACETATE (6 MONTH) 45 MG IM KIT
45.0000 mg | PACK | Freq: Once | INTRAMUSCULAR | Status: AC
  Administered 2018-05-17: 45 mg via INTRAMUSCULAR

## 2018-05-17 NOTE — Progress Notes (Signed)
Lupron IM Injection   Due to Prostate Cancer patient is present today for a Lupron Injection.  Medication: Lupron 6 month Dose: 45 mg  Location: left upper outer buttocks Lot: [id number] Exp: 06/26/2020  Patient tolerated well, no complications were noted  Performed by: Fonnie Jarvis, CMA  Follow up: with Dr. Donella Stade

## 2018-05-18 ENCOUNTER — Ambulatory Visit: Payer: PPO

## 2018-05-19 ENCOUNTER — Ambulatory Visit
Admission: RE | Admit: 2018-05-19 | Discharge: 2018-05-19 | Disposition: A | Discharge status: Home / Self Care | Payer: PPO | Source: Ambulatory Visit | Attending: Radiation Oncology | Admitting: Radiation Oncology

## 2018-05-19 ENCOUNTER — Ambulatory Visit: Payer: PPO

## 2018-05-20 ENCOUNTER — Ambulatory Visit: Payer: PPO

## 2018-05-23 ENCOUNTER — Ambulatory Visit
Admission: RE | Admit: 2018-05-23 | Discharge: 2018-05-23 | Disposition: A | Discharge status: Home / Self Care | Payer: PPO | Source: Ambulatory Visit | Attending: Radiation Oncology | Admitting: Radiation Oncology

## 2018-05-23 DIAGNOSIS — C61 Malignant neoplasm of prostate: Secondary | ICD-10-CM

## 2018-05-23 DIAGNOSIS — Z510 Encounter for antineoplastic radiation therapy: Secondary | ICD-10-CM | POA: Diagnosis not present

## 2018-05-23 DIAGNOSIS — M5134 Other intervertebral disc degeneration, thoracic region: Secondary | ICD-10-CM | POA: Diagnosis not present

## 2018-05-23 DIAGNOSIS — M47814 Spondylosis without myelopathy or radiculopathy, thoracic region: Secondary | ICD-10-CM | POA: Insufficient documentation

## 2018-05-23 DIAGNOSIS — D1779 Benign lipomatous neoplasm of other sites: Secondary | ICD-10-CM | POA: Insufficient documentation

## 2018-05-23 MED ORDER — GADOBUTROL 1 MMOL/ML IV SOLN
7.5000 mL | Freq: Once | INTRAVENOUS | Status: AC | PRN
  Administered 2018-05-23: 7.5 mL via INTRAVENOUS

## 2018-05-24 ENCOUNTER — Ambulatory Visit
Admission: RE | Admit: 2018-05-24 | Discharge: 2018-05-24 | Disposition: A | Discharge status: Home / Self Care | Payer: PPO | Source: Ambulatory Visit | Attending: Radiation Oncology | Admitting: Radiation Oncology

## 2018-05-24 DIAGNOSIS — Z510 Encounter for antineoplastic radiation therapy: Secondary | ICD-10-CM | POA: Diagnosis not present

## 2018-05-24 DIAGNOSIS — C61 Malignant neoplasm of prostate: Secondary | ICD-10-CM | POA: Diagnosis not present

## 2018-05-24 LAB — POCT I-STAT CREATININE: Creatinine, Ser: 1.2 mg/dL (ref 0.61–1.24)

## 2018-05-25 ENCOUNTER — Ambulatory Visit
Admission: RE | Admit: 2018-05-25 | Discharge: 2018-05-25 | Disposition: A | Discharge status: Home / Self Care | Payer: PPO | Source: Ambulatory Visit | Attending: Radiation Oncology | Admitting: Radiation Oncology

## 2018-05-25 DIAGNOSIS — C61 Malignant neoplasm of prostate: Secondary | ICD-10-CM | POA: Diagnosis not present

## 2018-05-25 DIAGNOSIS — Z510 Encounter for antineoplastic radiation therapy: Secondary | ICD-10-CM | POA: Diagnosis not present

## 2018-05-26 ENCOUNTER — Ambulatory Visit
Admission: RE | Admit: 2018-05-26 | Discharge: 2018-05-26 | Disposition: A | Discharge status: Home / Self Care | Payer: PPO | Source: Ambulatory Visit | Attending: Radiation Oncology | Admitting: Radiation Oncology

## 2018-05-26 DIAGNOSIS — C61 Malignant neoplasm of prostate: Secondary | ICD-10-CM | POA: Diagnosis not present

## 2018-05-26 DIAGNOSIS — Z510 Encounter for antineoplastic radiation therapy: Secondary | ICD-10-CM | POA: Diagnosis not present

## 2018-05-27 ENCOUNTER — Ambulatory Visit
Admission: RE | Admit: 2018-05-27 | Discharge: 2018-05-27 | Disposition: A | Discharge status: Home / Self Care | Payer: PPO | Source: Ambulatory Visit | Attending: Radiation Oncology | Admitting: Radiation Oncology

## 2018-05-27 DIAGNOSIS — C61 Malignant neoplasm of prostate: Secondary | ICD-10-CM | POA: Diagnosis not present

## 2018-05-27 DIAGNOSIS — Z510 Encounter for antineoplastic radiation therapy: Secondary | ICD-10-CM | POA: Diagnosis not present

## 2018-05-30 ENCOUNTER — Ambulatory Visit
Admission: RE | Admit: 2018-05-30 | Discharge: 2018-05-30 | Disposition: A | Discharge status: Home / Self Care | Payer: PPO | Source: Ambulatory Visit | Attending: Radiation Oncology | Admitting: Radiation Oncology

## 2018-05-30 DIAGNOSIS — C61 Malignant neoplasm of prostate: Secondary | ICD-10-CM | POA: Diagnosis not present

## 2018-05-30 DIAGNOSIS — Z510 Encounter for antineoplastic radiation therapy: Secondary | ICD-10-CM | POA: Diagnosis not present

## 2018-05-31 ENCOUNTER — Ambulatory Visit
Admission: RE | Admit: 2018-05-31 | Discharge: 2018-05-31 | Disposition: A | Discharge status: Home / Self Care | Payer: PPO | Source: Ambulatory Visit | Attending: Radiation Oncology | Admitting: Radiation Oncology

## 2018-05-31 DIAGNOSIS — C61 Malignant neoplasm of prostate: Secondary | ICD-10-CM | POA: Diagnosis not present

## 2018-05-31 DIAGNOSIS — Z510 Encounter for antineoplastic radiation therapy: Secondary | ICD-10-CM | POA: Diagnosis not present

## 2018-06-01 ENCOUNTER — Ambulatory Visit
Admission: RE | Admit: 2018-06-01 | Discharge: 2018-06-01 | Disposition: A | Discharge status: Home / Self Care | Payer: PPO | Source: Ambulatory Visit | Attending: Radiation Oncology | Admitting: Radiation Oncology

## 2018-06-01 DIAGNOSIS — C61 Malignant neoplasm of prostate: Secondary | ICD-10-CM | POA: Diagnosis not present

## 2018-06-01 DIAGNOSIS — Z510 Encounter for antineoplastic radiation therapy: Secondary | ICD-10-CM | POA: Diagnosis not present

## 2018-06-02 ENCOUNTER — Ambulatory Visit
Admission: RE | Admit: 2018-06-02 | Discharge: 2018-06-02 | Disposition: A | Discharge status: Home / Self Care | Payer: PPO | Source: Ambulatory Visit | Attending: Radiation Oncology | Admitting: Radiation Oncology

## 2018-06-02 DIAGNOSIS — Z510 Encounter for antineoplastic radiation therapy: Secondary | ICD-10-CM | POA: Diagnosis not present

## 2018-06-02 DIAGNOSIS — C61 Malignant neoplasm of prostate: Secondary | ICD-10-CM | POA: Diagnosis not present

## 2018-06-03 ENCOUNTER — Ambulatory Visit
Admission: RE | Admit: 2018-06-03 | Discharge: 2018-06-03 | Disposition: A | Discharge status: Home / Self Care | Payer: PPO | Source: Ambulatory Visit | Attending: Radiation Oncology | Admitting: Radiation Oncology

## 2018-06-03 DIAGNOSIS — Z510 Encounter for antineoplastic radiation therapy: Secondary | ICD-10-CM | POA: Diagnosis not present

## 2018-06-03 DIAGNOSIS — C61 Malignant neoplasm of prostate: Secondary | ICD-10-CM | POA: Diagnosis not present

## 2018-06-06 ENCOUNTER — Ambulatory Visit
Admission: RE | Admit: 2018-06-06 | Discharge: 2018-06-06 | Disposition: A | Discharge status: Home / Self Care | Payer: PPO | Source: Ambulatory Visit | Attending: Radiation Oncology | Admitting: Radiation Oncology

## 2018-06-06 DIAGNOSIS — Z510 Encounter for antineoplastic radiation therapy: Secondary | ICD-10-CM | POA: Diagnosis not present

## 2018-06-06 DIAGNOSIS — C61 Malignant neoplasm of prostate: Secondary | ICD-10-CM | POA: Diagnosis not present

## 2018-06-07 ENCOUNTER — Ambulatory Visit
Admission: RE | Admit: 2018-06-07 | Discharge: 2018-06-07 | Disposition: A | Discharge status: Home / Self Care | Payer: PPO | Source: Ambulatory Visit | Attending: Radiation Oncology | Admitting: Radiation Oncology

## 2018-06-07 DIAGNOSIS — Z510 Encounter for antineoplastic radiation therapy: Secondary | ICD-10-CM | POA: Diagnosis not present

## 2018-06-07 DIAGNOSIS — C61 Malignant neoplasm of prostate: Secondary | ICD-10-CM | POA: Diagnosis not present

## 2018-06-08 ENCOUNTER — Ambulatory Visit
Admission: RE | Admit: 2018-06-08 | Discharge: 2018-06-08 | Disposition: A | Discharge status: Home / Self Care | Payer: PPO | Source: Ambulatory Visit | Attending: Radiation Oncology | Admitting: Radiation Oncology

## 2018-06-08 DIAGNOSIS — C61 Malignant neoplasm of prostate: Secondary | ICD-10-CM | POA: Diagnosis not present

## 2018-06-08 DIAGNOSIS — Z510 Encounter for antineoplastic radiation therapy: Secondary | ICD-10-CM | POA: Diagnosis not present

## 2018-06-09 ENCOUNTER — Ambulatory Visit
Admission: RE | Admit: 2018-06-09 | Discharge: 2018-06-09 | Disposition: A | Discharge status: Home / Self Care | Payer: PPO | Source: Ambulatory Visit | Attending: Radiation Oncology | Admitting: Radiation Oncology

## 2018-06-09 DIAGNOSIS — C61 Malignant neoplasm of prostate: Secondary | ICD-10-CM | POA: Diagnosis not present

## 2018-06-09 DIAGNOSIS — Z510 Encounter for antineoplastic radiation therapy: Secondary | ICD-10-CM | POA: Diagnosis not present

## 2018-06-10 ENCOUNTER — Ambulatory Visit
Admission: RE | Admit: 2018-06-10 | Discharge: 2018-06-10 | Disposition: A | Discharge status: Home / Self Care | Payer: PPO | Source: Ambulatory Visit | Attending: Radiation Oncology | Admitting: Radiation Oncology

## 2018-06-10 DIAGNOSIS — Z510 Encounter for antineoplastic radiation therapy: Secondary | ICD-10-CM | POA: Diagnosis not present

## 2018-06-10 DIAGNOSIS — C61 Malignant neoplasm of prostate: Secondary | ICD-10-CM | POA: Diagnosis not present

## 2018-06-13 ENCOUNTER — Ambulatory Visit
Admission: RE | Admit: 2018-06-13 | Discharge: 2018-06-13 | Disposition: A | Discharge status: Home / Self Care | Payer: PPO | Source: Ambulatory Visit | Attending: Radiation Oncology | Admitting: Radiation Oncology

## 2018-06-13 DIAGNOSIS — C61 Malignant neoplasm of prostate: Secondary | ICD-10-CM | POA: Diagnosis not present

## 2018-06-13 DIAGNOSIS — Z510 Encounter for antineoplastic radiation therapy: Secondary | ICD-10-CM | POA: Diagnosis not present

## 2018-06-14 ENCOUNTER — Ambulatory Visit
Admission: RE | Admit: 2018-06-14 | Discharge: 2018-06-14 | Disposition: A | Discharge status: Home / Self Care | Payer: PPO | Source: Ambulatory Visit | Attending: Radiation Oncology | Admitting: Radiation Oncology

## 2018-06-14 DIAGNOSIS — R159 Full incontinence of feces: Secondary | ICD-10-CM | POA: Diagnosis not present

## 2018-06-14 DIAGNOSIS — I429 Cardiomyopathy, unspecified: Secondary | ICD-10-CM | POA: Diagnosis not present

## 2018-06-14 DIAGNOSIS — Z6836 Body mass index (BMI) 36.0-36.9, adult: Secondary | ICD-10-CM | POA: Diagnosis not present

## 2018-06-14 DIAGNOSIS — D649 Anemia, unspecified: Secondary | ICD-10-CM | POA: Diagnosis not present

## 2018-06-14 DIAGNOSIS — Z1389 Encounter for screening for other disorder: Secondary | ICD-10-CM | POA: Diagnosis not present

## 2018-06-14 DIAGNOSIS — Z0000 Encounter for general adult medical examination without abnormal findings: Secondary | ICD-10-CM | POA: Diagnosis not present

## 2018-06-14 DIAGNOSIS — I480 Paroxysmal atrial fibrillation: Secondary | ICD-10-CM | POA: Diagnosis not present

## 2018-06-14 DIAGNOSIS — C61 Malignant neoplasm of prostate: Secondary | ICD-10-CM | POA: Diagnosis not present

## 2018-06-14 DIAGNOSIS — M1990 Unspecified osteoarthritis, unspecified site: Secondary | ICD-10-CM | POA: Diagnosis not present

## 2018-06-14 DIAGNOSIS — I110 Hypertensive heart disease with heart failure: Secondary | ICD-10-CM | POA: Diagnosis not present

## 2018-06-14 DIAGNOSIS — Z510 Encounter for antineoplastic radiation therapy: Secondary | ICD-10-CM | POA: Diagnosis not present

## 2018-06-14 DIAGNOSIS — R413 Other amnesia: Secondary | ICD-10-CM | POA: Diagnosis not present

## 2018-06-14 DIAGNOSIS — E7849 Other hyperlipidemia: Secondary | ICD-10-CM | POA: Diagnosis not present

## 2018-06-15 ENCOUNTER — Ambulatory Visit
Admission: RE | Admit: 2018-06-15 | Discharge: 2018-06-15 | Disposition: A | Discharge status: Home / Self Care | Payer: PPO | Source: Ambulatory Visit | Attending: Radiation Oncology | Admitting: Radiation Oncology

## 2018-06-15 DIAGNOSIS — Z510 Encounter for antineoplastic radiation therapy: Secondary | ICD-10-CM | POA: Diagnosis not present

## 2018-06-15 DIAGNOSIS — C61 Malignant neoplasm of prostate: Secondary | ICD-10-CM | POA: Diagnosis not present

## 2018-06-16 ENCOUNTER — Ambulatory Visit
Admission: RE | Admit: 2018-06-16 | Discharge: 2018-06-16 | Disposition: A | Discharge status: Home / Self Care | Payer: PPO | Source: Ambulatory Visit | Attending: Radiation Oncology | Admitting: Radiation Oncology

## 2018-06-16 DIAGNOSIS — Z510 Encounter for antineoplastic radiation therapy: Secondary | ICD-10-CM | POA: Diagnosis not present

## 2018-06-16 DIAGNOSIS — C61 Malignant neoplasm of prostate: Secondary | ICD-10-CM | POA: Diagnosis not present

## 2018-06-17 ENCOUNTER — Ambulatory Visit
Admission: RE | Admit: 2018-06-17 | Discharge: 2018-06-17 | Disposition: A | Discharge status: Home / Self Care | Payer: PPO | Source: Ambulatory Visit | Attending: Radiation Oncology | Admitting: Radiation Oncology

## 2018-06-17 DIAGNOSIS — Z510 Encounter for antineoplastic radiation therapy: Secondary | ICD-10-CM | POA: Diagnosis not present

## 2018-06-17 DIAGNOSIS — C61 Malignant neoplasm of prostate: Secondary | ICD-10-CM | POA: Diagnosis not present

## 2018-06-20 ENCOUNTER — Ambulatory Visit
Admission: RE | Admit: 2018-06-20 | Discharge: 2018-06-20 | Disposition: A | Discharge status: Home / Self Care | Payer: PPO | Source: Ambulatory Visit | Attending: Radiation Oncology | Admitting: Radiation Oncology

## 2018-06-20 DIAGNOSIS — Z510 Encounter for antineoplastic radiation therapy: Secondary | ICD-10-CM | POA: Diagnosis not present

## 2018-06-20 DIAGNOSIS — C61 Malignant neoplasm of prostate: Secondary | ICD-10-CM | POA: Diagnosis not present

## 2018-06-21 ENCOUNTER — Ambulatory Visit
Admission: RE | Admit: 2018-06-21 | Discharge: 2018-06-21 | Disposition: A | Discharge status: Home / Self Care | Payer: PPO | Source: Ambulatory Visit | Attending: Radiation Oncology | Admitting: Radiation Oncology

## 2018-06-21 DIAGNOSIS — Z510 Encounter for antineoplastic radiation therapy: Secondary | ICD-10-CM | POA: Diagnosis not present

## 2018-06-21 DIAGNOSIS — C61 Malignant neoplasm of prostate: Secondary | ICD-10-CM | POA: Diagnosis not present

## 2018-06-22 ENCOUNTER — Ambulatory Visit
Admission: RE | Admit: 2018-06-22 | Discharge: 2018-06-22 | Disposition: A | Discharge status: Home / Self Care | Payer: PPO | Source: Ambulatory Visit | Attending: Radiation Oncology | Admitting: Radiation Oncology

## 2018-06-22 DIAGNOSIS — Z510 Encounter for antineoplastic radiation therapy: Secondary | ICD-10-CM | POA: Diagnosis not present

## 2018-06-22 DIAGNOSIS — C61 Malignant neoplasm of prostate: Secondary | ICD-10-CM | POA: Diagnosis not present

## 2018-06-23 ENCOUNTER — Ambulatory Visit
Admission: RE | Admit: 2018-06-23 | Discharge: 2018-06-23 | Disposition: A | Discharge status: Home / Self Care | Payer: PPO | Source: Ambulatory Visit | Attending: Radiation Oncology | Admitting: Radiation Oncology

## 2018-06-23 DIAGNOSIS — Z510 Encounter for antineoplastic radiation therapy: Secondary | ICD-10-CM | POA: Diagnosis not present

## 2018-06-23 DIAGNOSIS — C61 Malignant neoplasm of prostate: Secondary | ICD-10-CM | POA: Diagnosis not present

## 2018-06-24 ENCOUNTER — Ambulatory Visit
Admission: RE | Admit: 2018-06-24 | Discharge: 2018-06-24 | Disposition: A | Discharge status: Home / Self Care | Payer: PPO | Source: Ambulatory Visit | Attending: Radiation Oncology | Admitting: Radiation Oncology

## 2018-06-24 DIAGNOSIS — Z510 Encounter for antineoplastic radiation therapy: Secondary | ICD-10-CM | POA: Diagnosis not present

## 2018-06-24 DIAGNOSIS — C61 Malignant neoplasm of prostate: Secondary | ICD-10-CM | POA: Diagnosis not present

## 2018-06-27 ENCOUNTER — Ambulatory Visit
Admission: RE | Admit: 2018-06-27 | Discharge: 2018-06-27 | Disposition: A | Discharge status: Home / Self Care | Payer: PPO | Source: Ambulatory Visit | Attending: Radiation Oncology | Admitting: Radiation Oncology

## 2018-06-27 DIAGNOSIS — Z510 Encounter for antineoplastic radiation therapy: Secondary | ICD-10-CM | POA: Diagnosis not present

## 2018-06-27 DIAGNOSIS — C61 Malignant neoplasm of prostate: Secondary | ICD-10-CM | POA: Diagnosis not present

## 2018-06-28 ENCOUNTER — Ambulatory Visit
Admission: RE | Admit: 2018-06-28 | Discharge: 2018-06-28 | Disposition: A | Discharge status: Home / Self Care | Payer: PPO | Source: Ambulatory Visit | Attending: Radiation Oncology | Admitting: Radiation Oncology

## 2018-06-28 DIAGNOSIS — Z510 Encounter for antineoplastic radiation therapy: Secondary | ICD-10-CM | POA: Diagnosis not present

## 2018-06-28 DIAGNOSIS — C61 Malignant neoplasm of prostate: Secondary | ICD-10-CM | POA: Diagnosis not present

## 2018-06-29 ENCOUNTER — Ambulatory Visit
Admission: RE | Admit: 2018-06-29 | Discharge: 2018-06-29 | Disposition: A | Discharge status: Home / Self Care | Payer: PPO | Source: Ambulatory Visit | Attending: Radiation Oncology | Admitting: Radiation Oncology

## 2018-06-29 DIAGNOSIS — C61 Malignant neoplasm of prostate: Secondary | ICD-10-CM | POA: Diagnosis not present

## 2018-06-29 DIAGNOSIS — Z510 Encounter for antineoplastic radiation therapy: Secondary | ICD-10-CM | POA: Diagnosis not present

## 2018-07-04 ENCOUNTER — Ambulatory Visit
Admission: RE | Admit: 2018-07-04 | Discharge: 2018-07-04 | Disposition: A | Discharge status: Home / Self Care | Payer: PPO | Source: Ambulatory Visit | Attending: Radiation Oncology | Admitting: Radiation Oncology

## 2018-07-04 DIAGNOSIS — Z510 Encounter for antineoplastic radiation therapy: Secondary | ICD-10-CM | POA: Insufficient documentation

## 2018-07-04 DIAGNOSIS — C61 Malignant neoplasm of prostate: Secondary | ICD-10-CM | POA: Diagnosis not present

## 2018-07-05 ENCOUNTER — Ambulatory Visit
Admission: RE | Admit: 2018-07-05 | Discharge: 2018-07-05 | Disposition: A | Discharge status: Home / Self Care | Payer: PPO | Source: Ambulatory Visit | Attending: Radiation Oncology | Admitting: Radiation Oncology

## 2018-07-05 DIAGNOSIS — Z510 Encounter for antineoplastic radiation therapy: Secondary | ICD-10-CM | POA: Diagnosis not present

## 2018-07-05 DIAGNOSIS — C61 Malignant neoplasm of prostate: Secondary | ICD-10-CM | POA: Diagnosis not present

## 2018-07-06 ENCOUNTER — Ambulatory Visit
Admission: RE | Admit: 2018-07-06 | Discharge: 2018-07-06 | Disposition: A | Discharge status: Home / Self Care | Payer: PPO | Source: Ambulatory Visit | Attending: Radiation Oncology | Admitting: Radiation Oncology

## 2018-07-06 DIAGNOSIS — Z510 Encounter for antineoplastic radiation therapy: Secondary | ICD-10-CM | POA: Diagnosis not present

## 2018-07-06 DIAGNOSIS — C61 Malignant neoplasm of prostate: Secondary | ICD-10-CM | POA: Diagnosis not present

## 2018-07-07 ENCOUNTER — Ambulatory Visit
Admission: RE | Admit: 2018-07-07 | Discharge: 2018-07-07 | Disposition: A | Discharge status: Home / Self Care | Payer: PPO | Source: Ambulatory Visit | Attending: Radiation Oncology | Admitting: Radiation Oncology

## 2018-07-07 DIAGNOSIS — Z510 Encounter for antineoplastic radiation therapy: Secondary | ICD-10-CM | POA: Diagnosis not present

## 2018-07-07 DIAGNOSIS — C61 Malignant neoplasm of prostate: Secondary | ICD-10-CM | POA: Diagnosis not present

## 2018-07-08 ENCOUNTER — Ambulatory Visit
Admission: RE | Admit: 2018-07-08 | Discharge: 2018-07-08 | Disposition: A | Discharge status: Home / Self Care | Payer: PPO | Source: Ambulatory Visit | Attending: Radiation Oncology | Admitting: Radiation Oncology

## 2018-07-08 DIAGNOSIS — Z510 Encounter for antineoplastic radiation therapy: Secondary | ICD-10-CM | POA: Diagnosis not present

## 2018-07-08 DIAGNOSIS — C61 Malignant neoplasm of prostate: Secondary | ICD-10-CM | POA: Diagnosis not present

## 2018-07-09 ENCOUNTER — Ambulatory Visit: Payer: PPO

## 2018-07-11 ENCOUNTER — Ambulatory Visit
Admission: RE | Admit: 2018-07-11 | Discharge: 2018-07-11 | Disposition: A | Discharge status: Home / Self Care | Payer: PPO | Source: Ambulatory Visit | Attending: Radiation Oncology | Admitting: Radiation Oncology

## 2018-07-11 DIAGNOSIS — Z510 Encounter for antineoplastic radiation therapy: Secondary | ICD-10-CM | POA: Diagnosis not present

## 2018-07-11 DIAGNOSIS — C61 Malignant neoplasm of prostate: Secondary | ICD-10-CM | POA: Diagnosis not present

## 2018-07-12 ENCOUNTER — Ambulatory Visit
Admission: RE | Admit: 2018-07-12 | Discharge: 2018-07-12 | Disposition: A | Discharge status: Home / Self Care | Payer: PPO | Source: Ambulatory Visit | Attending: Radiation Oncology | Admitting: Radiation Oncology

## 2018-07-12 DIAGNOSIS — C61 Malignant neoplasm of prostate: Secondary | ICD-10-CM | POA: Diagnosis not present

## 2018-07-12 DIAGNOSIS — Z510 Encounter for antineoplastic radiation therapy: Secondary | ICD-10-CM | POA: Diagnosis not present

## 2018-07-13 ENCOUNTER — Ambulatory Visit
Admission: RE | Admit: 2018-07-13 | Discharge: 2018-07-13 | Disposition: A | Discharge status: Home / Self Care | Payer: PPO | Source: Ambulatory Visit | Attending: Radiation Oncology | Admitting: Radiation Oncology

## 2018-07-13 DIAGNOSIS — Z510 Encounter for antineoplastic radiation therapy: Secondary | ICD-10-CM | POA: Diagnosis not present

## 2018-07-13 DIAGNOSIS — C61 Malignant neoplasm of prostate: Secondary | ICD-10-CM | POA: Diagnosis not present

## 2018-07-14 ENCOUNTER — Ambulatory Visit
Admission: RE | Admit: 2018-07-14 | Discharge: 2018-07-14 | Disposition: A | Discharge status: Home / Self Care | Payer: PPO | Source: Ambulatory Visit | Attending: Radiation Oncology | Admitting: Radiation Oncology

## 2018-07-14 DIAGNOSIS — Z510 Encounter for antineoplastic radiation therapy: Secondary | ICD-10-CM | POA: Diagnosis not present

## 2018-07-14 DIAGNOSIS — C61 Malignant neoplasm of prostate: Secondary | ICD-10-CM | POA: Diagnosis not present

## 2018-07-15 ENCOUNTER — Ambulatory Visit
Admission: RE | Admit: 2018-07-15 | Discharge: 2018-07-15 | Disposition: A | Discharge status: Home / Self Care | Payer: PPO | Source: Ambulatory Visit | Attending: Radiation Oncology | Admitting: Radiation Oncology

## 2018-07-15 DIAGNOSIS — Z510 Encounter for antineoplastic radiation therapy: Secondary | ICD-10-CM | POA: Diagnosis not present

## 2018-07-15 DIAGNOSIS — C61 Malignant neoplasm of prostate: Secondary | ICD-10-CM | POA: Diagnosis not present

## 2018-07-18 ENCOUNTER — Ambulatory Visit
Admission: RE | Admit: 2018-07-18 | Discharge: 2018-07-18 | Disposition: A | Discharge status: Home / Self Care | Payer: PPO | Source: Ambulatory Visit | Attending: Radiation Oncology | Admitting: Radiation Oncology

## 2018-07-18 DIAGNOSIS — Z510 Encounter for antineoplastic radiation therapy: Secondary | ICD-10-CM | POA: Diagnosis not present

## 2018-07-18 DIAGNOSIS — C61 Malignant neoplasm of prostate: Secondary | ICD-10-CM | POA: Diagnosis not present

## 2018-07-19 ENCOUNTER — Ambulatory Visit
Admission: RE | Admit: 2018-07-19 | Discharge: 2018-07-19 | Disposition: A | Discharge status: Home / Self Care | Payer: PPO | Source: Ambulatory Visit | Attending: Radiation Oncology | Admitting: Radiation Oncology

## 2018-07-19 DIAGNOSIS — C61 Malignant neoplasm of prostate: Secondary | ICD-10-CM | POA: Diagnosis not present

## 2018-07-19 DIAGNOSIS — Z510 Encounter for antineoplastic radiation therapy: Secondary | ICD-10-CM | POA: Diagnosis not present

## 2018-08-22 ENCOUNTER — Ambulatory Visit: Payer: PPO | Admitting: Radiation Oncology

## 2018-08-23 ENCOUNTER — Other Ambulatory Visit: Payer: Self-pay

## 2018-08-23 NOTE — Patient Outreach (Addendum)
[facility]) Care Management  08/23/2018  RYKEN PASCHAL 06/02/1941 [id number]   Referral Date: 08/23/2018 Referral Source: [facility] Concierge Referral Reason: Assistance with paying co-insurance for cancer treatment   Outreach Attempt: No answer.  HIPAA compliant voice message left.  Plan: RN CM will attempt again within 3 business days and send letter.  Update:  Incoming call from wife.  She is able to verify HIPAA.  Discussed reason for referral.  Advised her that the cancer center sometimes have resources to help with payment.  She states she has talked with the hospital and that bill has to be at least $5000 for assistance.  Advised her that there are no resources for [employer].  She verbalized understanding.     Plan: RN CM will close case.      Jone Baseman, RN, MSN [facility] Care Management Care Management Coordinator Direct Line [phone] Toll Free: [phone]  Fax: [phone]

## 2018-08-25 ENCOUNTER — Other Ambulatory Visit: Payer: Self-pay

## 2018-08-25 ENCOUNTER — Ambulatory Visit
Admission: RE | Admit: 2018-08-25 | Discharge: 2018-08-25 | Disposition: A | Discharge status: Home / Self Care | Payer: PPO | Source: Ambulatory Visit | Attending: Radiation Oncology | Admitting: Radiation Oncology

## 2018-08-25 ENCOUNTER — Other Ambulatory Visit: Payer: Self-pay | Admitting: *Deleted

## 2018-08-25 ENCOUNTER — Encounter: Payer: Self-pay | Admitting: Radiation Oncology

## 2018-08-25 VITALS — BP 109/68 | HR 83 | Temp 98.4°F | Resp 18 | Wt 203.9 lb

## 2018-08-25 DIAGNOSIS — C61 Malignant neoplasm of prostate: Secondary | ICD-10-CM | POA: Diagnosis not present

## 2018-08-25 DIAGNOSIS — R3915 Urgency of urination: Secondary | ICD-10-CM | POA: Insufficient documentation

## 2018-08-25 DIAGNOSIS — R351 Nocturia: Secondary | ICD-10-CM | POA: Diagnosis not present

## 2018-08-25 DIAGNOSIS — N529 Male erectile dysfunction, unspecified: Secondary | ICD-10-CM | POA: Insufficient documentation

## 2018-08-25 DIAGNOSIS — Z923 Personal history of irradiation: Secondary | ICD-10-CM | POA: Diagnosis not present

## 2018-08-25 DIAGNOSIS — R350 Frequency of micturition: Secondary | ICD-10-CM | POA: Insufficient documentation

## 2018-08-25 MED ORDER — TAMSULOSIN HCL 0.4 MG PO CAPS: 0.4000 mg | ORAL_CAPSULE | Freq: Every day | ORAL | 6 refills | Status: DC

## 2018-08-25 NOTE — Addendum Note (Signed)
Encounter addended by: Daiva Huge, RN on: 08/25/2018 4:13 PM  Actions taken: Order Reconciliation Section accessed, Home Medications modified

## 2018-08-25 NOTE — Progress Notes (Signed)
Radiation Oncology Follow up Note  Name: Jim Dodson   Date:   08/25/2018 MRN:  [id number] DOB: 1941-07-16    This 78 y.o. male presents to the clinic today for one-month follow-up status post salvage radiation therapy after cryotherapy with the patient with stage IIIB adenocarcinoma the prostate.  REFERRING PROVIDER: Shon Baton, MD  HPI: patient is a 79 year old male now out 1 month having completed prostate radiation therapy to both prostate and pelvic nodes.for a Gleason 7 (4+3) after undergoing cryotherapy at [facility] back in 2014. Have biochemical failure plus MRI's showing focus of enhancing soft tissue worrisome for residual tumor. Patient does have long-standing issues with urinary frequency and urgency as well as erectile dysfunction. He underwent salvage treatment with androgen deprivation therapy now seen out 1 month. He states he still having urinary frequency and urgency nocturia 2-3. He's also having some frequent bowel movements not specifically diarrhea does not use Imodium does not follow any low residue diet.  COMPLICATIONS OF TREATMENT: none  FOLLOW UP COMPLIANCE: keeps appointments   PHYSICAL EXAM:  BP 109/68   Pulse 83   Temp 98.4 F (36.9 C)   Resp 18   Wt 203 lb 14.8 oz (92.5 kg)   BMI 33.93 kg/m  Well-developed well-nourished patient in NAD. HEENT reveals PERLA, EOMI, discs not visualized.  Oral cavity is clear. No oral mucosal lesions are identified. Neck is clear without evidence of cervical or supraclavicular adenopathy. Lungs are clear to A&P. Cardiac examination is essentially unremarkable with regular rate and rhythm without murmur rub or thrill. Abdomen is benign with no organomegaly or masses noted. Motor sensory and DTR levels are equal and symmetric in the upper and lower extremities. Cranial nerves II through XII are grossly intact. Proprioception is intact. No peripheral adenopathy or edema is identified. No motor or sensory levels are noted. Crude visual  fields are within normal range.  RADIOLOGY RESULTS: no current films for review  PLAN: present time patient is doing fairly well. Still has residual problems with lower urinary tract symptoms which precedes her radiation treatments. I'm starting him on a low residue diet and suggested some Imodium when necessary for his loose bowel movements. I've alsowill start him on Flomax to be taken half an hour after dinner each night. I've set up a follow-up appointment in 3-4 months with a PSA prior to that visit. Patient knows to call sooner with any concerns.  I would like to take this opportunity to thank you for allowing me to participate in the care of your patient.Noreene Filbert, MD

## 2018-09-01 DIAGNOSIS — H2513 Age-related nuclear cataract, bilateral: Secondary | ICD-10-CM | POA: Diagnosis not present

## 2018-10-11 DIAGNOSIS — E782 Mixed hyperlipidemia: Secondary | ICD-10-CM | POA: Diagnosis not present

## 2018-10-11 DIAGNOSIS — I4891 Unspecified atrial fibrillation: Secondary | ICD-10-CM | POA: Diagnosis not present

## 2018-10-11 DIAGNOSIS — I10 Essential (primary) hypertension: Secondary | ICD-10-CM | POA: Diagnosis not present

## 2018-11-02 DIAGNOSIS — Z8719 Personal history of other diseases of the digestive system: Secondary | ICD-10-CM | POA: Diagnosis not present

## 2018-11-02 DIAGNOSIS — L918 Other hypertrophic disorders of the skin: Secondary | ICD-10-CM | POA: Diagnosis not present

## 2018-11-02 DIAGNOSIS — L814 Other melanin hyperpigmentation: Secondary | ICD-10-CM | POA: Diagnosis not present

## 2018-11-02 DIAGNOSIS — M25561 Pain in right knee: Secondary | ICD-10-CM | POA: Diagnosis not present

## 2018-11-02 DIAGNOSIS — I480 Paroxysmal atrial fibrillation: Secondary | ICD-10-CM | POA: Diagnosis not present

## 2018-11-02 DIAGNOSIS — A048 Other specified bacterial intestinal infections: Secondary | ICD-10-CM | POA: Diagnosis not present

## 2018-11-02 DIAGNOSIS — L919 Hypertrophic disorder of the skin, unspecified: Secondary | ICD-10-CM | POA: Diagnosis not present

## 2018-11-02 DIAGNOSIS — G8929 Other chronic pain: Secondary | ICD-10-CM | POA: Diagnosis not present

## 2018-11-02 DIAGNOSIS — I5043 Acute on chronic combined systolic (congestive) and diastolic (congestive) heart failure: Secondary | ICD-10-CM | POA: Diagnosis not present

## 2018-11-02 DIAGNOSIS — D229 Melanocytic nevi, unspecified: Secondary | ICD-10-CM | POA: Diagnosis not present

## 2018-11-02 DIAGNOSIS — I8393 Asymptomatic varicose veins of bilateral lower extremities: Secondary | ICD-10-CM | POA: Diagnosis not present

## 2018-11-02 DIAGNOSIS — L578 Other skin changes due to chronic exposure to nonionizing radiation: Secondary | ICD-10-CM | POA: Diagnosis not present

## 2018-11-02 DIAGNOSIS — L821 Other seborrheic keratosis: Secondary | ICD-10-CM | POA: Diagnosis not present

## 2018-11-02 DIAGNOSIS — Z86010 Personal history of colonic polyps: Secondary | ICD-10-CM | POA: Diagnosis not present

## 2018-11-02 DIAGNOSIS — M25562 Pain in left knee: Secondary | ICD-10-CM | POA: Diagnosis not present

## 2018-11-02 DIAGNOSIS — L570 Actinic keratosis: Secondary | ICD-10-CM | POA: Diagnosis not present

## 2018-11-02 DIAGNOSIS — S41101S Unspecified open wound of right upper arm, sequela: Secondary | ICD-10-CM | POA: Diagnosis not present

## 2018-11-02 DIAGNOSIS — D1800 Hemangioma unspecified site: Secondary | ICD-10-CM | POA: Diagnosis not present

## 2018-11-02 DIAGNOSIS — Z7901 Long term (current) use of anticoagulants: Secondary | ICD-10-CM | POA: Diagnosis not present

## 2018-11-02 DIAGNOSIS — Z1283 Encounter for screening for malignant neoplasm of skin: Secondary | ICD-10-CM | POA: Diagnosis not present

## 2018-11-02 DIAGNOSIS — L820 Inflamed seborrheic keratosis: Secondary | ICD-10-CM | POA: Diagnosis not present

## 2018-11-02 DIAGNOSIS — I130 Hypertensive heart and chronic kidney disease with heart failure and stage 1 through stage 4 chronic kidney disease, or unspecified chronic kidney disease: Secondary | ICD-10-CM | POA: Diagnosis not present

## 2018-11-02 DIAGNOSIS — Z4800 Encounter for change or removal of nonsurgical wound dressing: Secondary | ICD-10-CM | POA: Diagnosis not present

## 2018-11-02 DIAGNOSIS — K921 Melena: Secondary | ICD-10-CM | POA: Diagnosis not present

## 2018-11-02 DIAGNOSIS — N182 Chronic kidney disease, stage 2 (mild): Secondary | ICD-10-CM | POA: Diagnosis not present

## 2018-11-02 DIAGNOSIS — I781 Nevus, non-neoplastic: Secondary | ICD-10-CM | POA: Diagnosis not present

## 2018-12-06 DIAGNOSIS — R82998 Other abnormal findings in urine: Secondary | ICD-10-CM | POA: Diagnosis not present

## 2018-12-06 DIAGNOSIS — I10 Essential (primary) hypertension: Secondary | ICD-10-CM | POA: Diagnosis not present

## 2018-12-06 DIAGNOSIS — E7849 Other hyperlipidemia: Secondary | ICD-10-CM | POA: Diagnosis not present

## 2018-12-06 DIAGNOSIS — E559 Vitamin D deficiency, unspecified: Secondary | ICD-10-CM | POA: Diagnosis not present

## 2018-12-06 DIAGNOSIS — Z125 Encounter for screening for malignant neoplasm of prostate: Secondary | ICD-10-CM | POA: Diagnosis not present

## 2018-12-06 DIAGNOSIS — R739 Hyperglycemia, unspecified: Secondary | ICD-10-CM | POA: Diagnosis not present

## 2018-12-08 ENCOUNTER — Inpatient Hospital Stay: Payer: PPO | Attending: Radiation Oncology

## 2018-12-08 ENCOUNTER — Other Ambulatory Visit: Payer: Self-pay

## 2018-12-08 DIAGNOSIS — R9721 Rising PSA following treatment for malignant neoplasm of prostate: Secondary | ICD-10-CM | POA: Insufficient documentation

## 2018-12-08 DIAGNOSIS — C61 Malignant neoplasm of prostate: Secondary | ICD-10-CM | POA: Insufficient documentation

## 2018-12-08 LAB — PSA: Prostatic Specific Antigen: 0.04 ng/mL (ref 0.00–4.00)

## 2018-12-13 DIAGNOSIS — H04129 Dry eye syndrome of unspecified lacrimal gland: Secondary | ICD-10-CM | POA: Diagnosis not present

## 2018-12-13 DIAGNOSIS — R739 Hyperglycemia, unspecified: Secondary | ICD-10-CM | POA: Diagnosis not present

## 2018-12-13 DIAGNOSIS — E785 Hyperlipidemia, unspecified: Secondary | ICD-10-CM | POA: Diagnosis not present

## 2018-12-13 DIAGNOSIS — Z1339 Encounter for screening examination for other mental health and behavioral disorders: Secondary | ICD-10-CM | POA: Diagnosis not present

## 2018-12-13 DIAGNOSIS — I110 Hypertensive heart disease with heart failure: Secondary | ICD-10-CM | POA: Diagnosis not present

## 2018-12-13 DIAGNOSIS — I2510 Atherosclerotic heart disease of native coronary artery without angina pectoris: Secondary | ICD-10-CM | POA: Diagnosis not present

## 2018-12-13 DIAGNOSIS — D649 Anemia, unspecified: Secondary | ICD-10-CM | POA: Diagnosis not present

## 2018-12-13 DIAGNOSIS — Z1331 Encounter for screening for depression: Secondary | ICD-10-CM | POA: Diagnosis not present

## 2018-12-13 DIAGNOSIS — Z0000 Encounter for general adult medical examination without abnormal findings: Secondary | ICD-10-CM | POA: Diagnosis not present

## 2018-12-13 DIAGNOSIS — D696 Thrombocytopenia, unspecified: Secondary | ICD-10-CM | POA: Diagnosis not present

## 2018-12-13 DIAGNOSIS — R413 Other amnesia: Secondary | ICD-10-CM | POA: Diagnosis not present

## 2018-12-13 DIAGNOSIS — C61 Malignant neoplasm of prostate: Secondary | ICD-10-CM | POA: Diagnosis not present

## 2018-12-13 DIAGNOSIS — E669 Obesity, unspecified: Secondary | ICD-10-CM | POA: Diagnosis not present

## 2018-12-13 DIAGNOSIS — I480 Paroxysmal atrial fibrillation: Secondary | ICD-10-CM | POA: Diagnosis not present

## 2018-12-15 ENCOUNTER — Other Ambulatory Visit: Payer: Self-pay | Admitting: *Deleted

## 2018-12-15 ENCOUNTER — Ambulatory Visit: Payer: PPO | Admitting: Radiation Oncology

## 2018-12-15 ENCOUNTER — Encounter: Payer: Self-pay | Admitting: Radiation Oncology

## 2018-12-15 ENCOUNTER — Ambulatory Visit
Admission: RE | Admit: 2018-12-15 | Discharge: 2018-12-15 | Disposition: A | Discharge status: Home / Self Care | Payer: PPO | Source: Ambulatory Visit | Attending: Radiation Oncology | Admitting: Radiation Oncology

## 2018-12-15 ENCOUNTER — Other Ambulatory Visit: Payer: Self-pay

## 2018-12-15 VITALS — BP 130/83 | HR 66 | Temp 97.8°F | Resp 18 | Wt 211.8 lb

## 2018-12-15 DIAGNOSIS — Z923 Personal history of irradiation: Secondary | ICD-10-CM | POA: Diagnosis not present

## 2018-12-15 DIAGNOSIS — R351 Nocturia: Secondary | ICD-10-CM | POA: Diagnosis not present

## 2018-12-15 DIAGNOSIS — C61 Malignant neoplasm of prostate: Secondary | ICD-10-CM

## 2018-12-15 DIAGNOSIS — R3915 Urgency of urination: Secondary | ICD-10-CM | POA: Diagnosis not present

## 2018-12-15 MED ORDER — TAMSULOSIN HCL 0.4 MG PO CAPS: 0.4000 mg | ORAL_CAPSULE | Freq: Two times a day (BID) | ORAL | 11 refills | Status: DC

## 2018-12-15 NOTE — Progress Notes (Signed)
Radiation Oncology Follow up Note  Name: Jim Dodson   Date:   12/15/2018 MRN:  [id number] DOB: 10/22/1940    This 78 y.o. male presents to the clinic today for 18-month follow-up status post salvage radiation therapy after cryotherapy the patient with known stage IIIb adenocarcinoma the prostate.  REFERRING PROVIDER: Shon Baton, MD  HPI: Patient is a 78 year old male now out 4 months having completed salvage radiation therapy after cryotherapy for Gleason 7 (4+3) adenocarcinoma the prostate treated back in 2014 with cryotherapy at [facility].  Seen today in routine follow-up he is doing well.  He does have urinary frequency some urgency and nocturia x5.  He is isolated on Flomax is taking 2 tablets after dinner.  Of asked him to start taking that once in the morning and once after dinner.  His most recent PSA was 0.04 showing excellent biochemical control..  COMPLICATIONS OF TREATMENT: none  FOLLOW UP COMPLIANCE: keeps appointments   PHYSICAL EXAM:  BP 130/83 (BP Location: Left Arm, Patient Position: Sitting)   Pulse 66   Temp 97.8 F (36.6 C) (Tympanic)   Resp 18   Wt 211 lb 12 oz (96.1 kg)   BMI 35.24 kg/m  Well-developed well-nourished patient in NAD. HEENT reveals PERLA, EOMI, discs not visualized.  Oral cavity is clear. No oral mucosal lesions are identified. Neck is clear without evidence of cervical or supraclavicular adenopathy. Lungs are clear to A&P. Cardiac examination is essentially unremarkable with regular rate and rhythm without murmur rub or thrill. Abdomen is benign with no organomegaly or masses noted. Motor sensory and DTR levels are equal and symmetric in the upper and lower extremities. Cranial nerves II through XII are grossly intact. Proprioception is intact. No peripheral adenopathy or edema is identified. No motor or sensory levels are noted. Crude visual fields are within normal range.  RADIOLOGY RESULTS: No current films for review  PLAN: Present time patient is  doing well under excellent biochemical control of his prostate cancer.  I have again asked him to start taking 1 tab in the morning 1 tab after dinner of Flomax.  Otherwise I have asked to see him back in 6 months for follow-up with a PSA at that time.  Patient knows to call with any concerns.  I would like to take this opportunity to thank you for allowing me to participate in the care of your patient.Noreene Filbert, MD

## 2018-12-28 DIAGNOSIS — D649 Anemia, unspecified: Secondary | ICD-10-CM | POA: Diagnosis not present

## 2019-01-10 DIAGNOSIS — D509 Iron deficiency anemia, unspecified: Secondary | ICD-10-CM | POA: Diagnosis not present

## 2019-01-24 ENCOUNTER — Other Ambulatory Visit
Admission: RE | Admit: 2019-01-24 | Discharge: 2019-01-24 | Disposition: A | Discharge status: Home / Self Care | Payer: PPO | Source: Ambulatory Visit | Attending: Unknown Physician Specialty | Admitting: Unknown Physician Specialty

## 2019-01-24 ENCOUNTER — Other Ambulatory Visit: Payer: Self-pay

## 2019-01-24 DIAGNOSIS — Z1159 Encounter for screening for other viral diseases: Secondary | ICD-10-CM | POA: Insufficient documentation

## 2019-01-26 LAB — NOVEL CORONAVIRUS, NAA (HOSP ORDER, SEND-OUT TO REF LAB; TAT 18-24 HRS): SARS-CoV-2, NAA: NOT DETECTED

## 2019-01-27 ENCOUNTER — Other Ambulatory Visit: Payer: Self-pay

## 2019-01-27 ENCOUNTER — Ambulatory Visit: Payer: PPO | Admitting: Anesthesiology

## 2019-01-27 ENCOUNTER — Ambulatory Visit
Admission: RE | Admit: 2019-01-27 | Discharge: 2019-01-27 | Disposition: A | Discharge status: Home / Self Care | Payer: PPO | Attending: Unknown Physician Specialty | Admitting: Unknown Physician Specialty

## 2019-01-27 ENCOUNTER — Encounter
Admission: RE | Disposition: A | Discharge status: Home / Self Care | Payer: Self-pay | Source: Home / Self Care | Attending: Unknown Physician Specialty

## 2019-01-27 DIAGNOSIS — M1990 Unspecified osteoarthritis, unspecified site: Secondary | ICD-10-CM | POA: Insufficient documentation

## 2019-01-27 DIAGNOSIS — I2510 Atherosclerotic heart disease of native coronary artery without angina pectoris: Secondary | ICD-10-CM | POA: Insufficient documentation

## 2019-01-27 DIAGNOSIS — K219 Gastro-esophageal reflux disease without esophagitis: Secondary | ICD-10-CM | POA: Diagnosis not present

## 2019-01-27 DIAGNOSIS — K228 Other specified diseases of esophagus: Secondary | ICD-10-CM | POA: Diagnosis not present

## 2019-01-27 DIAGNOSIS — K5730 Diverticulosis of large intestine without perforation or abscess without bleeding: Secondary | ICD-10-CM | POA: Diagnosis not present

## 2019-01-27 DIAGNOSIS — D125 Benign neoplasm of sigmoid colon: Secondary | ICD-10-CM | POA: Diagnosis not present

## 2019-01-27 DIAGNOSIS — D509 Iron deficiency anemia, unspecified: Secondary | ICD-10-CM | POA: Insufficient documentation

## 2019-01-27 DIAGNOSIS — K559 Vascular disorder of intestine, unspecified: Secondary | ICD-10-CM | POA: Insufficient documentation

## 2019-01-27 DIAGNOSIS — K529 Noninfective gastroenteritis and colitis, unspecified: Secondary | ICD-10-CM | POA: Diagnosis not present

## 2019-01-27 DIAGNOSIS — K5750 Diverticulosis of both small and large intestine without perforation or abscess without bleeding: Secondary | ICD-10-CM | POA: Diagnosis not present

## 2019-01-27 DIAGNOSIS — K55041 Focal (segmental) acute infarction of large intestine: Secondary | ICD-10-CM | POA: Diagnosis not present

## 2019-01-27 DIAGNOSIS — Z79899 Other long term (current) drug therapy: Secondary | ICD-10-CM | POA: Insufficient documentation

## 2019-01-27 DIAGNOSIS — Z87891 Personal history of nicotine dependence: Secondary | ICD-10-CM | POA: Diagnosis not present

## 2019-01-27 DIAGNOSIS — I10 Essential (primary) hypertension: Secondary | ICD-10-CM | POA: Diagnosis not present

## 2019-01-27 DIAGNOSIS — Z7901 Long term (current) use of anticoagulants: Secondary | ICD-10-CM | POA: Insufficient documentation

## 2019-01-27 DIAGNOSIS — Z888 Allergy status to other drugs, medicaments and biological substances status: Secondary | ICD-10-CM | POA: Diagnosis not present

## 2019-01-27 DIAGNOSIS — Z96652 Presence of left artificial knee joint: Secondary | ICD-10-CM | POA: Diagnosis not present

## 2019-01-27 DIAGNOSIS — K317 Polyp of stomach and duodenum: Secondary | ICD-10-CM | POA: Diagnosis not present

## 2019-01-27 DIAGNOSIS — K5710 Diverticulosis of small intestine without perforation or abscess without bleeding: Secondary | ICD-10-CM | POA: Diagnosis not present

## 2019-01-27 DIAGNOSIS — K314 Gastric diverticulum: Secondary | ICD-10-CM | POA: Diagnosis not present

## 2019-01-27 DIAGNOSIS — I4891 Unspecified atrial fibrillation: Secondary | ICD-10-CM | POA: Insufficient documentation

## 2019-01-27 DIAGNOSIS — K51519 Left sided colitis with unspecified complications: Secondary | ICD-10-CM | POA: Diagnosis not present

## 2019-01-27 DIAGNOSIS — Z8546 Personal history of malignant neoplasm of prostate: Secondary | ICD-10-CM | POA: Diagnosis not present

## 2019-01-27 DIAGNOSIS — D649 Anemia, unspecified: Secondary | ICD-10-CM | POA: Diagnosis not present

## 2019-01-27 HISTORY — PX: COLONOSCOPY WITH PROPOFOL: SHX5780

## 2019-01-27 HISTORY — PX: ESOPHAGOGASTRODUODENOSCOPY: SHX5428

## 2019-01-27 HISTORY — DX: Personal history of urinary calculi: Z87.442

## 2019-01-27 HISTORY — DX: Cardiac arrhythmia, unspecified: I49.9

## 2019-01-27 SURGERY — EGD (ESOPHAGOGASTRODUODENOSCOPY)
Anesthesia: General

## 2019-01-27 MED ORDER — MIDAZOLAM HCL 2 MG/2ML IJ SOLN
INTRAMUSCULAR | Status: AC
  Filled 2019-01-27: qty 2

## 2019-01-27 MED ORDER — PROPOFOL 500 MG/50ML IV EMUL
INTRAVENOUS | Status: AC
  Filled 2019-01-27: qty 50

## 2019-01-27 MED ORDER — MIDAZOLAM HCL 5 MG/5ML IJ SOLN
INTRAMUSCULAR | Status: DC | PRN
  Administered 2019-01-27: 2 mg via INTRAVENOUS

## 2019-01-27 MED ORDER — FENTANYL CITRATE (PF) 100 MCG/2ML IJ SOLN
INTRAMUSCULAR | Status: DC | PRN
  Administered 2019-01-27 (×2): 50 ug via INTRAVENOUS

## 2019-01-27 MED ORDER — PIPERACILLIN-TAZOBACTAM 3.375 G IVPB
INTRAVENOUS | Status: AC
  Administered 2019-01-27: 08:00:00 3.375 g
  Filled 2019-01-27: qty 50

## 2019-01-27 MED ORDER — LIDOCAINE HCL (PF) 2 % IJ SOLN
INTRAMUSCULAR | Status: DC | PRN
  Administered 2019-01-27: 100 mg

## 2019-01-27 MED ORDER — SODIUM CHLORIDE 0.9 % IV SOLN
INTRAVENOUS | Status: DC
  Administered 2019-01-27 (×2): via INTRAVENOUS

## 2019-01-27 MED ORDER — PROPOFOL 500 MG/50ML IV EMUL
INTRAVENOUS | Status: DC | PRN
  Administered 2019-01-27: 50 ug/kg/min via INTRAVENOUS

## 2019-01-27 MED ORDER — SODIUM CHLORIDE 0.9 % IV SOLN: INTRAVENOUS | Status: DC

## 2019-01-27 MED ORDER — FENTANYL CITRATE (PF) 100 MCG/2ML IJ SOLN
INTRAMUSCULAR | Status: AC
  Filled 2019-01-27: qty 2

## 2019-01-27 MED ORDER — PROPOFOL 10 MG/ML IV BOLUS
INTRAVENOUS | Status: DC | PRN
  Administered 2019-01-27 (×2): 20 mg via INTRAVENOUS
  Administered 2019-01-27: 10 mg via INTRAVENOUS

## 2019-01-27 MED ORDER — PIPERACILLIN-TAZOBACTAM 3.375 G IVPB 30 MIN
3.3750 g | Freq: Once | INTRAVENOUS | Status: DC
  Filled 2019-01-27: qty 50

## 2019-01-27 NOTE — OR Nursing (Signed)
Pt denies any abdominal pain. Bloated. Dr Tiffany Kocher reports turn from side to side.

## 2019-01-27 NOTE — H&P (Signed)
Primary Care Physician:  Shon Baton, MD Primary Gastroenterologist:  Dr. Vira Agar  Pre-Procedure History & Physical: HPI:  Jim Dodson is a 78 y.o. male is here for an endoscopy and colonoscopy.  For iron def anemia.   Past Medical History:  Diagnosis Date  . A-fib   . Arthritis   . Atrial fibrillation ([place])   . Coronary artery disease    stent placed 2009  . Headache   . History of hiatal hernia   . History of kidney stones   . Hypertension   . Prostate cancer [facility])    prostate cancer, had sx  . Shortness of breath dyspnea     Past Surgical History:  Procedure Laterality Date  . CARDIAC CATHETERIZATION     2009, stent placed  . CARDIOVERSION N/A 03/13/2015   Procedure: CARDIOVERSION;  Surgeon: Jerline Pain, MD;  Location: [place];  Service: Cardiovascular;  Laterality: N/A;  . COLONOSCOPY WITH PROPOFOL N/A 07/19/2015   Procedure: COLONOSCOPY WITH PROPOFOL;  Surgeon: Manya Silvas, MD;  Location: [facility] ENDOSCOPY;  Service: Endoscopy;  Laterality: N/A;  . cryo surgery of prostate    . ESOPHAGOGASTRODUODENOSCOPY (EGD) WITH PROPOFOL N/A 07/19/2015   Procedure: ESOPHAGOGASTRODUODENOSCOPY (EGD) WITH PROPOFOL;  Surgeon: Manya Silvas, MD;  Location: [facility] ENDOSCOPY;  Service: Endoscopy;  Laterality: N/A;  . LAPAROTOMY N/A 03/08/2015   Procedure: EXPLORATORY LAPAROTOMY;  Surgeon: Autumn Messing III, MD;  Location: [place];  Service: General;  Laterality: N/A;  . LAPAROTOMY N/A 03/09/2015   Procedure: EXPLORATORY LAPAROTOMY AND EVACUATION OF HEMATOMA;  Surgeon: Autumn Messing III, MD;  Location: [place];  Service: General;  Laterality: N/A;  . LYSIS OF ADHESION N/A 03/08/2015   Procedure: LYSIS OF ADHESION;  Surgeon: Autumn Messing III, MD;  Location: [place];  Service: General;  Laterality: N/A;  . OMENTECTOMY N/A 03/08/2015   Procedure: OMENTECTOMY;  Surgeon: Autumn Messing III, MD;  Location: [place];  Service: General;  Laterality: N/A;  . TEE WITHOUT CARDIOVERSION N/A 03/13/2015   Procedure:  TRANSESOPHAGEAL ECHOCARDIOGRAM (TEE);  Surgeon: Jerline Pain, MD;  Location: [place];  Service: Cardiovascular;  Laterality: N/A;  . TOTAL KNEE ARTHROPLASTY Left 04/08/2016   Procedure: LEFT TOTAL KNEE ARTHROPLASTY;  Surgeon: Leandrew Koyanagi, MD;  Location: [place];  Service: Orthopedics;  Laterality: Left;  . UMBILICAL HERNIA REPAIR  03/08/2015   Procedure: UMBILICAL HERNIA REPAIR  ADULT;  Surgeon: Autumn Messing III, MD;  Location: [place];  Service: General;;  . VENTRAL HERNIA REPAIR N/A 03/08/2015   Procedure:  VENTRAL HERNIA  REPAIR ADULT;  Surgeon: Autumn Messing III, MD;  Location: [place];  Service: General;  Laterality: N/A;    Prior to Admission medications   Medication Sig Start Date End Date Taking? Authorizing Provider  apixaban (ELIQUIS) 5 MG TABS tablet Take 5 mg by mouth 2 (two) times daily.   Yes [provider]  hydrALAZINE (APRESOLINE) 50 MG tablet Take 1 tablet (50 mg total) by mouth every 8 (eight) hours. 07/27/15  Yes Thurnell Lose, MD  hydrochlorothiazide (HYDRODIURIL) 25 MG tablet Take 25 mg by mouth daily.   Yes [provider]  lisinopril (PRINIVIL,ZESTRIL) 20 MG tablet Take 20 mg by mouth daily.   Yes [provider]  loperamide (ANTI-DIARRHEAL) 2 MG capsule Take 2 mg by mouth every morning.   Yes [provider]  meclizine (ANTIVERT) 12.5 MG tablet Take 12.5 mg by mouth 3 (three) times daily as needed for dizziness.   Yes  [provider]  memantine (NAMENDA) 5 MG tablet 10 mg.  12/09/17  Yes [provider]  metoprolol (LOPRESSOR) 100 MG tablet Take 1 tablet (100 mg total) by mouth 2 (two) times daily. 07/27/15  Yes Thurnell Lose, MD  Nutritional Supplements (NUTRITIONAL SUPPLEMENT PO) Take 2 tablets by mouth 3 (three) times daily. [place]: 40% ketosterones/ 20% 3-ketosterone    Yes [provider]  Omega-3 Fatty Acids (FISH OIL PO) Take 1,200 mg by mouth 3 (three) times daily.    Yes [provider]   omeprazole (PRILOSEC) 20 MG capsule Take 20 mg by mouth daily.   Yes [provider]  Red Yeast Rice Extract 600 MG CAPS Take 1,200 mg by mouth 2 (two) times daily.   Yes [provider]  tamsulosin (FLOMAX) 0.4 MG CAPS capsule Take 1 capsule (0.4 mg total) by mouth 2 (two) times a day. 12/15/18  Yes Chrystal, Eulas Post, MD  Cholecalciferol (VITAMIN D3) 5000 units TABS Take 1 tablet by mouth every other day.     [provider]    Allergies as of 01/11/2019 - Review Complete 12/15/2018  Allergen Reaction Noted  . Statins Swelling and Other (See Comments) 03/08/2015    History reviewed. No pertinent family history.  Social History   Socioeconomic History  . Marital status: Married    Spouse name: Not on file  . Number of children: Not on file  . Years of education: Not on file  . Highest education level: Not on file  Occupational History  . Not on file  Social Needs  . Financial resource strain: Not on file  . Food insecurity    Worry: Not on file    Inability: Not on file  . Transportation needs    Medical: Not on file    Non-medical: Not on file  Tobacco Use  . Smoking status: Former Smoker    Years: 20.00    Types: Cigarettes    Quit date: 08/03/1988    Years since quitting: 30.5  . Smokeless tobacco: Never Used  Substance and Sexual Activity  . Alcohol use: No  . Drug use: No  . Sexual activity: Yes  Lifestyle  . Physical activity    Days per week: Not on file    Minutes per session: Not on file  . Stress: Not on file  Relationships  . Social Herbalist on phone: Not on file    Gets together: Not on file    Attends religious service: Not on file    Active member of club or organization: Not on file    Attends meetings of clubs or organizations: Not on file    Relationship status: Not on file  . Intimate partner violence    Fear of current or ex partner: Not on file    Emotionally abused: Not on file    Physically abused: Not  on file    Forced sexual activity: Not on file  Other Topics Concern  . Not on file  Social History Narrative  . Not on file    Review of Systems: See HPI, otherwise negative ROS  Physical Exam: BP 118/90   Pulse 86   Temp (!) 97.4 F (36.3 C) (Tympanic)   Resp 16   Ht 5\' 5"  (1.651 m)   Wt 95.3 kg   SpO2 100%   BMI 34.95 kg/m  General:   Alert,  pleasant and cooperative in NAD Head:  Normocephalic and atraumatic. Neck:  Supple; no masses or thyromegaly. Lungs:  Clear throughout to auscultation.    Heart:  Regular rate and rhythm. Abdomen:  Soft, nontender and nondistended. Normal bowel sounds, without guarding, and without rebound.   Neurologic:  Alert and  oriented x4;  grossly normal neurologically.  Impression/Plan: [name] is here for an endoscopy and colonoscopy to be performed for iron def anemia.  Risks, benefits, limitations, and alternatives regarding  endoscopy and colonoscopy have been reviewed with the patient.  Questions have been answered.  All parties agreeable.   Gaylyn Cheers, MD  01/27/2019, 8:43 AM

## 2019-01-27 NOTE — Op Note (Signed)
[facility] Patient Name: Jim Dodson Procedure Date: 01/27/2019 8:20 AM MRN: [id number] Account #: [id number] Date of Birth: 08-15-1940 Admit Type: Outpatient Age: 78 Room: [facility] ENDO ROOM 4 Gender: Male Note Status: Finalized Procedure:            Colonoscopy Indications:          Unexplained iron deficiency anemia Providers:            Manya Silvas, MD Medicines:            Propofol per Anesthesia Complications:        No immediate complications. Procedure:            Pre-Anesthesia Assessment:                       - After reviewing the risks and benefits, the patient                        was deemed in satisfactory condition to undergo the                        procedure.                       After obtaining informed consent, the colonoscope was                        passed under direct vision. Throughout the procedure,                        the patient's blood pressure, pulse, and oxygen                        saturations were monitored continuously. The                        Colonoscope was introduced through the anus and                        advanced to the the cecum, identified by appendiceal                        orifice and ileocecal valve. The colonoscopy was                        performed without difficulty. The patient tolerated the                        procedure well. The quality of the bowel preparation                        was good. Findings:      Localized moderate inflammation characterized by erosions, erythema and       granularity was found in the sigmoid colon. This was focal and may be       ischemic. Biopsies were taken with a cold forceps for histology.      Many small and large-mouthed diverticula were found in the descending       colon and transverse colon.      scattered vascular mild mucosal changes were found in the ascending       colon. This may be  due to colon inflation while procedure was  in progress Impression:           - Localized moderate inflammation was found in the                        sigmoid colon secondary to ischemic colitis and                        consistent with ischemic colitis. Biopsied.                       - Diverticulosis in the descending colon and in the                        transverse colon.                       - Mild mucosal changes were found in the ascending                        colon. Recommendation:       - The findings and recommendations were discussed with                        the patient's family. Manya Silvas, MD 01/27/2019 9:41:45 AM This report has been signed electronically. Number of Addenda: 0 Note Initiated On: 01/27/2019 8:20 AM Scope Withdrawal Time: 0 hours 8 minutes 50 seconds  Total Procedure Duration: 0 hours 15 minutes 11 seconds       [facility]

## 2019-01-27 NOTE — Anesthesia Postprocedure Evaluation (Signed)
Anesthesia Post Note  Patient: Jim Dodson  Procedure(s) Performed: ESOPHAGOGASTRODUODENOSCOPY (EGD) (N/A ) COLONOSCOPY WITH PROPOFOL (N/A )  Patient location during evaluation: PACU Anesthesia Type: General Level of consciousness: awake and alert Pain management: pain level controlled Vital Signs Assessment: post-procedure vital signs reviewed and stable Respiratory status: spontaneous breathing, nonlabored ventilation and respiratory function stable Cardiovascular status: blood pressure returned to baseline and stable Postop Assessment: no apparent nausea or vomiting Anesthetic complications: no     Last Vitals:  Vitals:   01/27/19 0804 01/27/19 0928  BP: 118/90 108/72  Pulse: 86   Resp: 16   Temp: (!) 36.3 C (!) 36.2 C  SpO2: 100%     Last Pain:  Vitals:   01/27/19 1018  TempSrc:   PainSc: 0-No pain                 Durenda Hurt

## 2019-01-27 NOTE — Op Note (Signed)
[facility]enter[facility] Patient Name: Jim Dodson Procedure Date: 01/27/2019 8:21 AM MRN: [id number] Account #: [id number] Date of Birth: 20-Aug-1940 Admit Type: Outpatient Age: 78 Room: [facility] ENDO ROOM 4 Gender: Male Note Status: Finalized Procedure:            Upper GI endoscopy Indications:          Unexplained iron deficiency anemia Providers:            Manya Silvas, MD Referring MD:         Bradley Ferris (Referring MD), Shon Baton MD, MD                        (Referring MD) Medicines:            Propofol per Anesthesia Complications:        No immediate complications. Procedure:            Pre-Anesthesia Assessment:                       - After reviewing the risks and benefits, the patient                        was deemed in satisfactory condition to undergo the                        procedure.                       After obtaining informed consent, the endoscope was                        passed under direct vision. Throughout the procedure,                        the patient's blood pressure, pulse, and oxygen                        saturations were monitored continuously. The Endoscope                        was introduced through the mouth, and advanced to the                        second part of duodenum. The upper GI endoscopy was                        somewhat difficult due to The polyp in the cardia was                        not easy to approach but I was able to biopsy it twi.                        The patient tolerated the procedure well. Findings:      The examined esophagus was normal. GEJ 42cm. A polyp in the cardia was       biopsied.      Two small sessile polyps with no bleeding and no stigmata of recent       bleeding were found in the gastric body. Biopsies were taken with a cold  forceps for histology.      The examined duodenum was normal.      A large non-bleeding diverticulum was found in the second portion  of the       duodenum. Impression:           - Normal esophagus.                       - Two gastric polyps. Biopsied.                       - Normal examined duodenum. Recommendation:       - Await pathology results. Manya Silvas, MD 01/27/2019 9:05:56 AM This report has been signed electronically. Number of Addenda: 0 Note Initiated On: 01/27/2019 8:21 AM      [facility]

## 2019-01-27 NOTE — Transfer of Care (Signed)
Immediate Anesthesia Transfer of Care Note  Patient: Jim Dodson  Procedure(s) Performed: ESOPHAGOGASTRODUODENOSCOPY (EGD) (N/A ) COLONOSCOPY WITH PROPOFOL (N/A )  Patient Location: PACU  Anesthesia Type:General  Level of Consciousness: sedated  Airway & Oxygen Therapy: Patient Spontanous Breathing and Patient connected to nasal cannula oxygen  Post-op Assessment: Report given to RN and Post -op Vital signs reviewed and stable  Post vital signs: Reviewed and stable  Last Vitals:  Vitals Value Taken Time  BP 108/72 01/27/19 0930  Temp 36.2 C 01/27/19 0928  Pulse 81 01/27/19 0933  Resp 16 01/27/19 0933  SpO2 96 % 01/27/19 0933  Vitals shown include unvalidated device data.  Last Pain:  Vitals:   01/27/19 0928  TempSrc: Tympanic  PainSc:          Complications: No apparent anesthesia complications

## 2019-01-27 NOTE — Anesthesia Post-op Follow-up Note (Signed)
Anesthesia QCDR form completed.        

## 2019-01-27 NOTE — Anesthesia Preprocedure Evaluation (Signed)
Anesthesia Evaluation  Patient identified by MRN, date of birth, ID band Patient awake    Reviewed: Allergy & Precautions, H&P , NPO status , Patient's Chart, lab work & pertinent test results  Airway Mallampati: III  TM Distance: >3 FB     Dental  (+) Chipped   Pulmonary shortness of breath and with exertion, former smoker,           Cardiovascular hypertension, + CAD and + Cardiac Stents (2009)  + dysrhythmias Atrial Fibrillation      Neuro/Psych  Headaches, negative psych ROS   GI/Hepatic Neg liver ROS, hiatal hernia, GERD  ,  Endo/Other  negative endocrine ROS  Renal/GU Renal disease (stones)  negative genitourinary   Musculoskeletal   Abdominal   Peds  Hematology negative hematology ROS (+)   Anesthesia Other Findings Obese  Past Medical History: No date: A-fib No date: Arthritis No date: Atrial fibrillation ([facility]) No date: Coronary artery disease     Comment:  stent placed 2009 No date: Headache No date: History of hiatal hernia No date: History of kidney stones No date: Hypertension No date: Prostate cancer [facility])     Comment:  prostate cancer, had sx No date: Shortness of breath dyspnea  Past Surgical History: No date: CARDIAC CATHETERIZATION     Comment:  2009, stent placed 03/13/2015: CARDIOVERSION; N/A     Comment:  Procedure: CARDIOVERSION;  Surgeon: Jerline Pain, MD;                Location: [place];  Service: Cardiovascular;                Laterality: N/A; 07/19/2015: COLONOSCOPY WITH PROPOFOL; N/A     Comment:  Procedure: COLONOSCOPY WITH PROPOFOL;  Surgeon: Manya Silvas, MD;  Location: [facility] ENDOSCOPY;  Service:               Endoscopy;  Laterality: N/A; No date: cryo surgery of prostate 07/19/2015: ESOPHAGOGASTRODUODENOSCOPY (EGD) WITH PROPOFOL; N/A     Comment:  Procedure: ESOPHAGOGASTRODUODENOSCOPY (EGD) WITH               PROPOFOL;  Surgeon: Manya Silvas,  MD;  Location: [place];  Service: Endoscopy;  Laterality: N/A; 03/08/2015: LAPAROTOMY; N/A     Comment:  Procedure: EXPLORATORY LAPAROTOMY;  Surgeon: Autumn Messing               III, MD;  Location: [place];  Service: General;                Laterality: N/A; 03/09/2015: LAPAROTOMY; N/A     Comment:  Procedure: EXPLORATORY LAPAROTOMY AND EVACUATION OF               HEMATOMA;  Surgeon: Autumn Messing III, MD;  Location: [place];               Service: General;  Laterality: N/A; 03/08/2015: LYSIS OF ADHESION; N/A     Comment:  Procedure: LYSIS OF ADHESION;  Surgeon: Autumn Messing III,               MD;  Location: [place];  Service: General;  Laterality:               N/A; 03/08/2015: OMENTECTOMY; N/A     Comment:  Procedure: OMENTECTOMY;  Surgeon: Autumn Messing III, MD;  [facility];  Service: General;  Laterality: N/A; 03/13/2015: TEE WITHOUT CARDIOVERSION; N/A     Comment:  Procedure: TRANSESOPHAGEAL ECHOCARDIOGRAM (TEE);                Surgeon: Jerline Pain, MD;  Location: [place];                Service: Cardiovascular;  Laterality: N/A; 04/08/2016: TOTAL KNEE ARTHROPLASTY; Left     Comment:  Procedure: LEFT TOTAL KNEE ARTHROPLASTY;  Surgeon:               Leandrew Koyanagi, MD;  Location: [place];  Service:               Orthopedics;  Laterality: Left; 04/08/7590: UMBILICAL HERNIA REPAIR     Comment:  Procedure: UMBILICAL HERNIA REPAIR  ADULT;  Surgeon:               Autumn Messing III, MD;  Location: [place];  Service: General;; 03/08/2015: VENTRAL HERNIA REPAIR; N/A     Comment:  Procedure:  VENTRAL HERNIA  REPAIR ADULT;  Surgeon: Autumn Messing III, MD;  Location: [place];  Service: General;                Laterality: N/A;  BMI    Body Mass Index: 34.95 kg/m      Reproductive/Obstetrics negative OB ROS                             Anesthesia Physical Anesthesia Plan  ASA: III  Anesthesia Plan: General   Post-op Pain Management:    Induction:    PONV Risk Score and Plan: Propofol infusion and TIVA  Airway Management Planned: Natural Airway and Nasal Cannula  Additional Equipment:   Intra-op Plan:   Post-operative Plan:   Informed Consent: I have reviewed the patients History and Physical, chart, labs and discussed the procedure including the risks, benefits and alternatives for the proposed anesthesia with the patient or authorized representative who has indicated his/her understanding and acceptance.     Dental Advisory Given  Plan Discussed with: Anesthesiologist and CRNA  Anesthesia Plan Comments:         Anesthesia Quick Evaluation

## 2019-01-30 LAB — SURGICAL PATHOLOGY

## 2019-02-01 DIAGNOSIS — D649 Anemia, unspecified: Secondary | ICD-10-CM | POA: Diagnosis not present

## 2019-02-09 DIAGNOSIS — K559 Vascular disorder of intestine, unspecified: Secondary | ICD-10-CM | POA: Insufficient documentation

## 2019-02-09 DIAGNOSIS — D509 Iron deficiency anemia, unspecified: Secondary | ICD-10-CM | POA: Diagnosis not present

## 2019-02-09 DIAGNOSIS — K219 Gastro-esophageal reflux disease without esophagitis: Secondary | ICD-10-CM | POA: Diagnosis not present

## 2019-02-21 DIAGNOSIS — D509 Iron deficiency anemia, unspecified: Secondary | ICD-10-CM | POA: Diagnosis not present

## 2019-02-21 DIAGNOSIS — D696 Thrombocytopenia, unspecified: Secondary | ICD-10-CM | POA: Diagnosis not present

## 2019-03-23 DIAGNOSIS — D509 Iron deficiency anemia, unspecified: Secondary | ICD-10-CM | POA: Diagnosis not present

## 2019-03-31 ENCOUNTER — Encounter: Payer: Self-pay | Admitting: Orthopaedic Surgery

## 2019-03-31 ENCOUNTER — Ambulatory Visit: Payer: Self-pay

## 2019-03-31 ENCOUNTER — Ambulatory Visit (INDEPENDENT_AMBULATORY_CARE_PROVIDER_SITE_OTHER): Payer: PPO | Admitting: Orthopaedic Surgery

## 2019-03-31 VITALS — Ht 64.0 in | Wt 219.0 lb

## 2019-03-31 DIAGNOSIS — Z96652 Presence of left artificial knee joint: Secondary | ICD-10-CM | POA: Diagnosis not present

## 2019-03-31 NOTE — Progress Notes (Signed)
Office Visit Note   Patient: Jim Dodson           Date of Birth: [birth year]           MRN: [id number] Visit Date: 03/31/2019              Requested by: Shon Baton, [place],  [place] [id number] PCP: Shon Baton, MD   Assessment & Plan: Visit Diagnoses:  1. Status post total left knee replacement     Plan: Jim Dodson has done really well from his knee replacement.  We can see him back in 2 years for his 5-year follow-up.  He states that his right knee is starting to bother him but not to the point where he would want to undergo a knee replacement at this time.  He will need 2 view x-rays of the left knee on return.  Follow-Up Instructions: Return in about 2 years (around 03/30/2021) for 5 year left TKA follow up.   Orders:  Orders Placed This Encounter  Procedures   XR Knee 1-2 Views Left   No orders of the defined types were placed in this encounter.     Procedures: No procedures performed   Clinical Data: No additional findings.   Subjective: Chief Complaint  Patient presents with   Left Knee - Follow-up    68yr s/p Left TKA    Jim Dodson is 3 years status post left total knee replacement.  He is doing well and has no complaints other than numbness on the lateral side of his left knee from the surgery.   Review of Systems   Objective: Vital Signs: Ht 5\' 4"  (1.626 m)    Wt 219 lb (99.3 kg)    BMI 37.59 kg/m   Physical Exam  Ortho Exam Left knee exam shows no joint effusion.  Excellent range of motion.  Collaterals are stable.  Fully healed surgical scar. Specialty Comments:  No specialty comments available.  Imaging: Xr Knee 1-2 Views Left  Result Date: 03/31/2019 Stable total knee replacement in good alignment.     PMFS History: Patient Active Problem List   Diagnosis Date Noted   Hyperlipidemia 04/20/2018   Total knee replacement status 04/08/2016   Abnormal finding of diagnostic imaging 02/28/2016   Sepsis ([place])  07/25/2015   On continuous oral anticoagulation 07/25/2015   Essential hypertension 07/25/2015   GERD (gastroesophageal reflux disease) 07/25/2015   Acute upper respiratory infection 07/25/2015   Bladder outlet obstruction 05/28/2015   Incarcerated ventral hernia 03/14/2015   Atrial fibrillation with rapid ventricular response (HCC)    Chronic coronary artery disease    Elevated troponin    Patient receiving intravenous heparin therapy    SBO (small bowel obstruction) ([place]) 03/08/2015   Male urinary stress incontinence 08/17/2012   ED (erectile dysfunction) of organic origin 03/30/2012   Incomplete emptying of bladder 03/30/2012   Inguinal hernia 03/30/2012   Kidney stone 03/30/2012   Malignant neoplasm of prostate ([place]) 03/30/2012   Past Medical History:  Diagnosis Date   A-fib    Arthritis    Atrial fibrillation ([place])    Coronary artery disease    stent placed 2009   Headache    History of hiatal hernia    History of kidney stones    Hypertension    Prostate cancer ([place])    prostate cancer, had sx   Shortness of breath dyspnea     History reviewed. No pertinent family history.  Past Surgical History:  Procedure Laterality Date   CARDIAC CATHETERIZATION     2009, stent placed   CARDIOVERSION N/A 03/13/2015   Procedure: CARDIOVERSION;  Surgeon: Jerline Pain, MD;  Location: [place];  Service: Cardiovascular;  Laterality: N/A;   COLONOSCOPY WITH PROPOFOL N/A 07/19/2015   Procedure: COLONOSCOPY WITH PROPOFOL;  Surgeon: Manya Silvas, MD;  Location: [facility] ENDOSCOPY;  Service: Endoscopy;  Laterality: N/A;   COLONOSCOPY WITH PROPOFOL N/A 01/27/2019   Procedure: COLONOSCOPY WITH PROPOFOL;  Surgeon: Manya Silvas, MD;  Location: [facility] END[facility]COPY;  Service: Endoscopy;  Laterality: N/A;   cryo surgery of prostate     ESOPHAGOGASTRODUODENOSCOPY N/A 01/27/2019   Procedure: ESOPHAGOGASTRODUODENOSCOPY (EGD);  Surgeon: Manya Silvas, MD;   Location: [facility] ENDOSCOPY;  Service: Endoscopy;  Laterality: N/A;   ESOPHAGOGASTRODUODENOSCOPY (EGD) WITH PROPOFOL N/A 07/19/2015   Procedure: ESOPHAGOGASTRODUODENOSCOPY (EGD) WITH PROPOFOL;  Surgeon: Manya Silvas, MD;  Location: [facility] ENDOSCOPY;  Service: Endoscopy;  Laterality: N/A;   LAPAROTOMY N/A 03/08/2015   Procedure: EXPLORATORY LAPAROTOMY;  Surgeon: Autumn Messing III, MD;  Location: [place];  Service: General;  Laterality: N/A;   LAPAROTOMY N/A 03/09/2015   Procedure: EXPLORATORY LAPAROTOMY AND EVACUATION OF HEMATOMA;  Surgeon: Autumn Messing III, MD;  Location: [place];  Service: General;  Laterality: N/A;   LYSIS OF ADHESION N/A 03/08/2015   Procedure: LYSIS OF ADHESION;  Surgeon: Autumn Messing III, MD;  Location: [place];  Service: General;  Laterality: N/A;   OMENTECTOMY N/A 03/08/2015   Procedure: OMENTECTOMY;  Surgeon: Autumn Messing III, MD;  Location: [place];  Service: General;  Laterality: N/A;   TEE WITHOUT CARDIOVERSION N/A 03/13/2015   Procedure: TRANSESOPHAGEAL ECHOCARDIOGRAM (TEE);  Surgeon: Jerline Pain, MD;  Location: [place];  Service: Cardiovascular;  Laterality: N/A;   TOTAL KNEE ARTHROPLASTY Left 04/08/2016   Procedure: LEFT TOTAL KNEE ARTHROPLASTY;  Surgeon: Leandrew Koyanagi, MD;  Location: [place];  Service: Orthopedics;  Laterality: Left;   UMBILICAL HERNIA REPAIR  03/08/2015   Procedure: UMBILICAL HERNIA REPAIR  ADULT;  Surgeon: Autumn Messing III, MD;  Location: [place];  Service: General;;   VENTRAL HERNIA REPAIR N/A 03/08/2015   Procedure:  VENTRAL HERNIA  REPAIR ADULT;  Surgeon: Autumn Messing III, MD;  Location: [place];  Service: General;  Laterality: N/A;   Social History   Occupational History   Not on file  Tobacco Use   Smoking status: Former Smoker    Years: 20.00    Types: Cigarettes    Quit date: 08/03/1988    Years since quitting: 30.6   Smokeless tobacco: Never Used  Substance and Sexual Activity   Alcohol use: No   Drug use: No   Sexual activity: Yes

## 2019-04-03 DIAGNOSIS — K148 Other diseases of tongue: Secondary | ICD-10-CM | POA: Diagnosis not present

## 2019-04-03 DIAGNOSIS — I110 Hypertensive heart disease with heart failure: Secondary | ICD-10-CM | POA: Diagnosis not present

## 2019-04-07 ENCOUNTER — Ambulatory Visit: Payer: PPO | Admitting: Urology

## 2019-04-11 ENCOUNTER — Encounter (INDEPENDENT_AMBULATORY_CARE_PROVIDER_SITE_OTHER): Payer: PPO

## 2019-04-11 ENCOUNTER — Encounter (INDEPENDENT_AMBULATORY_CARE_PROVIDER_SITE_OTHER): Payer: PPO | Admitting: Vascular Surgery

## 2019-04-11 ENCOUNTER — Other Ambulatory Visit (INDEPENDENT_AMBULATORY_CARE_PROVIDER_SITE_OTHER): Payer: Self-pay | Admitting: Vascular Surgery

## 2019-04-11 DIAGNOSIS — K559 Vascular disorder of intestine, unspecified: Secondary | ICD-10-CM

## 2019-04-13 DIAGNOSIS — I4891 Unspecified atrial fibrillation: Secondary | ICD-10-CM | POA: Diagnosis not present

## 2019-04-13 DIAGNOSIS — K219 Gastro-esophageal reflux disease without esophagitis: Secondary | ICD-10-CM | POA: Diagnosis not present

## 2019-04-13 DIAGNOSIS — I10 Essential (primary) hypertension: Secondary | ICD-10-CM | POA: Diagnosis not present

## 2019-04-13 DIAGNOSIS — I25118 Atherosclerotic heart disease of native coronary artery with other forms of angina pectoris: Secondary | ICD-10-CM | POA: Diagnosis not present

## 2019-04-13 DIAGNOSIS — C61 Malignant neoplasm of prostate: Secondary | ICD-10-CM | POA: Diagnosis not present

## 2019-04-13 DIAGNOSIS — E782 Mixed hyperlipidemia: Secondary | ICD-10-CM | POA: Diagnosis not present

## 2019-04-13 DIAGNOSIS — K559 Vascular disorder of intestine, unspecified: Secondary | ICD-10-CM | POA: Diagnosis not present

## 2019-04-14 ENCOUNTER — Encounter (INDEPENDENT_AMBULATORY_CARE_PROVIDER_SITE_OTHER): Payer: PPO | Admitting: Vascular Surgery

## 2019-04-14 ENCOUNTER — Encounter (INDEPENDENT_AMBULATORY_CARE_PROVIDER_SITE_OTHER): Payer: PPO

## 2019-04-20 ENCOUNTER — Ambulatory Visit: Payer: PPO | Admitting: Urology

## 2019-04-20 ENCOUNTER — Other Ambulatory Visit: Payer: Self-pay

## 2019-04-20 ENCOUNTER — Encounter: Payer: Self-pay | Admitting: Urology

## 2019-04-20 ENCOUNTER — Encounter

## 2019-04-20 VITALS — BP 105/67 | HR 69 | Ht 65.0 in | Wt 212.0 lb

## 2019-04-20 DIAGNOSIS — R32 Unspecified urinary incontinence: Secondary | ICD-10-CM | POA: Diagnosis not present

## 2019-04-20 DIAGNOSIS — C61 Malignant neoplasm of prostate: Secondary | ICD-10-CM | POA: Diagnosis not present

## 2019-04-20 LAB — URINALYSIS, COMPLETE
Bilirubin, UA: NEGATIVE
Glucose, UA: NEGATIVE
Ketones, UA: NEGATIVE
Leukocytes,UA: NEGATIVE
Nitrite, UA: NEGATIVE
Protein,UA: NEGATIVE
RBC, UA: NEGATIVE
Specific Gravity, UA: 1.025 (ref 1.005–1.030)
Urobilinogen, Ur: 0.2 mg/dL (ref 0.2–1.0)
pH, UA: 6.5 (ref 5.0–7.5)

## 2019-04-20 LAB — MICROSCOPIC EXAMINATION: Bacteria, UA: NONE SEEN

## 2019-04-20 NOTE — Progress Notes (Signed)
04/20/2019 1:30 PM   Jim Dodson 10/23/1940 [id number]  Referring provider: Shon Baton, MD [address] [place],  [place] [address]  Chief Complaint  Patient presents with  . Urinary Incontinence    HPI: 78 y.o. male who underwent salvage radiation therapy for intermediate risk prostate cancer after primary cryoablation by Dr. Jacqlyn Larsen in 2008.  He last saw Dr. Baruch Gouty May 2020 and PSA was 0.04.  He had worsening urinary symptoms posttreatment primarily urinary frequency, urgency, urge incontinence and nocturia x3.  Denies dysuria or gross hematuria.  He is on tamsulosin which was increased to 0.8 mg by Dr. Baruch Gouty and there was no significant improvement with this does titration.   PMH: Past Medical History:  Diagnosis Date  . A-fib   . Arthritis   . Atrial fibrillation ([place])   . Coronary artery disease    stent placed 2009  . Headache   . History of hiatal hernia   . History of kidney stones   . Hypertension   . Prostate cancer [facility])    prostate cancer, had sx  . Shortness of breath dyspnea     Surgical History: Past Surgical History:  Procedure Laterality Date  . CARDIAC CATHETERIZATION     2009, stent placed  . CARDIOVERSION N/A 03/13/2015   Procedure: CARDIOVERSION;  Surgeon: Jerline Pain, MD;  Location: [place];  Service: Cardiovascular;  Laterality: N/A;  . COLONOSCOPY WITH PROPOFOL N/A 07/19/2015   Procedure: COLONOSCOPY WITH PROPOFOL;  Surgeon: Manya Silvas, MD;  Location: [facility] ENDOSCOPY;  Service: Endoscopy;  Laterality: N/A;  . COLONOSCOPY WITH PROPOFOL N/A 01/27/2019   Procedure: COLONOSCOPY WITH PROPOFOL;  Surgeon: Manya Silvas, MD;  Location: [facility] ENDOSCOPY;  Service: Endoscopy;  Laterality: N/A;  . cryo surgery of prostate    . ESOPHAGOGASTRODUODENOSCOPY N/A 01/27/2019   Procedure: ESOPHAGOGASTRODUODENOSCOPY (EGD);  Surgeon: Manya Silvas, MD;  Location: [facility] ENDOSCOPY;  Service: Endoscopy;  Laterality: N/A;  .  ESOPHAGOGASTRODUODENOSCOPY (EGD) WITH PROPOFOL N/A 07/19/2015   Procedure: ESOPHAGOGASTRODUODENOSCOPY (EGD) WITH PROPOFOL;  Surgeon: Manya Silvas, MD;  Location: [facility] ENDOSCOPY;  Service: Endoscopy;  Laterality: N/A;  . LAPAROTOMY N/A 03/08/2015   Procedure: EXPLORATORY LAPAROTOMY;  Surgeon: Autumn Messing III, MD;  Location: [place];  Service: General;  Laterality: N/A;  . LAPAROTOMY N/A 03/09/2015   Procedure: EXPLORATORY LAPAROTOMY AND EVACUATION OF HEMATOMA;  Surgeon: Autumn Messing III, MD;  Location: [place];  Service: General;  Laterality: N/A;  . LYSIS OF ADHESION N/A 03/08/2015   Procedure: LYSIS OF ADHESION;  Surgeon: Autumn Messing III, MD;  Location: [place];  Service: General;  Laterality: N/A;  . OMENTECTOMY N/A 03/08/2015   Procedure: OMENTECTOMY;  Surgeon: Autumn Messing III, MD;  Location: [place];  Service: General;  Laterality: N/A;  . TEE WITHOUT CARDIOVERSION N/A 03/13/2015   Procedure: TRANSESOPHAGEAL ECHOCARDIOGRAM (TEE);  Surgeon: Jerline Pain, MD;  Location: [place];  Service: Cardiovascular;  Laterality: N/A;  . TOTAL KNEE ARTHROPLASTY Left 04/08/2016   Procedure: LEFT TOTAL KNEE ARTHROPLASTY;  Surgeon: Leandrew Koyanagi, MD;  Location: [place];  Service: Orthopedics;  Laterality: Left;  . UMBILICAL HERNIA REPAIR  03/08/2015   Procedure: UMBILICAL HERNIA REPAIR  ADULT;  Surgeon: Autumn Messing III, MD;  Location: [place];  Service: General;;  . VENTRAL HERNIA REPAIR N/A 03/08/2015   Procedure:  VENTRAL HERNIA  REPAIR ADULT;  Surgeon: Autumn Messing III, MD;  Location: [place];  Service: General;  Laterality: N/A;    Home Medications:  Allergies as of 04/20/2019      Reactions   Statins Swelling, Other (See Comments)   Legs swelled up and cramped up really bad      Medication List       Accurate as of April 20, 2019  1:30 PM. If you have any questions, ask your nurse or doctor.        STOP taking these medications   lisinopril 20 MG tablet Commonly known as: ZESTRIL Stopped by: Abbie Sons, MD      TAKE these medications   Anti-Diarrheal 2 MG capsule Generic drug: loperamide Take 2 mg by mouth every morning.   Eliquis 5 MG Tabs tablet Generic drug: apixaban Take 5 mg by mouth 2 (two) times daily.   ferrous sulfate 325 (65 FE) MG tablet Take by mouth.   FISH OIL PO Take 1,200 mg by mouth 3 (three) times daily.   hydrALAZINE 50 MG tablet Commonly known as: APRESOLINE Take 1 tablet (50 mg total) by mouth every 8 (eight) hours.   hydrochlorothiazide 25 MG tablet Commonly known as: HYDRODIURIL Take 25 mg by mouth daily.   meclizine 12.5 MG tablet Commonly known as: ANTIVERT Take 12.5 mg by mouth 3 (three) times daily as needed for dizziness.   memantine 10 MG tablet Commonly known as: NAMENDA Take 10 mg by mouth 2 (two) times daily. What changed: Another medication with the same name was removed. Continue taking this medication, and follow the directions you see here. Changed by: Abbie Sons, MD   metoprolol tartrate 100 MG tablet Commonly known as: LOPRESSOR Take 1 tablet (100 mg total) by mouth 2 (two) times daily.   NUTRITIONAL SUPPLEMENT PO Take 2 tablets by mouth 3 (three) times daily. [place]: 40% ketosterones/ 20% 3-ketosterone   omeprazole 20 MG capsule Commonly known as: PRILOSEC Take 20 mg by mouth daily.   Red Yeast Rice Extract 600 MG Caps Take 1,200 mg by mouth 2 (two) times daily.   tamsulosin 0.4 MG Caps capsule Commonly known as: FLOMAX Take 1 capsule (0.4 mg total) by mouth 2 (two) times a day.   Vitamin D3 125 MCG (5000 UT) Tabs Take 1 tablet by mouth every other day.       Allergies:  Allergies  Allergen Reactions  . Statins Swelling and Other (See Comments)    Legs swelled up and cramped up really bad    Family History: No family history on file.  Social History:  reports that he quit smoking about 30 years ago. His smoking use included cigarettes. He quit after 20.00 years of use. He has never used smokeless tobacco. He  reports that he does not drink alcohol or use drugs.  ROS: UROLOGY Frequent Urination?: Yes Hard to postpone urination?: Yes Burning/pain with urination?: No Get up at night to urinate?: No Leakage of urine?: Yes Urine stream starts and stops?: No Trouble starting stream?: No Do you have to strain to urinate?: No Blood in urine?: No Urinary tract infection?: No Sexually transmitted disease?: No Injury to kidneys or bladder?: No Painful intercourse?: No Weak stream?: No Erection problems?: No Penile pain?: No  Gastrointestinal Nausea?: No Vomiting?: No Indigestion/heartburn?: No Diarrhea?: No Constipation?: No  Constitutional Fever: No Night sweats?: No Weight loss?: No Fatigue?: No  Skin Skin rash/lesions?: No Itching?: No  Eyes Blurred vision?: No Double vision?: No  Ears/Nose/Throat Sore throat?: No Sinus problems?: No  Hematologic/Lymphatic Swollen glands?: No Easy bruising?: No  Cardiovascular Leg swelling?: No Chest pain?: No  Respiratory Cough?: No Shortness of breath?: Yes  Endocrine Excessive thirst?: No  Musculoskeletal Back pain?: No Joint pain?: No  Neurological Headaches?: No Dizziness?: No  Psychologic Depression?: No Anxiety?: No  Physical Exam: BP 105/67 (BP Location: Left Arm, Patient Position: Sitting, Cuff Size: Normal)   Pulse 69   Ht 5\' 5"  (1.651 m)   Wt 212 lb (96.2 kg)   BMI 35.28 kg/m   Constitutional:  Alert and oriented, No acute distress. HEENT: [place] AT, moist mucus membranes.  Trachea midline, no masses. Cardiovascular: No clubbing, cyanosis, or edema. Respiratory: Normal respiratory effort, no increased work of breathing. Skin: No rashes, bruises or suspicious lesions. Neurologic: Grossly intact, no focal deficits, moving all 4 extremities. Psychiatric: Normal mood and affect.  Laboratory Data:  Urinalysis Dipstick/microscopy negative  Assessment & Plan:   78 y.o. male status post salvage radiation  for intermediate risk prostate cancer with worsening of storage related voiding symptoms.  Will have him decrease his tamsulosin back to 0.4 mg.  He was given Myrbetriq samples 25 mg (Lot: [id number] exp: 08/2020) x4 weeks.  He will call back regarding efficacy.   Abbie Sons, [place] [address], [place], [place] [address] [phone]

## 2019-05-10 ENCOUNTER — Telehealth: Payer: Self-pay | Admitting: Urology

## 2019-05-10 DIAGNOSIS — L570 Actinic keratosis: Secondary | ICD-10-CM | POA: Diagnosis not present

## 2019-05-10 DIAGNOSIS — L578 Other skin changes due to chronic exposure to nonionizing radiation: Secondary | ICD-10-CM | POA: Diagnosis not present

## 2019-05-10 DIAGNOSIS — D1800 Hemangioma unspecified site: Secondary | ICD-10-CM | POA: Diagnosis not present

## 2019-05-10 DIAGNOSIS — L219 Seborrheic dermatitis, unspecified: Secondary | ICD-10-CM | POA: Diagnosis not present

## 2019-05-10 DIAGNOSIS — L821 Other seborrheic keratosis: Secondary | ICD-10-CM | POA: Diagnosis not present

## 2019-05-10 NOTE — Telephone Encounter (Signed)
Patient's wife called the office today to report that Dr. Bernardo Heater gave them 25mg  Myrbetriq samples to try for one month.  Patient is still getting up 3-4 times a night for urination.  Dr. Bernardo Heater advised them that they should call the office to get 50mg  samples to try for one month.   1 month of Myrbetriq 50mg  samples provided to the patient to try for one month.  They will call back to report efficacy.

## 2019-05-23 ENCOUNTER — Ambulatory Visit (INDEPENDENT_AMBULATORY_CARE_PROVIDER_SITE_OTHER): Payer: PPO

## 2019-05-23 ENCOUNTER — Ambulatory Visit (INDEPENDENT_AMBULATORY_CARE_PROVIDER_SITE_OTHER): Payer: PPO | Admitting: Vascular Surgery

## 2019-05-23 ENCOUNTER — Encounter (INDEPENDENT_AMBULATORY_CARE_PROVIDER_SITE_OTHER): Payer: Self-pay | Admitting: Vascular Surgery

## 2019-05-23 ENCOUNTER — Other Ambulatory Visit: Payer: Self-pay

## 2019-05-23 VITALS — BP 138/81 | HR 58 | Resp 10 | Ht 65.0 in | Wt 221.0 lb

## 2019-05-23 DIAGNOSIS — K559 Vascular disorder of intestine, unspecified: Secondary | ICD-10-CM

## 2019-05-23 DIAGNOSIS — I10 Essential (primary) hypertension: Secondary | ICD-10-CM

## 2019-05-23 DIAGNOSIS — I4891 Unspecified atrial fibrillation: Secondary | ICD-10-CM | POA: Diagnosis not present

## 2019-05-23 NOTE — Assessment & Plan Note (Signed)
blood pressure control important in reducing the progression of atherosclerotic disease. On appropriate oral medications.  

## 2019-05-23 NOTE — Assessment & Plan Note (Signed)
On anticoagulation 

## 2019-05-23 NOTE — Progress Notes (Signed)
Patient ID: Jim Dodson, male   DOB: May 25, 1941, 78 y.o.   MRN: [id number]  Chief Complaint  Patient presents with  . New Patient (Initial Visit)    Jim Dodson is a 78 y.o. male.  I am asked to see the patient by Mellody Memos PA-C for evaluation of ischemic colitis.  The patient had some anemia and blood per rectum back in the summer.  He underwent a colonoscopy with mucosal findings and biopsy consistent with ischemic colitis.  He was never really having much pain.  He denied any postprandial abdominal pain, food fear, or weight loss.  He has very regular bowel habits and that has not changed.  He feels well today without complaints. Duplex today demonstrated normal celiac artery, superior mesenteric artery, and branches without clear visualization of the IMA which is not uncommon with duplex.   Past Medical History:  Diagnosis Date  . A-fib   . Arthritis   . Atrial fibrillation ([place])   . Coronary artery disease    stent placed 2009  . Headache   . History of hiatal hernia   . History of kidney stones   . Hypertension   . Prostate cancer [facility])    prostate cancer, had sx  . Shortness of breath dyspnea     Past Surgical History:  Procedure Laterality Date  . CARDIAC CATHETERIZATION     2009, stent placed  . CARDIOVERSION N/A 03/13/2015   Procedure: CARDIOVERSION;  Surgeon: Jerline Pain, MD;  Location: [place];  Service: Cardiovascular;  Laterality: N/A;  . COLONOSCOPY WITH PROPOFOL N/A 07/19/2015   Procedure: COLONOSCOPY WITH PROPOFOL;  Surgeon: Manya Silvas, MD;  Location: [facility] ENDOSCOPY;  Service: Endoscopy;  Laterality: N/A;  . COLONOSCOPY WITH PROPOFOL N/A 01/27/2019   Procedure: COLONOSCOPY WITH PROPOFOL;  Surgeon: Manya Silvas, MD;  Location: [facility] ENDOSCOPY;  Service: Endoscopy;  Laterality: N/A;  . cryo surgery of prostate    . ESOPHAGOGASTRODUODENOSCOPY N/A 01/27/2019   Procedure: ESOPHAGOGASTRODUODENOSCOPY (EGD);  Surgeon: Manya Silvas,  MD;  Location: [facility] ENDOSCOPY;  Service: Endoscopy;  Laterality: N/A;  . ESOPHAGOGASTRODUODENOSCOPY (EGD) WITH PROPOFOL N/A 07/19/2015   Procedure: ESOPHAGOGASTRODUODENOSCOPY (EGD) WITH PROPOFOL;  Surgeon: Manya Silvas, MD;  Location: [facility] ENDOSCOPY;  Service: Endoscopy;  Laterality: N/A;  . LAPAROTOMY N/A 03/08/2015   Procedure: EXPLORATORY LAPAROTOMY;  Surgeon: Autumn Messing III, MD;  Location: [place];  Service: General;  Laterality: N/A;  . LAPAROTOMY N/A 03/09/2015   Procedure: EXPLORATORY LAPAROTOMY AND EVACUATION OF HEMATOMA;  Surgeon: Autumn Messing III, MD;  Location: [place];  Service: General;  Laterality: N/A;  . LYSIS OF ADHESION N/A 03/08/2015   Procedure: LYSIS OF ADHESION;  Surgeon: Autumn Messing III, MD;  Location: [place];  Service: General;  Laterality: N/A;  . OMENTECTOMY N/A 03/08/2015   Procedure: OMENTECTOMY;  Surgeon: Autumn Messing III, MD;  Location: [place];  Service: General;  Laterality: N/A;  . TEE WITHOUT CARDIOVERSION N/A 03/13/2015   Procedure: TRANSESOPHAGEAL ECHOCARDIOGRAM (TEE);  Surgeon: Jerline Pain, MD;  Location: [place];  Service: Cardiovascular;  Laterality: N/A;  . TOTAL KNEE ARTHROPLASTY Left 04/08/2016   Procedure: LEFT TOTAL KNEE ARTHROPLASTY;  Surgeon: Leandrew Koyanagi, MD;  Location: [place];  Service: Orthopedics;  Laterality: Left;  . UMBILICAL HERNIA REPAIR  03/08/2015   Procedure: UMBILICAL HERNIA REPAIR  ADULT;  Surgeon: Autumn Messing III, MD;  Location: [place];  Service: General;;  . VENTRAL HERNIA REPAIR N/A 03/08/2015  Procedure:  VENTRAL HERNIA  REPAIR ADULT;  Surgeon: Autumn Messing III, MD;  Location: [place];  Service: General;  Laterality: N/A;    Family History No bleeding disorders, clotting disorders, autoimmune diseases, or aneurysms  Social History Social History   Tobacco Use  . Smoking status: Former Smoker    Years: 20.00    Types: Cigarettes    Quit date: 08/03/1988    Years since quitting: 30.8  . Smokeless tobacco: Never Used  Substance Use Topics  . Alcohol  use: No  . Drug use: No     Allergies  Allergen Reactions  . Statins Swelling and Other (See Comments)    Legs swelled up and cramped up really bad    Current Outpatient Medications  Medication Sig Dispense Refill  . apixaban (ELIQUIS) 5 MG TABS tablet Take 5 mg by mouth 2 (two) times daily.    . Cholecalciferol (VITAMIN D3) 5000 units TABS Take 1 tablet by mouth every other day.     . ferrous sulfate 325 (65 FE) MG tablet Take by mouth.    . hydrALAZINE (APRESOLINE) 50 MG tablet Take 1 tablet (50 mg total) by mouth every 8 (eight) hours. 90 tablet 0  . hydrochlorothiazide (HYDRODIURIL) 25 MG tablet Take 25 mg by mouth daily.    Marland Kitchen loperamide (ANTI-DIARRHEAL) 2 MG capsule Take 2 mg by mouth every morning.    . meclizine (ANTIVERT) 12.5 MG tablet Take 12.5 mg by mouth 3 (three) times daily as needed for dizziness.    . memantine (NAMENDA) 10 MG tablet Take 10 mg by mouth 2 (two) times daily.     . metoprolol (LOPRESSOR) 100 MG tablet Take 1 tablet (100 mg total) by mouth 2 (two) times daily. 60 tablet 0  . mirabegron ER (MYRBETRIQ) 50 MG TB24 tablet Take 50 mg by mouth daily.    . Omega-3 Fatty Acids (FISH OIL PO) Take 1,200 mg by mouth 3 (three) times daily.     Marland Kitchen omeprazole (PRILOSEC) 20 MG capsule Take 20 mg by mouth daily.    . Red Yeast Rice Extract 600 MG CAPS Take 1,200 mg by mouth 2 (two) times daily.    . tamsulosin (FLOMAX) 0.4 MG CAPS capsule Take 1 capsule (0.4 mg total) by mouth 2 (two) times a day. (Patient taking differently: Take 0.4 mg by mouth daily. ) 60 capsule 11  . Nutritional Supplements (NUTRITIONAL SUPPLEMENT PO) Take 2 tablets by mouth 3 (three) times daily. [place]: 40% ketosterones/ 20% 3-ketosterone      No current facility-administered medications for this visit.       REVIEW OF SYSTEMS (Negative unless checked)  Constitutional: [] Weight loss  [] Fever  [] Chills Cardiac: [] Chest pain   [] Chest pressure   [] Palpitations   [] Shortness of breath when laying  flat   [] Shortness of breath at rest   [] Shortness of breath with exertion. Vascular:  [] Pain in legs with walking   [] Pain in legs at rest   [] Pain in legs when laying flat   [] Claudication   [] Pain in feet when walking  [] Pain in feet at rest  [] Pain in feet when laying flat   [] History of DVT   [] Phlebitis   [] Swelling in legs   [] Varicose veins   [] Non-healing ulcers Pulmonary:   [] Uses home oxygen   [] Productive cough   [] Hemoptysis   [] Wheeze  [] COPD   [] Asthma Neurologic:  [] Dizziness  [] Blackouts   [] Seizures   [] History of stroke   [] History of TIA  []   Aphasia   [] Temporary blindness   [] Dysphagia   [] Weakness or numbness in arms   [] Weakness or numbness in legs Musculoskeletal:  [x] Arthritis   [] Joint swelling   [] Joint pain   [] Low back pain Hematologic:  [] Easy bruising  [] Easy bleeding   [] Hypercoagulable state   [x] Anemic  [] Hepatitis Gastrointestinal:  [] Blood in stool   [] Vomiting blood  [] Gastroesophageal reflux/heartburn   [] Abdominal pain Genitourinary:  [] Chronic kidney disease   [] Difficult urination  [] Frequent urination  [] Burning with urination   [] Hematuria Skin:  [] Rashes   [] Ulcers   [] Wounds Psychological:  [] History of anxiety   []  History of major depression.    Physical Exam BP 138/81 (BP Location: Left Arm, Patient Position: Sitting, Cuff Size: Normal)   Pulse (!) 58   Resp 10   Ht 5\' 5"  (1.651 m)   Wt 221 lb (100.2 kg)   BMI 36.78 kg/m  Gen:  WD/WN, NAD Head: [place]/AT, No temporalis wasting. Ear/Nose/Throat: Hearing grossly intact, nares w/o erythema or drainage, oropharynx w/o Erythema/Exudate Eyes: Conjunctiva clear, sclera non-icteric  Neck: trachea midline.  No JVD.  Pulmonary:  Good air movement, respirations not labored, no use of accessory muscles  Cardiac: RRR, no JVD Vascular:  Vessel Right Left  Radial Palpable Palpable                                   Gastrointestinal:. No masses, surgical incisions, or scars. Musculoskeletal: M/S 5/5  throughout.  Extremities without ischemic changes.  No deformity or atrophy.  No significant lower extremity edema. Neurologic: Sensation grossly intact in extremities.  Symmetrical.  Speech is fluent. Motor exam as listed above. Psychiatric: Judgment intact, Mood & affect appropriate for pt's clinical situation. Dermatologic: No rashes or ulcers noted.  No cellulitis or open wounds.    Radiology No results found.  Labs Recent Results (from the past 2160 hour(s))  Urinalysis, Complete     Status: None   Collection Time: 04/20/19  1:23 PM  Result Value Ref Range   Specific Gravity, UA 1.025 1.005 - 1.030   pH, UA 6.5 5.0 - 7.5   Color, UA Yellow Yellow   Appearance Ur Clear Clear   Leukocytes,UA Negative Negative   Protein,UA Negative Negative/Trace   Glucose, UA Negative Negative   Ketones, UA Negative Negative   RBC, UA Negative Negative   Bilirubin, UA Negative Negative   Urobilinogen, Ur 0.2 0.2 - 1.0 mg/dL   Nitrite, UA Negative Negative   Microscopic Examination See below:   Microscopic Examination     Status: Abnormal   Collection Time: 04/20/19  1:23 PM   URINE  Result Value Ref Range   WBC, UA 0-5 0 - 5 /hpf   RBC 0-2 0 - 2 /hpf   Epithelial Cells (non renal) 0-10 0 - 10 /hpf   Casts Present (A) None seen /lpf   Cast Type Hyaline casts N/A   Bacteria, UA None seen None seen/Few    Assessment/Plan:  Atrial fibrillation with rapid ventricular response (HCC) On anticoagulation  Essential hypertension blood pressure control important in reducing the progression of atherosclerotic disease. On appropriate oral medications.   Ischemic colitis [facility]) The patient had ischemic colitis the summer that was seen on colonoscopy.  Duplex today demonstrated normal celiac artery, superior mesenteric artery, and branches without clear visualization of the IMA which is not uncommon with duplex.  Given these findings, there is no role for  vascular intervention at this time.  The  ischemic colitis is likely due largely to small vessel disease and there is no role for revascularization in those situations.  I will see the patient back as needed.  We recommended avoiding hypotension or dehydration which could prompt a low flow state to the colon.  He will contact our office with any problems      Leotis Pain 05/23/2019, 10:30 AM   This note was created with Dragon medical transcription system.  Any errors from dictation are unintentional.

## 2019-05-23 NOTE — Patient Instructions (Signed)
Ischemic Colitis  Ischemic colitis is damage to the large intestine due to reduced blood flow (ischemia) to the colon. The colon is the last section of the large intestine, where stool is formed. The reduced blood flow may lead to the death of cells (necrosis) in the lining of the colon, damaging the colon and often causing bleeding. Most cases of ischemic colitis clear up in a few days with treatment. In other cases, blood flow does not improve, and parts of the colon start to die. This is extremely serious and even life-threatening. If this happens, surgery may be required. In some cases, parts of the colon may need to be removed. What are the causes? Ischemic colitis results from a decrease in the blood supply to the colon. Many conditions can cause this, such as:  Heart problems that reduce blood flow to the arteries that supply the colon. These include problems such as coronary heart disease, peripheral vascular disease, atrial fibrillation, and congestive heart failure.  Low blood pressure from: ? An infection that spreads to the blood (sepsis). ? Dehydration or bleeding (shock).  Drugs that narrow blood vessels (vasoconstrictors). Sometimes the cause is not known. What increases the risk? You are more likely to develop this condition if:  You are 77 years of age or older.  You are male.  You have another medical condition, such as: ? Heart disease. ? Diabetes. ? Kidney disease that requires you to be on dialysis. ? A disease that causes blood clots.  You are frequently constipated.  You have had surgery on the heart, blood vessels (such as the aorta), or colon.  You take certain medicines or drugs, such as: ? Medicines that suppress your immune system (immunomodulators). ? Medicines that cause constipation. ? Illegal drugs, such as cocaine or methamphetamines.  You get an extreme amount of exercise from long-distance bike riding or running. What are the signs or  symptoms? Symptoms of this condition start suddenly and may include:  Dull pain, usually on the left side of the abdomen.  Tenderness of the abdomen.  Abdomen (abdominal) cramps.  An urgent need to have a bowel movement.  Loose, bloody stools with clots of dark or bright red blood.  Nausea and vomiting.  Fever.  Weakness, fatigue, and confusion. How is this diagnosed? This condition may be diagnosed based on:  Your symptoms, your medical history, and a physical exam.  Tests to find out more about your condition and to rule out other causes of pain and bleeding. These tests may include: ? Blood tests to check for clotting, blood loss, and low proteins in your blood. ? CT scan of the colon. ? A procedure to examine the inside of your colon using a scope that is passed through the rectum (colonoscopy). Colonoscopy is the most important diagnostic test. During this test, your health care provider may take a small piece of tissue from your colon to be examined under a microscope (biopsy). How is this treated? You may be hospitalized for treatment. Treatment usually includes:  Not eating or drinking anything. This allows the colon to rest.  IV fluids to maintain blood pressure, regulate blood minerals (electrolytes), and provide nutrition.  Having a tube inserted into your stomach through your nose (nasogastric tube) to drain your stomach.  IV antibiotic medicines. These may be used if an infection is suspected.  Stopping or changing medicines that may be causing the condition. You may need surgery if your condition is severe or if it gets worse  or does not get better after a few days. Parts of the colon that will not recover may need to be removed. In some cases, a procedure is also done to attach the healthy part of the colon to the outer wall of the abdomen to drain stool (colostomy). Follow these instructions at home:  Follow instructions from your health care provider about  eating or drinking restrictions.  Drink enough fluid to keep your urine clear or pale yellow.  Take over-the-counter and prescription medicines only as told by your health care provider.  Return to your normal activities as told by your health care provider. Ask your health care provider what activities are safe for you.  Do not use any products that contain nicotine or tobacco, such as cigarettes and e-cigarettes. If you need help quitting, ask your health care provider.  Keep all follow-up visits as told by your health care provider. This is important. Contact a health care provider if:  You have blood in your stool.  You have abdominal pain or cramps.  You have constipation.  You have nausea or vomiting. Get help right away if:  You have a moderate to large amount of loose, bloody stools with clots of dark or bright red blood.  You have severe abdominal pain.  Your abdominal pain has not improved after 24 hours.  You have a fever.  You have not been able to have a bowel movement, and you are in pain and vomiting.  You have shortness of breath.  You are very tired (lethargic) or have confusion. Summary  Ischemic colitis is damage to the large intestine due to reduced blood flow (ischemia) to the colon.  Some of the symptoms of this condition include abdominal pain or tenderness, bloody stools, and an urgent need to have a bowel movement.  Diagnosis usually includes a procedure to examine the inside of the colon using a scope that is passed through the rectum (colonoscopy). This information is not intended to replace advice given to you by your health care provider. Make sure you discuss any questions you have with your health care provider. Document Released: 09/07/2016 Document Revised: 11/11/2018 Document Reviewed: 09/07/2016 Elsevier Patient Education  2020 [employer].

## 2019-05-23 NOTE — Assessment & Plan Note (Signed)
The patient had ischemic colitis the summer that was seen on colonoscopy.  Duplex today demonstrated normal celiac artery, superior mesenteric artery, and branches without clear visualization of the IMA which is not uncommon with duplex.  Given these findings, there is no role for vascular intervention at this time.  The ischemic colitis is likely due largely to small vessel disease and there is no role for revascularization in those situations.  I will see the patient back as needed.  We recommended avoiding hypotension or dehydration which could prompt a low flow state to the colon.  He will contact our office with any problems

## 2019-06-07 ENCOUNTER — Other Ambulatory Visit: Payer: Self-pay

## 2019-06-08 ENCOUNTER — Other Ambulatory Visit: Payer: Self-pay

## 2019-06-08 ENCOUNTER — Inpatient Hospital Stay: Payer: PPO | Attending: Radiation Oncology

## 2019-06-08 DIAGNOSIS — C61 Malignant neoplasm of prostate: Secondary | ICD-10-CM | POA: Insufficient documentation

## 2019-06-08 LAB — PSA: Prostatic Specific Antigen: 0.02 ng/mL (ref 0.00–4.00)

## 2019-06-14 ENCOUNTER — Telehealth: Payer: Self-pay | Admitting: Urology

## 2019-06-14 DIAGNOSIS — R32 Unspecified urinary incontinence: Secondary | ICD-10-CM

## 2019-06-14 MED ORDER — MIRABEGRON ER 50 MG PO TB24: 50.0000 mg | ORAL_TABLET | Freq: Every day | ORAL | 11 refills | Status: DC

## 2019-06-14 NOTE — Telephone Encounter (Signed)
Pt would like an RX called in for Myrbetriq 50mg .

## 2019-06-14 NOTE — Telephone Encounter (Signed)
RX sent to pharmacy for 50mg 

## 2019-06-15 ENCOUNTER — Other Ambulatory Visit: Payer: Self-pay | Admitting: *Deleted

## 2019-06-15 ENCOUNTER — Ambulatory Visit
Admission: RE | Admit: 2019-06-15 | Discharge: 2019-06-15 | Disposition: A | Discharge status: Home / Self Care | Payer: PPO | Source: Ambulatory Visit | Attending: Radiation Oncology | Admitting: Radiation Oncology

## 2019-06-15 ENCOUNTER — Telehealth: Payer: Self-pay | Admitting: Urology

## 2019-06-15 ENCOUNTER — Other Ambulatory Visit: Payer: Self-pay

## 2019-06-15 ENCOUNTER — Encounter: Payer: Self-pay | Admitting: Radiation Oncology

## 2019-06-15 DIAGNOSIS — R351 Nocturia: Secondary | ICD-10-CM | POA: Insufficient documentation

## 2019-06-15 DIAGNOSIS — C61 Malignant neoplasm of prostate: Secondary | ICD-10-CM | POA: Diagnosis not present

## 2019-06-15 DIAGNOSIS — Z923 Personal history of irradiation: Secondary | ICD-10-CM | POA: Diagnosis not present

## 2019-06-15 NOTE — Telephone Encounter (Signed)
Called pt's wife she states that they are currently in "the donut whole" with insurance therefore they are having to pay full price for all medications. Advised wife that if this is the case sending in a different medication wouldn't be any more cost effective. Pt's wife gave verbal understanding. Wife states that their rx benefits restart in January and medication should be more cost effective.

## 2019-06-15 NOTE — Telephone Encounter (Signed)
Pt's wife LMOM and states that they can not afford the Myrbetriq that was called in and would like a call back to discuss medication options.

## 2019-06-15 NOTE — Progress Notes (Signed)
Radiation Oncology Follow up Note  Name: Jim Dodson   Date:   06/15/2019 MRN:  [id number] DOB: Oct 20, 1940    This 78 y.o. male presents to the clinic today for 12-month follow-up status post salvage radiation therapy after cryotherapy in patient with known stage IIIb adenocarcinoma the prostate.  REFERRING PROVIDER: Shon Baton, MD  HPI: Patient is a 78 year old male now at 10 months having completed IMRT salvage radiation therapy status post cryotherapy for patient with known stage IIIb adenocarcinoma the prostate seen today in routine follow-up he is doing well he still has significant nocturia Mybetric was working well although the cost of the medication was prohibitive.  I have suggested cranberry juice is much as possible for that.  His most recent PSA is 0.02.  Patient was on Flomax although without significant benefit.  COMPLICATIONS OF TREATMENT: none  FOLLOW UP COMPLIANCE: keeps appointments   PHYSICAL EXAM:  BP (P) 117/79 (BP Location: Left Arm, Patient Position: Sitting)   Pulse (P) 83   Temp (P) 97.6 F (36.4 C) (Tympanic)   Resp (P) 18   Wt (P) 221 lb 9.6 oz (100.5 kg)   BMI (P) 36.88 kg/m  Well-developed well-nourished patient in NAD. HEENT reveals PERLA, EOMI, discs not visualized.  Oral cavity is clear. No oral mucosal lesions are identified. Neck is clear without evidence of cervical or supraclavicular adenopathy. Lungs are clear to A&P. Cardiac examination is essentially unremarkable with regular rate and rhythm without murmur rub or thrill. Abdomen is benign with no organomegaly or masses noted. Motor sensory and DTR levels are equal and symmetric in the upper and lower extremities. Cranial nerves II through XII are grossly intact. Proprioception is intact. No peripheral adenopathy or edema is identified. No motor or sensory levels are noted. Crude visual fields are within normal range.  RADIOLOGY RESULTS: No current films for review  PLAN: Present time patient  is under excellent biochemical control of his prostate cancer after salvage treatment.  I have suggested cranberry juice is much as possible.  Of asked him to contact the drug company that supplies the mybetric to see if there is patient's assistance.  I otherwise have asked to see him back in 1 year for follow-up.  Patient knows to call with any concerns.  I would like to take this opportunity to thank you for allowing me to participate in the care of your patient.Noreene Filbert, MD

## 2019-06-16 NOTE — Telephone Encounter (Signed)
You are correct.  Bethanechol is for urinary retention although not very effective.  If anything it would worsen his urinary symptoms.

## 2019-06-16 NOTE — Telephone Encounter (Signed)
Patient's wife called back and stated that his insurance said that they could try Bethanecol if this is to expensive then they will wait until January and he will be out of the donut hole. Please call her back    [place]

## 2019-06-16 NOTE — Telephone Encounter (Signed)
I don't think this medication will be helpful as it seems to be for retention? Please advise. Thanks

## 2019-06-16 NOTE — Telephone Encounter (Signed)
Called pt's wife informed her of the information below. Wife gave verbal understanding, she states that pt will resume medication in January when insurance will cover it.

## 2019-06-20 DIAGNOSIS — C61 Malignant neoplasm of prostate: Secondary | ICD-10-CM | POA: Diagnosis not present

## 2019-06-20 DIAGNOSIS — I5032 Chronic diastolic (congestive) heart failure: Secondary | ICD-10-CM | POA: Diagnosis not present

## 2019-06-20 DIAGNOSIS — M25562 Pain in left knee: Secondary | ICD-10-CM | POA: Diagnosis not present

## 2019-06-20 DIAGNOSIS — K148 Other diseases of tongue: Secondary | ICD-10-CM | POA: Diagnosis not present

## 2019-06-20 DIAGNOSIS — R399 Unspecified symptoms and signs involving the genitourinary system: Secondary | ICD-10-CM | POA: Diagnosis not present

## 2019-06-20 DIAGNOSIS — D696 Thrombocytopenia, unspecified: Secondary | ICD-10-CM | POA: Diagnosis not present

## 2019-06-20 DIAGNOSIS — Z8719 Personal history of other diseases of the digestive system: Secondary | ICD-10-CM | POA: Diagnosis not present

## 2019-06-20 DIAGNOSIS — D649 Anemia, unspecified: Secondary | ICD-10-CM | POA: Diagnosis not present

## 2019-06-20 DIAGNOSIS — I480 Paroxysmal atrial fibrillation: Secondary | ICD-10-CM | POA: Diagnosis not present

## 2019-06-20 DIAGNOSIS — M25511 Pain in right shoulder: Secondary | ICD-10-CM | POA: Diagnosis not present

## 2019-06-20 DIAGNOSIS — I110 Hypertensive heart disease with heart failure: Secondary | ICD-10-CM | POA: Diagnosis not present

## 2019-06-20 DIAGNOSIS — R627 Adult failure to thrive: Secondary | ICD-10-CM | POA: Diagnosis not present

## 2019-06-28 DIAGNOSIS — D509 Iron deficiency anemia, unspecified: Secondary | ICD-10-CM | POA: Diagnosis not present

## 2019-08-16 ENCOUNTER — Ambulatory Visit: Payer: PPO | Admitting: Orthopaedic Surgery

## 2019-08-16 ENCOUNTER — Encounter: Payer: Self-pay | Admitting: Orthopaedic Surgery

## 2019-08-16 ENCOUNTER — Other Ambulatory Visit: Payer: Self-pay

## 2019-08-16 ENCOUNTER — Ambulatory Visit (INDEPENDENT_AMBULATORY_CARE_PROVIDER_SITE_OTHER): Payer: PPO

## 2019-08-16 DIAGNOSIS — M1711 Unilateral primary osteoarthritis, right knee: Secondary | ICD-10-CM

## 2019-08-16 NOTE — Progress Notes (Signed)
Office Visit Note   Patient: Jim Dodson           Date of Birth: 01/04/1941           MRN: [id number] Visit Date: 08/16/2019              Requested by: Jim Dodson, [address] [place],  [place] [address] PCP: Jim Baton, MD   Assessment & Plan: Visit Diagnoses:  1. Primary osteoarthritis of right knee     Plan: My impression is end-stage right knee DJD.  Overall his pain seems to be well controlled.  He understands that elective joint replacements are postponed indefinitely for now.  He is not interested in any cortisone injections today.  He will call back when he has ready to schedule knee replacement as well as we are able to perform them.  He will need preoperative cardiac clearance from Dr. Ubaldo Glassing at [facility].  Questions encouraged and answered.  Follow-up as needed.  Follow-Up Instructions: Return if symptoms worsen or fail to improve.   Orders:  Orders Placed This Encounter  Procedures  . XR KNEE 3 VIEW RIGHT   No orders of the defined types were placed in this encounter.     Procedures: No procedures performed   Clinical Data: No additional findings.   Subjective: Chief Complaint  Patient presents with  . Right Knee - Pain    Jim Dodson comes in today for evaluation of right knee pain.  He states he has mild to moderate pain with episodes of giving out.  He is 3 years 4 months status post a left total knee replacement from which he did well.  He takes Tylenol for the pain.  He does take Eliquis for chronic atrial fibrillation.  He has no complaints otherwise.   Review of Systems  Constitutional: Negative.   All other systems reviewed and are negative.    Objective: Vital Signs: There were no vitals taken for this visit.  Physical Exam Vitals and nursing note reviewed.  Constitutional:      Appearance: He is well-developed.  Pulmonary:     Effort: Pulmonary effort is normal.  Abdominal:     Palpations: Abdomen is soft.  Skin:   General: Skin is warm.  Neurological:     Mental Status: He is alert and oriented to person, place, and time.  Psychiatric:        Behavior: Behavior normal.        Thought Content: Thought content normal.        Judgment: Judgment normal.     Ortho Exam Right knee exam shows trace joint effusion.  Good range of motion with patellofemoral crepitus.  Collaterals and cruciates are stable. Specialty Comments:  No specialty comments available.  Imaging: XR KNEE 3 VIEW RIGHT  Result Date: 08/16/2019 Bone-on-bone joint space narrowing of the medial compartment.    PMFS History: Patient Active Problem List   Diagnosis Date Noted  . Primary osteoarthritis of right knee 08/16/2019  . Iron deficiency anemia, unspecified 02/09/2019  . Ischemic colitis ([place]) 02/09/2019  . Hyperlipidemia 04/20/2018  . Total knee replacement status 04/08/2016  . Abnormal finding of diagnostic imaging 02/28/2016  . Sepsis ([place]) 07/25/2015  . On continuous oral anticoagulation 07/25/2015  . Essential hypertension 07/25/2015  . GERD (gastroesophageal reflux disease) 07/25/2015  . Acute upper respiratory infection 07/25/2015  . Bladder outlet obstruction 05/28/2015  . Incarcerated ventral hernia 03/14/2015  . Atrial fibrillation with rapid ventricular response ([place])   .  Chronic coronary artery disease   . Elevated troponin   . Patient receiving intravenous heparin therapy   . SBO (small bowel obstruction) ([place]) 03/08/2015  . Male urinary stress incontinence 08/17/2012  . ED (erectile dysfunction) of organic origin 03/30/2012  . Incomplete emptying of bladder 03/30/2012  . Inguinal hernia 03/30/2012  . Kidney stone 03/30/2012  . Malignant neoplasm of prostate ([place]) 03/30/2012   Past Medical History:  Diagnosis Date  . A-fib   . Arthritis   . Atrial fibrillation ([place])   . Coronary artery disease    stent placed 2009  . Headache   . History of hiatal hernia   . History of kidney stones   .  Hypertension   . Prostate cancer [facility])    prostate cancer, had sx  . Shortness of breath dyspnea     History reviewed. No pertinent family history.  Past Surgical History:  Procedure Laterality Date  . CARDIAC CATHETERIZATION     2009, stent placed  . CARDIOVERSION N/A 03/13/2015   Procedure: CARDIOVERSION;  Surgeon: Jerline Pain, MD;  Location: [facility];  Service: Cardiovascular;  Laterality: N/A;  . COLONOSCOPY WITH PROPOFOL N/A 07/19/2015   Procedure: COLONOSCOPY WITH PROPOFOL;  Surgeon: Manya Silvas, MD;  Location: [facility] ENDOSCOPY;  Service: Endoscopy;  Laterality: N/A;  . COLONOSCOPY WITH PROPOFOL N/A 01/27/2019   Procedure: COLONOSCOPY WITH PROPOFOL;  Surgeon: Manya Silvas, MD;  Location: [facility] ENDOSCOPY;  Service: Endoscopy;  Laterality: N/A;  . cryo surgery of prostate    . ESOPHAGOGASTRODUODENOSCOPY N/A 01/27/2019   Procedure: ESOPHAGOGASTRODUODENOSCOPY (EGD);  Surgeon: Manya Silvas, MD;  Location: [facility] ENDOSCOPY;  Service: Endoscopy;  Laterality: N/A;  . ESOPHAGOGASTRODUODENOSCOPY (EGD) WITH PROPOFOL N/A 07/19/2015   Procedure: ESOPHAGOGASTRODUODENOSCOPY (EGD) WITH PROPOFOL;  Surgeon: Manya Silvas, MD;  Location: [facility] ENDOSCOPY;  Service: Endoscopy;  Laterality: N/A;  . LAPAROTOMY N/A 03/08/2015   Procedure: EXPLORATORY LAPAROTOMY;  Surgeon: Autumn Messing III, MD;  Location: [facility];  Service: General;  Laterality: N/A;  . LAPAROTOMY N/A 03/09/2015   Procedure: EXPLORATORY LAPAROTOMY AND EVACUATION OF HEMATOMA;  Surgeon: Autumn Messing III, MD;  Location: [facility];  Service: General;  Laterality: N/A;  . LYSIS OF ADHESION N/A 03/08/2015   Procedure: LYSIS OF ADHESION;  Surgeon: Autumn Messing III, MD;  Location: [place];  Service: General;  Laterality: N/A;  . OMENTECTOMY N/A 03/08/2015   Procedure: OMENTECTOMY;  Surgeon: Autumn Messing III, MD;  Location: [facility];  Service: General;  Laterality: N/A;  . TEE WITHOUT CARDIOVERSION N/A 03/13/2015   Procedure: TRANSESOPHAGEAL ECHOCARDIOGRAM (TEE);   Surgeon: Jerline Pain, MD;  Location: [place];  Service: Cardiovascular;  Laterality: N/A;  . TOTAL KNEE ARTHROPLASTY Left 04/08/2016   Procedure: LEFT TOTAL KNEE ARTHROPLASTY;  Surgeon: Leandrew Koyanagi, MD;  Location: [place];  Service: Orthopedics;  Laterality: Left;  . UMBILICAL HERNIA REPAIR  03/08/2015   Procedure: UMBILICAL HERNIA REPAIR  ADULT;  Surgeon: Autumn Messing III, MD;  Location: [place];  Service: General;;  . VENTRAL HERNIA REPAIR N/A 03/08/2015   Procedure:  VENTRAL HERNIA  REPAIR ADULT;  Surgeon: Autumn Messing III, MD;  Location: [place];  Service: General;  Laterality: N/A;   Social History   Occupational History  . Not on file  Tobacco Use  . Smoking status: Former Smoker    Years: 20.00    Types: Cigarettes    Quit date: 08/03/1988    Years since quitting: 31.0  . Smokeless tobacco: Never Used  Substance and Sexual Activity  . Alcohol use: No  . Drug use: No  . Sexual activity: Yes

## 2019-10-04 DIAGNOSIS — I4891 Unspecified atrial fibrillation: Secondary | ICD-10-CM | POA: Diagnosis not present

## 2019-10-04 DIAGNOSIS — I10 Essential (primary) hypertension: Secondary | ICD-10-CM | POA: Diagnosis not present

## 2019-10-04 DIAGNOSIS — E782 Mixed hyperlipidemia: Secondary | ICD-10-CM | POA: Diagnosis not present

## 2019-10-04 DIAGNOSIS — D509 Iron deficiency anemia, unspecified: Secondary | ICD-10-CM | POA: Diagnosis not present

## 2019-10-04 DIAGNOSIS — I25118 Atherosclerotic heart disease of native coronary artery with other forms of angina pectoris: Secondary | ICD-10-CM | POA: Diagnosis not present

## 2019-10-04 DIAGNOSIS — K219 Gastro-esophageal reflux disease without esophagitis: Secondary | ICD-10-CM | POA: Diagnosis not present

## 2019-11-08 ENCOUNTER — Other Ambulatory Visit: Payer: Self-pay

## 2019-11-08 ENCOUNTER — Ambulatory Visit: Payer: PPO | Admitting: Dermatology

## 2019-11-08 DIAGNOSIS — L821 Other seborrheic keratosis: Secondary | ICD-10-CM | POA: Diagnosis not present

## 2019-11-08 DIAGNOSIS — L570 Actinic keratosis: Secondary | ICD-10-CM | POA: Diagnosis not present

## 2019-11-08 DIAGNOSIS — L578 Other skin changes due to chronic exposure to nonionizing radiation: Secondary | ICD-10-CM

## 2019-11-08 NOTE — Progress Notes (Signed)
   Follow-Up Visit   Subjective  Jim Dodson is a 79 y.o. male who presents for the following: Actinic Keratosis (of the scalp and face - patient hasn't noticed any new or changing moles, lesions, or spots. ).  The following portions of the chart were reviewed this encounter and updated as appropriate:    Review of Systems: No other skin or systemic complaints.  Objective  Well appearing patient in no apparent distress; mood and affect are within normal limits.  A focused examination was performed including face, scalp, arms and hands. Relevant physical exam findings are noted in the Assessment and Plan.  Objective  scalp x 1: Erythematous thin papules/macules with gritty scale.   Assessment & Plan  AK (actinic keratosis) scalp x 1  Destruction of lesion - scalp x 1 Complexity: simple   Destruction method: cryotherapy   Informed consent: discussed and consent obtained   Timeout:  patient name, date of birth, surgical site, and procedure verified Lesion destroyed using liquid nitrogen: Yes   Region frozen until ice ball extended beyond lesion: Yes   Outcome: patient tolerated procedure well with no complications   Post-procedure details: wound care instructions given    Actinic Damage - diffuse scaly erythematous macules with underlying dyspigmentation - Recommend daily broad spectrum sunscreen SPF 30+ to sun-exposed areas, reapply every 2 hours as needed.  - Call for new or changing lesions.  Seborrheic Keratoses - Stuck-on, waxy, tan-brown papules and plaques  - Discussed benign etiology and prognosis. - Observe - Call for any changes   Return in about 6 months (around 05/09/2020) for TBSE.   Tanja Port, MD, am acting as scribe for Sarina Ser, MD .

## 2019-11-09 ENCOUNTER — Encounter: Payer: Self-pay | Admitting: Dermatology

## 2019-11-13 ENCOUNTER — Telehealth: Payer: Self-pay | Admitting: Orthopaedic Surgery

## 2019-11-13 NOTE — Telephone Encounter (Signed)
Patient's wife Jim Dodson called asking when can surgery be done because they are going on vacation July 24th thru July 31st and do not want to get surgery then. Sherian also asked if there is a 10 to 12 week recovery period after the surgery?  The number to contact Jim Dodson is [phone]

## 2019-11-15 NOTE — Telephone Encounter (Signed)
I called wife and scheduled surgery.

## 2019-11-16 ENCOUNTER — Telehealth: Payer: Self-pay | Admitting: Orthopaedic Surgery

## 2019-11-16 NOTE — Telephone Encounter (Signed)
Patient's wife called stating that she is concerned about the patient's dementia for after care in regards to medication. She would like to speak to you in regards to the aftercare.  CB#[phone].  Thank you.

## 2019-11-17 NOTE — Telephone Encounter (Signed)
Tried calling.  Could not leave voicemail.

## 2019-11-22 ENCOUNTER — Other Ambulatory Visit: Payer: Self-pay

## 2019-11-29 ENCOUNTER — Telehealth: Payer: Self-pay | Admitting: Orthopaedic Surgery

## 2019-11-29 ENCOUNTER — Encounter (HOSPITAL_COMMUNITY): Payer: Self-pay

## 2019-11-29 NOTE — Telephone Encounter (Signed)
I called and advised per Dr. Erlinda Hong stop Eliquis 3 days before surgery.  Per my conversation with Dr. Bethanne Ginger office, patient will be cleared and they will fax form tomorrow.

## 2019-11-29 NOTE — Telephone Encounter (Signed)
Patient's wife called again.   Refer to last message.

## 2019-11-29 NOTE — Telephone Encounter (Signed)
Holding for you. Previous message has been sent to [facility] to advise. Wife is wanting to know if clearance has been obtained for surgery and directions for medication pre-op.

## 2019-11-29 NOTE — Telephone Encounter (Signed)
Pts wife [name] called wanting to know if Dr. Erlinda Hong had gotten the surgery cleared with the pts heart dr. and she also wanted to know when the pt is supposed to stop taking his blood thinners. [name] would like a call back with these answers.   [phone]

## 2019-11-29 NOTE — Telephone Encounter (Signed)
Can you advise? 

## 2019-11-29 NOTE — Progress Notes (Signed)
[facility] - [place], [place] - [address] [address] Phone: [phone] Fax: [phone]      Your procedure is scheduled on May 3  Report to [facility] Main Entrance "A" at 0750 A.M., and check in at the Admitting office.  Call this number if you have problems the morning of surgery:  [phone]  Call [phone] if you have any questions prior to your surgery date Monday-Friday 8am-4pm    Remember:  Do not eat  after midnight the night before your surgery  You may drink clear liquids until 0650 am the morning of your surgery.   Clear liquids allowed are: Water, Non-Citrus Juices (without pulp), Carbonated Beverages, Clear Tea, Black Coffee Only, and Gatorade   Enhanced Recovery after Surgery for Orthopedics Enhanced Recovery after Surgery is a protocol used to improve the stress on your body and your recovery after surgery.  Patient Instructions  . The night before surgery:  o No food after midnight. ONLY clear liquids after midnight  .  Marland Kitchen The day of surgery (if you do NOT have diabetes):  o Drink ONE (1) Pre-Surgery Clear Ensure as directed.   o This drink was given to you during your hospital  pre-op appointment visit. o The pre-op nurse will instruct you on the time to drink the  Pre-Surgery Ensure depending on your surgery time. Drink by [id number] am o Finish the drink at the designated time by the pre-op nurse.  o Nothing else to drink after completing the  Pre-Surgery Clear Ensure.         If you have questions, please contact your surgeon's office.     Take these medicines the morning of surgery with A SIP OF WATER  acetaminophen (TYLENOL)  If needed Eye drops if needed hydrALAZINE (APRESOLINE) loratadine (CLARITIN) meclizine (ANTIVERT)  memantine (NAMENDA) metoprolol (LOPRESSOR) mirabegron ER (MYRBETRIQ) omeprazole (PRILOSEC)  tamsulosin (FLOMAX)  Follow your surgeon's instructions on when to stop apixaban  (ELIQUIS).  If no instructions were given by your surgeon then you will need to call the office to get those instructions.     As of today, STOP taking any Aspirin (unless otherwise instructed by your surgeon) and Aspirin containing products, Aleve, Naproxen, Ibuprofen, Motrin, Advil, Goody's, BC's, all herbal medications, fish oil, and all vitamins.                      Do not wear jewelry            Do not wear lotions, powders, colognes, or deodorant.            Men may shave face and neck.            Do not bring valuables to the hospital.            [facility] is not responsible for any belongings or valuables.  Do NOT Smoke (Tobacco/Vapping) or drink Alcohol 24 hours prior to your procedure If you use a CPAP at night, you may bring all equipment for your overnight stay.   Contacts, glasses, dentures or bridgework may not be worn into surgery.      For patients admitted to the hospital, discharge time will be determined by your treatment team.   Patients discharged the day of surgery will not be allowed to drive home, and someone needs to stay with them for 24 hours.    Special instructions:   [facility]- Preparing For Surgery  Before surgery, you can play an important role. Because skin is not sterile, your skin needs to be as free of germs as possible. You can reduce the number of germs on your skin by washing with CHG (chlorahexidine gluconate) Soap before surgery.  CHG is an antiseptic cleaner which kills germs and bonds with the skin to continue killing germs even after washing.    Oral Hygiene is also important to reduce your risk of infection.  Remember - BRUSH YOUR TEETH THE MORNING OF SURGERY WITH YOUR REGULAR TOOTHPASTE  Please do not use if you have an allergy to CHG or antibacterial soaps. If your skin becomes reddened/irritated stop using the CHG.  Do not shave (including legs and underarms) for at least 48 hours prior to first CHG shower. It is OK to shave your  face.  Please follow these instructions carefully.   1. Shower the NIGHT BEFORE SURGERY and the MORNING OF SURGERY with CHG Soap.   2. If you chose to wash your hair, wash your hair first as usual with your normal shampoo.  3. After you shampoo, rinse your hair and body thoroughly to remove the shampoo.  4. Use CHG as you would any other liquid soap. You can apply CHG directly to the skin and wash gently with a scrungie or a clean washcloth.   5. Apply the CHG Soap to your body ONLY FROM THE NECK DOWN.  Do not use on open wounds or open sores. Avoid contact with your eyes, ears, mouth and genitals (private parts). Wash Face and genitals (private parts)  with your normal soap.   6. Wash thoroughly, paying special attention to the area where your surgery will be performed.  7. Thoroughly rinse your body with warm water from the neck down.  8. DO NOT shower/wash with your normal soap after using and rinsing off the CHG Soap.  9. Pat yourself dry with a CLEAN TOWEL.  10. Wear CLEAN PAJAMAS to bed the night before surgery, wear comfortable clothes the morning of surgery  11. Place CLEAN SHEETS on your bed the night of your first shower and DO NOT SLEEP WITH PETS.   Day of Surgery:   Do not apply any deodorants/lotions.  Please wear clean clothes to the hospital/surgery center.   Remember to brush your teeth WITH YOUR REGULAR TOOTHPASTE.   Please read over the following fact sheets that you were given.

## 2019-11-29 NOTE — Progress Notes (Signed)
Patient's wife called stating her husband has surgery scheduled on Monday and they have not received any instructions on when to stop his medications, as he is on a blood thinner.  Patient has a PAT appointment tomorrow 11/30/19.  I told her they would give her instructions regarding medications tomorrow at that appointment.  After looking at the patient's medication list, I advised the patient's wife to hold fish oil, vitamins, and red yeast rice supplement starting today.  I advised patient's wife to contact the surgeon's office regarding instructions for the Eliquis.  Patient's wife verbalized understanding of instructions.  Patient's wife also stated patient has memory impairment and history of dementia and would need to be present at the PAT appointment.

## 2019-11-30 ENCOUNTER — Encounter (HOSPITAL_COMMUNITY)
Admission: RE | Admit: 2019-11-30 | Discharge: 2019-11-30 | Disposition: A | Discharge status: Home / Self Care | Payer: PPO | Source: Ambulatory Visit | Attending: Orthopaedic Surgery | Admitting: Orthopaedic Surgery

## 2019-11-30 ENCOUNTER — Telehealth: Payer: Self-pay

## 2019-11-30 ENCOUNTER — Other Ambulatory Visit: Payer: Self-pay

## 2019-11-30 ENCOUNTER — Other Ambulatory Visit: Payer: Self-pay | Admitting: Physician Assistant

## 2019-11-30 ENCOUNTER — Other Ambulatory Visit (HOSPITAL_COMMUNITY)
Admission: RE | Admit: 2019-11-30 | Discharge: 2019-11-30 | Disposition: A | Discharge status: Home / Self Care | Payer: PPO | Source: Ambulatory Visit | Attending: Orthopaedic Surgery | Admitting: Orthopaedic Surgery

## 2019-11-30 ENCOUNTER — Ambulatory Visit (HOSPITAL_COMMUNITY)
Admission: RE | Admit: 2019-11-30 | Discharge: 2019-11-30 | Disposition: A | Discharge status: Home / Self Care | Payer: PPO | Source: Ambulatory Visit | Attending: Physician Assistant | Admitting: Physician Assistant

## 2019-11-30 ENCOUNTER — Encounter (HOSPITAL_COMMUNITY): Payer: Self-pay

## 2019-11-30 DIAGNOSIS — Z20822 Contact with and (suspected) exposure to covid-19: Secondary | ICD-10-CM | POA: Insufficient documentation

## 2019-11-30 DIAGNOSIS — Z01818 Encounter for other preprocedural examination: Secondary | ICD-10-CM | POA: Insufficient documentation

## 2019-11-30 DIAGNOSIS — M1711 Unilateral primary osteoarthritis, right knee: Secondary | ICD-10-CM

## 2019-11-30 HISTORY — DX: Unspecified dementia without behavioral disturbance: F03.90

## 2019-11-30 LAB — URINALYSIS, ROUTINE W REFLEX MICROSCOPIC
Bilirubin Urine: NEGATIVE
Glucose, UA: NEGATIVE mg/dL
Hgb urine dipstick: NEGATIVE
Ketones, ur: NEGATIVE mg/dL
Leukocytes,Ua: NEGATIVE
Nitrite: NEGATIVE
Protein, ur: NEGATIVE mg/dL
Specific Gravity, Urine: 1.013 (ref 1.005–1.030)
pH: 7 (ref 5.0–8.0)

## 2019-11-30 LAB — CBC WITH DIFFERENTIAL/PLATELET
Abs Immature Granulocytes: 0.01 10*3/uL (ref 0.00–0.07)
Basophils Absolute: 0 10*3/uL (ref 0.0–0.1)
Basophils Relative: 0 %
Eosinophils Absolute: 0.1 10*3/uL (ref 0.0–0.5)
Eosinophils Relative: 2 %
HCT: 39.6 % (ref 39.0–52.0)
Hemoglobin: 12.6 g/dL — ABNORMAL LOW (ref 13.0–17.0)
Immature Granulocytes: 0 %
Lymphocytes Relative: 18 %
Lymphs Abs: 0.8 10*3/uL (ref 0.7–4.0)
MCH: 30 pg (ref 26.0–34.0)
MCHC: 31.8 g/dL (ref 30.0–36.0)
MCV: 94.3 fL (ref 80.0–100.0)
Monocytes Absolute: 0.6 10*3/uL (ref 0.1–1.0)
Monocytes Relative: 12 %
Neutro Abs: 3.3 10*3/uL (ref 1.7–7.7)
Neutrophils Relative %: 68 %
Platelets: 145 10*3/uL — ABNORMAL LOW (ref 150–400)
RBC: 4.2 MIL/uL — ABNORMAL LOW (ref 4.22–5.81)
RDW: 15 % (ref 11.5–15.5)
WBC: 4.8 10*3/uL (ref 4.0–10.5)
nRBC: 0 % (ref 0.0–0.2)

## 2019-11-30 LAB — COMPREHENSIVE METABOLIC PANEL
ALT: 18 U/L (ref 0–44)
AST: 20 U/L (ref 15–41)
Albumin: 3.6 g/dL (ref 3.5–5.0)
Alkaline Phosphatase: 43 U/L (ref 38–126)
Anion gap: 11 (ref 5–15)
BUN: 15 mg/dL (ref 8–23)
CO2: 31 mmol/L (ref 22–32)
Calcium: 9.1 mg/dL (ref 8.9–10.3)
Chloride: 99 mmol/L (ref 98–111)
Creatinine, Ser: 1.16 mg/dL (ref 0.61–1.24)
GFR calc Af Amer: >60 mL/min (ref >60)
GFR calc non Af Amer: >60 mL/min (ref >60)
Glucose, Bld: 90 mg/dL (ref 70–99)
Potassium: 3.6 mmol/L (ref 3.5–5.1)
Sodium: 141 mmol/L (ref 135–145)
Total Bilirubin: 0.7 mg/dL (ref 0.3–1.2)
Total Protein: 6.4 g/dL — ABNORMAL LOW (ref 6.5–8.1)

## 2019-11-30 LAB — TYPE AND SCREEN
ABO/RH(D): B POS
Antibody Screen: NEGATIVE

## 2019-11-30 LAB — SARS CORONAVIRUS 2 (TAT 6-24 HRS): SARS Coronavirus 2: NEGATIVE

## 2019-11-30 LAB — SURGICAL PCR SCREEN
MRSA, PCR: NEGATIVE
Staphylococcus aureus: NEGATIVE

## 2019-11-30 MED ORDER — OXYCODONE HCL 5 MG PO TABS: ORAL_TABLET | ORAL | 0 refills | Status: DC

## 2019-11-30 MED ORDER — ONDANSETRON HCL 4 MG PO TABS: 4.0000 mg | ORAL_TABLET | Freq: Three times a day (TID) | ORAL | 0 refills | Status: DC | PRN

## 2019-11-30 MED ORDER — DOCUSATE SODIUM 100 MG PO CAPS: 100.0000 mg | ORAL_CAPSULE | Freq: Every day | ORAL | 2 refills | Status: DC | PRN

## 2019-11-30 MED ORDER — OXYCODONE HCL ER 10 MG PO T12A: 10.0000 mg | EXTENDED_RELEASE_TABLET | Freq: Two times a day (BID) | ORAL | 0 refills | Status: DC

## 2019-11-30 MED ORDER — METHOCARBAMOL 500 MG PO TABS: 500.0000 mg | ORAL_TABLET | Freq: Two times a day (BID) | ORAL | 0 refills | Status: DC | PRN

## 2019-11-30 NOTE — Telephone Encounter (Signed)
Patient is confused on what meds he should and shouldn't be taking.  Would like a CB to discuss.    CB [phone]

## 2019-11-30 NOTE — Telephone Encounter (Signed)
Spoke to wife

## 2019-11-30 NOTE — Telephone Encounter (Signed)
Spoke with wife yesterday afternoon.

## 2019-11-30 NOTE — Progress Notes (Signed)
PCP - Shon Baton Cardiologist - Domingo Dimes  Chest x-ray - 11/30/19 EKG - 11/30/19 Stress Test - 06/26/15 ECHO - 03/2015 Cardiac Cath - denies   Blood Thinner Instructions: LD Eliquis on 11/30/19 Aspirin Instructions: n/a  ERAS Protcol - yes clears until 0650 PRE-SURGERY Ensure or G2- ensure given  COVID TEST- 11/30/19   Anesthesia review: yes, hx CAD  Patient denies shortness of breath, fever, cough and chest pain at PAT appointment   All instructions explained to the patient, with a verbal understanding of the material. Patient agrees to go over the instructions while at home for a better understanding. Patient also instructed to self quarantine after being tested for COVID-19. The opportunity to ask questions was provided.

## 2019-12-01 MED ORDER — TRANEXAMIC ACID 1000 MG/10ML IV SOLN
2000.0000 mg | INTRAVENOUS | Status: DC
  Filled 2019-12-01 (×2): qty 20

## 2019-12-01 MED ORDER — BUPIVACAINE LIPOSOME 1.3 % IJ SUSP
20.0000 mL | Freq: Once | INTRAMUSCULAR | Status: DC
  Filled 2019-12-01: qty 20

## 2019-12-01 NOTE — Anesthesia Preprocedure Evaluation (Addendum)
Anesthesia Evaluation  Patient identified by MRN, date of birth, ID band Patient awake    Reviewed: Allergy & Precautions, H&P , NPO status , Patient's Chart, lab work & pertinent test results, reviewed documented beta blocker date and time   Airway Mallampati: II  TM Distance: >3 FB Neck ROM: full    Dental no notable dental hx.    Pulmonary shortness of breath, former smoker,    Pulmonary exam normal breath sounds clear to auscultation       Cardiovascular Exercise Tolerance: Good hypertension, Pt. on medications + CAD  + dysrhythmias Atrial Fibrillation  Rhythm:regular Rate:Normal     Neuro/Psych  Headaches, PSYCHIATRIC DISORDERS Dementia    GI/Hepatic Neg liver ROS, hiatal hernia, GERD  ,  Endo/Other  negative endocrine ROS  Renal/GU Renal disease  negative genitourinary   Musculoskeletal  (+) Arthritis , Osteoarthritis,    Abdominal   Peds  Hematology negative hematology ROS (+) anemia ,   Anesthesia Other Findings   Reproductive/Obstetrics negative OB ROS                           Anesthesia Physical Anesthesia Plan  ASA: III  Anesthesia Plan: Spinal and MAC   Post-op Pain Management:    Induction:   PONV Risk Score and Plan: 1  Airway Management Planned: Simple Face Mask, Nasal Cannula and Mask  Additional Equipment:   Intra-op Plan:   Post-operative Plan:   Informed Consent: I have reviewed the patients History and Physical, chart, labs and discussed the procedure including the risks, benefits and alternatives for the proposed anesthesia with the patient or authorized representative who has indicated his/her understanding and acceptance.     Dental Advisory Given  Plan Discussed with: CRNA and Anesthesiologist  Anesthesia Plan Comments: (Follows with Cardiology at [facility] for hx of hypertension, hyperlipidemia, paroxysmal atrial fibrillation/flutter on Eliquis,  combined HF and coronary artery disease status post PCI of the LAD with a drug-eluting stent in 2010. Stress test 2016 showed no ischemia. Echo 2016 showed EF 40%. Last seen by Dr. Ubaldo Glassing  10/04/19, doing well at that time, no angina. Copy of cardiac clearance on pt chart states pt can hold Eliquis 3d preop. LD reported 11/30/19.  Pt has mild dementia and wife helps answer questions regarding medical history.  Preop labs reviewed, unremarkable.  Pt evidently left PAT before EKG was done. Last in Care everywhere >70yrold. Will need EKG DOS.  EKG 10/11/18 (care everywhere): Atrial flutter with variable AV block. Rate 66. Left axis deviation. Pulmonary disease pattern  CHEST - 2 VIEW 11/30/19:  COMPARISON:  07/25/2015  FINDINGS: Normal cardiac silhouette with ectatic aorta. No effusion, infiltrate or pneumothorax. No acute osseous abnormality.  IMPRESSION: No acute cardiopulmonary process.  TTE 06/26/15 (care everywhere): INTERPRETATION MILD LV SYSTOLIC DYSFUNCTION WITH MODERATE LVH MODERATE VALVULAR REGURGITATION NO VALVULAR STENOSIS MILD PHTN LVH: MILD LVH Aortic: MILD AR Tricuspid: MILD TR Closest EF: 40% (Estimated) Mitral: MILD MR  Nuclear stress 06/26/15 (care everywhere): Result impression: Equivocal ETT based on baseline electrocardiographic changes.Sestamibi  images revealed moderate reduced LV function EF 37% with no reversible  Ischemia)       Anesthesia Quick Evaluation

## 2019-12-01 NOTE — Progress Notes (Signed)
Anesthesia Chart Review:  Follows with Cardiology at [facility] for hx of hypertension, hyperlipidemia, paroxysmal atrial fibrillation/flutter on Eliquis, combined HF and coronary artery disease status post PCI of the LAD with a drug-eluting stent in 2010. Stress test 2016 showed no ischemia. Echo 2016 showed EF 40%. Last seen by Dr. Ubaldo Glassing  10/04/19, doing well at that time, no angina. Copy of cardiac clearance on pt chart states pt can hold Eliquis 3d preop. LD reported 11/30/19.  Pt has mild dementia and wife helps answer questions regarding medical history.  Preop labs reviewed, unremarkable.  Pt evidently left PAT before EKG was done. Last in Care everywhere >35yr old. Will need EKG DOS.  EKG 10/11/18 (care everywhere): Atrial flutter with variable AV block. Rate 66. Left axis deviation. Pulmonary disease pattern  CHEST - 2 VIEW 11/30/19:  COMPARISON:  07/25/2015  FINDINGS: Normal cardiac silhouette with ectatic aorta. No effusion, infiltrate or pneumothorax. No acute osseous abnormality.  IMPRESSION: No acute cardiopulmonary process.  TTE 06/26/15 (care everywhere): INTERPRETATION MILD LV SYSTOLIC DYSFUNCTION WITH MODERATE LVH MODERATE VALVULAR REGURGITATION NO VALVULAR STENOSIS MILD PHTN LVH: MILD LVH Aortic: MILD AR Tricuspid: MILD TR Closest EF: 40% (Estimated) Mitral: MILD MR  Nuclear stress 06/26/15 (care everywhere): Result impression: Equivocal ETT based on baseline electrocardiographic changes.Sestamibi  images revealed moderate reduced LV function EF 37% with no reversible  Ischemia   Wynonia Musty [facility] Short Stay Center/Anesthesiology Phone [phone] 12/01/2019 11:09 AM

## 2019-12-04 ENCOUNTER — Encounter (HOSPITAL_COMMUNITY): Payer: Self-pay | Admitting: Orthopaedic Surgery

## 2019-12-04 ENCOUNTER — Ambulatory Visit (HOSPITAL_COMMUNITY): Payer: PPO | Admitting: Anesthesiology

## 2019-12-04 ENCOUNTER — Ambulatory Visit (HOSPITAL_COMMUNITY): Payer: PPO | Admitting: Physician Assistant

## 2019-12-04 ENCOUNTER — Other Ambulatory Visit: Payer: Self-pay

## 2019-12-04 ENCOUNTER — Observation Stay (HOSPITAL_COMMUNITY)
Admission: RE | Admit: 2019-12-04 | Discharge: 2019-12-05 | Disposition: A | Discharge status: Home / Self Care | Payer: PPO | Attending: Orthopaedic Surgery | Admitting: Orthopaedic Surgery

## 2019-12-04 ENCOUNTER — Observation Stay (HOSPITAL_COMMUNITY): Payer: PPO

## 2019-12-04 ENCOUNTER — Encounter (HOSPITAL_COMMUNITY)
Admission: RE | Disposition: A | Discharge status: Home / Self Care | Payer: Self-pay | Source: Home / Self Care | Attending: Orthopaedic Surgery

## 2019-12-04 DIAGNOSIS — F0390 Unspecified dementia without behavioral disturbance: Secondary | ICD-10-CM | POA: Insufficient documentation

## 2019-12-04 DIAGNOSIS — Z955 Presence of coronary angioplasty implant and graft: Secondary | ICD-10-CM | POA: Insufficient documentation

## 2019-12-04 DIAGNOSIS — Z8546 Personal history of malignant neoplasm of prostate: Secondary | ICD-10-CM | POA: Diagnosis not present

## 2019-12-04 DIAGNOSIS — G8918 Other acute postprocedural pain: Secondary | ICD-10-CM | POA: Diagnosis not present

## 2019-12-04 DIAGNOSIS — D509 Iron deficiency anemia, unspecified: Secondary | ICD-10-CM | POA: Diagnosis not present

## 2019-12-04 DIAGNOSIS — Z79899 Other long term (current) drug therapy: Secondary | ICD-10-CM | POA: Insufficient documentation

## 2019-12-04 DIAGNOSIS — Z87891 Personal history of nicotine dependence: Secondary | ICD-10-CM | POA: Insufficient documentation

## 2019-12-04 DIAGNOSIS — Z7901 Long term (current) use of anticoagulants: Secondary | ICD-10-CM | POA: Insufficient documentation

## 2019-12-04 DIAGNOSIS — Z96651 Presence of right artificial knee joint: Secondary | ICD-10-CM | POA: Diagnosis not present

## 2019-12-04 DIAGNOSIS — I4891 Unspecified atrial fibrillation: Secondary | ICD-10-CM | POA: Insufficient documentation

## 2019-12-04 DIAGNOSIS — I10 Essential (primary) hypertension: Secondary | ICD-10-CM | POA: Insufficient documentation

## 2019-12-04 DIAGNOSIS — I2510 Atherosclerotic heart disease of native coronary artery without angina pectoris: Secondary | ICD-10-CM | POA: Insufficient documentation

## 2019-12-04 DIAGNOSIS — M1711 Unilateral primary osteoarthritis, right knee: Secondary | ICD-10-CM | POA: Diagnosis not present

## 2019-12-04 DIAGNOSIS — Z96652 Presence of left artificial knee joint: Secondary | ICD-10-CM | POA: Diagnosis not present

## 2019-12-04 DIAGNOSIS — Z79891 Long term (current) use of opiate analgesic: Secondary | ICD-10-CM | POA: Insufficient documentation

## 2019-12-04 DIAGNOSIS — D649 Anemia, unspecified: Secondary | ICD-10-CM | POA: Diagnosis not present

## 2019-12-04 DIAGNOSIS — Z6836 Body mass index (BMI) 36.0-36.9, adult: Secondary | ICD-10-CM | POA: Diagnosis not present

## 2019-12-04 DIAGNOSIS — Z471 Aftercare following joint replacement surgery: Secondary | ICD-10-CM | POA: Diagnosis not present

## 2019-12-04 DIAGNOSIS — K219 Gastro-esophageal reflux disease without esophagitis: Secondary | ICD-10-CM | POA: Diagnosis not present

## 2019-12-04 HISTORY — PX: TOTAL KNEE ARTHROPLASTY: SHX125

## 2019-12-04 LAB — PROTIME-INR
INR: 1.1 (ref 0.8–1.2)
Prothrombin Time: 13.4 seconds (ref 11.4–15.2)

## 2019-12-04 LAB — APTT: aPTT: 28 seconds (ref 24–36)

## 2019-12-04 SURGERY — ARTHROPLASTY, KNEE, TOTAL
Anesthesia: Monitor Anesthesia Care | Site: Knee | Laterality: Right

## 2019-12-04 MED ORDER — ACETAMINOPHEN 500 MG PO TABS
1000.0000 mg | ORAL_TABLET | Freq: Three times a day (TID) | ORAL | Status: DC
  Administered 2019-12-04 – 2019-12-05 (×3): 1000 mg via ORAL
  Filled 2019-12-04 (×3): qty 2

## 2019-12-04 MED ORDER — METHOCARBAMOL 500 MG PO TABS: 500.0000 mg | ORAL_TABLET | Freq: Four times a day (QID) | ORAL | Status: DC | PRN

## 2019-12-04 MED ORDER — EPINEPHRINE PF 1 MG/ML IJ SOLN
INTRAMUSCULAR | Status: AC
  Filled 2019-12-04: qty 1

## 2019-12-04 MED ORDER — CEFAZOLIN SODIUM-DEXTROSE 2-4 GM/100ML-% IV SOLN
2.0000 g | INTRAVENOUS | Status: AC
  Administered 2019-12-04: 2 g via INTRAVENOUS

## 2019-12-04 MED ORDER — FENTANYL CITRATE (PF) 100 MCG/2ML IJ SOLN
100.0000 ug | Freq: Once | INTRAMUSCULAR | Status: AC
  Administered 2019-12-04: 100 ug via INTRAVENOUS

## 2019-12-04 MED ORDER — APIXABAN 5 MG PO TABS
5.0000 mg | ORAL_TABLET | Freq: Two times a day (BID) | ORAL | Status: DC
  Administered 2019-12-04 – 2019-12-05 (×2): 5 mg via ORAL
  Filled 2019-12-04 (×2): qty 1

## 2019-12-04 MED ORDER — MAGNESIUM CITRATE PO SOLN: 1.0000 | Freq: Once | ORAL | Status: DC | PRN

## 2019-12-04 MED ORDER — POLYETHYLENE GLYCOL 3350 17 G PO PACK: 17.0000 g | PACK | Freq: Every day | ORAL | Status: DC | PRN

## 2019-12-04 MED ORDER — ONDANSETRON HCL 4 MG/2ML IJ SOLN
INTRAMUSCULAR | Status: AC
  Filled 2019-12-04: qty 2

## 2019-12-04 MED ORDER — HYDRALAZINE HCL 25 MG PO TABS
25.0000 mg | ORAL_TABLET | Freq: Three times a day (TID) | ORAL | Status: DC
  Administered 2019-12-04 – 2019-12-05 (×3): 25 mg via ORAL
  Filled 2019-12-04 (×3): qty 1

## 2019-12-04 MED ORDER — MIDAZOLAM HCL 2 MG/2ML IJ SOLN
INTRAMUSCULAR | Status: AC
  Filled 2019-12-04: qty 2

## 2019-12-04 MED ORDER — DOCUSATE SODIUM 100 MG PO CAPS
100.0000 mg | ORAL_CAPSULE | Freq: Two times a day (BID) | ORAL | Status: DC
  Administered 2019-12-04 – 2019-12-05 (×2): 100 mg via ORAL
  Filled 2019-12-04 (×2): qty 1

## 2019-12-04 MED ORDER — CEFAZOLIN SODIUM-DEXTROSE 2-4 GM/100ML-% IV SOLN
2.0000 g | Freq: Four times a day (QID) | INTRAVENOUS | Status: AC
  Administered 2019-12-04 – 2019-12-05 (×3): 2 g via INTRAVENOUS
  Filled 2019-12-04 (×3): qty 100

## 2019-12-04 MED ORDER — OXYCODONE HCL ER 10 MG PO T12A
10.0000 mg | EXTENDED_RELEASE_TABLET | Freq: Two times a day (BID) | ORAL | Status: DC
  Administered 2019-12-05: 10 mg via ORAL
  Filled 2019-12-04 (×2): qty 1

## 2019-12-04 MED ORDER — ACETAMINOPHEN 160 MG/5ML PO SOLN: 325.0000 mg | ORAL | Status: DC | PRN

## 2019-12-04 MED ORDER — MENTHOL 3 MG MT LOZG: 1.0000 | LOZENGE | OROMUCOSAL | Status: DC | PRN

## 2019-12-04 MED ORDER — POVIDONE-IODINE 10 % EX SWAB: 2.0000 "application " | Freq: Once | CUTANEOUS | Status: DC

## 2019-12-04 MED ORDER — METOCLOPRAMIDE HCL 5 MG/ML IJ SOLN: 5.0000 mg | Freq: Three times a day (TID) | INTRAMUSCULAR | Status: DC | PRN

## 2019-12-04 MED ORDER — METOPROLOL TARTRATE 50 MG PO TABS
100.0000 mg | ORAL_TABLET | Freq: Two times a day (BID) | ORAL | Status: DC
  Administered 2019-12-04 – 2019-12-05 (×2): 100 mg via ORAL
  Filled 2019-12-04 (×2): qty 2

## 2019-12-04 MED ORDER — DEXAMETHASONE SODIUM PHOSPHATE 10 MG/ML IJ SOLN
10.0000 mg | Freq: Once | INTRAMUSCULAR | Status: AC
  Administered 2019-12-05: 10 mg via INTRAVENOUS
  Filled 2019-12-04: qty 1

## 2019-12-04 MED ORDER — PHENYLEPHRINE HCL-NACL 10-0.9 MG/250ML-% IV SOLN
INTRAVENOUS | Status: DC | PRN
Start: 2019-12-04 — End: 2019-12-04
  Administered 2019-12-04: 50 ug/min via INTRAVENOUS

## 2019-12-04 MED ORDER — BUPIVACAINE HCL 0.25 % IJ SOLN
INTRAMUSCULAR | Status: DC | PRN
  Administered 2019-12-04: 20 mL

## 2019-12-04 MED ORDER — ACETAMINOPHEN 325 MG PO TABS: 325.0000 mg | ORAL_TABLET | ORAL | Status: DC | PRN

## 2019-12-04 MED ORDER — FENTANYL CITRATE (PF) 100 MCG/2ML IJ SOLN: 25.0000 ug | INTRAMUSCULAR | Status: DC | PRN

## 2019-12-04 MED ORDER — METHOCARBAMOL 1000 MG/10ML IJ SOLN
500.0000 mg | Freq: Four times a day (QID) | INTRAVENOUS | Status: DC | PRN
  Filled 2019-12-04: qty 5

## 2019-12-04 MED ORDER — CEFAZOLIN SODIUM-DEXTROSE 2-4 GM/100ML-% IV SOLN
INTRAVENOUS | Status: AC
  Filled 2019-12-04: qty 100

## 2019-12-04 MED ORDER — VANCOMYCIN HCL 1000 MG IV SOLR
INTRAVENOUS | Status: AC
  Filled 2019-12-04: qty 1000

## 2019-12-04 MED ORDER — ONDANSETRON HCL 4 MG/2ML IJ SOLN: 4.0000 mg | Freq: Four times a day (QID) | INTRAMUSCULAR | Status: DC | PRN

## 2019-12-04 MED ORDER — MEPERIDINE HCL 25 MG/ML IJ SOLN: 6.2500 mg | INTRAMUSCULAR | Status: DC | PRN

## 2019-12-04 MED ORDER — TRANEXAMIC ACID 1000 MG/10ML IV SOLN
INTRAVENOUS | Status: DC | PRN
  Administered 2019-12-04: 2000 mg via TOPICAL

## 2019-12-04 MED ORDER — MEMANTINE HCL 10 MG PO TABS
10.0000 mg | ORAL_TABLET | Freq: Two times a day (BID) | ORAL | Status: DC
  Administered 2019-12-04 – 2019-12-05 (×2): 10 mg via ORAL
  Filled 2019-12-04 (×2): qty 1

## 2019-12-04 MED ORDER — CELECOXIB 200 MG PO CAPS: 200.0000 mg | ORAL_CAPSULE | Freq: Two times a day (BID) | ORAL | Status: DC

## 2019-12-04 MED ORDER — LIDOCAINE 2% (20 MG/ML) 5 ML SYRINGE
INTRAMUSCULAR | Status: AC
  Filled 2019-12-04: qty 5

## 2019-12-04 MED ORDER — ACETAMINOPHEN 325 MG PO TABS: 325.0000 mg | ORAL_TABLET | Freq: Four times a day (QID) | ORAL | Status: DC | PRN

## 2019-12-04 MED ORDER — IRRISEPT - 450ML BOTTLE WITH 0.05% CHG IN STERILE WATER, USP 99.95% OPTIME
TOPICAL | Status: DC | PRN
  Administered 2019-12-04: 450 mL via TOPICAL

## 2019-12-04 MED ORDER — PROPOFOL 10 MG/ML IV BOLUS
INTRAVENOUS | Status: AC
  Filled 2019-12-04: qty 20

## 2019-12-04 MED ORDER — VANCOMYCIN HCL 1000 MG IV SOLR
INTRAVENOUS | Status: DC | PRN
  Administered 2019-12-04: 1000 mg via TOPICAL

## 2019-12-04 MED ORDER — TRANEXAMIC ACID-NACL 1000-0.7 MG/100ML-% IV SOLN
1000.0000 mg | Freq: Once | INTRAVENOUS | Status: AC
  Administered 2019-12-04: 1000 mg via INTRAVENOUS
  Filled 2019-12-04: qty 100

## 2019-12-04 MED ORDER — EPINEPHRINE PF 1 MG/ML IJ SOLN
INTRAMUSCULAR | Status: DC | PRN
  Administered 2019-12-04: .15 mL

## 2019-12-04 MED ORDER — ONDANSETRON HCL 4 MG/2ML IJ SOLN: 4.0000 mg | Freq: Once | INTRAMUSCULAR | Status: DC | PRN

## 2019-12-04 MED ORDER — SODIUM CHLORIDE 0.9% FLUSH
INTRAVENOUS | Status: DC | PRN
  Administered 2019-12-04 (×2): 10 mL

## 2019-12-04 MED ORDER — PROPOFOL 500 MG/50ML IV EMUL
INTRAVENOUS | Status: DC | PRN
  Administered 2019-12-04: 25 ug/kg/min via INTRAVENOUS

## 2019-12-04 MED ORDER — GABAPENTIN 300 MG PO CAPS
300.0000 mg | ORAL_CAPSULE | Freq: Three times a day (TID) | ORAL | Status: DC
  Administered 2019-12-04 – 2019-12-05 (×3): 300 mg via ORAL
  Filled 2019-12-04 (×3): qty 1

## 2019-12-04 MED ORDER — FENTANYL CITRATE (PF) 100 MCG/2ML IJ SOLN
INTRAMUSCULAR | Status: AC
  Filled 2019-12-04: qty 2

## 2019-12-04 MED ORDER — HYDROMORPHONE HCL 1 MG/ML IJ SOLN: 0.5000 mg | INTRAMUSCULAR | Status: DC | PRN

## 2019-12-04 MED ORDER — DEXAMETHASONE SODIUM PHOSPHATE 10 MG/ML IJ SOLN
INTRAMUSCULAR | Status: DC | PRN
  Administered 2019-12-04: 10 mg

## 2019-12-04 MED ORDER — SODIUM CHLORIDE 0.9 % IV SOLN: INTRAVENOUS | Status: DC

## 2019-12-04 MED ORDER — TRANEXAMIC ACID-NACL 1000-0.7 MG/100ML-% IV SOLN
1000.0000 mg | INTRAVENOUS | Status: AC
  Administered 2019-12-04: 1000 mg via INTRAVENOUS

## 2019-12-04 MED ORDER — ONDANSETRON HCL 4 MG PO TABS: 4.0000 mg | ORAL_TABLET | Freq: Four times a day (QID) | ORAL | Status: DC | PRN

## 2019-12-04 MED ORDER — SODIUM CHLORIDE 0.9 % IR SOLN
Status: DC | PRN
  Administered 2019-12-04: 3000 mL

## 2019-12-04 MED ORDER — OXYCODONE HCL 5 MG PO TABS: 5.0000 mg | ORAL_TABLET | Freq: Once | ORAL | Status: DC | PRN

## 2019-12-04 MED ORDER — ALUM & MAG HYDROXIDE-SIMETH 200-200-20 MG/5ML PO SUSP: 30.0000 mL | ORAL | Status: DC | PRN

## 2019-12-04 MED ORDER — LACTATED RINGERS IV SOLN: INTRAVENOUS | Status: DC

## 2019-12-04 MED ORDER — BUPIVACAINE LIPOSOME 1.3 % IJ SUSP
INTRAMUSCULAR | Status: DC | PRN
  Administered 2019-12-04: 20 mL

## 2019-12-04 MED ORDER — TAMSULOSIN HCL 0.4 MG PO CAPS
0.4000 mg | ORAL_CAPSULE | Freq: Every day | ORAL | Status: DC
  Administered 2019-12-05: 0.4 mg via ORAL
  Filled 2019-12-04: qty 1

## 2019-12-04 MED ORDER — TRANEXAMIC ACID-NACL 1000-0.7 MG/100ML-% IV SOLN
INTRAVENOUS | Status: AC
  Filled 2019-12-04: qty 100

## 2019-12-04 MED ORDER — ONDANSETRON HCL 4 MG/2ML IJ SOLN
INTRAMUSCULAR | Status: DC | PRN
  Administered 2019-12-04: 4 mg via INTRAVENOUS

## 2019-12-04 MED ORDER — PROPOFOL 10 MG/ML IV BOLUS
INTRAVENOUS | Status: DC | PRN
  Administered 2019-12-04 (×4): 20 mg via INTRAVENOUS

## 2019-12-04 MED ORDER — BUPIVACAINE IN DEXTROSE 0.75-8.25 % IT SOLN
INTRATHECAL | Status: DC | PRN
  Administered 2019-12-04: 2 mL via INTRATHECAL

## 2019-12-04 MED ORDER — KETOROLAC TROMETHAMINE 15 MG/ML IJ SOLN: 30.0000 mg | Freq: Four times a day (QID) | INTRAMUSCULAR | Status: DC | PRN

## 2019-12-04 MED ORDER — EPHEDRINE SULFATE-NACL 50-0.9 MG/10ML-% IV SOSY
PREFILLED_SYRINGE | INTRAVENOUS | Status: DC | PRN
  Administered 2019-12-04 (×2): 5 mg via INTRAVENOUS

## 2019-12-04 MED ORDER — OXYCODONE HCL 5 MG PO TABS: 10.0000 mg | ORAL_TABLET | ORAL | Status: DC | PRN

## 2019-12-04 MED ORDER — OXYCODONE HCL 5 MG/5ML PO SOLN: 5.0000 mg | Freq: Once | ORAL | Status: DC | PRN

## 2019-12-04 MED ORDER — MIRABEGRON ER 50 MG PO TB24
50.0000 mg | ORAL_TABLET | Freq: Every day | ORAL | Status: DC
  Administered 2019-12-05: 50 mg via ORAL
  Filled 2019-12-04: qty 1

## 2019-12-04 MED ORDER — PHENOL 1.4 % MT LIQD: 1.0000 | OROMUCOSAL | Status: DC | PRN

## 2019-12-04 MED ORDER — KETOROLAC TROMETHAMINE 15 MG/ML IJ SOLN
30.0000 mg | Freq: Four times a day (QID) | INTRAMUSCULAR | Status: DC
  Filled 2019-12-04: qty 2

## 2019-12-04 MED ORDER — DIPHENHYDRAMINE HCL 12.5 MG/5ML PO ELIX: 25.0000 mg | ORAL_SOLUTION | ORAL | Status: DC | PRN

## 2019-12-04 MED ORDER — HYDROCHLOROTHIAZIDE 25 MG PO TABS
25.0000 mg | ORAL_TABLET | Freq: Every day | ORAL | Status: DC
  Administered 2019-12-04 – 2019-12-05 (×2): 25 mg via ORAL
  Filled 2019-12-04 (×2): qty 1

## 2019-12-04 MED ORDER — BUPIVACAINE HCL (PF) 0.25 % IJ SOLN
INTRAMUSCULAR | Status: AC
  Filled 2019-12-04: qty 30

## 2019-12-04 MED ORDER — OXYCODONE HCL 5 MG PO TABS
5.0000 mg | ORAL_TABLET | ORAL | Status: DC | PRN
  Administered 2019-12-04: 5 mg via ORAL
  Filled 2019-12-04: qty 1

## 2019-12-04 MED ORDER — SORBITOL 70 % SOLN: 30.0000 mL | Freq: Every day | Status: DC | PRN

## 2019-12-04 MED ORDER — LIDOCAINE HCL (CARDIAC) PF 100 MG/5ML IV SOSY
PREFILLED_SYRINGE | INTRAVENOUS | Status: DC | PRN
  Administered 2019-12-04: 40 mg via INTRAVENOUS

## 2019-12-04 MED ORDER — MECLIZINE HCL 12.5 MG PO TABS
12.5000 mg | ORAL_TABLET | Freq: Every day | ORAL | Status: DC
  Administered 2019-12-05: 12.5 mg via ORAL
  Filled 2019-12-04: qty 1

## 2019-12-04 MED ORDER — ROPIVACAINE HCL 7.5 MG/ML IJ SOLN
INTRAMUSCULAR | Status: DC | PRN
  Administered 2019-12-04: 25 mL via PERINEURAL

## 2019-12-04 MED ORDER — 0.9 % SODIUM CHLORIDE (POUR BTL) OPTIME
TOPICAL | Status: DC | PRN
  Administered 2019-12-04: 1000 mL

## 2019-12-04 MED ORDER — CELECOXIB 200 MG PO CAPS
200.0000 mg | ORAL_CAPSULE | Freq: Two times a day (BID) | ORAL | Status: DC
  Filled 2019-12-04: qty 1

## 2019-12-04 MED ORDER — METOCLOPRAMIDE HCL 5 MG PO TABS: 5.0000 mg | ORAL_TABLET | Freq: Three times a day (TID) | ORAL | Status: DC | PRN

## 2019-12-04 SURGICAL SUPPLY — 77 items
ALCOHOL 70% 16 OZ (MISCELLANEOUS) ×3 IMPLANT
BAG DECANTER FOR FLEXI CONT (MISCELLANEOUS) ×3 IMPLANT
BANDAGE ESMARK 6X9 LF (GAUZE/BANDAGES/DRESSINGS) IMPLANT
BASEPLATE TIBIAL RT SZ 5 (Knees) IMPLANT
BLADE SAG 18X100X1.27 (BLADE) ×3 IMPLANT
BNDG ESMARK 6X9 LF (GAUZE/BANDAGES/DRESSINGS)
BOWL SMART MIX CTS (DISPOSABLE) ×3 IMPLANT
CEMENT BONE REFOBACIN R1X40 US (Cement) ×2 IMPLANT
CLOSURE STERI-STRIP 1/2X4 (GAUZE/BANDAGES/DRESSINGS) ×1
CLSR STERI-STRIP ANTIMIC 1/2X4 (GAUZE/BANDAGES/DRESSINGS) ×3 IMPLANT
COVER SURGICAL LIGHT HANDLE (MISCELLANEOUS) ×3 IMPLANT
COVER WAND RF STERILE (DRAPES) IMPLANT
CUFF TOURN SGL QUICK 34 (TOURNIQUET CUFF) ×3
CUFF TOURN SGL QUICK 42 (TOURNIQUET CUFF) IMPLANT
CUFF TRNQT CYL 34X4.125X (TOURNIQUET CUFF) ×1 IMPLANT
DERMABOND ADVANCED (GAUZE/BANDAGES/DRESSINGS) ×2
DERMABOND ADVANCED .7 DNX12 (GAUZE/BANDAGES/DRESSINGS) ×1 IMPLANT
DRAPE EXTREMITY T 121X128X90 (DISPOSABLE) ×3 IMPLANT
DRAPE HALF SHEET 40X57 (DRAPES) ×3 IMPLANT
DRAPE INCISE IOBAN 66X45 STRL (DRAPES) IMPLANT
DRAPE ORTHO SPLIT 77X108 STRL (DRAPES) ×6
DRAPE POUCH INSTRU U-SHP 10X18 (DRAPES) ×3 IMPLANT
DRAPE SURG ORHT 6 SPLT 77X108 (DRAPES) ×2 IMPLANT
DRAPE U-SHAPE 47X51 STRL (DRAPES) ×6 IMPLANT
DRSG AQUACEL AG ADV 3.5X10 (GAUZE/BANDAGES/DRESSINGS) ×3 IMPLANT
DURAPREP 26ML APPLICATOR (WOUND CARE) ×9 IMPLANT
ELECT CAUTERY BLADE 6.4 (BLADE) ×3 IMPLANT
ELECT REM PT RETURN 9FT ADLT (ELECTROSURGICAL) ×3
ELECTRODE REM PT RTRN 9FT ADLT (ELECTROSURGICAL) ×1 IMPLANT
FEMUR OXINIUM SZ 5 RT (Knees) ×2 IMPLANT
GLOVE BIOGEL PI IND STRL 7.0 (GLOVE) ×1 IMPLANT
GLOVE BIOGEL PI INDICATOR 7.0 (GLOVE) ×2
GLOVE ECLIPSE 7.0 STRL STRAW (GLOVE) ×9 IMPLANT
GLOVE SKINSENSE NS SZ7.5 (GLOVE) ×6
GLOVE SKINSENSE STRL SZ7.5 (GLOVE) ×3 IMPLANT
GLOVE SURG SYN 7.5  E (GLOVE) ×12
GLOVE SURG SYN 7.5 E (GLOVE) ×4 IMPLANT
GLOVE SURG SYN 7.5 PF PI (GLOVE) ×4 IMPLANT
GOWN STRL REIN XL XLG (GOWN DISPOSABLE) ×3 IMPLANT
GOWN STRL REUS W/ TWL LRG LVL3 (GOWN DISPOSABLE) ×1 IMPLANT
GOWN STRL REUS W/TWL LRG LVL3 (GOWN DISPOSABLE) ×3
HANDPIECE INTERPULSE COAX TIP (DISPOSABLE) ×3
HOOD PEEL AWAY FLYTE STAYCOOL (MISCELLANEOUS) ×6 IMPLANT
INSERT SZ 5-6 KNEE (Knees) ×2 IMPLANT
JET LAVAGE IRRISEPT WOUND (IRRIGATION / IRRIGATOR) ×3
KIT BASIN OR (CUSTOM PROCEDURE TRAY) ×3 IMPLANT
KIT TURNOVER KIT B (KITS) ×3 IMPLANT
LAVAGE JET IRRISEPT WOUND (IRRIGATION / IRRIGATOR) ×1 IMPLANT
MANIFOLD NEPTUNE II (INSTRUMENTS) ×3 IMPLANT
MARKER SKIN DUAL TIP RULER LAB (MISCELLANEOUS) ×3 IMPLANT
NDL SPNL 18GX3.5 QUINCKE PK (NEEDLE) ×2 IMPLANT
NEEDLE SPNL 18GX3.5 QUINCKE PK (NEEDLE) ×6 IMPLANT
NS IRRIG 1000ML POUR BTL (IV SOLUTION) ×3 IMPLANT
PACK TOTAL JOINT (CUSTOM PROCEDURE TRAY) ×3 IMPLANT
PAD ARMBOARD 7.5X6 YLW CONV (MISCELLANEOUS) ×6 IMPLANT
PAT COMP GENESIS 7.5 THICK 35 (Knees) ×3 IMPLANT
PATELLA COMP GENES 7.5 THCK 35 (Knees) IMPLANT
PIN TROCAR 3 INCH (PIN) ×16 IMPLANT
SAW OSC TIP CART 19.5X105X1.3 (SAW) ×3 IMPLANT
SET HNDPC FAN SPRY TIP SCT (DISPOSABLE) ×1 IMPLANT
STAPLER VISISTAT 35W (STAPLE) IMPLANT
SUCTION FRAZIER HANDLE 10FR (MISCELLANEOUS) ×3
SUCTION TUBE FRAZIER 10FR DISP (MISCELLANEOUS) ×1 IMPLANT
SUT ETHILON 2 0 FS 18 (SUTURE) IMPLANT
SUT MNCRL AB 4-0 PS2 18 (SUTURE) IMPLANT
SUT VIC AB 0 CT1 27 (SUTURE) ×6
SUT VIC AB 0 CT1 27XBRD ANBCTR (SUTURE) ×2 IMPLANT
SUT VIC AB 1 CTX 27 (SUTURE) ×7 IMPLANT
SUT VIC AB 2-0 CT1 27 (SUTURE) ×9
SUT VIC AB 2-0 CT1 TAPERPNT 27 (SUTURE) ×4 IMPLANT
SYR 50ML LL SCALE MARK (SYRINGE) ×6 IMPLANT
TIBIAL BASEPLATE SZ 5 (Knees) ×3 IMPLANT
TOWEL GREEN STERILE (TOWEL DISPOSABLE) ×3 IMPLANT
TOWEL GREEN STERILE FF (TOWEL DISPOSABLE) ×3 IMPLANT
TRAY CATH 16FR W/PLASTIC CATH (SET/KITS/TRAYS/PACK) IMPLANT
UNDERPAD 30X30 (UNDERPADS AND DIAPERS) ×3 IMPLANT
WRAP KNEE MAXI GEL POST OP (GAUZE/BANDAGES/DRESSINGS) ×3 IMPLANT

## 2019-12-04 NOTE — H&P (Signed)
PREOPERATIVE H&P  Chief Complaint: right knee osteoarthritis  HPI: Jim Dodson is a 79 y.o. male who presents for surgical treatment of right knee osteoarthritis.  He denies any changes in medical history.  Past Medical History:  Diagnosis Date  . A-fib   . Actinic keratosis   . Arthritis   . Atrial fibrillation ([place])   . Coronary artery disease    stent placed 2009  . Dementia ([place])    memory loss  . Headache   . History of hiatal hernia   . History of kidney stones    patient denies  . Hypertension   . Prostate cancer [facility])    prostate cancer, had sx  . Seborrheic dermatitis   . Shortness of breath dyspnea    Past Surgical History:  Procedure Laterality Date  . CARDIAC CATHETERIZATION     2009, stent placed  . CARDIOVERSION N/A 03/13/2015   Procedure: CARDIOVERSION;  Surgeon: Jerline Pain, MD;  Location: [place];  Service: Cardiovascular;  Laterality: N/A;  . COLONOSCOPY WITH PROPOFOL N/A 07/19/2015   Procedure: COLONOSCOPY WITH PROPOFOL;  Surgeon: Manya Silvas, MD;  Location: [facility] ENDOSCOPY;  Service: Endoscopy;  Laterality: N/A;  . COLONOSCOPY WITH PROPOFOL N/A 01/27/2019   Procedure: COLONOSCOPY WITH PROPOFOL;  Surgeon: Manya Silvas, MD;  Location: [facility] ENDOSCOPY;  Service: Endoscopy;  Laterality: N/A;  . cryo surgery of prostate    . ESOPHAGOGASTRODUODENOSCOPY N/A 01/27/2019   Procedure: ESOPHAGOGASTRODUODENOSCOPY (EGD);  Surgeon: Manya Silvas, MD;  Location: [facility] ENDOSCOPY;  Service: Endoscopy;  Laterality: N/A;  . ESOPHAGOGASTRODUODENOSCOPY (EGD) WITH PROPOFOL N/A 07/19/2015   Procedure: ESOPHAGOGASTRODUODENOSCOPY (EGD) WITH PROPOFOL;  Surgeon: Manya Silvas, MD;  Location: [facility] ENDOSCOPY;  Service: Endoscopy;  Laterality: N/A;  . LAPAROTOMY N/A 03/08/2015   Procedure: EXPLORATORY LAPAROTOMY;  Surgeon: Autumn Messing III, MD;  Location: [place];  Service: General;  Laterality: N/A;  . LAPAROTOMY N/A 03/09/2015   Procedure: EXPLORATORY LAPAROTOMY  AND EVACUATION OF HEMATOMA;  Surgeon: Autumn Messing III, MD;  Location: [place];  Service: General;  Laterality: N/A;  . LYSIS OF ADHESION N/A 03/08/2015   Procedure: LYSIS OF ADHESION;  Surgeon: Autumn Messing III, MD;  Location: [place];  Service: General;  Laterality: N/A;  . OMENTECTOMY N/A 03/08/2015   Procedure: OMENTECTOMY;  Surgeon: Autumn Messing III, MD;  Location: [place];  Service: General;  Laterality: N/A;  . TEE WITHOUT CARDIOVERSION N/A 03/13/2015   Procedure: TRANSESOPHAGEAL ECHOCARDIOGRAM (TEE);  Surgeon: Jerline Pain, MD;  Location: [place];  Service: Cardiovascular;  Laterality: N/A;  . TOTAL KNEE ARTHROPLASTY Left 04/08/2016   Procedure: LEFT TOTAL KNEE ARTHROPLASTY;  Surgeon: Leandrew Koyanagi, MD;  Location: [place];  Service: Orthopedics;  Laterality: Left;  . UMBILICAL HERNIA REPAIR  03/08/2015   Procedure: UMBILICAL HERNIA REPAIR  ADULT;  Surgeon: Autumn Messing III, MD;  Location: [place];  Service: General;;  . VENTRAL HERNIA REPAIR N/A 03/08/2015   Procedure:  VENTRAL HERNIA  REPAIR ADULT;  Surgeon: Autumn Messing III, MD;  Location: [place];  Service: General;  Laterality: N/A;   Social History   Socioeconomic History  . Marital status: Married    Spouse name: Not on file  . Number of children: Not on file  . Years of education: Not on file  . Highest education level: Not on file  Occupational History  . Not on file  Tobacco Use  . Smoking status: Former Smoker    Years: 20.00  Types: Cigarettes    Quit date: 08/03/1988    Years since quitting: 31.3  . Smokeless tobacco: Never Used  Substance and Sexual Activity  . Alcohol use: No  . Drug use: No  . Sexual activity: Yes  Other Topics Concern  . Not on file  Social History Narrative  . Not on file   Social Determinants of Health   Financial Resource Strain:   . Difficulty of Paying Living Expenses:   Food Insecurity:   . Worried About Charity fundraiser in the Last Year:   . Arboriculturist in the Last Year:   Transportation Needs:     . Film/video editor (Medical):   Marland Kitchen Lack of Transportation (Non-Medical):   Physical Activity:   . Days of Exercise per Week:   . Minutes of Exercise per Session:   Stress:   . Feeling of Stress :   Social Connections:   . Frequency of Communication with Friends and Family:   . Frequency of Social Gatherings with Friends and Family:   . Attends Religious Services:   . Active Member of Clubs or Organizations:   . Attends Archivist Meetings:   Marland Kitchen Marital Status:    No family history on file. Allergies  Allergen Reactions  . Statins Swelling and Other (See Comments)    Legs swelled up and cramped up really bad   Prior to Admission medications   Medication Sig Start Date End Date Taking? Authorizing Provider  acetaminophen (TYLENOL) 500 MG tablet Take 1,000 mg by mouth in the morning, at noon, and at bedtime.   Yes [provider]  apixaban (ELIQUIS) 5 MG TABS tablet Take 5 mg by mouth 2 (two) times daily.   Yes [provider]  Carboxymethylcellul-Glycerin (REFRESH OPTIVE PF OP) Place 1 drop into both eyes in the morning and at bedtime.   Yes [provider]  Cholecalciferol (VITAMIN D3) 5000 units TABS Take 5,000 Units by mouth daily.    Yes [provider]  ferrous sulfate 325 (65 FE) MG tablet Take 325 mg by mouth in the morning and at bedtime.    Yes [provider]  hydrALAZINE (APRESOLINE) 25 MG tablet Take 25 mg by mouth 3 (three) times daily.   Yes [provider]  hydrochlorothiazide (HYDRODIURIL) 25 MG tablet Take 25 mg by mouth daily.   Yes [provider]  hydrocortisone 2.5 % lotion Apply 1 application topically every Monday, Wednesday, and Friday. At night   Yes [provider]  ketoconazole (NIZORAL) 2 % cream Apply 1 application topically 3 (three) times a week. Tues, Thurs, Sat at night   Yes [provider]  loperamide (ANTI-DIARRHEAL) 2 MG capsule Take 2 mg by mouth daily  as needed for diarrhea or loose stools.    Yes [provider]  loratadine (CLARITIN) 10 MG tablet Take 10 mg by mouth daily.   Yes [provider]  meclizine (ANTIVERT) 12.5 MG tablet Take 12.5 mg by mouth daily.    Yes [provider]  memantine (NAMENDA) 10 MG tablet Take 10 mg by mouth 2 (two) times daily.  03/31/19  Yes [provider]  metoprolol (LOPRESSOR) 100 MG tablet Take 1 tablet (100 mg total) by mouth 2 (two) times daily. 07/27/15  Yes Thurnell Lose, MD  mirabegron ER (MYRBETRIQ) 50 MG TB24 tablet Take 1 tablet (50 mg total) by mouth daily. 06/14/19  Yes Stoioff, Ronda Fairly, MD  Omega-3 Fatty Acids (  FISH OIL) 1200 MG CAPS Take 1,200 mg by mouth in the morning, at noon, and at bedtime.   Yes [provider]  omeprazole (PRILOSEC) 20 MG capsule Take 20 mg by mouth daily.   Yes [provider]  OVER THE COUNTER MEDICATION Take 1 capsule by mouth in the morning, at noon, and at bedtime. Cissus XT otc supplement   Yes [provider]  Red Yeast Rice Extract 600 MG CAPS Take 600 mg by mouth 2 (two) times daily.    Yes [provider]  tamsulosin (FLOMAX) 0.4 MG CAPS capsule Take 1 capsule (0.4 mg total) by mouth 2 (two) times a day. 12/15/18  Yes Chrystal, Eulas Post, MD  docusate sodium (COLACE) 100 MG capsule Take 1 capsule (100 mg total) by mouth daily as needed. 11/30/19 11/29/20  Aundra Dubin, PA-C  hydrALAZINE (APRESOLINE) 50 MG tablet Take 1 tablet (50 mg total) by mouth every 8 (eight) hours. Patient not taking: Reported on 11/22/2019 07/27/15   Thurnell Lose, MD  methocarbamol (ROBAXIN) 500 MG tablet Take 1 tablet (500 mg total) by mouth 2 (two) times daily as needed. 11/30/19   Aundra Dubin, PA-C  ondansetron (ZOFRAN) 4 MG tablet Take 1 tablet (4 mg total) by mouth every 8 (eight) hours as needed for nausea or vomiting. 11/30/19   Aundra Dubin, PA-C  oxyCODONE (OXYCONTIN) 10 mg 12 hr tablet Take 1 tablet  (10 mg total) by mouth every 12 (twelve) hours. 11/30/19   Aundra Dubin, PA-C  oxyCODONE (ROXICODONE) 5 MG immediate release tablet Take 1-2 tabs po every 6-8 hours prn pain 11/30/19   Aundra Dubin, PA-C     Positive ROS: All other systems have been reviewed and were otherwise negative with the exception of those mentioned in the HPI and as above.  Physical Exam: General: Alert, no acute distress Cardiovascular: No pedal edema Respiratory: No cyanosis, no use of accessory musculature GI: abdomen soft Skin: No lesions in the area of chief complaint Neurologic: Sensation intact distally Psychiatric: Patient is competent for consent with normal mood and affect Lymphatic: no lymphedema  MUSCULOSKELETAL: exam stable  Assessment: right knee osteoarthritis  Plan: Plan for Procedure(s): RIGHT TOTAL KNEE ARTHROPLASTY  The risks benefits and alternatives were discussed with the patient including but not limited to the risks of nonoperative treatment, versus surgical intervention including infection, bleeding, nerve injury,  blood clots, cardiopulmonary complications, morbidity, mortality, among others, and they were willing to proceed.   Preoperative templating of the joint replacement has been completed, documented, and submitted to the Operating Room personnel in order to optimize intra-operative equipment management.  Anticipated LOS equal to or greater than 2 midnights due to - Age 60 and older with one or more of the following:  - Obesity  - Expected need for hospital services (PT, OT, Nursing) required for safe  discharge  - Anticipated need for postoperative skilled nursing care or inpatient rehab  - Active co-morbidities: Coronary Artery Disease  Eduard Roux, MD   12/04/2019 7:09 AM

## 2019-12-04 NOTE — Anesthesia Procedure Notes (Signed)
Anesthesia Regional Block: Adductor canal block   Pre-Anesthetic Checklist: ,, timeout performed, Correct Patient, Correct Site, Correct Laterality, Correct Procedure, Correct Position, site marked, Risks and benefits discussed,  Surgical consent,  Pre-op evaluation,  At surgeon's request and post-op pain management  Laterality: Right  Prep: chloraprep       Needles:  Injection technique: Single-shot  Needle Type: Echogenic Stimulator Needle     Needle Length: 5cm  Needle Gauge: 22     Additional Needles:   Procedures:, nerve stimulator,,, ultrasound used (permanent image in chart),,,,  Narrative:  Start time: 12/04/2019 9:20 AM End time: 12/04/2019 9:25 AM Injection made incrementally with aspirations every 5 mL.  Performed by: Personally  Anesthesiologist: Janeece Riggers, MD  Additional Notes: Functioning IV was confirmed and monitors were applied.  A 79mm 22ga Arrow echogenic stimulator needle was used. Sterile prep and drape,hand hygiene and sterile gloves were used. Ultrasound guidance: relevant anatomy identified, needle position confirmed, local anesthetic spread visualized around nerve(s)., vascular puncture avoided.  Image printed for medical record. Negative aspiration and negative test dose prior to incremental administration of local anesthetic. The patient tolerated the procedure well.

## 2019-12-04 NOTE — Progress Notes (Signed)
Orthopedic Tech Progress Note Patient Details:  Jim Dodson 05/12/1941 [id number]  CPM Right Knee CPM Right Knee: On Right Knee Flexion (Degrees): 0 Right Knee Extension (Degrees): 90 Additional Comments: added ice  Post Interventions Patient Tolerated: Well Instructions Provided: [facility] [place] 12/04/2019, 1:33 PM

## 2019-12-04 NOTE — Evaluation (Signed)
Physical Therapy Evaluation Patient Details Name: Jim Dodson MRN: [id number] DOB: [birth year] Today's Date: 12/04/2019   History of Present Illness  Pt is a 79 y/o male s/p R TKA. PMH includes a fib, CAD, dementia, HTN, and L TKA.   Clinical Impression  Pt is s/p surgery above with deficits below. Pt requiring min guard A to ambulate to chair this session. Reporting increased pain and wooziness, so mobility limited. Educated about knee precautions. Will continue to follow acutely to maximize functional mobility independence and safety.     Follow Up Recommendations Follow surgeon's recommendation for DC plan and follow-up therapies;Supervision for mobility/OOB    Equipment Recommendations  None recommended by PT    Recommendations for Other Services       Precautions / Restrictions Precautions Precautions: Knee Precaution Booklet Issued: No Precaution Comments: Verbally reviewed knee precautions.  Restrictions Weight Bearing Restrictions: Yes RLE Weight Bearing: Weight bearing as tolerated      Mobility  Bed Mobility Overal bed mobility: Needs Assistance Bed Mobility: Supine to Sit     Supine to sit: Supervision     General bed mobility comments: Supervision for safety and line management. Increased time secondary to pain.   Transfers Overall transfer level: Needs assistance Equipment used: Rolling Porco (2 wheeled) Transfers: Sit to/from Stand Sit to Stand: Min guard         General transfer comment: Min guard for steadying assist. Cues for safe hand placement.   Ambulation/Gait Ambulation/Gait assistance: Min guard Gait Distance (Feet): 3 Feet Assistive device: Rolling Char (2 wheeled) Gait Pattern/deviations: Step-to pattern;Decreased step length - right;Decreased step length - left;Decreased weight shift to right;Antalgic Gait velocity: Decreased   General Gait Details: Slow, antalgic gait. Pt reporting increased pain and wooziness, therefore,  mobility limited to chair. Cues for sequencing using RW.   Stairs            Wheelchair Mobility    Modified Rankin (Stroke Patients Only)       Balance Overall balance assessment: Needs assistance   Sitting balance-Leahy Scale: Fair     Standing balance support: Bilateral upper extremity supported;During functional activity Standing balance-Leahy Scale: Poor Standing balance comment: Reliant on BUE support                              Pertinent Vitals/Pain Pain Assessment: Faces Faces Pain Scale: Hurts even more Pain Location: R knee Pain Descriptors / Indicators: Aching;Operative site guarding;Grimacing Pain Intervention(s): Monitored during session;Limited activity within patient's tolerance;Repositioned    Home Living Family/patient expects to be discharged to:: Private residence Living Arrangements: Spouse/significant other Available Help at Discharge: Family Type of Home: House Home Access: Stairs to enter Entrance Stairs-Rails: Right;Left;Can reach both Entrance Stairs-Number of Steps: 4-5 Home Layout: One level Home Equipment: Environmental consultant - 2 wheels;Bedside commode;Other (comment)(CPM)      Prior Function Level of Independence: Independent               Hand Dominance        Extremity/Trunk Assessment   Upper Extremity Assessment Upper Extremity Assessment: Defer to OT evaluation    Lower Extremity Assessment Lower Extremity Assessment: RLE deficits/detail RLE Deficits / Details: Deficits consistent with post op pain and weakness.     Cervical / Trunk Assessment Cervical / Trunk Assessment: Normal  Communication   Communication: No difficulties  Cognition Arousal/Alertness: Awake/alert Behavior During Therapy: WFL for tasks assessed/performed Overall Cognitive Status: History of cognitive impairments -  at baseline                                        General Comments General comments (skin integrity,  edema, etc.): Pt's wife present throughout session.     Exercises Total Joint Exercises Ankle Circles/Pumps: AROM;Both;20 reps;Supine   Assessment/Plan    PT Assessment Patient needs continued PT services  PT Problem List Decreased strength;Decreased range of motion;Decreased activity tolerance;Decreased balance;Decreased mobility;Decreased knowledge of use of DME;Decreased knowledge of precautions;Pain       PT Treatment Interventions DME instruction;Gait training;Stair training;Functional mobility training;Therapeutic activities;Therapeutic exercise;Balance training;Patient/family education    PT Goals (Current goals can be found in the Care Plan section)  Acute Rehab PT Goals Patient Stated Goal: to go home PT Goal Formulation: With patient Time For Goal Achievement: 12/18/19 Potential to Achieve Goals: Good    Frequency 7X/week   Barriers to discharge        Co-evaluation               AM-PAC PT "6 Clicks" Mobility  Outcome Measure Help needed turning from your back to your side while in a flat bed without using bedrails?: None Help needed moving from lying on your back to sitting on the side of a flat bed without using bedrails?: None Help needed moving to and from a bed to a chair (including a wheelchair)?: A Little Help needed standing up from a chair using your arms (e.g., wheelchair or bedside chair)?: A Little Help needed to walk in hospital room?: A Little Help needed climbing 3-5 steps with a railing? : A Lot 6 Click Score: 19    End of Session Equipment Utilized During Treatment: Gait belt Activity Tolerance: Patient limited by pain Patient left: with call bell/phone within reach;in chair;with chair alarm set Nurse Communication: Mobility status PT Visit Diagnosis: Muscle weakness (generalized) (M62.81);Other abnormalities of gait and mobility (R26.89);Pain Pain - Right/Left: Right Pain - part of body: Knee    Time: [id number] PT Time Calculation  (min) (ACUTE ONLY): 27 min   Charges:   PT Evaluation $PT Eval Low Complexity: 1 Low PT Treatments $Therapeutic Activity: 8-22 mins        Lou Miner, DPT  Acute Rehabilitation Services  Pager: [phone] Office: [phone]   Rudean Hitt 12/04/2019, 5:58 PM

## 2019-12-04 NOTE — Transfer of Care (Signed)
Immediate Anesthesia Transfer of Care Note  Patient: Jim Dodson  Procedure(s) Performed: RIGHT TOTAL KNEE ARTHROPLASTY (Right Knee)  Patient Location: PACU  Anesthesia Type:MAC combined with regional for post-op pain  Level of Consciousness: awake and patient cooperative  Airway & Oxygen Therapy: Patient Spontanous Breathing and Patient connected to face mask oxygen  Post-op Assessment: Report given to RN and Post -op Vital signs reviewed and stable  Post vital signs: Reviewed and stable  Last Vitals:  Vitals Value Taken Time  BP 105/71 12/04/19 1229  Temp    Pulse 66 12/04/19 1230  Resp 14 12/04/19 1230  SpO2 99 % 12/04/19 1230  Vitals shown include unvalidated device data.  Last Pain:  Vitals:   12/04/19 0833  TempSrc:   PainSc: 0-No pain         Complications: No apparent anesthesia complications

## 2019-12-04 NOTE — Plan of Care (Signed)
  Problem: Pain Managment: Goal: General experience of comfort will improve Outcome: Progressing   Problem: Activity: Goal: Risk for activity intolerance will decrease Outcome: Progressing   

## 2019-12-04 NOTE — Op Note (Signed)
Total Knee Arthroplasty Procedure Note  Preoperative diagnosis: Right knee osteoarthritis  Postoperative diagnosis:same  Operative procedure: Right total knee arthroplasty. CPT [phone]  Surgeon: N. Eduard Roux, MD  Assist: Madalyn Rob, PA-C; necessary for the timely completion of procedure and due to complexity of procedure.  Anesthesia: Spinal, regional, local  Tourniquet time: see anesthesia record  Implants used: Smith and Nephew Femur: Legion PS 5 Tibia: 5 Patella: 35 mm Polyethylene: 11 mm  Indication: Jim Dodson is a 79 y.o. year old male with a history of knee pain. Having failed conservative management, the patient elected to proceed with a total knee arthroplasty.  We have reviewed the risk and benefits of the surgery and they elected to proceed after voicing understanding.  Procedure:  After informed consent was obtained and understanding of the risk were voiced including but not limited to bleeding, infection, damage to surrounding structures including nerves and vessels, blood clots, leg length inequality and the failure to achieve desired results, the operative extremity was marked with verbal confirmation of the patient in the holding area.   The patient was then brought to the operating room and transported to the operating room table in the supine position.  A tourniquet was applied to the operative extremity around the upper thigh. The operative limb was then prepped and draped in the usual sterile fashion and preoperative antibiotics were administered.  A time out was performed prior to the start of surgery confirming the correct extremity, preoperative antibiotic administration, as well as team members, implants and instruments available for the case. Correct surgical site was also confirmed with preoperative radiographs. The limb was then elevated for exsanguination and the tourniquet was inflated. A midline incision was made and a standard medial  parapatellar approach was performed.  The infrapatellar fat pad was removed.  Suprapatellar synovium was removed to reveal the anterior distal femoral cortex.  A medial peel was performed to release the capsule of the medial tibial plateau.  The patella was then everted and was prepared and sized to a 35 mm.  A cover was placed on the patella for protection from retractors.  The knee was then brought into full flexion and we then turned our attention to the femur.  The cruciates were sacrificed.  Start site was drilled in the femur and the intramedullary distal femoral cutting guide was placed, set at 5 degrees valgus, taking 9 mm of distal resection. The distal cut was made. Osteophytes were then removed.  Next, the proximal tibial cutting guide was placed with appropriate slope, varus/valgus alignment and depth of resection. The proximal tibial cut was made. Gap blocks were then used to assess the extension gap and alignment, and appropriate soft tissue releases were performed. Attention was turned back to the femur, which was sized using the sizing guide to a size 5. Appropriate rotation of the femoral component was determined using epicondylar axis, Whiteside's line, and assessing the flexion gap under ligament tension. The appropriate size 4-in-1 cutting block was placed and checked with an angel wing and cuts were made. Posterior femoral osteophytes and uncapped bone were then removed with the curved osteotome.  Trial components were placed, and stability was checked in full extension, mid-flexion, and deep flexion. Proper tibial rotation was determined and marked.  The patella tracked well without a lateral release.  The femoral lugs were then drilled. Trial components were then removed and tibial preparation performed.  The tibia was sized for a size 5 component.  A posterior  capsular injection comprising of 20 cc of 1.3% exparel, 20 cc of 0.25% bupivicaine with epi and 20 cc of normal saline was performed  for postoperative pain control. The bony surfaces were irrigated with a pulse lavage and then dried. Bone cement was vacuum mixed on the back table, and the final components sized above were cemented into place. After cement had finished curing, excess cement was removed. The stability of the construct was re-evaluated throughout a range of motion and found to be acceptable. The trial liner was removed, the knee was copiously irrigated, and the knee was re-evaluated for any excess bone debris. The real polyethylene liner, 11 mm thick, was inserted and checked to ensure the locking mechanism had engaged appropriately. The tourniquet was deflated and hemostasis was achieved. The wound was irrigated with normal saline.  One gram of vancomycin powder was placed in the surgical bed.  Capsular closure was performed with a #1 vicryl, subcutaneous fat closed with a 0 vicryl suture, then subcutaneous tissue closed with interrupted 2.0 vicryl suture. The skin was then closed with a 3.0 monocryl and steri strips. A sterile dressing was applied.  The patient was awakened in the operating room and taken to recovery in stable condition. All sponge, needle, and instrument counts were correct at the end of the case.  Position: supine  Complications: none.  Time Out: performed   Drains/Packing: none  Estimated blood loss: minimal  Returned to Recovery Room: in good condition.   Antibiotics: yes   Mechanical VTE (DVT) Prophylaxis: sequential compression devices, TED thigh-high  Chemical VTE (DVT) Prophylaxis: eliquis  Fluid Replacement  Crystalloid: see anesthesia record Blood: none  FFP: none   Specimens Removed: 1 to pathology   Sponge and Instrument Count Correct? yes   PACU: portable radiograph - knee AP and Lateral   Plan/RTC: Return in 2 weeks for wound check.   Weight Bearing/Load Lower Extremity: full   N. Eduard Roux, MD [facility] 11:51 AM

## 2019-12-04 NOTE — Plan of Care (Signed)

## 2019-12-05 ENCOUNTER — Encounter: Payer: Self-pay | Admitting: *Deleted

## 2019-12-05 DIAGNOSIS — M1711 Unilateral primary osteoarthritis, right knee: Secondary | ICD-10-CM | POA: Diagnosis not present

## 2019-12-05 DIAGNOSIS — Z96651 Presence of right artificial knee joint: Secondary | ICD-10-CM | POA: Diagnosis not present

## 2019-12-05 LAB — CBC
HCT: 32.7 % — ABNORMAL LOW (ref 39.0–52.0)
Hemoglobin: 10.6 g/dL — ABNORMAL LOW (ref 13.0–17.0)
MCH: 30.1 pg (ref 26.0–34.0)
MCHC: 32.4 g/dL (ref 30.0–36.0)
MCV: 92.9 fL (ref 80.0–100.0)
Platelets: 131 10*3/uL — ABNORMAL LOW (ref 150–400)
RBC: 3.52 MIL/uL — ABNORMAL LOW (ref 4.22–5.81)
RDW: 14.9 % (ref 11.5–15.5)
WBC: 8.9 10*3/uL (ref 4.0–10.5)
nRBC: 0 % (ref 0.0–0.2)

## 2019-12-05 LAB — BASIC METABOLIC PANEL
Anion gap: 9 (ref 5–15)
BUN: 16 mg/dL (ref 8–23)
CO2: 26 mmol/L (ref 22–32)
Calcium: 8.2 mg/dL — ABNORMAL LOW (ref 8.9–10.3)
Chloride: 102 mmol/L (ref 98–111)
Creatinine, Ser: 1.23 mg/dL (ref 0.61–1.24)
GFR calc Af Amer: >60 mL/min (ref >60)
GFR calc non Af Amer: 56 mL/min — ABNORMAL LOW (ref >60)
Glucose, Bld: 151 mg/dL — ABNORMAL HIGH (ref 70–99)
Potassium: 3.4 mmol/L — ABNORMAL LOW (ref 3.5–5.1)
Sodium: 137 mmol/L (ref 135–145)

## 2019-12-05 NOTE — TOC Transition Note (Signed)
Transition of Care [facility]) - CM/SW Discharge Note   Patient Details  Name: Jim Dodson MRN: [id number] Date of Birth: June 07, 1941  Transition of Care [facility]) CM/SW Contact:  Sharin Mons, RN Phone Number: [phone] 12/05/2019, 12:23 PM   Clinical Narrative:    Patient will DC to: home Anticipated DC date: 12/05/2019 Family notified: wife  Per MD patient ready for DC to home with home health services . [facility] to provide services. Services arranged prior to hospital visit for [place]. Pt without Rx med concerns. Pt without DME needs.    Wife to provide transportation to home.  NCM will sign off for now as intervention is no longer needed. Please consult [place] again if new needs arise.   Final next level of care: Home w Home Health Services Barriers to Discharge: No Barriers Identified   Patient Goals and CMS Choice        Discharge Placement                       Discharge Plan and Services                DME Arranged: N/A         HH Arranged: PT HH Agency: [facility] (formerly [employer]) Date [place]: 12/05/19 Time [place]: 1222    Social Determinants of Health ([place]) Interventions     Readmission Risk Interventions No flowsheet data found.

## 2019-12-05 NOTE — Anesthesia Postprocedure Evaluation (Signed)
Anesthesia Post Note  Patient: Jim Dodson  Procedure(s) Performed: RIGHT TOTAL KNEE ARTHROPLASTY (Right Knee)     Patient location during evaluation: PACU Anesthesia Type: MAC and Spinal Level of consciousness: awake and alert Pain management: pain level controlled Vital Signs Assessment: post-procedure vital signs reviewed and stable Respiratory status: spontaneous breathing, nonlabored ventilation, respiratory function stable and patient connected to nasal cannula oxygen Cardiovascular status: stable and blood pressure returned to baseline Postop Assessment: no apparent nausea or vomiting Anesthetic complications: no    Last Vitals:  Vitals:   12/04/19 2140 12/05/19 0351  BP: 127/76 131/67  Pulse: 87 69  Resp:  17  Temp:  36.4 C  SpO2:  95%    Last Pain:  Vitals:   12/05/19 0400  TempSrc:   PainSc: Asleep                 Austine Wiedeman

## 2019-12-05 NOTE — Progress Notes (Signed)
Physical Therapy Treatment Patient Details Name: Jim Dodson MRN: [id number] DOB: [birth year] Today's Date: 12/05/2019    History of Present Illness Pt is a 79 y/o male s/p R TKA. PMH includes a fib, CAD, dementia, HTN, and L TKA.     PT Comments    Pt progressing very well today. Spent an hour in CPM this morning. Able to come to EOB with supervision, no physical assist needed. Min A for ambulation with vc's for safety, 25' with RW. Pt desired to stop at that point to eat breakfast. Tolerated LE there ex in supine and seated with knee ROM 0-95 deg. Plan to ambulate further and practice steps this afternoon. PT will continue to follow.    Follow Up Recommendations  Follow surgeon's recommendation for DC plan and follow-up therapies;Supervision for mobility/OOB     Equipment Recommendations  None recommended by PT    Recommendations for Other Services       Precautions / Restrictions Precautions Precautions: Knee Precaution Booklet Issued: No Precaution Comments: discussed indications for CPM  Restrictions Weight Bearing Restrictions: Yes RLE Weight Bearing: Weight bearing as tolerated    Mobility  Bed Mobility Overal bed mobility: Needs Assistance Bed Mobility: Supine to Sit     Supine to sit: Supervision     General bed mobility comments: pt able to lift RLE and come to EOB safely without physical assist (bed in flat position)  Transfers Overall transfer level: Needs assistance Equipment used: Rolling [place] (2 wheeled) Transfers: Sit to/from Stand Sit to Stand: Min guard         General transfer comment: reminder for hand placement.   Ambulation/Gait Ambulation/Gait assistance: Min guard;Min assist Gait Distance (Feet): 15 Feet Assistive device: Rolling [place] (2 wheeled) Gait Pattern/deviations: Step-to pattern;Decreased weight shift to right;Antalgic Gait velocity: Decreased Gait velocity interpretation: <1.31 ft/sec, indicative of household  ambulator General Gait Details: pt attempting to take too long steps and unsafe with this, vc's for shortening step length and staying close to RW. Was able to progress from min guard to min with this pattern   Stairs             Wheelchair Mobility    Modified Rankin (Stroke Patients Only)       Balance Overall balance assessment: Needs assistance Sitting-balance support: No upper extremity supported;Feet supported Sitting balance-Leahy Scale: Good     Standing balance support: Bilateral upper extremity supported;During functional activity Standing balance-Leahy Scale: Poor Standing balance comment: Reliant on BUE support                             Cognition Arousal/Alertness: Awake/alert Behavior During Therapy: WFL for tasks assessed/performed Overall Cognitive Status: History of cognitive impairments - at baseline                                 General Comments: pt has dementia at baseline      Exercises Total Joint Exercises Ankle Circles/Pumps: AROM;Both;20 reps;Supine Quad Sets: AROM;Both;10 reps;Supine Heel Slides: AROM;Right;5 reps;Seated;Supine;10 reps Hip ABduction/ADduction: AAROM;Right;10 reps;Supine Straight Leg Raises: AROM;Right;5 reps;Supine Long Arc Quad: AROM;Right;10 reps;Seated Goniometric ROM: 0-95    General Comments General comments (skin integrity, edema, etc.): wife present, discussed appropriate d/c activity level      Pertinent Vitals/Pain Pain Assessment: 0-10 Pain Score: 8  Faces Pain Scale: Hurts even more Pain Location: R knee Pain Descriptors / Indicators:  Aching;Operative site guarding;Grimacing Pain Intervention(s): Limited activity within patient's tolerance;Monitored during session    [place] expects to be discharged to:: Private residence Living Arrangements: Spouse/significant other Available Help at Discharge: Family Type of Home: House Home Access: Stairs to  enter Entrance Stairs-Rails: Right;Left;Can reach both Home Layout: One [place]: Environmental consultant - 2 wheels;Bedside commode;Other (comment)(CPM)      Prior Function Level of Independence: Needs assistance    ADL's / Homemaking Assistance Needed: wife reports pt was still driving to familiar places due to cognitive limitations, pt with dementia at baseline;wife was assisting with medication management and cooking;otherwise pt is independent with bathing, dressing, grooming     PT Goals (current goals can now be found in the care plan section) Acute Rehab PT Goals Patient Stated Goal: to go home PT Goal Formulation: With patient Time For Goal Achievement: 12/18/19 Potential to Achieve Goals: Good Progress towards PT goals: Progressing toward goals    Frequency    7X/week      PT Plan Current plan remains appropriate    Co-evaluation              AM-PAC PT "6 Clicks" Mobility   Outcome Measure  Help needed turning from your back to your side while in a flat bed without using bedrails?: None Help needed moving from lying on your back to sitting on the side of a flat bed without using bedrails?: None Help needed moving to and from a bed to a chair (including a wheelchair)?: A Little Help needed standing up from a chair using your arms (e.g., wheelchair or bedside chair)?: A Little Help needed to walk in hospital room?: A Little Help needed climbing 3-5 steps with a railing? : A Lot 6 Click Score: 19    End of Session Equipment Utilized During Treatment: Gait belt Activity Tolerance: Patient tolerated treatment well Patient left: with call bell/phone within reach;in chair;with chair alarm set;with family/visitor present Nurse Communication: Mobility status PT Visit Diagnosis: Muscle weakness (generalized) (M62.81);Other abnormalities of gait and mobility (R26.89);Pain Pain - Right/Left: Right Pain - part of body: Knee     Time: [id number] PT Time Calculation  (min) (ACUTE ONLY): 14 min  Charges:  $Therapeutic Exercise: 8-22 mins                     Leighton Roach, PT  Acute Rehab Services  Pager [phone] Office [place] 12/05/2019, 10:48 AM

## 2019-12-05 NOTE — Discharge Instructions (Signed)
° °INSTRUCTIONS AFTER JOINT REPLACEMENT  ° °o Remove items at home which could result in a fall. This includes throw rugs or furniture in walking pathways °o ICE to the affected joint every three hours while awake for 30 minutes at a time, for at least the first 3-5 days, and then as needed for pain and swelling.  Continue to use ice for pain and swelling. You may notice swelling that will progress down to the foot and ankle.  This is normal after surgery.  Elevate your leg when you are not up walking on it.   °o Continue to use the breathing machine you got in the hospital (incentive spirometer) which will help keep your temperature down.  It is common for your temperature to cycle up and down following surgery, especially at night when you are not up moving around and exerting yourself.  The breathing machine keeps your lungs expanded and your temperature down. ° ° °DIET:  As you were doing prior to hospitalization, we recommend a well-balanced diet. ° °DRESSING / WOUND CARE / SHOWERING ° °You may change your surgical dressing 7 days after surgery.  Then change the dressing every day with sterile gauze.  Please use good hand washing techniques before changing the dressing.  Do not use any lotions or creams on the incision until instructed by your surgeon.  You may shower while you have the surgical dressing which is waterproof.  After removal of surgical dressing, you must cover the incision when showering. ° °ACTIVITY ° °o Increase activity slowly as tolerated, but follow the weight bearing instructions below.   °o No driving for 6 weeks or until further direction given by your physician.  You cannot drive while taking narcotics.  °o No lifting or carrying greater than 10 lbs. until further directed by your surgeon. °o Avoid periods of inactivity such as sitting longer than an hour when not asleep. This helps prevent blood clots.  °o You may return to work once you are authorized by your doctor.  ° ° ° °WEIGHT  BEARING  ° °Weight bearing as tolerated with assist device (Pacitti, cane, etc) as directed, use it as long as suggested by your surgeon or therapist, typically at least 4-6 weeks. ° ° °EXERCISES ° °Results after joint replacement surgery are often greatly improved when you follow the exercise, range of motion and muscle strengthening exercises prescribed by your doctor. Safety measures are also important to protect the joint from further injury. Any time any of these exercises cause you to have increased pain or swelling, decrease what you are doing until you are comfortable again and then slowly increase them. If you have problems or questions, call your caregiver or physical therapist for advice.  ° °Rehabilitation is important following a joint replacement. After just a few days of immobilization, the muscles of the leg can become weakened and shrink (atrophy).  These exercises are designed to build up the tone and strength of the thigh and leg muscles and to improve motion. Often times heat used for twenty to thirty minutes before working out will loosen up your tissues and help with improving the range of motion but do not use heat for the first two weeks following surgery (sometimes heat can increase post-operative swelling).  ° °These exercises can be done on a training (exercise) mat, on the floor, on a table or on a bed. Use whatever works the best and is most comfortable for you.    Use music or television   while you are exercising so that the exercises are a pleasant break in your day. This will make your life better with the exercises acting as a break in your routine that you can look forward to.   Perform all exercises about fifteen times, three times per day or as directed.  You should exercise both the operative leg and the other leg as well. ° °Exercises include: °  °• Quad Sets - Tighten up the muscle on the front of the thigh (Quad) and hold for 5-10 seconds.   °• Straight Leg Raises - With your  knee straight (if you were given a brace, keep it on), lift the leg to 60 degrees, hold for 3 seconds, and slowly lower the leg.  Perform this exercise against resistance later as your leg gets stronger.  °• Leg Slides: Lying on your back, slowly slide your foot toward your buttocks, bending your knee up off the floor (only go as far as is comfortable). Then slowly slide your foot back down until your leg is flat on the floor again.  °• Angel Wings: Lying on your back spread your legs to the side as far apart as you can without causing discomfort.  °• Hamstring Strength:  Lying on your back, push your heel against the floor with your leg straight by tightening up the muscles of your buttocks.  Repeat, but this time bend your knee to a comfortable angle, and push your heel against the floor.  You may put a pillow under the heel to make it more comfortable if necessary.  ° °A rehabilitation program following joint replacement surgery can speed recovery and prevent re-injury in the future due to weakened muscles. Contact your doctor or a physical therapist for more information on knee rehabilitation.  ° ° °CONSTIPATION ° °Constipation is defined medically as fewer than three stools per week and severe constipation as less than one stool per week.  Even if you have a regular bowel pattern at home, your normal regimen is likely to be disrupted due to multiple reasons following surgery.  Combination of anesthesia, postoperative narcotics, change in appetite and fluid intake all can affect your bowels.  ° °YOU MUST use at least one of the following options; they are listed in order of increasing strength to get the job done.  They are all available over the counter, and you may need to use some, POSSIBLY even all of these options:   ° °Drink plenty of fluids (prune juice may be helpful) and high fiber foods °Colace 100 mg by mouth twice a day  °Senokot for constipation as directed and as needed Dulcolax (bisacodyl), take  with full glass of water  °Miralax (polyethylene glycol) once or twice a day as needed. ° °If you have tried all these things and are unable to have a bowel movement in the first 3-4 days after surgery call either your surgeon or your primary doctor.   ° °If you experience loose stools or diarrhea, hold the medications until you stool forms back up.  If your symptoms do not get better within 1 week or if they get worse, check with your doctor.  If you experience "the worst abdominal pain ever" or develop nausea or vomiting, please contact the office immediately for further recommendations for treatment. ° ° °ITCHING:  If you experience itching with your medications, try taking only a single pain pill, or even half a pain pill at a time.  You can also use Benadryl over the   counter for itching or also to help with sleep.  ° °TED HOSE STOCKINGS:  Use stockings on both legs until for at least 2 weeks or as directed by physician office. They may be removed at night for sleeping. ° °MEDICATIONS:  See your medication summary on the “After Visit Summary” that nursing will review with you.  You may have some home medications which will be placed on hold until you complete the course of blood thinner medication.  It is important for you to complete the blood thinner medication as prescribed. ° °PRECAUTIONS:  If you experience chest pain or shortness of breath - call 911 immediately for transfer to the hospital emergency department.  ° °If you develop a fever greater that 101 F, purulent drainage from wound, increased redness or drainage from wound, foul odor from the wound/dressing, or calf pain - CONTACT YOUR SURGEON.   °                                                °FOLLOW-UP APPOINTMENTS:  If you do not already have a post-op appointment, please call the office for an appointment to be seen by your surgeon.  Guidelines for how soon to be seen are listed in your “After Visit Summary”, but are typically between 1-4 weeks  after surgery. ° °OTHER INSTRUCTIONS:  ° °Knee Replacement:  Do not place pillow under knee, focus on keeping the knee straight while resting. CPM instructions: 0-90 degrees, 2 hours in the morning, 2 hours in the afternoon, and 2 hours in the evening. Place foam block, curve side up under heel at all times except when in CPM or when walking.  DO NOT modify, tear, cut, or change the foam block in any way. ° °MAKE SURE YOU:  °• Understand these instructions.  °• Get help right away if you are not doing well or get worse.  ° ° °Thank you for letting us be a part of your medical care team.  It is a privilege we respect greatly.  We hope these instructions will help you stay on track for a fast and full recovery!  ° °=========================================== ° °Information on my medicine - ELIQUIS® (apixaban) ° °Why was Eliquis® prescribed for you? °Eliquis® was prescribed for you to reduce the risk of a blood clot forming that can cause a stroke if you have a medical condition called atrial fibrillation (a type of irregular heartbeat). ° °What do You need to know about Eliquis® ? °Take your Eliquis® TWICE DAILY - one tablet in the morning and one tablet in the evening with or without food. If you have difficulty swallowing the tablet whole please discuss with your pharmacist how to take the medication safely. ° °Take Eliquis® exactly as prescribed by your doctor and DO NOT stop taking Eliquis® without talking to the doctor who prescribed the medication.  Stopping may increase your risk of developing a stroke.  Refill your prescription before you run out. ° °After discharge, you should have regular check-up appointments with your healthcare provider that is prescribing your Eliquis®.  In the future your dose may need to be changed if your kidney function or weight changes by a significant amount or as you get older. ° °What do you do if you miss a dose? °If you miss a dose, take it as soon as you remember on the   same  day and resume taking twice daily.  Do not take more than one dose of ELIQUIS at the same time to make up a missed dose. ° °Important Safety Information °A possible side effect of Eliquis® is bleeding. You should call your healthcare provider right away if you experience any of the following: °? Bleeding from an injury or your nose that does not stop. °? Unusual colored urine (red or dark brown) or unusual colored stools (red or black). °? Unusual bruising for unknown reasons. °? A serious fall or if you hit your head (even if there is no bleeding). ° °Some medicines may interact with Eliquis® and might increase your risk of bleeding or clotting while on Eliquis®. To help avoid this, consult your healthcare provider or pharmacist prior to using any new prescription or non-prescription medications, including herbals, vitamins, non-steroidal anti-inflammatory drugs (NSAIDs) and supplements. ° °This website has more information on Eliquis® (apixaban): http://www.eliquis.com/eliquis/home ° °

## 2019-12-05 NOTE — Plan of Care (Signed)

## 2019-12-05 NOTE — Evaluation (Signed)
Occupational Therapy Evaluation Patient Details Name: SKAI FARAR MRN: [id number] DOB: 09-30-1940 Today's Date: 12/05/2019    History of Present Illness Pt is a 79 y/o male s/p R TKA. PMH includes a fib, CAD, dementia, HTN, and L TKA.    Clinical Impression   PTA, pt was living at home with his wife, pt was independent with ADL had assistance from wife for IADL. Pt was independent with functional mobility. Pt currently requires minguard for functional mobility at RW level, minA for simulated tub transfer and minA for LB dressing. Pt able to tolerate CPM 0-90. Pt would benefit from continued acute OT services to maximize safety and independence with ADL/IADL and functional mobility to allow safe d/c home with his wife.      Follow Up Recommendations  Follow surgeon's recommendation for DC plan and follow-up therapies    Equipment Recommendations  None recommended by OT    Recommendations for Other Services       Precautions / Restrictions Precautions Precautions: Knee Precaution Booklet Issued: No Precaution Comments: discussed indications for CPM  Restrictions Weight Bearing Restrictions: Yes RLE Weight Bearing: Weight bearing as tolerated      Mobility Bed Mobility Overal bed mobility: Needs Assistance Bed Mobility: Supine to Sit     Supine to sit: Supervision     General bed mobility comments: pt on BSC upon arrival  Transfers Overall transfer level: Needs assistance Equipment used: Rolling Brinton (2 wheeled) Transfers: Sit to/from Stand Sit to Stand: Min guard         General transfer comment: reminder for hand placement.     Balance Overall balance assessment: Needs assistance Sitting-balance support: No upper extremity supported;Feet supported Sitting balance-Leahy Scale: Good     Standing balance support: No upper extremity supported;During functional activity Standing balance-Leahy Scale: Fair Standing balance comment: able to complete grooming  at sink level without UE support, bilateral UE support for mobility                           ADL either performed or assessed with clinical judgement   ADL Overall ADL's : Needs assistance/impaired Eating/Feeding: Set up;Sitting   Grooming: Min guard;Standing Grooming Details (indicate cue type and reason): completed at sink level Upper Body Bathing: Supervision/ safety;Sitting   Lower Body Bathing: Min guard;Sit to/from stand   Upper Body Dressing : Set up;Sitting   Lower Body Dressing: Minimal assistance;Sit to/from stand   Toilet Transfer: Designer, television/film set Details (indicate cue type and reason): pt on commode upon arrival Toileting- Water quality scientist and Hygiene: Min guard;Sit to/from stand   Tub/ Shower Transfer: Minimal assistance;Tub transfer Tub/Shower Transfer Details (indicate cue type and reason): educated pt and wife on tub transfers Functional mobility during ADLs: Min guard;Rolling K[place]dy General ADL Comments: minguard for safety     Vision         Perception     Praxis      Pertinent Vitals/Pain Pain Assessment: 0-10 Pain Score: 7  Faces Pain Scale: Hurts even more Pain Location: R knee Pain Descriptors / Indicators: Aching;Operative site guarding;Grimacing Pain Intervention(s): Limited activity within patient's tolerance;Monitored during session     Hand Dominance Right   Extremity/Trunk Assessment Upper Extremity Assessment Upper Extremity Assessment: Overall WFL for tasks assessed   Lower Extremity Assessment Lower Extremity Assessment: Defer to PT evaluation RLE Deficits / Details: Deficits consistent with post op pain and weakness. able to tolerate up to 90* in CPM  Cervical / Trunk Assessment Cervical / Trunk Assessment: Normal   Communication Communication Communication: No difficulties   Cognition Arousal/Alertness: Awake/alert Behavior During Therapy: WFL for tasks assessed/performed Overall Cognitive  Status: History of cognitive impairments - at baseline                                 General Comments: pt has dementia at baseline   General Comments  wife present throughout session    Exercises Exercises: Other exercises Total Joint Exercises Ankle Circles/Pumps: AROM;Both;20 reps;Supine Quad Sets: AROM;Both;10 reps;Supine Heel Slides: AROM;Right;5 reps;Seated;Supine;10 reps Hip ABduction/ADduction: AAROM;Right;10 reps;Supine Straight Leg Raises: AROM;Right;5 reps;Supine Long Arc Quad: AROM;Right;10 reps;Seated Goniometric ROM: 0-95 Other Exercises Other Exercises: set pt up in CPM, able to tolerate 0-90* knee ROM   Shoulder Instructions      Home Living Family/patient expects to be discharged to:: Private residence Living Arrangements: Spouse/significant other Available Help at Discharge: Family Type of Home: House Home Access: Stairs to enter [employer] of Steps: 4-5 Entrance Stairs-Rails: Right;Left;Can reach both Home Layout: One level     Bathroom Shower/Tub: Teacher, early years/pre: Standard     Home Equipment: Environmental consultant - 2 wheels;Bedside commode;Other (comment)(CPM)          Prior Functioning/Environment Level of Independence: Needs assistance    ADL's / Homemaking Assistance Needed: wife reports pt was still driving to familiar places due to cognitive limitations, pt with dementia at baseline;wife was assisting with medication management and cooking;otherwise pt is independent with bathing, dressing, grooming            OT Problem List: Decreased range of motion;Impaired balance (sitting and/or standing);Pain      OT Treatment/Interventions: Self-care/ADL training;Therapeutic exercise;DME and/or AE instruction;Balance training;Patient/family education    OT Goals(Current goals can be found in the care plan section) Acute Rehab OT Goals Patient Stated Goal: to go home OT Goal Formulation: With patient Time For  Goal Achievement: 12/19/19 Potential to Achieve Goals: Good ADL Goals Pt Will Perform Grooming: with modified independence;standing Pt Will Perform Lower Body Dressing: with modified independence;sit to/from stand Pt Will Transfer to Toilet: with modified independence;ambulating Pt Will Perform Tub/Shower Transfer: Tub transfer;with modified independence  OT Frequency: Min 2X/week   Barriers to D/C:            Co-evaluation              AM-PAC OT "6 Clicks" Daily Activity     Outcome Measure Help from another person eating meals?: A Little Help from another person taking care of personal grooming?: A Little Help from another person toileting, which includes using toliet, bedpan, or urinal?: A Little Help from another person bathing (including washing, rinsing, drying)?: A Little Help from another person to put on and taking off regular upper body clothing?: A Little Help from another person to put on and taking off regular lower body clothing?: A Little 6 Click Score: 18   End of Session Equipment Utilized During Treatment: Gait belt;Rolling [place] CPM Right Knee CPM Right Knee: On Right Knee Flexion (Degrees): 0 Right Knee Extension (Degrees): 90 Additional Comments: added ice Nurse Communication: Mobility status  Activity Tolerance: Patient tolerated treatment well Patient left: in chair;with call bell/phone within reach;with chair alarm set(with CPM )  OT Visit Diagnosis: Other abnormalities of gait and mobility (R26.89);Pain Pain - Right/Left: Right Pain - part of body: Knee  Time: [id number] OT Time Calculation (min): 36 min Charges:  OT General Charges $OT Visit: 1 Visit OT Evaluation $OT Eval Moderate Complexity: 1 Mod OT Treatments $Self Care/Home Management : 8-22 mins  Helene Kelp OTR/L Acute Rehabilitation Services Office: [place] 12/05/2019, 11:21 AM

## 2019-12-05 NOTE — Progress Notes (Signed)
Patient tolerated CPM for 90 minutes

## 2019-12-05 NOTE — Progress Notes (Signed)
Provided discharge education/instructions, all questions and concerns addressed, Pt not in distress, discharged home with belongings accompanied by wife. 

## 2019-12-05 NOTE — Progress Notes (Signed)
Physical Therapy Treatment Patient Details Name: Jim Dodson MRN: [id number] DOB: 11-03-1940 Today's Date: 12/05/2019    History of Present Illness Pt is a 79 y/o male s/p R TKA. PMH includes a fib, CAD, dementia, HTN, and L TKA.     PT Comments    Pt ambulated 200' with RW and min-guard A, demo increased safety with RW this afternoon. Practiced 5 stairs 2x with rail, vc's for sequencing, wife present for training. Discussed car transfer and initiation of HHPT. Pt with 4/10 R knee pain after session. Pt expecting to d/c home later today.     Follow Up Recommendations  Follow surgeon's recommendation for DC plan and follow-up therapies;Supervision for mobility/OOB     Equipment Recommendations  None recommended by PT    Recommendations for Other Services       Precautions / Restrictions Precautions Precautions: Knee Precaution Booklet Issued: No Precaution Comments: discussed importance of stretching for extension in conjunction with flexion in CPM Restrictions Weight Bearing Restrictions: Yes RLE Weight Bearing: Weight bearing as tolerated    Mobility  Bed Mobility               General bed mobility comments: pt received in chair  Transfers Overall transfer level: Needs assistance Equipment used: Rolling Dahlstrom (2 wheeled) Transfers: Sit to/from Stand Sit to Stand: Supervision         General transfer comment: continues to need cues for hand placement  Ambulation/Gait Ambulation/Gait assistance: Min guard Gait Distance (Feet): 200 Feet Assistive device: Rolling Malkiewicz (2 wheeled) Gait Pattern/deviations: Decreased weight shift to right;Antalgic;Step-through pattern Gait velocity: Decreased Gait velocity interpretation: <1.8 ft/sec, indicate of risk for recurrent falls General Gait Details: pt with safer gait this afternoon, less painful as well. Did better staying close to RW   Stairs Stairs: Yes Stairs assistance: Min guard Stair Management: Two  rails;Step to pattern;Forwards Number of Stairs: 5(2x) General stair comments: vc's for sequencing. Pt did not follow cues first time so practiced again   Wheelchair Mobility    Modified Rankin (Stroke Patients Only)       Balance Overall balance assessment: Needs assistance Sitting-balance support: No upper extremity supported;Feet supported Sitting balance-Leahy Scale: Good     Standing balance support: No upper extremity supported;During functional activity Standing balance-Leahy Scale: Fair                              Cognition Arousal/Alertness: Awake/alert Behavior During Therapy: WFL for tasks assessed/performed Overall Cognitive Status: History of cognitive impairments - at baseline                                 General Comments: pt has dementia at baseline      Exercises Total Joint Exercises Heel Slides: AROM;Right;Seated;10 reps Long Arc Quad: AROM;Right;10 reps;Seated Goniometric ROM: 0-95    General Comments General comments (skin integrity, edema, etc.): discussed car transfer and what to expect from HHPT      Pertinent Vitals/Pain Pain Assessment: 0-10 Pain Score: 4  Pain Location: R knee Pain Descriptors / Indicators: Aching;Sore Pain Intervention(s): Limited activity within patient's tolerance;Monitored during session    Home Living                      Prior Function            PT Goals (current goals can now  be found in the care plan section) Acute Rehab PT Goals Patient Stated Goal: to go home PT Goal Formulation: With patient Time For Goal Achievement: 12/18/19 Potential to Achieve Goals: Good Progress towards PT goals: Progressing toward goals    Frequency    7X/week      PT Plan Current plan remains appropriate    Co-evaluation              AM-PAC PT "6 Clicks" Mobility   Outcome Measure  Help needed turning from your back to your side while in a flat bed without using  bedrails?: None Help needed moving from lying on your back to sitting on the side of a flat bed without using bedrails?: None Help needed moving to and from a bed to a chair (including a wheelchair)?: A Little Help needed standing up from a chair using your arms (e.g., wheelchair or bedside chair)?: A Little Help needed to walk in hospital room?: A Little Help needed climbing 3-5 steps with a railing? : A Little 6 Click Score: 20    End of Session Equipment Utilized During Treatment: Gait belt Activity Tolerance: Patient tolerated treatment well Patient left: with call bell/phone within reach;in chair;with family/visitor present Nurse Communication: Mobility status PT Visit Diagnosis: Muscle weakness (generalized) (M62.81);Other abnormalities of gait and mobility (R26.89);Pain Pain - Right/Left: Right Pain - part of body: Knee     Time: [id number] PT Time Calculation (min) (ACUTE ONLY): 20 min  Charges:  $Gait Training: 8-22 mins                     Leighton Roach, PT  Acute Rehab Services  Pager [phone] Office [place] 12/05/2019, 3:16 PM

## 2019-12-05 NOTE — Progress Notes (Addendum)
Subjective: 1 Day Post-Op Procedure(s) (LRB): RIGHT TOTAL KNEE ARTHROPLASTY (Right) Patient reports pain as mild.    Objective: Vital signs in last 24 hours: Temp:  [97.5 F (36.4 C)-98.9 F (37.2 C)] 97.6 F (36.4 C) (05/04 0351) Pulse Rate:  [51-88] 69 (05/04 0351) Resp:  [8-17] 17 (05/04 0351) BP: (116-155)/(57-88) 131/67 (05/04 0351) SpO2:  [93 %-100 %] 95 % (05/04 0351) Weight:  [98.9 kg] 98.9 kg (05/03 0800)  Intake/Output from previous day: 05/03 0701 - 05/04 0700 In: 3050.1 [I.V.:2550.1; IV Piggyback:500] Out: 675 [Urine:625; Blood:50] Intake/Output this shift: No intake/output data recorded.  No results for input(s): HGB in the last 72 hours. No results for input(s): WBC, RBC, HCT, PLT in the last 72 hours. No results for input(s): NA, K, CL, CO2, BUN, CREATININE, GLUCOSE, CALCIUM in the last 72 hours. Recent Labs    12/04/19 0752  INR 1.1    Neurologically intact Neurovascular intact Sensation intact distally Intact pulses distally Dorsiflexion/Plantar flexion intact Incision: dressing C/D/I No cellulitis present Compartment soft   Assessment/Plan: 1 Day Post-Op Procedure(s) (LRB): RIGHT TOTAL KNEE ARTHROPLASTY (Right) Up with therapy Discharge home with home health after second PT session as long as he continues to mobilize WBAT RLE Awaiting am labs Please call office for d/c order/summary   Anticipated LOS equal to or greater than 2 midnights due to - Age 78 and older with one or more of the following:  - Obesity  - Expected need for hospital services (PT, OT, Nursing) required for safe  discharge  - Anticipated need for postoperative skilled nursing care or inpatient rehab  - Active co-morbidities: Coronary Artery Disease OR   - Unanticipated findings during/Post Surgery: Slow post-op progression: GI, pain control, mobility  - Patient is a high risk of re-admission due to: None    Aundra Dubin 12/05/2019, 7:50 AM

## 2019-12-08 ENCOUNTER — Telehealth: Payer: Self-pay | Admitting: Orthopaedic Surgery

## 2019-12-08 DIAGNOSIS — K559 Vascular disorder of intestine, unspecified: Secondary | ICD-10-CM | POA: Diagnosis not present

## 2019-12-08 DIAGNOSIS — F0390 Unspecified dementia without behavioral disturbance: Secondary | ICD-10-CM | POA: Diagnosis not present

## 2019-12-08 DIAGNOSIS — N320 Bladder-neck obstruction: Secondary | ICD-10-CM | POA: Diagnosis not present

## 2019-12-08 DIAGNOSIS — I4891 Unspecified atrial fibrillation: Secondary | ICD-10-CM | POA: Diagnosis not present

## 2019-12-08 DIAGNOSIS — Z87891 Personal history of nicotine dependence: Secondary | ICD-10-CM | POA: Diagnosis not present

## 2019-12-08 DIAGNOSIS — Z8546 Personal history of malignant neoplasm of prostate: Secondary | ICD-10-CM | POA: Diagnosis not present

## 2019-12-08 DIAGNOSIS — Z955 Presence of coronary angioplasty implant and graft: Secondary | ICD-10-CM | POA: Diagnosis not present

## 2019-12-08 DIAGNOSIS — E785 Hyperlipidemia, unspecified: Secondary | ICD-10-CM | POA: Diagnosis not present

## 2019-12-08 DIAGNOSIS — N200 Calculus of kidney: Secondary | ICD-10-CM | POA: Diagnosis not present

## 2019-12-08 DIAGNOSIS — K219 Gastro-esophageal reflux disease without esophagitis: Secondary | ICD-10-CM | POA: Diagnosis not present

## 2019-12-08 DIAGNOSIS — N529 Male erectile dysfunction, unspecified: Secondary | ICD-10-CM | POA: Diagnosis not present

## 2019-12-08 DIAGNOSIS — Z96653 Presence of artificial knee joint, bilateral: Secondary | ICD-10-CM | POA: Diagnosis not present

## 2019-12-08 DIAGNOSIS — Z471 Aftercare following joint replacement surgery: Secondary | ICD-10-CM | POA: Diagnosis not present

## 2019-12-08 DIAGNOSIS — I10 Essential (primary) hypertension: Secondary | ICD-10-CM | POA: Diagnosis not present

## 2019-12-08 DIAGNOSIS — D509 Iron deficiency anemia, unspecified: Secondary | ICD-10-CM | POA: Diagnosis not present

## 2019-12-08 DIAGNOSIS — K449 Diaphragmatic hernia without obstruction or gangrene: Secondary | ICD-10-CM | POA: Diagnosis not present

## 2019-12-08 DIAGNOSIS — I2510 Atherosclerotic heart disease of native coronary artery without angina pectoris: Secondary | ICD-10-CM | POA: Diagnosis not present

## 2019-12-08 DIAGNOSIS — K4090 Unilateral inguinal hernia, without obstruction or gangrene, not specified as recurrent: Secondary | ICD-10-CM | POA: Diagnosis not present

## 2019-12-08 DIAGNOSIS — K56609 Unspecified intestinal obstruction, unspecified as to partial versus complete obstruction: Secondary | ICD-10-CM | POA: Diagnosis not present

## 2019-12-08 DIAGNOSIS — Z7901 Long term (current) use of anticoagulants: Secondary | ICD-10-CM | POA: Diagnosis not present

## 2019-12-08 NOTE — Discharge Summary (Signed)
Patient ID: Jim Dodson MRN: [id number] DOB/AGE: 04/20/1941 79 y.o.  Admit date: 12/04/2019 Discharge date: 12/08/2019  Admission Diagnoses:  Primary osteoarthritis of right knee  Discharge Diagnoses:  Principal Problem:   Primary osteoarthritis of right knee Active Problems:   Status post total right knee replacement   Past Medical History:  Diagnosis Date  . A-fib   . Actinic keratosis   . Arthritis   . Atrial fibrillation ([place])   . Coronary artery disease    stent placed 2009  . Dementia ([place])    memory loss  . Headache   . History of hiatal hernia   . History of kidney stones    patient denies  . Hypertension   . Prostate cancer [facility])    prostate cancer, had sx  . Seborrheic dermatitis   . Shortness of breath dyspnea     Surgeries: Procedure(s): RIGHT TOTAL KNEE ARTHROPLASTY on 12/04/2019   Consultants (if any):   Discharged Condition: Improved  Hospital Course: Jim Dodson is an 79 y.o. male who was admitted 12/04/2019 with a diagnosis of Primary osteoarthritis of right knee and went to the operating room on 12/04/2019 and underwent the above named procedures.    He was given perioperative antibiotics:  Anti-infectives (From admission, onward)   Start     Dose/Rate Route Frequency Ordered Stop   12/04/19 1615  ceFAZolin (ANCEF) IVPB 2g/100 mL premix     2 g 200 mL/hr over 30 Minutes Intravenous Every 6 hours 12/04/19 1414 12/05/19 0422   12/04/19 1001  vancomycin (VANCOCIN) powder  Status:  Discontinued       As needed 12/04/19 1002 12/04/19 1224   12/04/19 0800  ceFAZolin (ANCEF) IVPB 2g/100 mL premix     2 g 200 mL/hr over 30 Minutes Intravenous On call to O.R. 12/04/19 0751 12/04/19 1015   12/04/19 0758  ceFAZolin (ANCEF) 2-4 GM/100ML-% IVPB    Note to Pharmacy: Jim Dodson   : cabinet override      12/04/19 0758 12/04/19 1016    .  He was given sequential compression devices, early ambulation, and appropriate chemoprophylaxis for DVT  prophylaxis.  He benefited maximally from the hospital stay and there were no complications.    Recent vital signs:  Vitals:   12/05/19 0817 12/05/19 1232  BP: (!) 144/76 124/68  Pulse: 65 66  Resp: 16 17  Temp: 98.6 F (37 C) 98.1 F (36.7 C)  SpO2: 96% 96%    Recent laboratory studies:  Lab Results  Component Value Date   HGB 10.6 (L) 12/05/2019   HGB 12.6 (L) 11/30/2019   HGB 10.2 (L) 04/09/2016   Lab Results  Component Value Date   WBC 8.9 12/05/2019   PLT 131 (L) 12/05/2019   Lab Results  Component Value Date   INR 1.1 12/04/2019   Lab Results  Component Value Date   NA 137 12/05/2019   K 3.4 (L) 12/05/2019   CL 102 12/05/2019   CO2 26 12/05/2019   BUN 16 12/05/2019   CREATININE 1.23 12/05/2019   GLUCOSE 151 (H) 12/05/2019    Discharge Medications:   Allergies as of 12/05/2019      Reactions   Statins Swelling, Other (See Comments)   Legs swelled up and cramped up really bad      Medication List    STOP taking these medications   acetaminophen 500 MG tablet Commonly known as: TYLENOL     TAKE these medications   Anti-Diarrheal  2 MG capsule Generic drug: loperamide Take 2 mg by mouth daily as needed for diarrhea or loose stools. Notes to patient: Resume home regimen   docusate sodium 100 MG capsule Commonly known as: Colace Take 1 capsule (100 mg total) by mouth daily as needed. Notes to patient: Last dose given 05/04 08:23   Eliquis 5 MG Tabs tablet Generic drug: apixaban Take 5 mg by mouth 2 (two) times daily.   ferrous sulfate 325 (65 FE) MG tablet Take 325 mg by mouth in the morning and at bedtime. Notes to patient: Resume home regimen   Fish Oil 1200 MG Caps Take 1,200 mg by mouth in the morning, at noon, and at bedtime. Notes to patient: Resume home regimen   hydrALAZINE 25 MG tablet Commonly known as: APRESOLINE Take 25 mg by mouth 3 (three) times daily.   hydrALAZINE 50 MG tablet Commonly known as: APRESOLINE Take 1  tablet (50 mg total) by mouth every 8 (eight) hours.   hydrochlorothiazide 25 MG tablet Commonly known as: HYDRODIURIL Take 25 mg by mouth daily.   hydrocortisone 2.5 % lotion Apply 1 application topically every Monday, Wednesday, and Friday. At night Notes to patient: Resume home regimen   ketoconazole 2 % cream Commonly known as: NIZORAL Apply 1 application topically 3 (three) times a week. Tues, Thurs, Sat at night Notes to patient: Resume home regimen   loratadine 10 MG tablet Commonly known as: CLARITIN Take 10 mg by mouth daily. Notes to patient: Resume home regimen   meclizine 12.5 MG tablet Commonly known as: ANTIVERT Take 12.5 mg by mouth daily.   memantine 10 MG tablet Commonly known as: NAMENDA Take 10 mg by mouth 2 (two) times daily.   methocarbamol 500 MG tablet Commonly known as: Robaxin Take 1 tablet (500 mg total) by mouth 2 (two) times daily as needed.   metoprolol tartrate 100 MG tablet Commonly known as: LOPRESSOR Take 1 tablet (100 mg total) by mouth 2 (two) times daily.   mirabegron ER 50 MG Tb24 tablet Commonly known as: MYRBETRIQ Take 1 tablet (50 mg total) by mouth daily.   omeprazole 20 MG capsule Commonly known as: PRILOSEC Take 20 mg by mouth daily.   ondansetron 4 MG tablet Commonly known as: Zofran Take 1 tablet (4 mg total) by mouth every 8 (eight) hours as needed for nausea or vomiting.   OVER THE COUNTER MEDICATION Take 1 capsule by mouth in the morning, at noon, and at bedtime. Cissus XT otc supplement   oxyCODONE 5 MG immediate release tablet Commonly known as: Roxicodone Take 1-2 tabs po every 6-8 hours prn pain   oxyCODONE 10 mg 12 hr tablet Commonly known as: OXYCONTIN Take 1 tablet (10 mg total) by mouth every 12 (twelve) hours.   Red Yeast Rice Extract 600 MG Caps Take 600 mg by mouth 2 (two) times daily. Notes to patient: Resume home regimen   REFRESH OPTIVE PF OP Place 1 drop into both eyes in the morning and at  bedtime. Notes to patient: Resume home regimen   tamsulosin 0.4 MG Caps capsule Commonly known as: FLOMAX Take 1 capsule (0.4 mg total) by mouth 2 (two) times a day.   Vitamin D3 125 MCG (5000 UT) Tabs Take 5,000 Units by mouth daily. Notes to patient: Resume home regimen       Diagnostic Studies: DG Chest 2 View  Result Date: 11/30/2019 CLINICAL DATA:  Preop exam.  Knee replacement EXAM: CHEST - 2 VIEW COMPARISON:  07/25/2015  FINDINGS: Normal cardiac silhouette with ectatic aorta. No effusion, infiltrate or pneumothorax. No acute osseous abnormality. IMPRESSION: No acute cardiopulmonary process. Electronically Signed   By: Suzy Bouchard M.D.   On: 11/30/2019 15:41   DG Knee Right Port  Result Date: 12/04/2019 CLINICAL DATA:  Postop. EXAM: PORTABLE RIGHT KNEE - 1-2 VIEW COMPARISON:  August 16, 2019 FINDINGS: The patient has undergone total knee arthroplasty on the right. The alignment appears near anatomic. There are expected postsurgical changes. The hardware appears intact. IMPRESSION: Expected postsurgical changes related to total knee arthroplasty on the right. Electronically Signed   By: Constance Holster M.D.   On: 12/04/2019 15:03    Disposition: Discharge disposition: 01-Home or Self Care       Discharge Instructions    Call MD / Call 911   Complete by: As directed    If you experience chest pain or shortness of breath, CALL 911 and be transported to the hospital emergency room.  If you develope a fever above 101.5 F, pus (white drainage) or increased drainage or redness at the wound, or calf pain, call your surgeon's office.   Constipation Prevention   Complete by: As directed    Drink plenty of fluids.  Prune juice may be helpful.  You may use a stool softener, such as Colace (over the counter) 100 mg twice a day.  Use MiraLax (over the counter) for constipation as needed.   Driving restrictions   Complete by: As directed    No driving while taking narcotic pain  meds.   Increase activity slowly as tolerated   Complete by: As directed       Follow-up Information    Leandrew Koyanagi, MD. Schedule an appointment as soon as possible for a visit in 2 week(s).   Specialty: Orthopedic Surgery Contact information: [place] [address] [phone]        [facility] Follow up.   Specialty: Amelia Why: home health services arranged Contact information: [address] [place] [address] [phone]            Signed: Eduard Roux 12/08/2019, 2:01 PM

## 2019-12-08 NOTE — Telephone Encounter (Signed)
Jim Dodson with [facility] called requesting VO's for the following:  1x week for 1 week 1x a week for 3 weeks 1x a week for 2 weeks  CB#[phone].  Thank you.

## 2019-12-11 ENCOUNTER — Telehealth: Payer: Self-pay | Admitting: Orthopaedic Surgery

## 2019-12-11 NOTE — Telephone Encounter (Signed)
Patient called.   They need orders for the patient to start PT at [facility] after the patient ends home PT.   Jefm Bryant Fax [phone]  Patient's wife: [phone]

## 2019-12-11 NOTE — Telephone Encounter (Signed)
Called to approve orders. No answer LMOM approving orders.

## 2019-12-13 ENCOUNTER — Telehealth: Payer: Self-pay | Admitting: Orthopaedic Surgery

## 2019-12-13 ENCOUNTER — Telehealth: Payer: Self-pay

## 2019-12-13 ENCOUNTER — Other Ambulatory Visit: Payer: Self-pay

## 2019-12-13 DIAGNOSIS — M1711 Unilateral primary osteoarthritis, right knee: Secondary | ICD-10-CM

## 2019-12-13 NOTE — Telephone Encounter (Signed)
Patient's wife [name] called requesting referral be sent for patient to physical therapy at [facility] in [place]. Mrs. Sherian said Dr. Erlinda Hong was to have faxed prescription for PT. Fax number is [phone].

## 2019-12-13 NOTE — Telephone Encounter (Signed)
Faxed PT form. Patient aware.

## 2019-12-13 NOTE — Telephone Encounter (Signed)
He does not need to

## 2019-12-13 NOTE — Telephone Encounter (Signed)
faxed

## 2019-12-13 NOTE — Telephone Encounter (Signed)
Patient would like to know if he needs to wear his compression stockings when he comes in for his appointment on Tuesday, 5/18?  Cb# [phone].  Please advise.  Thank you.

## 2019-12-14 ENCOUNTER — Telehealth: Payer: Self-pay

## 2019-12-14 NOTE — Telephone Encounter (Signed)
Caryl Pina with [facility] called stating that referral for PT needs to be faxed manually. Fax# [phone].  Cb# [phone].  Please advise.  Thank you.

## 2019-12-15 NOTE — Telephone Encounter (Signed)
FAXED

## 2019-12-18 DIAGNOSIS — E785 Hyperlipidemia, unspecified: Secondary | ICD-10-CM | POA: Diagnosis not present

## 2019-12-18 DIAGNOSIS — K4090 Unilateral inguinal hernia, without obstruction or gangrene, not specified as recurrent: Secondary | ICD-10-CM | POA: Diagnosis not present

## 2019-12-18 DIAGNOSIS — Z471 Aftercare following joint replacement surgery: Secondary | ICD-10-CM | POA: Diagnosis not present

## 2019-12-18 DIAGNOSIS — I4891 Unspecified atrial fibrillation: Secondary | ICD-10-CM | POA: Diagnosis not present

## 2019-12-18 DIAGNOSIS — K449 Diaphragmatic hernia without obstruction or gangrene: Secondary | ICD-10-CM | POA: Diagnosis not present

## 2019-12-18 DIAGNOSIS — I2510 Atherosclerotic heart disease of native coronary artery without angina pectoris: Secondary | ICD-10-CM | POA: Diagnosis not present

## 2019-12-18 DIAGNOSIS — Z87891 Personal history of nicotine dependence: Secondary | ICD-10-CM | POA: Diagnosis not present

## 2019-12-18 DIAGNOSIS — Z955 Presence of coronary angioplasty implant and graft: Secondary | ICD-10-CM | POA: Diagnosis not present

## 2019-12-18 DIAGNOSIS — I10 Essential (primary) hypertension: Secondary | ICD-10-CM | POA: Diagnosis not present

## 2019-12-18 DIAGNOSIS — D509 Iron deficiency anemia, unspecified: Secondary | ICD-10-CM | POA: Diagnosis not present

## 2019-12-18 DIAGNOSIS — Z7901 Long term (current) use of anticoagulants: Secondary | ICD-10-CM | POA: Diagnosis not present

## 2019-12-18 DIAGNOSIS — N320 Bladder-neck obstruction: Secondary | ICD-10-CM | POA: Diagnosis not present

## 2019-12-18 DIAGNOSIS — Z8546 Personal history of malignant neoplasm of prostate: Secondary | ICD-10-CM | POA: Diagnosis not present

## 2019-12-18 DIAGNOSIS — K219 Gastro-esophageal reflux disease without esophagitis: Secondary | ICD-10-CM | POA: Diagnosis not present

## 2019-12-18 DIAGNOSIS — N529 Male erectile dysfunction, unspecified: Secondary | ICD-10-CM | POA: Diagnosis not present

## 2019-12-18 DIAGNOSIS — N200 Calculus of kidney: Secondary | ICD-10-CM | POA: Diagnosis not present

## 2019-12-18 DIAGNOSIS — K559 Vascular disorder of intestine, unspecified: Secondary | ICD-10-CM | POA: Diagnosis not present

## 2019-12-18 DIAGNOSIS — F0390 Unspecified dementia without behavioral disturbance: Secondary | ICD-10-CM | POA: Diagnosis not present

## 2019-12-18 DIAGNOSIS — K56609 Unspecified intestinal obstruction, unspecified as to partial versus complete obstruction: Secondary | ICD-10-CM | POA: Diagnosis not present

## 2019-12-18 DIAGNOSIS — Z96653 Presence of artificial knee joint, bilateral: Secondary | ICD-10-CM | POA: Diagnosis not present

## 2019-12-19 ENCOUNTER — Encounter: Payer: Self-pay | Admitting: Orthopaedic Surgery

## 2019-12-19 ENCOUNTER — Other Ambulatory Visit: Payer: Self-pay

## 2019-12-19 ENCOUNTER — Ambulatory Visit (INDEPENDENT_AMBULATORY_CARE_PROVIDER_SITE_OTHER): Payer: PPO | Admitting: Orthopaedic Surgery

## 2019-12-19 DIAGNOSIS — Z96651 Presence of right artificial knee joint: Secondary | ICD-10-CM

## 2019-12-19 NOTE — Progress Notes (Signed)
Post-Op Visit Note   Patient: Jim Dodson           Date of Birth: March 18, 1941           MRN: [id number] Visit Date: 12/19/2019 PCP: Shon Baton, MD   Assessment & Plan:  Chief Complaint:  Chief Complaint  Patient presents with  . Right Knee - Routine Post Op   Visit Diagnoses:  1. Status post total right knee replacement     Plan: Jim Dodson is 2 weeks status post right total knee replacement.  He comes in with his wife today.  He is doing really well.  Not really taking pain medications on a regular basis.  Surgical incision is healed.  No signs of infection.  He can already get more than 90 degrees of flexion.  Mild swelling.  He he is on Eliquis at baseline.  I recommend TED hose for full 6 weeks during the day.  He has outpatient PT set up for the this Friday at [facility].  We will recheck him in 4 weeks with two-view x-rays of the right knee.  Follow-Up Instructions: Return in about 4 weeks (around 01/16/2020).   Orders:  No orders of the defined types were placed in this encounter.  No orders of the defined types were placed in this encounter.   Imaging: No results found.  PMFS History: Patient Active Problem List   Diagnosis Date Noted  . Status post total right knee replacement 12/04/2019  . Primary osteoarthritis of right knee 08/16/2019  . Iron deficiency anemia, unspecified 02/09/2019  . Ischemic colitis ([place]) 02/09/2019  . Hyperlipidemia 04/20/2018  . Total knee replacement status 04/08/2016  . Abnormal finding of diagnostic imaging 02/28/2016  . Sepsis ([place]) 07/25/2015  . On continuous oral anticoagulation 07/25/2015  . Essential hypertension 07/25/2015  . GERD (gastroesophageal reflux disease) 07/25/2015  . Acute upper respiratory infection 07/25/2015  . Bladder outlet obstruction 05/28/2015  . Incarcerated ventral hernia 03/14/2015  . Atrial fibrillation with rapid ventricular response ([place])   . Chronic coronary artery disease   . Elevated  troponin   . Patient receiving intravenous heparin therapy   . SBO (small bowel obstruction) ([place]) 03/08/2015  . Male urinary stress incontinence 08/17/2012  . ED (erectile dysfunction) of organic origin 03/30/2012  . Incomplete emptying of bladder 03/30/2012  . Inguinal hernia 03/30/2012  . Kidney stone 03/30/2012  . Malignant neoplasm of prostate ([place]) 03/30/2012   Past Medical History:  Diagnosis Date  . A-fib   . Actinic keratosis   . Arthritis   . Atrial fibrillation ([place])   . Coronary artery disease    stent placed 2009  . Dementia ([place])    memory loss  . Headache   . History of hiatal hernia   . History of kidney stones    patient denies  . Hypertension   . Prostate cancer [facility])    prostate cancer, had sx  . Seborrheic dermatitis   . Shortness of breath dyspnea     History reviewed. No pertinent family history.  Past Surgical History:  Procedure Laterality Date  . CARDIAC CATHETERIZATION     2009, stent placed  . CARDIOVERSION N/A 03/13/2015   Procedure: CARDIOVERSION;  Surgeon: Jerline Pain, MD;  Location: [place];  Service: Cardiovascular;  Laterality: N/A;  . COLONOSCOPY WITH PROPOFOL N/A 07/19/2015   Procedure: COLONOSCOPY WITH PROPOFOL;  Surgeon: Manya Silvas, MD;  Location: [facility] ENDOSCOPY;  Service: Endoscopy;  Laterality: N/A;  . COLONOSCOPY  WITH PROPOFOL N/A 01/27/2019   Procedure: COLONOSCOPY WITH PROPOFOL;  Surgeon: Manya Silvas, MD;  Location: [facility] ENDOSCOPY;  Service: Endoscopy;  Laterality: N/A;  . cryo surgery of prostate    . ESOPHAGOGASTRODUODENOSCOPY N/A 01/27/2019   Procedure: ESOPHAGOGASTRODUODENOSCOPY (EGD);  Surgeon: Manya Silvas, MD;  Location: [facility] ENDOSCOPY;  Service: Endoscopy;  Laterality: N/A;  . ESOPHAGOGASTRODUODENOSCOPY (EGD) WITH PROPOFOL N/A 07/19/2015   Procedure: ESOPHAGOGASTRODUODENOSCOPY (EGD) WITH PROPOFOL;  Surgeon: Manya Silvas, MD;  Location: [facility] ENDOSCOPY;  Service: Endoscopy;  Laterality: N/A;  .  LAPAROTOMY N/A 03/08/2015   Procedure: EXPLORATORY LAPAROTOMY;  Surgeon: Autumn Messing III, MD;  Location: [place];  Service: General;  Laterality: N/A;  . LAPAROTOMY N/A 03/09/2015   Procedure: EXPLORATORY LAPAROTOMY AND EVACUATION OF HEMATOMA;  Surgeon: Autumn Messing III, MD;  Location: [place];  Service: General;  Laterality: N/A;  . LYSIS OF ADHESION N/A 03/08/2015   Procedure: LYSIS OF ADHESION;  Surgeon: Autumn Messing III, MD;  Location: [place];  Service: General;  Laterality: N/A;  . OMENTECTOMY N/A 03/08/2015   Procedure: OMENTECTOMY;  Surgeon: Autumn Messing III, MD;  Location: [place];  Service: General;  Laterality: N/A;  . TEE WITHOUT CARDIOVERSION N/A 03/13/2015   Procedure: TRANSESOPHAGEAL ECHOCARDIOGRAM (TEE);  Surgeon: Jerline Pain, MD;  Location: [place];  Service: Cardiovascular;  Laterality: N/A;  . TOTAL KNEE ARTHROPLASTY Left 04/08/2016   Procedure: LEFT TOTAL KNEE ARTHROPLASTY;  Surgeon: Leandrew Koyanagi, MD;  Location: [place];  Service: Orthopedics;  Laterality: Left;  . TOTAL KNEE ARTHROPLASTY Right 12/04/2019   Procedure: RIGHT TOTAL KNEE ARTHROPLASTY;  Surgeon: Leandrew Koyanagi, MD;  Location: [place];  Service: Orthopedics;  Laterality: Right;  . UMBILICAL HERNIA REPAIR  03/08/2015   Procedure: UMBILICAL HERNIA REPAIR  ADULT;  Surgeon: Autumn Messing III, MD;  Location: [place];  Service: General;;  . VENTRAL HERNIA REPAIR N/A 03/08/2015   Procedure:  VENTRAL HERNIA  REPAIR ADULT;  Surgeon: Autumn Messing III, MD;  Location: [place];  Service: General;  Laterality: N/A;   Social History   Occupational History  . Not on file  Tobacco Use  . Smoking status: Former Smoker    Years: 20.00    Types: Cigarettes    Quit date: 08/03/1988    Years since quitting: 31.3  . Smokeless tobacco: Never Used  Substance and Sexual Activity  . Alcohol use: No  . Drug use: No  . Sexual activity: Yes

## 2019-12-22 DIAGNOSIS — Z96651 Presence of right artificial knee joint: Secondary | ICD-10-CM | POA: Diagnosis not present

## 2019-12-22 DIAGNOSIS — M25561 Pain in right knee: Secondary | ICD-10-CM | POA: Diagnosis not present

## 2019-12-26 DIAGNOSIS — Z96651 Presence of right artificial knee joint: Secondary | ICD-10-CM | POA: Diagnosis not present

## 2019-12-26 DIAGNOSIS — M25561 Pain in right knee: Secondary | ICD-10-CM | POA: Diagnosis not present

## 2019-12-28 DIAGNOSIS — Z96651 Presence of right artificial knee joint: Secondary | ICD-10-CM | POA: Diagnosis not present

## 2019-12-28 DIAGNOSIS — M25561 Pain in right knee: Secondary | ICD-10-CM | POA: Diagnosis not present

## 2020-01-02 DIAGNOSIS — M25561 Pain in right knee: Secondary | ICD-10-CM | POA: Diagnosis not present

## 2020-01-02 DIAGNOSIS — Z96651 Presence of right artificial knee joint: Secondary | ICD-10-CM | POA: Diagnosis not present

## 2020-01-03 DIAGNOSIS — K219 Gastro-esophageal reflux disease without esophagitis: Secondary | ICD-10-CM | POA: Diagnosis not present

## 2020-01-03 DIAGNOSIS — D509 Iron deficiency anemia, unspecified: Secondary | ICD-10-CM | POA: Diagnosis not present

## 2020-01-03 DIAGNOSIS — K559 Vascular disorder of intestine, unspecified: Secondary | ICD-10-CM | POA: Diagnosis not present

## 2020-01-04 DIAGNOSIS — M25561 Pain in right knee: Secondary | ICD-10-CM | POA: Diagnosis not present

## 2020-01-04 DIAGNOSIS — Z96651 Presence of right artificial knee joint: Secondary | ICD-10-CM | POA: Diagnosis not present

## 2020-01-08 DIAGNOSIS — E7849 Other hyperlipidemia: Secondary | ICD-10-CM | POA: Diagnosis not present

## 2020-01-08 DIAGNOSIS — Z125 Encounter for screening for malignant neoplasm of prostate: Secondary | ICD-10-CM | POA: Diagnosis not present

## 2020-01-08 DIAGNOSIS — E559 Vitamin D deficiency, unspecified: Secondary | ICD-10-CM | POA: Diagnosis not present

## 2020-01-08 DIAGNOSIS — R946 Abnormal results of thyroid function studies: Secondary | ICD-10-CM | POA: Diagnosis not present

## 2020-01-09 DIAGNOSIS — Z96651 Presence of right artificial knee joint: Secondary | ICD-10-CM | POA: Diagnosis not present

## 2020-01-09 DIAGNOSIS — M25561 Pain in right knee: Secondary | ICD-10-CM | POA: Diagnosis not present

## 2020-01-11 DIAGNOSIS — Z96651 Presence of right artificial knee joint: Secondary | ICD-10-CM | POA: Diagnosis not present

## 2020-01-11 DIAGNOSIS — M25561 Pain in right knee: Secondary | ICD-10-CM | POA: Diagnosis not present

## 2020-01-15 DIAGNOSIS — R739 Hyperglycemia, unspecified: Secondary | ICD-10-CM | POA: Diagnosis not present

## 2020-01-15 DIAGNOSIS — R399 Unspecified symptoms and signs involving the genitourinary system: Secondary | ICD-10-CM | POA: Diagnosis not present

## 2020-01-15 DIAGNOSIS — D696 Thrombocytopenia, unspecified: Secondary | ICD-10-CM | POA: Diagnosis not present

## 2020-01-15 DIAGNOSIS — K921 Melena: Secondary | ICD-10-CM | POA: Diagnosis not present

## 2020-01-15 DIAGNOSIS — R454 Irritability and anger: Secondary | ICD-10-CM | POA: Diagnosis not present

## 2020-01-15 DIAGNOSIS — I110 Hypertensive heart disease with heart failure: Secondary | ICD-10-CM | POA: Diagnosis not present

## 2020-01-15 DIAGNOSIS — M1990 Unspecified osteoarthritis, unspecified site: Secondary | ICD-10-CM | POA: Diagnosis not present

## 2020-01-15 DIAGNOSIS — I5022 Chronic systolic (congestive) heart failure: Secondary | ICD-10-CM | POA: Diagnosis not present

## 2020-01-15 DIAGNOSIS — R413 Other amnesia: Secondary | ICD-10-CM | POA: Diagnosis not present

## 2020-01-15 DIAGNOSIS — R82998 Other abnormal findings in urine: Secondary | ICD-10-CM | POA: Diagnosis not present

## 2020-01-15 DIAGNOSIS — Z8616 Personal history of COVID-19: Secondary | ICD-10-CM | POA: Diagnosis not present

## 2020-01-15 DIAGNOSIS — K148 Other diseases of tongue: Secondary | ICD-10-CM | POA: Diagnosis not present

## 2020-01-15 DIAGNOSIS — Z0000 Encounter for general adult medical examination without abnormal findings: Secondary | ICD-10-CM | POA: Diagnosis not present

## 2020-01-15 DIAGNOSIS — Z1331 Encounter for screening for depression: Secondary | ICD-10-CM | POA: Diagnosis not present

## 2020-01-15 DIAGNOSIS — R627 Adult failure to thrive: Secondary | ICD-10-CM | POA: Diagnosis not present

## 2020-01-15 DIAGNOSIS — D229 Melanocytic nevi, unspecified: Secondary | ICD-10-CM | POA: Diagnosis not present

## 2020-01-23 ENCOUNTER — Ambulatory Visit (INDEPENDENT_AMBULATORY_CARE_PROVIDER_SITE_OTHER): Payer: PPO | Admitting: Orthopaedic Surgery

## 2020-01-23 ENCOUNTER — Ambulatory Visit (INDEPENDENT_AMBULATORY_CARE_PROVIDER_SITE_OTHER): Payer: PPO

## 2020-01-23 ENCOUNTER — Telehealth: Payer: Self-pay | Admitting: Orthopaedic Surgery

## 2020-01-23 DIAGNOSIS — Z96651 Presence of right artificial knee joint: Secondary | ICD-10-CM | POA: Diagnosis not present

## 2020-01-23 MED ORDER — AMOXICILLIN 500 MG PO CAPS
2000.0000 mg | ORAL_CAPSULE | Freq: Once | ORAL | 6 refills | Status: DC
Start: 2020-01-23 — End: 2021-02-21

## 2020-01-23 NOTE — Progress Notes (Signed)
Post-Op Visit Note   Patient: Jim Dodson           Date of Birth: 24-Mar-1941           MRN: [id number] Visit Date: 01/23/2020 PCP: Shon Baton, MD   Assessment & Plan:  Chief Complaint: No chief complaint on file.  Visit Diagnoses:  1. Status post total right knee replacement     Plan: Mr. Vallery is 6 weeks status post right total knee replacement.  He has no complaints.  Ambulating with a cane.  He has completed outpatient PT.  X-rays demonstrate no complications.  He has excellent range of motion with minimal swelling. 6 week TKA follow up plan  Patient presents for follow up 6 weeks status post right total knee replacement. The wound is healed and there is no evidence of infection. TED hose may be discontinued. Radiographs reveal a total knee arthroplasty in good position, with no evidence of subsidence, loosening, or complicating features. Patient should refrain from any dental procedures, colonoscopies, etc until 3 months postop.  Reminders were given about signs to be aware of including redness, drainage, increased pain, fevers, calf pain, shortness of breath, or any concern should generate a phone call or a return to see [place] immediately. Will plan to follow up at 3 months postoperatively for next evaluation.   Follow-Up Instructions: Return in about 6 weeks (around 03/05/2020).   Orders:  Orders Placed This Encounter  Procedures  . XR Knee 1-2 Views Right   No orders of the defined types were placed in this encounter.   Imaging: XR Knee 1-2 Views Right  Result Date: 01/23/2020 Stable total knee replacement in good alignment    PMFS History: Patient Active Problem List   Diagnosis Date Noted  . Status post total right knee replacement 12/04/2019  . Primary osteoarthritis of right knee 08/16/2019  . Iron deficiency anemia, unspecified 02/09/2019  . Ischemic colitis ([place]) 02/09/2019  . Hyperlipidemia 04/20/2018  . Total knee replacement status 04/08/2016  .  Abnormal finding of diagnostic imaging 02/28/2016  . Sepsis ([place]) 07/25/2015  . On continuous oral anticoagulation 07/25/2015  . Essential hypertension 07/25/2015  . GERD (gastroesophageal reflux disease) 07/25/2015  . Acute upper respiratory infection 07/25/2015  . Bladder outlet obstruction 05/28/2015  . Incarcerated ventral hernia 03/14/2015  . Atrial fibrillation with rapid ventricular response ([place])   . Chronic coronary artery disease   . Elevated troponin   . Patient receiving intravenous heparin therapy   . SBO (small bowel obstruction) ([place]) 03/08/2015  . Male urinary stress incontinence 08/17/2012  . ED (erectile dysfunction) of organic origin 03/30/2012  . Incomplete emptying of bladder 03/30/2012  . Inguinal hernia 03/30/2012  . Kidney stone 03/30/2012  . Malignant neoplasm of prostate ([place]) 03/30/2012   Past Medical History:  Diagnosis Date  . A-fib   . Actinic keratosis   . Arthritis   . Atrial fibrillation ([place])   . Coronary artery disease    stent placed 2009  . Dementia ([place])    memory loss  . Headache   . History of hiatal hernia   . History of kidney stones    patient denies  . Hypertension   . Prostate cancer [facility])    prostate cancer, had sx  . Seborrheic dermatitis   . Shortness of breath dyspnea     No family history on file.  Past Surgical History:  Procedure Laterality Date  . CARDIAC CATHETERIZATION     2009, stent placed  .  CARDIOVERSION N/A 03/13/2015   Procedure: CARDIOVERSION;  Surgeon: Jerline Pain, MD;  Location: [place];  Service: Cardiovascular;  Laterality: N/A;  . COLONOSCOPY WITH PROPOFOL N/A 07/19/2015   Procedure: COLONOSCOPY WITH PROPOFOL;  Surgeon: Manya Silvas, MD;  Location[facility] ENDOSCOPY;  Service: Endoscopy;  Laterality: N/A;  . COLONOSCOPY WITH PROPOFOL N/A 01/27/2019   Procedure: COLONOSCOPY WITH PROPOFOL;  Surgeon: Manya Silvas, MD;  Location: [facility] ENDOSCOPY;  Service: Endoscopy;  Laterality: N/A;  . cryo  surgery of prostate    . ESOPHAGOGASTRODUODENOSCOPY N/A 01/27/2019   Procedure: ESOPHAGOGASTRODUODENOSCOPY (EGD);  Surgeon: Manya Silvas, MD;  Location: [facility] ENDOSCOPY;  Service: Endoscopy;  Laterality: N/A;  . ESOPHAGOGASTRODUODENOSCOPY (EGD) WITH PROPOFOL N/A 07/19/2015   Procedure: ESOPHAGOGASTRODUODENOSCOPY (EGD) WITH PROPOFOL;  Surgeon: Manya Silvas, MD;  Location: [facility] ENDOSCOPY;  Service: Endoscopy;  Laterality: N/A;  . LAPAROTOMY N/A 03/08/2015   Procedure: EXPLORATORY LAPAROTOMY;  Surgeon: Autumn Messing III, MD;  Location: [place];  Service: General;  Laterality: N/A;  . LAPAROTOMY N/A 03/09/2015   Procedure: EXPLORATORY LAPAROTOMY AND EVACUATION OF HEMATOMA;  Surgeon: Autumn Messing III, MD;  Location: [place];  Service: General;  Laterality: N/A;  . LYSIS OF ADHESION N/A 03/08/2015   Procedure: LYSIS OF ADHESION;  Surgeon: Autumn Messing III, MD;  Location: [place];  Service: General;  Laterality: N/A;  . OMENTECTOMY N/A 03/08/2015   Procedure: OMENTECTOMY;  Surgeon: Autumn Messing III, MD;  Location: [place];  Service: General;  Laterality: N/A;  . TEE WITHOUT CARDIOVERSION N/A 03/13/2015   Procedure: TRANSESOPHAGEAL ECHOCARDIOGRAM (TEE);  Surgeon: Jerline Pain, MD;  Location: [place];  Service: Cardiovascular;  Laterality: N/A;  . TOTAL KNEE ARTHROPLASTY Left 04/08/2016   Procedure: LEFT TOTAL KNEE ARTHROPLASTY;  Surgeon: Leandrew Koyanagi, MD;  Location: [place];  Service: Orthopedics;  Laterality: Left;  . TOTAL KNEE ARTHROPLASTY Right 12/04/2019   Procedure: RIGHT TOTAL KNEE ARTHROPLASTY;  Surgeon: Leandrew Koyanagi, MD;  Location: [place];  Service: Orthopedics;  Laterality: Right;  . UMBILICAL HERNIA REPAIR  03/08/2015   Procedure: UMBILICAL HERNIA REPAIR  ADULT;  Surgeon: Autumn Messing III, MD;  Location: [place];  Service: General;;  . VENTRAL HERNIA REPAIR N/A 03/08/2015   Procedure:  VENTRAL HERNIA  REPAIR ADULT;  Surgeon: Autumn Messing III, MD;  Location: [place];  Service: General;  Laterality: N/A;   Social History    Occupational History  . Not on file  Tobacco Use  . Smoking status: Former Smoker    Years: 20.00    Types: Cigarettes    Quit date: 08/03/1988    Years since quitting: 31.4  . Smokeless tobacco: Never Used  Vaping Use  . Vaping Use: Never used  Substance and Sexual Activity  . Alcohol use: No  . Drug use: No  . Sexual activity: Yes

## 2020-01-23 NOTE — Telephone Encounter (Signed)
Patient's wife called stating that the patient has a dental appointment next Tuesday and needs an antibiotic to be sent to [facility] in [place].  CB#[phone].  Thank you.

## 2020-01-25 ENCOUNTER — Telehealth: Payer: Self-pay | Admitting: Orthopaedic Surgery

## 2020-01-25 NOTE — Telephone Encounter (Signed)
Patient wife called stating there is no instructions of when medication is suppose to be take before patient's dentist appointment. Patient wife is asking for a call back with instructions of medication. Patient phone number is [phone].

## 2020-01-25 NOTE — Telephone Encounter (Signed)
I have advised.

## 2020-01-25 NOTE — Telephone Encounter (Signed)
Pls advise.  

## 2020-02-06 ENCOUNTER — Ambulatory Visit: Payer: PPO | Admitting: Orthopaedic Surgery

## 2020-02-06 ENCOUNTER — Other Ambulatory Visit: Payer: Self-pay | Admitting: Radiation Oncology

## 2020-02-09 DIAGNOSIS — H2513 Age-related nuclear cataract, bilateral: Secondary | ICD-10-CM | POA: Diagnosis not present

## 2020-02-12 IMAGING — MR MR PROSTATE WO/W CM
56 series · 56 of 56 positions shown · IV contrast (Multihance 20cc)
Comparison: CT abdomen/pelvis dated 03/08/2015

CLINICAL DATA: Elevated PSA, status post cryotherapy in 1667

EXAM:
MR PROSTATE WITHOUT AND WITH CONTRAST
TECHNIQUE: Multiplanar multisequence MRI images were obtained of the pelvis
centered about the prostate. Pre and post contrast images were
obtained.
CONTRAST:  20mL MULTIHANCE GADOBENATE DIMEGLUMINE 529 MG/ML IV SOLN
Creatinine was obtained on site at [HOSPITAL] at [HOSPITAL].
Results: Creatinine 1.2 mg/dL.

[Series 2: new loc · axial · 6.0mm · 0.88mm/px · 1 of 53 slices shown]
[im 1/53]
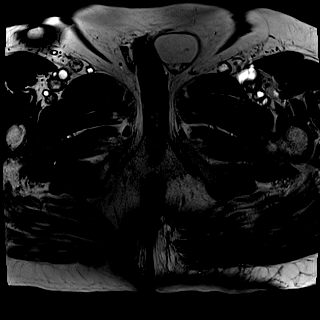

[Series 3: T1 · axial · 8.0mm · 1.06mm/px · 1 of 28 slices shown (1 of 2)]
[im 1/28]
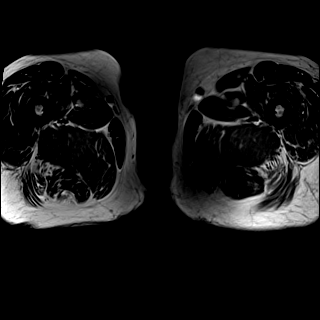

[Series 4: bSSFP fat-sat · axial · 8.0mm · 0.74mm/px · 1 of 28 slices shown]
[im 1/28]
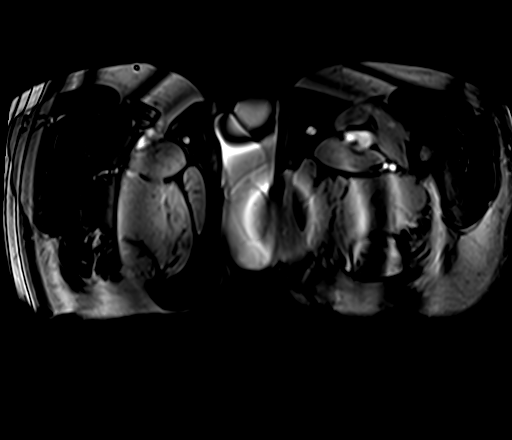

[Series 5: T2 · sagittal · 3.5mm · 0.56mm/px · 1 of 39 slices shown (1 of 4)]
[im 1/39]
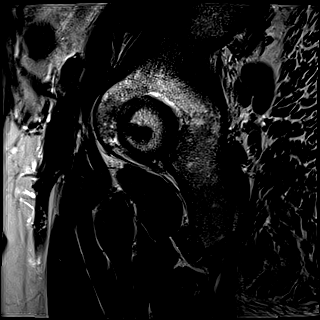

[Series 6: T1 · axial · 3.0mm · 0.31mm/px · 1 of 24 slices shown (2 of 2)]
[im 1/24]
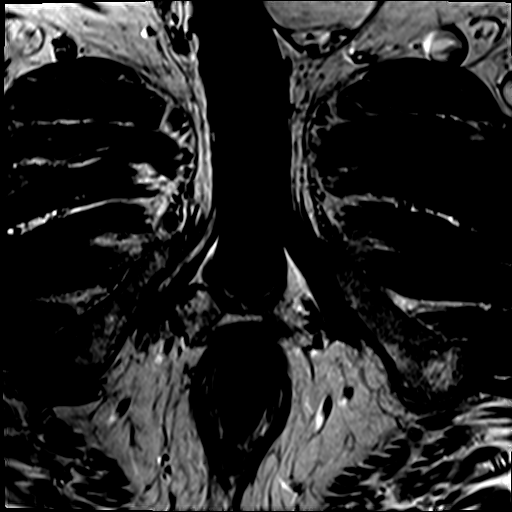

[Series 7: T2 · axial · 3.5mm · 0.56mm/px · 1 of 23 slices shown (2 of 4)]
[im 1/23]
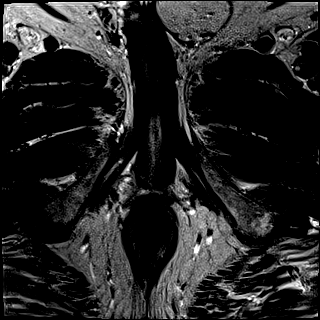

[Series 8: T2 · axial · 1.0mm · 1.04mm/px · 1 of 80 slices shown (3 of 4)]
[im 1/80]
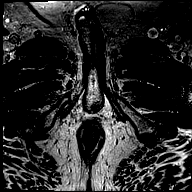

[Series 9: T2 · coronal · 3.5mm · 0.56mm/px · 1 of 23 slices shown (4 of 4)]
[im 1/23]
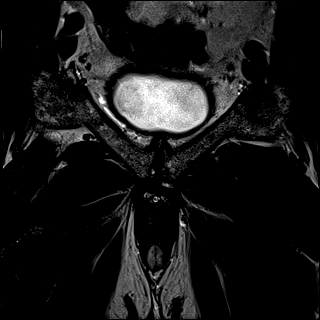

[Series 10: DWI · axial · 3.5mm · 1.56mm/px · 1 of 66 slices shown (1 of 2)]
[im 1/66]
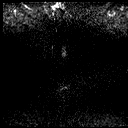

[Series 11: DWI · axial · 3.5mm · 1.56mm/px · 1 of 22 slices shown (2 of 2)]
[im 1/22]
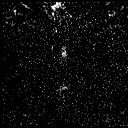

[Series 12: pre t1_twist_tra_dyn_ttc=5.8s · axial · non-contrast · 3.5mm · 0.83mm/px · 1 of 22 slices shown]
[im 1/22]
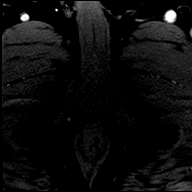

[Series 13: post t1_twist_tra_dyn-copy center · axial · 3.5mm · 0.83mm/px · 1 of 22 slices shown (1 of 23)]
[im 1/22]
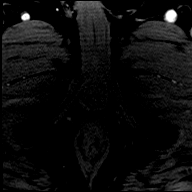

[Series 14: post t1_twist_tra_dyn-copy center · axial · 3.5mm · 0.83mm/px · 1 of 22 slices shown (2 of 23)]
[im 1/22]
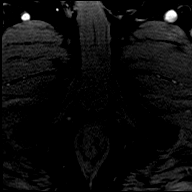

[Series 15: post t1_twist_tra_dyn-copy cent_sub_ttc=25.7s · axial · 3.5mm · 0.83mm/px · 1 of 20 slices shown]
[im 1/20]
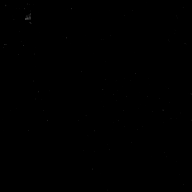

[Series 16: post t1_twist_tra_dyn-copy center · axial · 3.5mm · 0.83mm/px · 1 of 22 slices shown (3 of 23)]
[im 1/22]
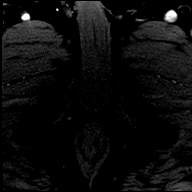

[Series 17: post t1_twist_tra_dyn-copy cent_sub_ttc=37.2s · axial · 3.5mm · 0.83mm/px · 1 of 21 slices shown]
[im 1/21]
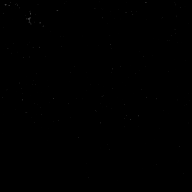

[Series 18: post t1_twist_tra_dyn-copy center · axial · 3.5mm · 0.83mm/px · 1 of 22 slices shown (4 of 23)]
[im 1/22]
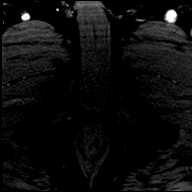

[Series 19: post t1_twist_tra_dyn-copy cent_sub_ttc=48.6s · axial · 3.5mm · 0.83mm/px · 1 of 22 slices shown]
[im 1/22]
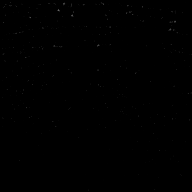

[Series 20: post t1_twist_tra_dyn-copy center · axial · 3.5mm · 0.83mm/px · 1 of 22 slices shown (5 of 23)]
[im 1/22]
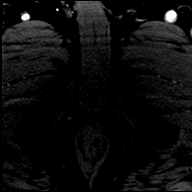

[Series 21: post t1_twist_tra_dyn-copy cent_sub_ttc=60.1s · axial · 3.5mm · 0.83mm/px · 1 of 22 slices shown]
[im 1/22]
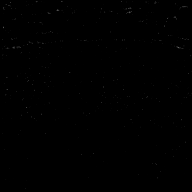

[Series 22: post t1_twist_tra_dyn-copy center · axial · 3.5mm · 0.83mm/px · 1 of 22 slices shown (6 of 23)]
[im 1/22]
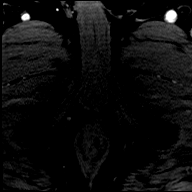

[Series 23: post t1_twist_tra_dyn-copy cent_sub_ttc=71.6s · axial · 3.5mm · 0.83mm/px · 1 of 22 slices shown]
[im 1/22]
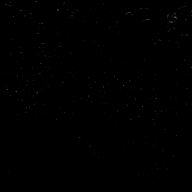

[Series 24: post t1_twist_tra_dyn-copy center · axial · 3.5mm · 0.83mm/px · 1 of 22 slices shown (7 of 23)]
[im 1/22]
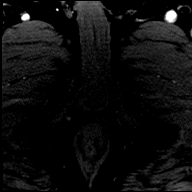

[Series 25: post t1_twist_tra_dyn-copy cent_sub_ttc=83.1s · axial · 3.5mm · 0.83mm/px · 1 of 22 slices shown]
[im 1/22]
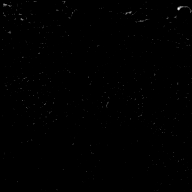

[Series 26: post t1_twist_tra_dyn-copy center · axial · 3.5mm · 0.83mm/px · 1 of 22 slices shown (8 of 23)]
[im 1/22]
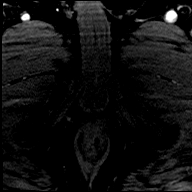

[Series 27: post t1_twist_tra_dyn-copy cent_sub_ttc=94.5s · axial · 3.5mm · 0.83mm/px · 1 of 22 slices shown]
[im 1/22]
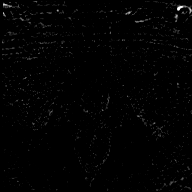

[Series 28: post t1_twist_tra_dyn-copy center · axial · 3.5mm · 0.83mm/px · 1 of 22 slices shown (9 of 23)]
[im 1/22]
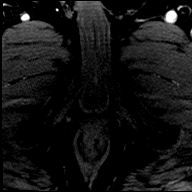

[Series 29: post t1_twist_tra_dyn-copy cent_sub_ttc=106.0s · axial · 3.5mm · 0.83mm/px · 1 of 22 slices shown]
[im 1/22]
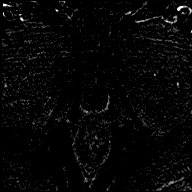

[Series 30: post t1_twist_tra_dyn-copy center · axial · 3.5mm · 0.83mm/px · 1 of 22 slices shown (10 of 23)]
[im 1/22]
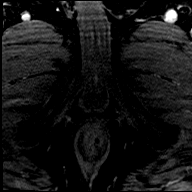

[Series 31: post t1_twist_tra_dyn-copy cent_sub_ttc=117.5s · axial · 3.5mm · 0.83mm/px · 1 of 22 slices shown]
[im 1/22]
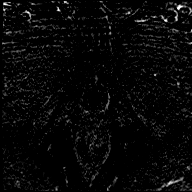

[Series 32: post t1_twist_tra_dyn-copy center · axial · 3.5mm · 0.83mm/px · 1 of 22 slices shown (11 of 23)]
[im 1/22]
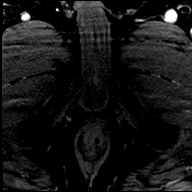

[Series 33: post t1_twist_tra_dyn-copy cent_sub_ttc=128.9s · axial · 3.5mm · 0.83mm/px · 1 of 22 slices shown]
[im 1/22]
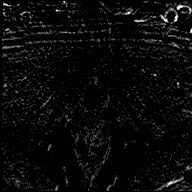

[Series 34: post t1_twist_tra_dyn-copy center · axial · 3.5mm · 0.83mm/px · 1 of 22 slices shown (12 of 23)]
[im 1/22]
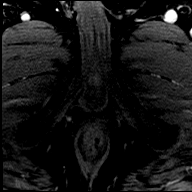

[Series 35: post t1_twist_tra_dyn-copy cent_sub_ttc=140.4s · axial · 3.5mm · 0.83mm/px · 1 of 22 slices shown]
[im 1/22]
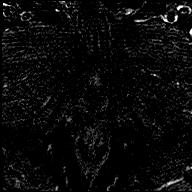

[Series 36: post t1_twist_tra_dyn-copy center · axial · 3.5mm · 0.83mm/px · 1 of 22 slices shown (13 of 23)]
[im 1/22]
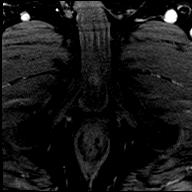

[Series 37: post t1_twist_tra_dyn-copy cent_sub_ttc=151.9s · axial · 3.5mm · 0.83mm/px · 1 of 22 slices shown]
[im 1/22]
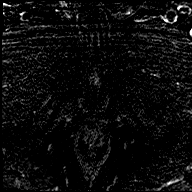

[Series 38: post t1_twist_tra_dyn-copy center · axial · 3.5mm · 0.83mm/px · 1 of 22 slices shown (14 of 23)]
[im 1/22]
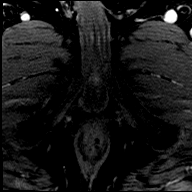

[Series 39: post t1_twist_tra_dyn-copy cent_sub_ttc=163.3s · axial · 3.5mm · 0.83mm/px · 1 of 22 slices shown]
[im 1/22]
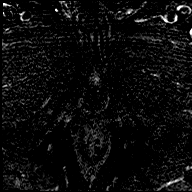

[Series 40: post t1_twist_tra_dyn-copy center · axial · 3.5mm · 0.83mm/px · 1 of 22 slices shown (15 of 23)]
[im 1/22]
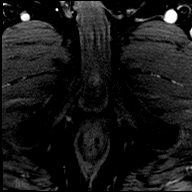

[Series 41: post t1_twist_tra_dyn-copy cent_sub_ttc=174.8s · axial · 3.5mm · 0.83mm/px · 1 of 22 slices shown]
[im 1/22]
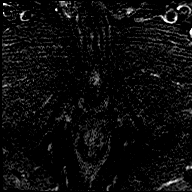

[Series 42: post t1_twist_tra_dyn-copy center · axial · 3.5mm · 0.83mm/px · 1 of 22 slices shown (16 of 23)]
[im 1/22]
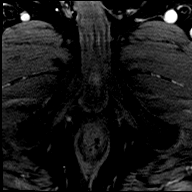

[Series 43: post t1_twist_tra_dyn-copy cent_sub_ttc=186.3s · axial · 3.5mm · 0.83mm/px · 1 of 22 slices shown]
[im 1/22]
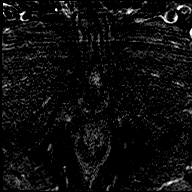

[Series 44: post t1_twist_tra_dyn-copy center · axial · 3.5mm · 0.83mm/px · 1 of 22 slices shown (17 of 23)]
[im 1/22]
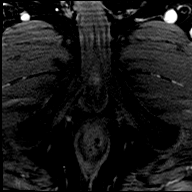

[Series 45: post t1_twist_tra_dyn-copy cent_sub_ttc=197.7s · axial · 3.5mm · 0.83mm/px · 1 of 22 slices shown]
[im 1/22]
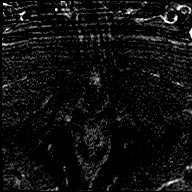

[Series 46: post t1_twist_tra_dyn-copy center · axial · 3.5mm · 0.83mm/px · 1 of 22 slices shown (18 of 23)]
[im 1/22]
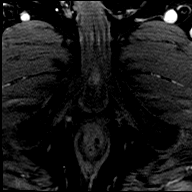

[Series 47: post t1_twist_tra_dyn-copy cent_sub_ttc=209.2s · axial · 3.5mm · 0.83mm/px · 1 of 22 slices shown]
[im 1/22]
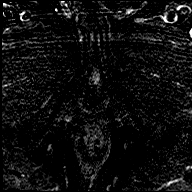

[Series 48: post t1_twist_tra_dyn-copy center · axial · 3.5mm · 0.83mm/px · 1 of 22 slices shown (19 of 23)]
[im 1/22]
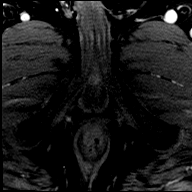

[Series 49: post t1_twist_tra_dyn-copy cent_sub_ttc=220.7s · axial · 3.5mm · 0.83mm/px · 1 of 22 slices shown]
[im 1/22]
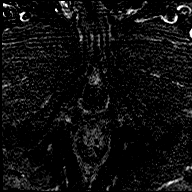

[Series 50: post t1_twist_tra_dyn-copy center · axial · 3.5mm · 0.83mm/px · 1 of 22 slices shown (20 of 23)]
[im 1/22]
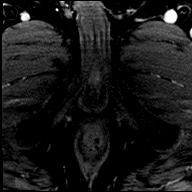

[Series 51: post t1_twist_tra_dyn-copy cent_sub_ttc=232.2s · axial · 3.5mm · 0.83mm/px · 1 of 22 slices shown]
[im 1/22]
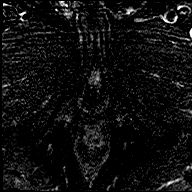

[Series 52: post t1_twist_tra_dyn-copy center · axial · 3.5mm · 0.83mm/px · 1 of 22 slices shown (21 of 23)]
[im 1/22]
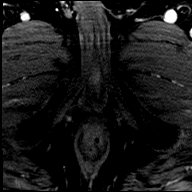

[Series 53: post t1_twist_tra_dyn-copy cent_sub_ttc=243.6s · axial · 3.5mm · 0.83mm/px · 1 of 22 slices shown]
[im 1/22]
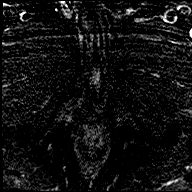

[Series 54: post t1_twist_tra_dyn-copy center · axial · 3.5mm · 0.83mm/px · 1 of 22 slices shown (22 of 23)]
[im 1/22]
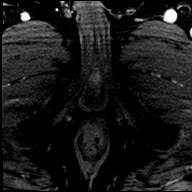

[Series 55: post t1_twist_tra_dyn-copy cent_sub_ttc=255.1s · axial · 3.5mm · 0.83mm/px · 1 of 22 slices shown]
[im 1/22]
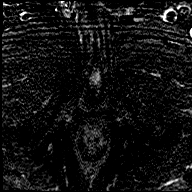

[Series 56: post t1_twist_tra_dyn-copy center · axial · 3.5mm · 0.83mm/px · 1 of 22 slices shown (23 of 23)]
[im 1/22]
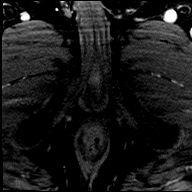

[Series 57: post t1_twist_tra_dyn-copy cent_sub_ttc=266.6s · axial · 3.5mm · 0.83mm/px · 1 of 22 slices shown]
[im 1/22]
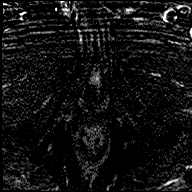

[56 of 56 positions shown; findings below may reference images not displayed]

FINDINGS: Prostate: Postprocedural changes with atrophy of the gland related
to cryotherapy. Normal peripheral and central gland zonal anatomy
cannot be distinguished on MRI.

[DATE] x 2.4 cm focus of enhancing soft tissue along the left
posterolateral apex (series 36/image 10), worrisome for residual
tumor given enhancement.

Volume: Approximately 1.6 x 2.6 x 2.4 cm (volume = 5.2 mL), although
poorly evaluated.

Transcapsular spread:  Not applicable in a postprocedural patient.

Seminal vesicle involvement: Abnormal soft tissue involving the base
of the left seminal vesicle (series 7/image 8). Fluid/hemorrhage in
the left seminal vesicle. Hemorrhage within the right seminal
vesicle, which is atrophied.

Neurovascular bundle involvement: Not applicable and a
postprocedural patient.

Pelvic adenopathy: Absent.

Bone metastasis: Absent.

Other findings: Mild bladder wall thickening. Moderate fat
containing left inguinal hernia.
IMPRESSION: Postprocedural changes with atrophy of the prostate status post
cryotherapy and disruption of the normal zonal anatomy.

1.4 x 2.4 cm focus of enhancing soft tissue along the left
posterolateral apex, worrisome for residual tumor, with suspected
involvement of the base of the left seminal vesicle.

No suspicious lymphadenopathy or osseous metastases.

## 2020-03-05 ENCOUNTER — Ambulatory Visit (INDEPENDENT_AMBULATORY_CARE_PROVIDER_SITE_OTHER): Payer: PPO | Admitting: Physician Assistant

## 2020-03-05 ENCOUNTER — Encounter: Payer: Self-pay | Admitting: Orthopaedic Surgery

## 2020-03-05 VITALS — Ht 64.5 in | Wt 217.6 lb

## 2020-03-05 DIAGNOSIS — Z96651 Presence of right artificial knee joint: Secondary | ICD-10-CM

## 2020-03-05 NOTE — Progress Notes (Signed)
Post-Op Visit Note   Patient: Jim Dodson           Date of Birth: 24-Aug-1940           MRN: [id number] Visit Date: 03/05/2020 PCP: Shon Baton, MD   Assessment & Plan:  Chief Complaint:  Chief Complaint  Patient presents with  . Right Knee - Pain   Visit Diagnoses:  1. Hx of total knee replacement, right     Plan: Patient is a pleasant 79 year old gentleman who comes in today for follow-up of his right total knee replacement 12/04/2019.  He has been doing fairly well.  He has regained a fair amount of range of motion, but he still has some residual weakness to his right quad.  He has stopped doing his home exercise program.  Minimal to no pain.  Examination of the right knee shows no effusion.  Range of motion from 0 to 110 degrees.  He has 3 out of 5 strength with resisted straight leg raise.  He is stable valgus varus stress.  He is neurovascular intact distally.  At this point, I have reinforced the need to continue with strengthening exercises.  He has agreed to do so.  Dental prophylaxis reinforced.  Follow-up with [place] in 3 months time for repeat evaluation and 2 view x-rays of the right knee.  Follow-Up Instructions: Return in about 3 months (around 06/05/2020).   Orders:  No orders of the defined types were placed in this encounter.  No orders of the defined types were placed in this encounter.   Imaging: No new imaging  PMFS History: Patient Active Problem List   Diagnosis Date Noted  . Status post total right knee replacement 12/04/2019  . Primary osteoarthritis of right knee 08/16/2019  . Iron deficiency anemia, unspecified 02/09/2019  . Ischemic colitis ([place]) 02/09/2019  . Hyperlipidemia 04/20/2018  . Total knee replacement status 04/08/2016  . Abnormal finding of diagnostic imaging 02/28/2016  . Sepsis ([place]) 07/25/2015  . On continuous oral anticoagulation 07/25/2015  . Essential hypertension 07/25/2015  . GERD (gastroesophageal reflux disease) 07/25/2015    . Acute upper respiratory infection 07/25/2015  . Bladder outlet obstruction 05/28/2015  . Incarcerated ventral hernia 03/14/2015  . Atrial fibrillation with rapid ventricular response ([place])   . Chronic coronary artery disease   . Elevated troponin   . Patient receiving intravenous heparin therapy   . SBO (small bowel obstruction) ([place]) 03/08/2015  . Male urinary stress incontinence 08/17/2012  . ED (erectile dysfunction) of organic origin 03/30/2012  . Incomplete emptying of bladder 03/30/2012  . Inguinal hernia 03/30/2012  . Kidney stone 03/30/2012  . Malignant neoplasm of prostate ([place]) 03/30/2012   Past Medical History:  Diagnosis Date  . A-fib   . Actinic keratosis   . Arthritis   . Atrial fibrillation ([place])   . Coronary artery disease    stent placed 2009  . Dementia ([place])    memory loss  . Headache   . History of hiatal hernia   . History of kidney stones    patient denies  . Hypertension   . Prostate cancer [facility])    prostate cancer, had sx  . Seborrheic dermatitis   . Shortness of breath dyspnea     History reviewed. No pertinent family history.  Past Surgical History:  Procedure Laterality Date  . CARDIAC CATHETERIZATION     2009, stent placed  . CARDIOVERSION N/A 03/13/2015   Procedure: CARDIOVERSION;  Surgeon: Jerline Pain, MD;  Location: [facility] ENDOSCOPY;  Service: Cardiovascular;  Laterality: N/A;  . COLONOSCOPY WITH PROPOFOL N/A 07/19/2015   Procedure: COLONOSCOPY WITH PROPOFOL;  Surgeon: Manya Silvas, MD;  Location: [facility] ENDOSCOPY;  Service: Endoscopy;  Laterality: N/A;  . COLONOSCOPY WITH PROPOFOL N/A 01/27/2019   Procedure: COLONOSCOPY WITH PROPOFOL;  Surgeon: Manya Silvas, MD;  Location: [facility] ENDOSCOPY;  Service: Endoscopy;  Laterality: N/A;  . cryo surgery of prostate    . ESOPHAGOGASTRODUODENOSCOPY N/A 01/27/2019   Procedure: ESOPHAGOGASTRODUODENOSCOPY (EGD);  Surgeon: Manya Silvas, MD;  Location: [facility] ENDOSCOPY;  Service: Endoscopy;   Laterality: N/A;  . ESOPHAGOGASTRODUODENOSCOPY (EGD) WITH PROPOFOL N/A 07/19/2015   Procedure: ESOPHAGOGASTRODUODENOSCOPY (EGD) WITH PROPOFOL;  Surgeon: Manya Silvas, MD;  Location: [facility] ENDOSCOPY;  Service: Endoscopy;  Laterality: N/A;  . LAPAROTOMY N/A 03/08/2015   Procedure: EXPLORATORY LAPAROTOMY;  Surgeon: Autumn Messing III, MD;  Location: [place];  Service: General;  Laterality: N/A;  . LAPAROTOMY N/A 03/09/2015   Procedure: EXPLORATORY LAPAROTOMY AND EVACUATION OF HEMATOMA;  Surgeon: Autumn Messing III, MD;  Location: [place];  Service: General;  Laterality: N/A;  . LYSIS OF ADHESION N/A 03/08/2015   Procedure: LYSIS OF ADHESION;  Surgeon: Autumn Messing III, MD;  Location: [place];  Service: General;  Laterality: N/A;  . OMENTECTOMY N/A 03/08/2015   Procedure: OMENTECTOMY;  Surgeon: Autumn Messing III, MD;  Location: [place];  Service: General;  Laterality: N/A;  . TEE WITHOUT CARDIOVERSION N/A 03/13/2015   Procedure: TRANSESOPHAGEAL ECHOCARDIOGRAM (TEE);  Surgeon: Jerline Pain, MD;  Location: [place];  Service: Cardiovascular;  Laterality: N/A;  . TOTAL KNEE ARTHROPLASTY Left 04/08/2016   Procedure: LEFT TOTAL KNEE ARTHROPLASTY;  Surgeon: Leandrew Koyanagi, MD;  Location: [place];  Service: Orthopedics;  Laterality: Left;  . TOTAL KNEE ARTHROPLASTY Right 12/04/2019   Procedure: RIGHT TOTAL KNEE ARTHROPLASTY;  Surgeon: Leandrew Koyanagi, MD;  Location: [place];  Service: Orthopedics;  Laterality: Right;  . UMBILICAL HERNIA REPAIR  03/08/2015   Procedure: UMBILICAL HERNIA REPAIR  ADULT;  Surgeon: Autumn Messing III, MD;  Location: [place];  Service: General;;  . VENTRAL HERNIA REPAIR N/A 03/08/2015   Procedure:  VENTRAL HERNIA  REPAIR ADULT;  Surgeon: Autumn Messing III, MD;  Location: [place];  Service: General;  Laterality: N/A;   Social History   Occupational History  . Not on file  Tobacco Use  . Smoking status: Former Smoker    Years: 20.00    Types: Cigarettes    Quit date: 08/03/1988    Years since quitting: 31.6  . Smokeless  tobacco: Never Used  Vaping Use  . Vaping Use: Never used  Substance and Sexual Activity  . Alcohol use: No  . Drug use: No  . Sexual activity: Yes

## 2020-04-10 DIAGNOSIS — I10 Essential (primary) hypertension: Secondary | ICD-10-CM | POA: Diagnosis not present

## 2020-04-10 DIAGNOSIS — E782 Mixed hyperlipidemia: Secondary | ICD-10-CM | POA: Diagnosis not present

## 2020-04-10 DIAGNOSIS — I25118 Atherosclerotic heart disease of native coronary artery with other forms of angina pectoris: Secondary | ICD-10-CM | POA: Diagnosis not present

## 2020-04-10 DIAGNOSIS — I480 Paroxysmal atrial fibrillation: Secondary | ICD-10-CM | POA: Diagnosis not present

## 2020-04-10 DIAGNOSIS — R600 Localized edema: Secondary | ICD-10-CM | POA: Diagnosis not present

## 2020-05-09 ENCOUNTER — Other Ambulatory Visit: Payer: Self-pay

## 2020-05-09 ENCOUNTER — Ambulatory Visit: Payer: PPO | Admitting: Dermatology

## 2020-05-09 DIAGNOSIS — L570 Actinic keratosis: Secondary | ICD-10-CM

## 2020-05-09 DIAGNOSIS — I8310 Varicose veins of unspecified lower extremity with inflammation: Secondary | ICD-10-CM | POA: Diagnosis not present

## 2020-05-09 DIAGNOSIS — L814 Other melanin hyperpigmentation: Secondary | ICD-10-CM

## 2020-05-09 DIAGNOSIS — D1800 Hemangioma unspecified site: Secondary | ICD-10-CM

## 2020-05-09 DIAGNOSIS — D229 Melanocytic nevi, unspecified: Secondary | ICD-10-CM | POA: Diagnosis not present

## 2020-05-09 DIAGNOSIS — Z1283 Encounter for screening for malignant neoplasm of skin: Secondary | ICD-10-CM

## 2020-05-09 DIAGNOSIS — L820 Inflamed seborrheic keratosis: Secondary | ICD-10-CM

## 2020-05-09 DIAGNOSIS — L578 Other skin changes due to chronic exposure to nonionizing radiation: Secondary | ICD-10-CM

## 2020-05-09 DIAGNOSIS — L219 Seborrheic dermatitis, unspecified: Secondary | ICD-10-CM | POA: Diagnosis not present

## 2020-05-09 DIAGNOSIS — L821 Other seborrheic keratosis: Secondary | ICD-10-CM

## 2020-05-09 NOTE — Progress Notes (Signed)
Follow-Up Visit   Subjective  Jim Dodson is a 79 y.o. male who presents for the following: Annual Exam (Total body skin exam, hx of AKs) and Seborrheic Dermatitis (face, HC 2.5% lotion 3d/wk, Ketoconazole 2% cr 3x/wk). The patient presents for Total-Body Skin Exam (TBSE) for skin cancer screening and mole check.  Patient accompanied by wife.  The following portions of the chart were reviewed this encounter and updated as appropriate:  Tobacco  Allergies  Meds  Problems  Med Hx  Surg Hx  Fam Hx     Review of Systems:  No other skin or systemic complaints except as noted in HPI or Assessment and Plan.  Objective  Well appearing patient in no apparent distress; mood and affect are within normal limits.  A full examination was performed including scalp, head, eyes, ears, nose, lips, neck, chest, axillae, abdomen, back, buttocks, bilateral upper extremities, bilateral lower extremities, hands, feet, fingers, toes, fingernails, and toenails. All findings within normal limits unless otherwise noted below.  Objective  face: Pinkness alar grooves   Objective  scalp/forehead x 17 (17): Pink scaly macules   Objective  L tricep x 1, R forearm x 1 (2): Erythematous keratotic or waxy stuck-on papule or plaque.    Assessment & Plan    Lentigines - Scattered tan macules - Discussed due to sun exposure - Benign, observe - Call for any changes  Seborrheic Keratoses - Stuck-on, waxy, tan-brown papules and plaques  - Discussed benign etiology and prognosis. - Observe - Call for any changes  Melanocytic Nevi - Tan-brown and/or pink-flesh-colored symmetric macules and papules - Benign appearing on exam today - Observation - Call clinic for new or changing moles - Recommend daily use of broad spectrum spf 30+ sunscreen to sun-exposed areas.   Hemangiomas - Red papules - Discussed benign nature - Observe - Call for any changes  Actinic Damage - diffuse scaly  erythematous macules with underlying dyspigmentation - Recommend daily broad spectrum sunscreen SPF 30+ to sun-exposed areas, reapply every 2 hours as needed.  - Call for new or changing lesions.  Skin cancer screening performed today.  Varicose Veins - Dilated blue, purple or red veins at the lower extremities - Reassured - These can be treated by sclerotherapy (a procedure to inject a medicine into the veins to make them disappear) if desired, but the treatment is not covered by insurance -Discussed treatment with vascular surgeon if symptoms.  Seborrheic dermatitis face Improving but persistent.  Advised to continue treatment as it is treatable but not curable.  He will need to continue treatment indefinitely. Cont Hydrocortisone Lotion 2.5% 3x/wk Monday, Wednesday and Friday Cont Ketoconazole 2% cr 3x/wk Tuesday, Thursday and Saturday  AK (actinic keratosis) (17) scalp/forehead x 17  Destruction of lesion - scalp/forehead x 17 Complexity: simple   Destruction method: cryotherapy   Informed consent: discussed and consent obtained   Timeout:  patient name, date of birth, surgical site, and procedure verified Lesion destroyed using liquid nitrogen: Yes   Region frozen until ice ball extended beyond lesion: Yes   Outcome: patient tolerated procedure well with no complications   Post-procedure details: wound care instructions given    Inflamed seborrheic keratosis (2) L tricep x 1, R forearm x 1  Destruction of lesion - L tricep x 1, R forearm x 1 Complexity: simple   Destruction method: cryotherapy   Informed consent: discussed and consent obtained   Timeout:  patient name, date of birth, surgical site, and procedure verified  Lesion destroyed using liquid nitrogen: Yes   Region frozen until ice ball extended beyond lesion: Yes   Outcome: patient tolerated procedure well with no complications   Post-procedure details: wound care instructions given    Return in about 6  months (around 11/07/2020) for UBSE, AKs.   I, Sonya Hupman, RMA, am acting as scribe for Sarina Ser, MD .  Documentation: I have reviewed the above documentation for accuracy and completeness, and I agree with the above.  Sarina Ser, MD

## 2020-05-09 NOTE — Patient Instructions (Addendum)
Cryotherapy Aftercare  . Wash gently with soap and water everyday.   . Apply Vaseline and Band-Aid daily until healed.  

## 2020-05-10 ENCOUNTER — Encounter: Payer: Self-pay | Admitting: Dermatology

## 2020-06-04 ENCOUNTER — Ambulatory Visit: Payer: PPO | Admitting: Orthopaedic Surgery

## 2020-06-04 ENCOUNTER — Ambulatory Visit (INDEPENDENT_AMBULATORY_CARE_PROVIDER_SITE_OTHER): Payer: PPO

## 2020-06-04 ENCOUNTER — Encounter: Payer: Self-pay | Admitting: Orthopaedic Surgery

## 2020-06-04 ENCOUNTER — Other Ambulatory Visit: Payer: Self-pay

## 2020-06-04 VITALS — Ht 64.5 in | Wt 217.0 lb

## 2020-06-04 DIAGNOSIS — Z96651 Presence of right artificial knee joint: Secondary | ICD-10-CM

## 2020-06-04 NOTE — Progress Notes (Signed)
Office Visit Note   Patient: Jim Dodson           Date of Birth: 04/13/1941           MRN: [id number] Visit Date: 06/04/2020              Requested by: Shon Baton, [address],  [place] [id number] PCP: Shon Baton, MD   Assessment & Plan: Visit Diagnoses:  1. Hx of total knee replacement, right     Plan: Jim Dodson is doing very well for 19-month checkup.  Dental prophylaxis reinforced.  Recheck in 6 months with two-view x-rays of the right knee.  Follow-Up Instructions: Return in about 6 months (around 12/02/2020).   Orders:  Orders Placed This Encounter  Procedures  . XR Knee 1-2 Views Right   No orders of the defined types were placed in this encounter.     Procedures: No procedures performed   Clinical Data: No additional findings.   Subjective: Chief Complaint  Patient presents with  . Right Knee - Follow-up    Right total knee arthroplasty 12/04/2019    Jim Dodson is 6 months status post right total knee replacement.  Overall doing well and reports no pain.  Ambulating with a cane.  No complaints.   Review of Systems   Objective: Vital Signs: Ht 5' 4.5" (1.638 m)   Wt 217 lb (98.4 kg)   BMI 36.67 kg/m   Physical Exam  Ortho Exam Right knee shows a fully healed surgical scar.  Excellent range of motion.  No swelling. Specialty Comments:  No specialty comments available.  Imaging: XR Knee 1-2 Views Right  Result Date: 06/04/2020 Stable total knee replacement in good alignment     PMFS History: Patient Active Problem List   Diagnosis Date Noted  . Status post total right knee replacement 12/04/2019  . Primary osteoarthritis of right knee 08/16/2019  . Iron deficiency anemia, unspecified 02/09/2019  . Ischemic colitis ([place]) 02/09/2019  . Hyperlipidemia 04/20/2018  . Total knee replacement status 04/08/2016  . Abnormal finding of diagnostic imaging 02/28/2016  . Sepsis ([place]) 07/25/2015  . On continuous oral  anticoagulation 07/25/2015  . Essential hypertension 07/25/2015  . GERD (gastroesophageal reflux disease) 07/25/2015  . Acute upper respiratory infection 07/25/2015  . Bladder outlet obstruction 05/28/2015  . Incarcerated ventral hernia 03/14/2015  . Atrial fibrillation with rapid ventricular response ([place])   . Chronic coronary artery disease   . Elevated troponin   . Patient receiving intravenous heparin therapy   . SBO (small bowel obstruction) ([place]) 03/08/2015  . Male urinary stress incontinence 08/17/2012  . ED (erectile dysfunction) of organic origin 03/30/2012  . Incomplete emptying of bladder 03/30/2012  . Inguinal hernia 03/30/2012  . Kidney stone 03/30/2012  . Malignant neoplasm of prostate ([place]) 03/30/2012   Past Medical History:  Diagnosis Date  . A-fib   . Actinic keratosis   . Arthritis   . Atrial fibrillation ([place])   . Coronary artery disease    stent placed 2009  . Dementia ([place])    memory loss  . Headache   . History of hiatal hernia   . History of kidney stones    patient denies  . Hypertension   . Prostate cancer [facility])    prostate cancer, had sx  . Seborrheic dermatitis   . Shortness of breath dyspnea     History reviewed. No pertinent family history.  Past Surgical History:  Procedure Laterality Date  . CARDIAC  CATHETERIZATION     2009, stent placed  . CARDIOVERSION N/A 03/13/2015   Procedure: CARDIOVERSION;  Surgeon: Jerline Pain, MD;  Location: [place];  Service: Cardiovascular;  Laterality: N/A;  . COLONOSCOPY WITH PROPOFOL N/A 07/19/2015   Procedure: COLONOSCOPY WITH PROPOFOL;  Surgeon: Manya Silvas, MD;  Location: [facility] ENDOSCOPY;  Service: Endoscopy;  Laterality: N/A;  . COLONOSCOPY WITH PROPOFOL N/A 01/27/2019   Procedure: COLONOSCOPY WITH PROPOFOL;  Surgeon: Manya Silvas, MD;  Location: [facility] ENDOSCOPY;  Service: Endoscopy;  Laterality: N/A;  . cryo surgery of prostate    . ESOPHAGOGASTRODUODENOSCOPY N/A 01/27/2019   Procedure:  ESOPHAGOGASTRODUODENOSCOPY (EGD);  Surgeon: Manya Silvas, MD;  Location: [facility] ENDOSCOPY;  Service: Endoscopy;  Laterality: N/A;  . ESOPHAGOGASTRODUODENOSCOPY (EGD) WITH PROPOFOL N/A 07/19/2015   Procedure: ESOPHAGOGASTRODUODENOSCOPY (EGD) WITH PROPOFOL;  Surgeon: Manya Silvas, MD;  Location: [facility] ENDOSCOPY;  Service: Endoscopy;  Laterality: N/A;  . LAPAROTOMY N/A 03/08/2015   Procedure: EXPLORATORY LAPAROTOMY;  Surgeon: Autumn Messing III, MD;  Location: [place];  Service: General;  Laterality: N/A;  . LAPAROTOMY N/A 03/09/2015   Procedure: EXPLORATORY LAPAROTOMY AND EVACUATION OF HEMATOMA;  Surgeon: Autumn Messing III, MD;  Location: [place];  Service: General;  Laterality: N/A;  . LYSIS OF ADHESION N/A 03/08/2015   Procedure: LYSIS OF ADHESION;  Surgeon: Autumn Messing III, MD;  Location: [place];  Service: General;  Laterality: N/A;  . OMENTECTOMY N/A 03/08/2015   Procedure: OMENTECTOMY;  Surgeon: Autumn Messing III, MD;  Location: [place];  Service: General;  Laterality: N/A;  . TEE WITHOUT CARDIOVERSION N/A 03/13/2015   Procedure: TRANSESOPHAGEAL ECHOCARDIOGRAM (TEE);  Surgeon: Jerline Pain, MD;  Location: [place];  Service: Cardiovascular;  Laterality: N/A;  . TOTAL KNEE ARTHROPLASTY Left 04/08/2016   Procedure: LEFT TOTAL KNEE ARTHROPLASTY;  Surgeon: Leandrew Koyanagi, MD;  Location: [place];  Service: Orthopedics;  Laterality: Left;  . TOTAL KNEE ARTHROPLASTY Right 12/04/2019   Procedure: RIGHT TOTAL KNEE ARTHROPLASTY;  Surgeon: Leandrew Koyanagi, MD;  Location: [place];  Service: Orthopedics;  Laterality: Right;  . UMBILICAL HERNIA REPAIR  03/08/2015   Procedure: UMBILICAL HERNIA REPAIR  ADULT;  Surgeon: Autumn Messing III, MD;  Location: [place];  Service: General;;  . VENTRAL HERNIA REPAIR N/A 03/08/2015   Procedure:  VENTRAL HERNIA  REPAIR ADULT;  Surgeon: Autumn Messing III, MD;  Location: [place];  Service: General;  Laterality: N/A;   Social History   Occupational History  . Not on file  Tobacco Use  . Smoking status: Former  Smoker    Years: 20.00    Types: Cigarettes    Quit date: 08/03/1988    Years since quitting: 31.8  . Smokeless tobacco: Never Used  Vaping Use  . Vaping Use: Never used  Substance and Sexual Activity  . Alcohol use: No  . Drug use: No  . Sexual activity: Yes

## 2020-06-12 ENCOUNTER — Other Ambulatory Visit: Payer: Self-pay | Admitting: *Deleted

## 2020-06-12 ENCOUNTER — Inpatient Hospital Stay: Payer: PPO | Attending: Radiation Oncology

## 2020-06-12 ENCOUNTER — Other Ambulatory Visit: Payer: Self-pay

## 2020-06-12 DIAGNOSIS — C61 Malignant neoplasm of prostate: Secondary | ICD-10-CM | POA: Diagnosis not present

## 2020-06-14 LAB — PROSTATE-SPECIFIC AG, SERUM (LABCORP): Prostate Specific Ag, Serum: <0.1 ng/mL (ref 0.0–4.0)

## 2020-06-19 ENCOUNTER — Other Ambulatory Visit: Payer: Self-pay

## 2020-06-19 ENCOUNTER — Other Ambulatory Visit: Payer: Self-pay | Admitting: *Deleted

## 2020-06-19 ENCOUNTER — Encounter: Payer: Self-pay | Admitting: Radiation Oncology

## 2020-06-19 ENCOUNTER — Ambulatory Visit
Admission: RE | Admit: 2020-06-19 | Discharge: 2020-06-19 | Disposition: A | Discharge status: Home / Self Care | Payer: PPO | Source: Ambulatory Visit | Attending: Radiation Oncology | Admitting: Radiation Oncology

## 2020-06-19 VITALS — BP 129/76 | HR 59 | Temp 97.7°F | Resp 20 | Wt 222.1 lb

## 2020-06-19 DIAGNOSIS — C61 Malignant neoplasm of prostate: Secondary | ICD-10-CM | POA: Diagnosis not present

## 2020-06-19 NOTE — Progress Notes (Signed)
Radiation Oncology Follow up Note  Name: Jim Dodson   Date:   06/19/2020 MRN:  [id number] DOB: 25-Mar-1941    This 79 y.o. male presents to the clinic today for 2-year follow-up status post salvage radiation therapy status post cryotherapy and patient with known stage IIIb adenocarcinoma the prostate.  REFERRING PROVIDER: Shon Baton, MD  HPI: Patient is a 79 year old male now out 3 years having pleated salvage radiation therapy.  He did undergone cryotherapy for stage IIIb adenocarcinoma carcinoma the prostate.  He is seen today in routine follow-up is doing fairly well he does complain of some urinary dribbling.  He has nocturia x1 no significant urgency or frequency.  His most recent PSA is less than 0.1.  COMPLICATIONS OF TREATMENT: none  FOLLOW UP COMPLIANCE: keeps appointments   PHYSICAL EXAM:  BP 129/76 (BP Location: Left Arm, Patient Position: Sitting, Cuff Size: Large)   Pulse (!) 59   Temp 97.7 F (36.5 C) (Tympanic)   Resp 20   Wt 222 lb 1.6 oz (100.7 kg)   SpO2 97%   BMI 37.53 kg/m  Well-developed well-nourished patient in NAD. HEENT reveals PERLA, EOMI, discs not visualized.  Oral cavity is clear. No oral mucosal lesions are identified. Neck is clear without evidence of cervical or supraclavicular adenopathy. Lungs are clear to A&P. Cardiac examination is essentially unremarkable with regular rate and rhythm without murmur rub or thrill. Abdomen is benign with no organomegaly or masses noted. Motor sensory and DTR levels are equal and symmetric in the upper and lower extremities. Cranial nerves II through XII are grossly intact. Proprioception is intact. No peripheral adenopathy or edema is identified. No motor or sensory levels are noted. Crude visual fields are within normal range.  RADIOLOGY RESULTS: No current films to review  PLAN: Present time patient is under excellent biochemical control of his prostate cancer.  I have referred him back to urology for  consideration of recommendations for his dribbling.  I have assured him after his prostate cancer diagnosis as well as cryotherapy and radiation this is not significant side effects from treatment.  I have asked to see the patient back in 1 year for follow-up with a PSA at that time.  Patient knows to call with any concerns.  I would like to take this opportunity to thank you for allowing me to participate in the care of your patient.Noreene Filbert, MD

## 2020-07-04 ENCOUNTER — Other Ambulatory Visit: Payer: Self-pay | Admitting: Urology

## 2020-07-04 DIAGNOSIS — R32 Unspecified urinary incontinence: Secondary | ICD-10-CM

## 2020-07-19 DIAGNOSIS — R946 Abnormal results of thyroid function studies: Secondary | ICD-10-CM | POA: Diagnosis not present

## 2020-07-19 DIAGNOSIS — D696 Thrombocytopenia, unspecified: Secondary | ICD-10-CM | POA: Diagnosis not present

## 2020-07-19 DIAGNOSIS — I2510 Atherosclerotic heart disease of native coronary artery without angina pectoris: Secondary | ICD-10-CM | POA: Diagnosis not present

## 2020-07-19 DIAGNOSIS — Z23 Encounter for immunization: Secondary | ICD-10-CM | POA: Diagnosis not present

## 2020-07-19 DIAGNOSIS — M8580 Other specified disorders of bone density and structure, unspecified site: Secondary | ICD-10-CM | POA: Diagnosis not present

## 2020-07-19 DIAGNOSIS — Z8616 Personal history of COVID-19: Secondary | ICD-10-CM | POA: Diagnosis not present

## 2020-07-19 DIAGNOSIS — I77810 Thoracic aortic ectasia: Secondary | ICD-10-CM | POA: Diagnosis not present

## 2020-07-19 DIAGNOSIS — I110 Hypertensive heart disease with heart failure: Secondary | ICD-10-CM | POA: Diagnosis not present

## 2020-07-19 DIAGNOSIS — I8390 Asymptomatic varicose veins of unspecified lower extremity: Secondary | ICD-10-CM | POA: Diagnosis not present

## 2020-07-19 DIAGNOSIS — I5022 Chronic systolic (congestive) heart failure: Secondary | ICD-10-CM | POA: Diagnosis not present

## 2020-07-19 DIAGNOSIS — C61 Malignant neoplasm of prostate: Secondary | ICD-10-CM | POA: Diagnosis not present

## 2020-07-19 DIAGNOSIS — I480 Paroxysmal atrial fibrillation: Secondary | ICD-10-CM | POA: Diagnosis not present

## 2020-08-13 ENCOUNTER — Ambulatory Visit: Payer: PPO | Admitting: Neurology

## 2020-08-13 ENCOUNTER — Telehealth: Payer: Self-pay | Admitting: Neurology

## 2020-08-13 ENCOUNTER — Encounter: Payer: Self-pay | Admitting: Neurology

## 2020-08-13 VITALS — BP 128/74 | HR 53 | Ht 65.0 in | Wt 211.0 lb

## 2020-08-13 DIAGNOSIS — F03918 Unspecified dementia, unspecified severity, with other behavioral disturbance: Secondary | ICD-10-CM | POA: Insufficient documentation

## 2020-08-13 DIAGNOSIS — R413 Other amnesia: Secondary | ICD-10-CM | POA: Diagnosis not present

## 2020-08-13 DIAGNOSIS — F0391 Unspecified dementia with behavioral disturbance: Secondary | ICD-10-CM

## 2020-08-13 DIAGNOSIS — E559 Vitamin D deficiency, unspecified: Secondary | ICD-10-CM | POA: Diagnosis not present

## 2020-08-13 MED ORDER — DONEPEZIL HCL 5 MG PO TABS: 5.0000 mg | ORAL_TABLET | Freq: Every day | ORAL | 3 refills | Status: DC

## 2020-08-13 NOTE — Progress Notes (Signed)
[facility]  PATIENT: KAUAN SCHOLLE DOB: 09/04/1940  REFERRING DOCTOR OR PCP: Shon Baton, MD SOURCE: Patient, wife, notes from Dr. Virgina Jock, imaging and laboratory reports, CT scan personally reviewed.  _________________________________   HISTORICAL  CHIEF COMPLAINT:  Chief Complaint  Patient presents with  . New Patient (Initial Visit)    "Having trouble with memory Room 12, wife Sherian in room     HISTORY OF PRESENT ILLNESS:  I had the pleasure of seeing your patient, Xue Overton, at [facility] for neurologic consultation regarding his difficulties with memory loss.  He is a 80 year old man who began to note difficulty with hs memory.   He believes the difficulty has only been a year but his wife noted issues a few years ago.   He can't remember things he did that day.  He does not remmber his wife's phone number.    He got lost driving last week.     He does not balance the checkbook.       He does not have any hobbies.   Since knee surgery, he does not take walks.    He does word search puzzles at times.   He plays solitaire on his phone.    Much of the day he watches TV.   He does simple chores around the house.    He was never on donepezil but has been on memantine, initially 5 mg and now 10 mg po bid.   His wife does not feel it did not help.    His personality changed the past 2 years and he is often upset.   He raises his voice and gets upset at the slightest thing.      No change in eating or diet.    No cravings.   No inappropriate behavior.     He has had AFib x 5-6 years first noted during a hernia surgery admission.   He has urinary urgency and occassional urge incontinence.  This has worsened the past couple years despite tamsulosin and Myrbetriq..    H/O HTN but well controlled. No DM.      Personally reviewed the CT scan of the head from 07/25/2015.  It shows mild generalized cortical atrophy and some hypodense changes in  the hemispheres consistent with chronic microvascular ischemic changes.  There were no acute findings.   CBC and CMP were essentially normal April or May 2021.  Due to getting lost while driving last week he has allowed his wife to drive him this week.  I recommended that he continue not to drive.  There are several guns in the house and I recommended that they be removed or locked away.  [facility] Cognitive Assessment  08/13/2020  Visuospatial/ Executive (0/5) 3  Naming (0/3) 3  Attention: Read list of digits (0/2) 2  Attention: Read list of letters (0/1) 0  Attention: Serial 7 subtraction starting at 100 (0/3) 0  Language: Repeat phrase (0/2) 2  Language : Fluency (0/1) 0  Abstraction (0/2) 1  Delayed Recall (0/5) 0  Orientation (0/6) 5  Total 16  Adjusted Score (based on education) 16     REVIEW OF SYSTEMS: Constitutional: No fevers, chills, sweats, or change in appetite.  He sleeps well.   Eyes: No visual changes, double vision, eye pain Ear, nose and throat: No hearing loss, ear pain, nasal congestion, sore throat Cardiovascular: No chest pain, palpitations Respiratory: No shortness of breath at rest or with exertion.   No  wheezes GastrointestinaI: No nausea, vomiting, diarrhea, abdominal pain, fecal incontinence Genitourinary: No dysuria, urinary retention or frequency.  No nocturia. Musculoskeletal: No neck pain, back pain.  He has a recent knee replacement Integumentary: No rash, pruritus, skin lesions Neurological: as above Psychiatric: No depression at this time.  No anxiety Endocrine: No palpitations, diaphoresis, change in appetite, change in weigh or increased thirst Hematologic/Lymphatic: No anemia, purpura, petechiae. Allergic/Immunologic: No itchy/runny eyes, nasal congestion, recent allergic reactions, rashes  ALLERGIES: Allergies  Allergen Reactions  . Statins Swelling and Other (See Comments)    Legs swelled up and cramped up really bad    HOME  MEDICATIONS:  Current Outpatient Medications:  .  apixaban (ELIQUIS) 5 MG TABS tablet, Take 5 mg by mouth 2 (two) times daily., Disp: , Rfl:  .  Carboxymethylcellul-Glycerin (REFRESH OPTIVE PF OP), Place 1 drop into both eyes in the morning and at bedtime., Disp: , Rfl:  .  Cholecalciferol (VITAMIN D3) 5000 units TABS, Take 5,000 Units by mouth daily. , Disp: , Rfl:  .  donepezil (ARICEPT) 5 MG tablet, Take 1 tablet (5 mg total) by mouth at bedtime., Disp: 30 tablet, Rfl: 3 .  ferrous sulfate 325 (65 FE) MG tablet, Take 325 mg by mouth in the morning and at bedtime. , Disp: , Rfl:  .  hydrALAZINE (APRESOLINE) 25 MG tablet, Take 25 mg by mouth 3 (three) times daily., Disp: , Rfl:  .  hydrochlorothiazide (HYDRODIURIL) 25 MG tablet, Take 25 mg by mouth daily., Disp: , Rfl:  .  hydrocortisone 2.5 % lotion, Apply 1 application topically every Monday, Wednesday, and Friday. At night, Disp: , Rfl:  .  ketoconazole (NIZORAL) 2 % cream, Apply 1 application topically 3 (three) times a week. Tues, Thurs, Sat at night, Disp: , Rfl:  .  loperamide (IMODIUM) 2 MG capsule, Take 2 mg by mouth daily as needed for diarrhea or loose stools. , Disp: , Rfl:  .  loratadine (CLARITIN) 10 MG tablet, Take 10 mg by mouth daily., Disp: , Rfl:  .  meclizine (ANTIVERT) 12.5 MG tablet, Take 12.5 mg by mouth daily., Disp: , Rfl:  .  memantine (NAMENDA) 10 MG tablet, Take 10 mg by mouth 2 (two) times daily. , Disp: , Rfl:  .  metoprolol (LOPRESSOR) 100 MG tablet, Take 1 tablet (100 mg total) by mouth 2 (two) times daily., Disp: 60 tablet, Rfl: 0 .  MYRBETRIQ 50 MG TB24 tablet, TAKE 1 TABLET BY MOUTH DAILY, Disp: 30 tablet, Rfl: 11 .  omeprazole (PRILOSEC) 20 MG capsule, Take 20 mg by mouth daily., Disp: , Rfl:  .  OVER THE COUNTER MEDICATION, Take 1 capsule by mouth in the morning, at noon, and at bedtime. Cissus XT otc supplement, Disp: , Rfl:  .  Red Yeast Rice Extract 600 MG CAPS, Take 600 mg by mouth 2 (two) times daily. ,  Disp: , Rfl:  .  tamsulosin (FLOMAX) 0.4 MG CAPS capsule, TAKE 1 CAPSULE BY MOUTH TWICE DAILY, Disp: 60 capsule, Rfl: 11  PAST MEDICAL HISTORY: Past Medical History:  Diagnosis Date  . A-fib   . Actinic keratosis   . Arthritis   . Atrial fibrillation ([place])   . Coronary artery disease    stent placed 2009  . Dementia ([place])    memory loss  . Headache   . History of hiatal hernia   . History of kidney stones    patient denies  . Hypertension   . Prostate cancer ([place])  prostate cancer, had sx  . Seborrheic dermatitis   . Shortness of breath dyspnea     PAST SURGICAL HISTORY: Past Surgical History:  Procedure Laterality Date  . CARDIAC CATHETERIZATION     2009, stent placed  . CARDIOVERSION N/A 03/13/2015   Procedure: CARDIOVERSION;  Surgeon: Jerline Pain, MD;  Location: [place];  Service: Cardiovascular;  Laterality: N/A;  . COLONOSCOPY WITH PROPOFOL N/A 07/19/2015   Procedure: COLONOSCOPY WITH PROPOFOL;  Surgeon: Manya Silvas, MD;  Location[facility] ENDOSCOPY;  Service: Endoscopy;  Laterality: N/A;  . COLONOSCOPY WITH PROPOFOL N/A 01/27/2019   Procedure: COLONOSCOPY WITH PROPOFOL;  Surgeon: Manya Silvas, MD;  Location: [facility] ENDOSCOPY;  Service: Endoscopy;  Laterality: N/A;  . cryo surgery of prostate    . ESOPHAGOGASTRODUODENOSCOPY N/A 01/27/2019   Procedure: ESOPHAGOGASTRODUODENOSCOPY (EGD);  Surgeon: Manya Silvas, MD;  Location: [facility] ENDOSCOPY;  Service: Endoscopy;  Laterality: N/A;  . ESOPHAGOGASTRODUODENOSCOPY (EGD) WITH PROPOFOL N/A 07/19/2015   Procedure: ESOPHAGOGASTRODUODENOSCOPY (EGD) WITH PROPOFOL;  Surgeon: Manya Silvas, MD;  Location[facility] ENDOSCOPY;  Service: Endoscopy;  Laterality: N/A;  . LAPAROTOMY N/A 03/08/2015   Procedure: EXPLORATORY LAPAROTOMY;  Surgeon: Autumn Messing III, MD;  Location: [place];  Service: General;  Laterality: N/A;  . LAPAROTOMY N/A 03/09/2015   Procedure: EXPLORATORY LAPAROTOMY AND EVACUATION OF HEMATOMA;  Surgeon: Autumn Messing  III, MD;  Location: [place];  Service: General;  Laterality: N/A;  . LYSIS OF ADHESION N/A 03/08/2015   Procedure: LYSIS OF ADHESION;  Surgeon: Autumn Messing III, MD;  Location: [place];  Service: General;  Laterality: N/A;  . OMENTECTOMY N/A 03/08/2015   Procedure: OMENTECTOMY;  Surgeon: Autumn Messing III, MD;  Location: [place];  Service: General;  Laterality: N/A;  . TEE WITHOUT CARDIOVERSION N/A 03/13/2015   Procedure: TRANSESOPHAGEAL ECHOCARDIOGRAM (TEE);  Surgeon: Jerline Pain, MD;  Location: [place];  Service: Cardiovascular;  Laterality: N/A;  . TOTAL KNEE ARTHROPLASTY Left 04/08/2016   Procedure: LEFT TOTAL KNEE ARTHROPLASTY;  Surgeon: Leandrew Koyanagi, MD;  Location: [place];  Service: Orthopedics;  Laterality: Left;  . TOTAL KNEE ARTHROPLASTY Right 12/04/2019   Procedure: RIGHT TOTAL KNEE ARTHROPLASTY;  Surgeon: Leandrew Koyanagi, MD;  Location: [place];  Service: Orthopedics;  Laterality: Right;  . UMBILICAL HERNIA REPAIR  03/08/2015   Procedure: UMBILICAL HERNIA REPAIR  ADULT;  Surgeon: Autumn Messing III, MD;  Location: [place];  Service: General;;  . VENTRAL HERNIA REPAIR N/A 03/08/2015   Procedure:  VENTRAL HERNIA  REPAIR ADULT;  Surgeon: Autumn Messing III, MD;  Location: [place];  Service: General;  Laterality: N/A;    FAMILY HISTORY: History reviewed. No pertinent family history.  SOCIAL HISTORY:  Social History   Socioeconomic History  . Marital status: Married    Spouse name: Financial controller  . Number of children: Not on file  . Years of education: Not on file  . Highest education level: Not on file  Occupational History  . Not on file  Tobacco Use  . Smoking status: Former Smoker    Years: 20.00    Types: Cigarettes    Quit date: 08/03/1988    Years since quitting: 32.0  . Smokeless tobacco: Never Used  Vaping Use  . Vaping Use: Never used  Substance and Sexual Activity  . Alcohol use: No  . Drug use: No  . Sexual activity: Yes  Other Topics Concern  . Not on file  Social History Narrative   Lives with  wife  Left Handed   Drinks no caffeine   Social Determinants of Radio broadcast assistant Strain: Not on file  Food Insecurity: Not on file  Transportation Needs: Not on file  Physical Activity: Not on file  Stress: Not on file  Social Connections: Not on file  Intimate Partner Violence: Not on file     PHYSICAL EXAM  Vitals:   08/13/20 1110  BP: 128/74  Pulse: (!) 53  Weight: 211 lb (95.7 kg)  Height: 5\' 5"  (1.651 m)    Body mass index is 35.11 kg/m.   General: The patient is well-developed and well-nourished and in no acute distress  HEENT:  Head is [place]/AT.  Sclera are anicteric.   Neck: No carotid bruits are noted.  The neck is nontender.  Cardiovascular: The heart has a regular rate and rhythm with a normal S1 and S2. There were no murmurs, gallops or rubs.    Skin: Extremities are without rash or  edema.  Musculoskeletal:  Back is nontender  Neurologic Exam  Mental status: The patient is alert and oriented x 2 1/2 (wrongdate but right month and year) at the time of the examination. The patient has poor memory and reduced attention.  The [facility] cognitive assessment score (see above) was 16.   Speech is normal.      Cranial nerves: Extraocular movements are full.  Facial symmetry is present. There is good facial sensation to soft touch bilaterally.Facial strength is normal.  Trapezius and sternocleidomastoid strength is normal. No dysarthria is noted.  The tongue is midline, and the patient has symmetric elevation of the soft palate. No obvious hearing deficits are noted.  Motor:  Muscle bulk is normal.   Tone is normal. Strength is  5 / 5 in all 4 extremities.   Sensory: Sensory testing is intact to pinprick, soft touch and vibration sensation in arms and proximal legs.  Vibration sensation was mildly reduced in the toes.  Coordination: Cerebellar testing reveals good finger-nose-finger and heel-to-shin bilaterally.  Gait and station: Station is normal.    Gait is arthritic. Tandem gait is wide. Romberg is negative.   Reflexes: Deep tendon reflexes are symmetric and normal bilaterally.   Plantar responses are flexor.    DIAGNOSTIC DATA (LABS, IMAGING, TESTING) - I reviewed patient records, labs, notes, testing and imaging myself where available.  Lab Results  Component Value Date   WBC 8.9 12/05/2019   HGB 10.6 (L) 12/05/2019   HCT 32.7 (L) 12/05/2019   MCV 92.9 12/05/2019   PLT 131 (L) 12/05/2019      Component Value Date/Time   NA 137 12/05/2019 0635   K 3.4 (L) 12/05/2019 0635   CL 102 12/05/2019 0635   CO2 26 12/05/2019 0635   GLUCOSE 151 (H) 12/05/2019 0635   BUN 16 12/05/2019 0635   CREATININE 1.23 12/05/2019 0635   CALCIUM 8.2 (L) 12/05/2019 0635   PROT 6.4 (L) 11/30/2019 1030   ALBUMIN 3.6 11/30/2019 1030   AST 20 11/30/2019 1030   ALT 18 11/30/2019 1030   ALKPHOS 43 11/30/2019 1030   BILITOT 0.7 11/30/2019 1030   GFRNONAA 56 (L) 12/05/2019 0635   GFRAA >60 12/05/2019 0635    Lab Results  Component Value Date   TSH 3.313 03/10/2015       ASSESSMENT AND PLAN  Dementia with behavioral disturbance, unspecified dementia type ([place])  Memory loss - Plan: MR BRAIN WO CONTRAST, TSH, Vitamin B12  Vitamin D deficiency - Plan: VITAMIN D 25 Hydroxy (  Vit-D Deficiency, Fractures)    In summary, Mr. Hockaday is a 80 year old man who has had progressive memory and cognitive decline over the past several years.  His performance on the [facility] cognitive assessment (16/30) is consistent with mild to moderate dementia.  His overall pattern is most consistent with Alzheimer's disease.  However, since he has had some behavioral issues over the past year it is possible he could also have frontotemporal dementia.  Additionally there could be some vascular overlap. I discussed with him and his wife that there are some other medications that sometimes can help memory but none of the medicines to help memory with neurodegenerative  processes are that impressive.  I will have him start donepezil 5 mg a week and titrate to 10 mg if tolerated.  I will also check some lab work to rule out vitamin deficiency and thyroid dysfunction.  We will check an MRI of the brain without contrast to determine the extent of chronic ischemic changes as that may also be playing a role and to better assess the anatomy to determine if there is any pattern of atrophy that would allow [place] to more efinitively diagnose him with a specific dementia.  I did advise them that he should not drive.  Additionally I spoke to the wife about having the guns removed.  They will return to see me in 3 months but sooner based on the results of the studies or if he has significant worsening.   Awad Gladd A. Felecia Shelling, MD, [facility] [id number], [id number] PM Certified in Neurology, Clinical Neurophysiology, Sleep Medicine and Neuroimaging  [facility] [address], [place], [place] [address] [phone]

## 2020-08-13 NOTE — Telephone Encounter (Signed)
health team order sent to GI. No auth they will reach out to the patient to schedule.  

## 2020-08-14 LAB — VITAMIN B12: Vitamin B-12: 285 pg/mL (ref 232–1245)

## 2020-08-14 LAB — VITAMIN D 25 HYDROXY (VIT D DEFICIENCY, FRACTURES): Vit D, 25-Hydroxy: 88.4 ng/mL (ref 30.0–100.0)

## 2020-08-14 LAB — TSH: TSH: 5.41 u[IU]/mL — ABNORMAL HIGH (ref 0.450–4.500)

## 2020-08-15 ENCOUNTER — Ambulatory Visit
Admission: RE | Admit: 2020-08-15 | Discharge: 2020-08-15 | Disposition: A | Discharge status: Home / Self Care | Payer: PPO | Source: Ambulatory Visit | Attending: Neurology | Admitting: Neurology

## 2020-08-15 ENCOUNTER — Other Ambulatory Visit: Payer: Self-pay

## 2020-08-15 DIAGNOSIS — R413 Other amnesia: Secondary | ICD-10-CM | POA: Diagnosis not present

## 2020-08-23 ENCOUNTER — Telehealth: Payer: Self-pay | Admitting: Neurology

## 2020-08-23 ENCOUNTER — Other Ambulatory Visit: Payer: Self-pay | Admitting: Neurology

## 2020-08-23 DIAGNOSIS — I639 Cerebral infarction, unspecified: Secondary | ICD-10-CM

## 2020-08-23 NOTE — Telephone Encounter (Signed)
I spoke to his wife about the MRI results.  It shows changes consistent with encephalomalacia from a chronic infarction in the right temporal lobe.  Additionally there are scattered T2/FLAIR hyperintense foci consistent with chronic microvascular ischemic changes.  There is atrophy that has progressed compared to the 2016 CT scan.  The atrophy is more pronounced in the temporal lobes and is right greater than left, probably due to the encephalomalacia though a degenerative process such as Alzheimer's or frontotemporal dementia cannot be ruled out.  There is either dolichoectasia of the basilar artery or a vertebral artery variant.  This looks similar to the CT scan from 2016

## 2020-08-23 NOTE — Telephone Encounter (Signed)
Pt's wife called wanting to know when they will be called with the MRI results. Please advise. 

## 2020-08-23 NOTE — Addendum Note (Signed)
Addended by: Britt Bottom on: 08/23/2020 05:21 PM   Modules accepted: Orders

## 2020-08-23 NOTE — Telephone Encounter (Signed)
Will check carotid doppler

## 2020-08-27 ENCOUNTER — Other Ambulatory Visit: Payer: PPO

## 2020-08-27 DIAGNOSIS — M8589 Other specified disorders of bone density and structure, multiple sites: Secondary | ICD-10-CM | POA: Diagnosis not present

## 2020-08-27 DIAGNOSIS — M1990 Unspecified osteoarthritis, unspecified site: Secondary | ICD-10-CM | POA: Diagnosis not present

## 2020-08-27 DIAGNOSIS — E559 Vitamin D deficiency, unspecified: Secondary | ICD-10-CM | POA: Diagnosis not present

## 2020-08-27 DIAGNOSIS — C61 Malignant neoplasm of prostate: Secondary | ICD-10-CM | POA: Diagnosis not present

## 2020-08-28 ENCOUNTER — Telehealth: Payer: Self-pay | Admitting: Neurology

## 2020-08-28 NOTE — Telephone Encounter (Signed)
Called wife back. Advised US Carotid bilateral ordered to further evaluate carotid arteries/to ensure there is no significant blockage. She verbalized understanding. They showed up for test but came for wrong day and she has to r/s this. She will call to r/s.  She had further questions about MRI. I placed on hold and spoke with MD. Durward Fortes per MD that main takeaway is the stroke that showed to occur on right side of brain in the past. Showed some mini strokes, could be d/t age. He is on blood thinner. He will continue this. Advised if he has any stroke like sx moving forward, she should bring him to ED asap. She verbalized understanding.

## 2020-08-28 NOTE — Telephone Encounter (Signed)
Pt.'s wife [name] is on [place]. She called to see what an ultrasound for husband would be for. Please advise.

## 2020-09-04 ENCOUNTER — Ambulatory Visit
Admission: RE | Admit: 2020-09-04 | Discharge: 2020-09-04 | Disposition: A | Discharge status: Home / Self Care | Payer: PPO | Source: Ambulatory Visit | Attending: Neurology | Admitting: Neurology

## 2020-09-04 ENCOUNTER — Telehealth: Payer: Self-pay | Admitting: *Deleted

## 2020-09-04 DIAGNOSIS — I639 Cerebral infarction, unspecified: Secondary | ICD-10-CM

## 2020-09-04 DIAGNOSIS — I10 Essential (primary) hypertension: Secondary | ICD-10-CM | POA: Diagnosis not present

## 2020-09-04 DIAGNOSIS — R413 Other amnesia: Secondary | ICD-10-CM | POA: Diagnosis not present

## 2020-09-04 DIAGNOSIS — I771 Stricture of artery: Secondary | ICD-10-CM | POA: Diagnosis not present

## 2020-09-04 DIAGNOSIS — I6523 Occlusion and stenosis of bilateral carotid arteries: Secondary | ICD-10-CM | POA: Diagnosis not present

## 2020-09-04 NOTE — Telephone Encounter (Signed)
-----   Message from Britt Bottom, MD sent at 09/04/2020  3:12 PM EST ----- Regarding: carotid ultrasound Please let the patient know that the ultrasound showed mild narrowing but nothing severe enough for medicine changes or surgery

## 2020-09-04 NOTE — Telephone Encounter (Signed)
LVM for wife (on [place]) about results per Dr. Felecia Shelling note. Advised them to call back if they have any further questions/concerns.

## 2020-09-05 DIAGNOSIS — D509 Iron deficiency anemia, unspecified: Secondary | ICD-10-CM | POA: Diagnosis not present

## 2020-09-05 DIAGNOSIS — K219 Gastro-esophageal reflux disease without esophagitis: Secondary | ICD-10-CM | POA: Diagnosis not present

## 2020-09-20 ENCOUNTER — Other Ambulatory Visit: Payer: Self-pay

## 2020-09-20 ENCOUNTER — Encounter: Payer: Self-pay | Admitting: Urology

## 2020-09-20 ENCOUNTER — Ambulatory Visit: Payer: PPO | Admitting: Urology

## 2020-09-20 VITALS — BP 122/78 | HR 68 | Ht 65.0 in | Wt 212.0 lb

## 2020-09-20 DIAGNOSIS — R3129 Other microscopic hematuria: Secondary | ICD-10-CM | POA: Diagnosis not present

## 2020-09-20 DIAGNOSIS — R32 Unspecified urinary incontinence: Secondary | ICD-10-CM | POA: Diagnosis not present

## 2020-09-20 LAB — URINALYSIS, COMPLETE
Bilirubin, UA: NEGATIVE
Glucose, UA: NEGATIVE
Ketones, UA: NEGATIVE
Leukocytes,UA: NEGATIVE
Nitrite, UA: NEGATIVE
Protein,UA: NEGATIVE
Specific Gravity, UA: 1.02 (ref 1.005–1.030)
Urobilinogen, Ur: 1 mg/dL (ref 0.2–1.0)
pH, UA: 6.5 (ref 5.0–7.5)

## 2020-09-20 LAB — MICROSCOPIC EXAMINATION: Bacteria, UA: NONE SEEN

## 2020-09-20 NOTE — Progress Notes (Signed)
09/20/2020 3:45 PM   Jim Dodson 08-09-1940 [id number]  Referring provider: Shon Baton, MD [address] [place],  [place] [address]  Chief Complaint  Patient presents with  . Urinary Incontinence    HPI: 80 y.o. male presents for follow-up of urinary incontinence.   Last seen 04/20/2019  History of intermediate risk prostate cancer status post cryoablation by Dr. Jacqlyn Larsen 2008 and salvage radiation  Post radiation with storage elated voiding symptoms including frequency, urgency, urge incontinence and nocturia x3  No real improvement with tamsulosin and Myrbetriq  Has both urge incontinence and unaware incontinence  Denies dysuria or gross hematuria  No flank, abdominal or pelvic pain   PMH: Past Medical History:  Diagnosis Date  . A-fib   . Actinic keratosis   . Arthritis   . Atrial fibrillation ([place])   . Coronary artery disease    stent placed 2009  . Dementia ([place])    memory loss  . Headache   . History of hiatal hernia   . History of kidney stones    patient denies  . Hypertension   . Prostate cancer [facility])    prostate cancer, had sx  . Seborrheic dermatitis   . Shortness of breath dyspnea     Surgical History: Past Surgical History:  Procedure Laterality Date  . CARDIAC CATHETERIZATION     2009, stent placed  . CARDIOVERSION N/A 03/13/2015   Procedure: CARDIOVERSION;  Surgeon: Jerline Pain, MD;  Location: [place];  Service: Cardiovascular;  Laterality: N/A;  . COLONOSCOPY WITH PROPOFOL N/A 07/19/2015   Procedure: COLONOSCOPY WITH PROPOFOL;  Surgeon: Manya Silvas, MD;  Location: [facility] END[facility]COPY;  Service: Endoscopy;  Laterality: N/A;  . COLONOSCOPY WITH PROPOFOL N/A 01/27/2019   Procedure: COLONOSCOPY WITH PROPOFOL;  Surgeon: Manya Silvas, MD;  Location: [facility] ENDOSCOPY;  Service: Endoscopy;  Laterality: N/A;  . cryo surgery of prostate    . ESOPHAGOGASTRODUODENOSCOPY N/A 01/27/2019   Procedure: ESOPHAGOGASTRODUODENOSCOPY (EGD);   Surgeon: Manya Silvas, MD;  Location: [facility] ENDOSCOPY;  Service: Endoscopy;  Laterality: N/A;  . ESOPHAGOGASTRODUODENOSCOPY (EGD) WITH PROPOFOL N/A 07/19/2015   Procedure: ESOPHAGOGASTRODUODENOSCOPY (EGD) WITH PROPOFOL;  Surgeon: Manya Silvas, MD;  Location: [facility] ENDOSCOPY;  Service: Endoscopy;  Laterality: N/A;  . LAPAROTOMY N/A 03/08/2015   Procedure: EXPLORATORY LAPAROTOMY;  Surgeon: Autumn Messing III, MD;  Location: [place];  Service: General;  Laterality: N/A;  . LAPAROTOMY N/A 03/09/2015   Procedure: EXPLORATORY LAPAROTOMY AND EVACUATION OF HEMATOMA;  Surgeon: Autumn Messing III, MD;  Location: [place];  Service: General;  Laterality: N/A;  . LYSIS OF ADHESION N/A 03/08/2015   Procedure: LYSIS OF ADHESION;  Surgeon: Autumn Messing III, MD;  Location: [place];  Service: General;  Laterality: N/A;  . OMENTECTOMY N/A 03/08/2015   Procedure: OMENTECTOMY;  Surgeon: Autumn Messing III, MD;  Location: [place];  Service: General;  Laterality: N/A;  . TEE WITHOUT CARDIOVERSION N/A 03/13/2015   Procedure: TRANSESOPHAGEAL ECHOCARDIOGRAM (TEE);  Surgeon: Jerline Pain, MD;  Location: [place];  Service: Cardiovascular;  Laterality: N/A;  . TOTAL KNEE ARTHROPLASTY Left 04/08/2016   Procedure: LEFT TOTAL KNEE ARTHROPLASTY;  Surgeon: Leandrew Koyanagi, MD;  Location: [place];  Service: Orthopedics;  Laterality: Left;  . TOTAL KNEE ARTHROPLASTY Right 12/04/2019   Procedure: RIGHT TOTAL KNEE ARTHROPLASTY;  Surgeon: Leandrew Koyanagi, MD;  Location: [place];  Service: Orthopedics;  Laterality: Right;  . UMBILICAL HERNIA REPAIR  03/08/2015   Procedure: UMBILICAL HERNIA REPAIR  ADULT;  Surgeon: Autumn Messing III, MD;  Location: [place];  Service: General;;  . VENTRAL HERNIA REPAIR N/A 03/08/2015   Procedure:  VENTRAL HERNIA  REPAIR ADULT;  Surgeon: Autumn Messing III, MD;  Location: [place];  Service: General;  Laterality: N/A;    Home Medications:  Allergies as of 09/20/2020      Reactions   Statins Swelling, Other (See Comments)   Legs swelled up and cramped  up really bad      Medication List       Accurate as of September 20, 2020  3:45 PM. If you have any questions, ask your nurse or doctor.        apixaban 5 MG Tabs tablet Commonly known as: ELIQUIS Take 5 mg by mouth 2 (two) times daily.   donepezil 5 MG tablet Commonly known as: ARICEPT Take 1 tablet (5 mg total) by mouth at bedtime.   ferrous sulfate 325 (65 FE) MG tablet Take 325 mg by mouth in the morning and at bedtime.   hydrALAZINE 25 MG tablet Commonly known as: APRESOLINE Take 25 mg by mouth 3 (three) times daily.   hydrochlorothiazide 25 MG tablet Commonly known as: HYDRODIURIL Take 25 mg by mouth daily.   hydrocortisone 2.5 % lotion Apply 1 application topically every Monday, Wednesday, and Friday. At night   ketoconazole 2 % cream Commonly known as: NIZORAL Apply 1 application topically 3 (three) times a week. Tues, Thurs, Sat at night   loperamide 2 MG capsule Commonly known as: IMODIUM Take 2 mg by mouth daily as needed for diarrhea or loose stools.   loratadine 10 MG tablet Commonly known as: CLARITIN Take 10 mg by mouth daily.   meclizine 12.5 MG tablet Commonly known as: ANTIVERT Take 12.5 mg by mouth daily.   memantine 10 MG tablet Commonly known as: NAMENDA Take 10 mg by mouth 2 (two) times daily.   metoprolol tartrate 100 MG tablet Commonly known as: LOPRESSOR Take 1 tablet (100 mg total) by mouth 2 (two) times daily.   Myrbetriq 50 MG Tb24 tablet Generic drug: mirabegron ER TAKE 1 TABLET BY MOUTH DAILY   omeprazole 20 MG capsule Commonly known as: PRILOSEC Take 20 mg by mouth daily.   OVER THE COUNTER MEDICATION Take 1 capsule by mouth in the morning, at noon, and at bedtime. Cissus XT otc supplement   Red Yeast Rice Extract 600 MG Caps Take 600 mg by mouth 2 (two) times daily.   REFRESH OPTIVE PF OP Place 1 drop into both eyes in the morning and at bedtime.   tamsulosin 0.4 MG Caps capsule Commonly known as: FLOMAX TAKE 1  CAPSULE BY MOUTH TWICE DAILY   Vitamin D3 125 MCG (5000 UT) Tabs Take 5,000 Units by mouth daily.       Allergies:  Allergies  Allergen Reactions  . Statins Swelling and Other (See Comments)    Legs swelled up and cramped up really bad    Family History: No family history on file.  Social History:  reports that he quit smoking about 32 years ago. Jim smoking use included cigarettes. He quit after 20.00 years of use. He has never used smokeless tobacco. He reports that he does not drink alcohol and does not use drugs.   Physical Exam: BP 122/78   Pulse 68   Ht 5\' 5"  (1.651 m)   Wt 212 lb (96.2 kg)   BMI 35.28 kg/m   Constitutional:  Alert, No acute distress. HEENT: [place] AT, moist  mucus membranes.  Trachea midline, no masses. Cardiovascular: No clubbing, cyanosis, or edema. Respiratory: Normal respiratory effort, no increased work of breathing. Psychiatric: Normal mood and affect.  Laboratory Data:  Urinalysis Dipstick 1+ blood Microscopy 11-30 RBC  Assessment & Plan:    1.  Microhematuria  New problem  AUA risk stratification: High  Discussed the recommend evaluation of high risk hematuria with Jim Dodson and Jim Dodson to include CT urogram and cystoscopy  The procedures were discussed in detail and he desires to proceed  2.  Urinary incontinence  Chronic storage related voiding symptoms and incontinence  Will further assess outlet/bladder on cystoscopy   Jim Sons, MD  [facility] [address], [place],  [address] [phone]

## 2020-09-22 ENCOUNTER — Encounter: Payer: Self-pay | Admitting: Urology

## 2020-10-10 DIAGNOSIS — I10 Essential (primary) hypertension: Secondary | ICD-10-CM | POA: Diagnosis not present

## 2020-10-15 ENCOUNTER — Ambulatory Visit
Admission: RE | Admit: 2020-10-15 | Discharge: 2020-10-15 | Disposition: A | Discharge status: Home / Self Care | Payer: PPO | Source: Ambulatory Visit | Attending: Urology | Admitting: Urology

## 2020-10-15 ENCOUNTER — Other Ambulatory Visit: Payer: Self-pay

## 2020-10-15 DIAGNOSIS — R32 Unspecified urinary incontinence: Secondary | ICD-10-CM | POA: Diagnosis not present

## 2020-10-15 DIAGNOSIS — R350 Frequency of micturition: Secondary | ICD-10-CM | POA: Diagnosis not present

## 2020-10-15 DIAGNOSIS — N200 Calculus of kidney: Secondary | ICD-10-CM | POA: Diagnosis not present

## 2020-10-15 DIAGNOSIS — R3129 Other microscopic hematuria: Secondary | ICD-10-CM | POA: Diagnosis not present

## 2020-10-15 DIAGNOSIS — Z8546 Personal history of malignant neoplasm of prostate: Secondary | ICD-10-CM | POA: Diagnosis not present

## 2020-10-15 LAB — POCT I-STAT CREATININE: Creatinine, Ser: 1.1 mg/dL (ref 0.61–1.24)

## 2020-10-15 MED ORDER — IOHEXOL 300 MG/ML  SOLN
125.0000 mL | Freq: Once | INTRAMUSCULAR | Status: AC | PRN
  Administered 2020-10-15: 125 mL via INTRAVENOUS

## 2020-10-19 NOTE — Progress Notes (Addendum)
   10/21/2020  CC:  Chief Complaint  Patient presents with  . Cysto    [id number] Jim Dodson is a 80 y.o. male with microhematuira and urinary incontinence returns for a cystoscopy.   - CTU on 10/15/2020 showed  bilateral nonobstructive nephrolithiasis. No other specific cause for hematuria identified Scarring in the right mid kidney laterally and in the left kidney lower pole. - Small bilateral hypodense renal lesions are technically too small to characterize although statistically likely to be cysts. Stable bilobed adiposedensity along the left groin likely a combination of direct and indirect inguinal hernias (pantaloon hernia).   Blood pressure 126/69, pulse (!) 56, height 5\' 5"  (1.651 m), weight 214 lb (97.1 kg). NED. A&Ox3.   No respiratory distress   Abd soft, NT, ND Normal phallus with bilateral descended testicles  Cystoscopy Procedure Note  Patient identification was confirmed, informed consent was obtained, and patient was prepped using Betadine solution.  Lidocaine jelly was administered per urethral meatus.     Pre-Procedure: - Inspection reveals a normal caliber urethral meatus.  Procedure: The flexible cystoscope was introduced without difficulty - No urethral strictures/lesions are present. - Mild lateral lobe enlargement of the prostate - Mild elevated bladder neck - Bilateral ureteral orifices identified - Bladder mucosa  reveals no ulcers, tumors, or lesions - No bladder stones - No trabeculation - Left mid bladder base posterior to trigone is an erythematous lesion which may represent a small diverticulum.  Was unable to advance the scope into this area.  Retroflexion no significant abnormalities   Post-Procedure: - Patient tolerated the procedure well   Assessment/ Plan:  1.  Bladder lesion  Findings were discussed with patient  Recommend cystocopy under anesthesia, possible bx.  The procedure was discussed in detail including potential risks of  bleeding, infection  2. Urinary incontinence  Refractory to multiple medications  Trial Gemtesa 75mg  daily   I, Jim Dodson, am acting as a Education administrator for Dr. Nicki Reaper C. Kayleanna Lorman,  I have reviewed the above documentation for accuracy and completeness, and I agree with the above.   Abbie Sons, MD

## 2020-10-19 NOTE — H&P (View-Only) (Signed)
   10/21/2020  CC:  Chief Complaint  Patient presents with  . Cysto    [id number] Jim Dodson is a 80 y.o. male with microhematuira and urinary incontinence returns for a cystoscopy.   - CTU on 10/15/2020 showed  bilateral nonobstructive nephrolithiasis. No other specific cause for hematuria identified Scarring in the right mid kidney laterally and in the left kidney lower pole. - Small bilateral hypodense renal lesions are technically too small to characterize although statistically likely to be cysts. Stable bilobed adiposedensity along the left groin likely a combination of direct and indirect inguinal hernias (pantaloon hernia).   Blood pressure 126/69, pulse (!) 56, height 5\' 5"  (1.651 m), weight 214 lb (97.1 kg). NED. A&Ox3.   No respiratory distress   Abd soft, NT, ND Normal phallus with bilateral descended testicles  Cystoscopy Procedure Note  Patient identification was confirmed, informed consent was obtained, and patient was prepped using Betadine solution.  Lidocaine jelly was administered per urethral meatus.     Pre-Procedure: - Inspection reveals a normal caliber urethral meatus.  Procedure: The flexible cystoscope was introduced without difficulty - No urethral strictures/lesions are present. - Mild lateral lobe enlargement of the prostate - Mild elevated bladder neck - Bilateral ureteral orifices identified - Bladder mucosa  reveals no ulcers, tumors, or lesions - No bladder stones - No trabeculation - Left mid bladder base posterior to trigone is an erythematous lesion which may represent a small diverticulum.  Was unable to advance the scope into this area.  Retroflexion no significant abnormalities   Post-Procedure: - Patient tolerated the procedure well   Assessment/ Plan:  1.  Bladder lesion  Findings were discussed with patient  Recommend cystocopy under anesthesia, possible bx.  The procedure was discussed in detail including potential risks of  bleeding, infection  2. Urinary incontinence  Refractory to multiple medications  Trial Gemtesa 75mg  daily   I, Ardyth Gal, am acting as a Education administrator for Dr. Nicki Reaper C. Zacchaeus Halm,  I have reviewed the above documentation for accuracy and completeness, and I agree with the above.   Abbie Sons, MD

## 2020-10-21 ENCOUNTER — Other Ambulatory Visit: Payer: Self-pay

## 2020-10-21 ENCOUNTER — Ambulatory Visit: Payer: PPO | Admitting: Urology

## 2020-10-21 ENCOUNTER — Encounter: Payer: Self-pay | Admitting: Urology

## 2020-10-21 VITALS — BP 126/69 | HR 56 | Ht 65.0 in | Wt 214.0 lb

## 2020-10-21 DIAGNOSIS — R3129 Other microscopic hematuria: Secondary | ICD-10-CM | POA: Diagnosis not present

## 2020-10-21 DIAGNOSIS — N329 Bladder disorder, unspecified: Secondary | ICD-10-CM

## 2020-10-21 MED ORDER — GEMTESA 75 MG PO TABS: 75.0000 mg | ORAL_TABLET | Freq: Every day | ORAL | 0 refills | Status: DC

## 2020-10-22 LAB — MICROSCOPIC EXAMINATION: Bacteria, UA: NONE SEEN

## 2020-10-22 LAB — URINALYSIS, COMPLETE
Bilirubin, UA: NEGATIVE
Glucose, UA: NEGATIVE
Ketones, UA: NEGATIVE
Leukocytes,UA: NEGATIVE
Nitrite, UA: NEGATIVE
Protein,UA: NEGATIVE
RBC, UA: NEGATIVE
Specific Gravity, UA: 1.02 (ref 1.005–1.030)
Urobilinogen, Ur: 0.2 mg/dL (ref 0.2–1.0)
pH, UA: 7 (ref 5.0–7.5)

## 2020-10-23 ENCOUNTER — Encounter: Payer: Self-pay | Admitting: Urology

## 2020-10-23 LAB — CULTURE, URINE COMPREHENSIVE

## 2020-10-25 ENCOUNTER — Other Ambulatory Visit: Payer: Self-pay | Admitting: Urology

## 2020-10-25 DIAGNOSIS — N329 Bladder disorder, unspecified: Secondary | ICD-10-CM

## 2020-11-08 ENCOUNTER — Other Ambulatory Visit: Admission: RE | Admit: 2020-11-08 | Payer: PPO | Source: Ambulatory Visit

## 2020-11-11 ENCOUNTER — Other Ambulatory Visit: Payer: Self-pay

## 2020-11-11 ENCOUNTER — Other Ambulatory Visit: Payer: Self-pay | Admitting: Neurology

## 2020-11-11 DIAGNOSIS — N329 Bladder disorder, unspecified: Secondary | ICD-10-CM

## 2020-11-12 ENCOUNTER — Other Ambulatory Visit: Payer: PPO

## 2020-11-12 ENCOUNTER — Other Ambulatory Visit: Payer: Self-pay

## 2020-11-12 ENCOUNTER — Encounter
Admission: RE | Admit: 2020-11-12 | Discharge: 2020-11-12 | Disposition: A | Discharge status: Home / Self Care | Payer: PPO | Source: Ambulatory Visit | Attending: Urology | Admitting: Urology

## 2020-11-12 DIAGNOSIS — N329 Bladder disorder, unspecified: Secondary | ICD-10-CM | POA: Diagnosis not present

## 2020-11-12 HISTORY — DX: COVID-19: U07.1

## 2020-11-12 HISTORY — DX: Acute myocardial infarction, unspecified: I21.9

## 2020-11-12 NOTE — Pre-Procedure Instructions (Signed)
Patient has DX of dementia, wife [name] assisted pre admission interview and instructions reviewed with her.

## 2020-11-12 NOTE — Patient Instructions (Addendum)
Your procedure is scheduled on: 11/19/20 - Tuesday Report to the Registration Desk on the 1st floor of the [facility]. To find out your arrival time, please call [phone] between 1PM - 3PM on: 11/18/20 - Monday Report to [facility] for Lab/EKG/Covid test on 11/15/20 at 930 am  REMEMBER: Instructions that are not followed completely may result in serious medical risk, up to and including death; or upon the discretion of your surgeon and anesthesiologist your surgery may need to be rescheduled.  Do not eat food or drink any fluids after midnight the night before surgery.  No gum chewing, lozengers or hard candies.   TAKE THESE MEDICATIONS THE MORNING OF SURGERY WITH A SIP OF WATER:  - hydrALAZINE (APRESOLINE) 25 MG tablet - loratadine (CLARITIN) 10 MG tablet - meclizine (ANTIVERT) 12.5 MG tablet - memantine (NAMENDA) 10 MG tablet - metoprolol (LOPRESSOR) 100 MG tablet - MYRBETRIQ 50 MG TB24 tablet - omeprazole (PRILOSEC) 20 MG capsule, take one the night before and one on the morning of surgery - helps to prevent nausea after surgery. - tamsulosin (FLOMAX) 0.4 MG CAPS capsule - Vibegron (GEMTESA) 75 MG TABS  Follow recommendations from Cardiologist, Pulmonologist or PCP regarding stopping Aspirin, Coumadin, Plavix, Eliquis, Pradaxa, or Pletal. Do not take Eliquis on 04/17, 04/18 and do not take the morning of surgery.  One week prior to surgery: Stop Anti-inflammatories (NSAIDS) such as Advil, Aleve, Ibuprofen, Motrin, Naproxen, Naprosyn and Aspirin based products such as Excedrin, Goodys Powder, BC Powder.  Stop ANY OVER THE COUNTER supplements until after surgery.Cissus XT otc supplement, Red Yeast Rice Extract 600 MG CAPS  No Alcohol for 24 hours before or after surgery.  No Smoking including e-cigarettes for 24 hours prior to surgery.  No chewable tobacco products for at least 6 hours prior to surgery.  No nicotine patches on the day of surgery.  Do not use any  "recreational" drugs for at least a week prior to your surgery.  Please be advised that the combination of cocaine and anesthesia may have negative outcomes, up to and including death. If you test positive for cocaine, your surgery will be cancelled.  On the morning of surgery brush your teeth with toothpaste and water, you may rinse your mouth with mouthwash if you wish. Do not swallow any toothpaste or mouthwash.  Do not wear jewelry, make-up, hairpins, clips or nail polish.  Do not wear lotions, powders, or perfumes.   Do not shave body from the neck down 48 hours prior to surgery just in case you cut yourself which could leave a site for infection.  Also, freshly shaved skin may become irritated if using the CHG soap.  Contact lenses, hearing aids and dentures may not be worn into surgery.  Do not bring valuables to the hospital. [facility] is not responsible for any missing/lost belongings or valuables.   Use CHG Soap or wipes as directed on instruction sheet.  Notify your doctor if there is any change in your medical condition (cold, fever, infection).  Wear comfortable clothing (specific to your surgery type) to the hospital.  Plan for stool softeners for home use; pain medications have a tendency to cause constipation. You can also help prevent constipation by eating foods high in fiber such as fruits and vegetables and drinking plenty of fluids as your diet allows.  After surgery, you can help prevent lung complications by doing breathing exercises.  Take deep breaths and cough every 1-2 hours. Your doctor may order a  device called an Incentive Spirometer to help you take deep breaths. When coughing or sneezing, hold a pillow firmly against your incision with both hands. This is called "splinting." Doing this helps protect your incision. It also decreases belly discomfort.  If you are being admitted to the hospital overnight, leave your suitcase in the car. After surgery it  may be brought to your room.  If you are being discharged the day of surgery, you will not be allowed to drive home. You will need a responsible adult (18 years or older) to drive you home and stay with you that night.   If you are taking public transportation, you will need to have a responsible adult (18 years or older) with you. Please confirm with your physician that it is acceptable to use public transportation.   Please call the [place] Dept. at [phone] if you have any questions about these instructions.  Surgery Visitation Policy:  Patients undergoing a surgery or procedure may have one family member or support person with them as long as that person is not COVID-19 positive or experiencing its symptoms.  That person may remain in the waiting area during the procedure.  Inpatient Visitation:    Visiting hours are 7 a.m. to 8 p.m. Inpatients will be allowed two visitors daily. The visitors may change each day during the patient's stay. No visitors under the age of 58. Any visitor under the age of 72 must be accompanied by an adult. The visitor must pass COVID-19 screenings, use hand sanitizer when entering and exiting the patient's room and wear a mask at all times, including in the patient's room. Patients must also wear a mask when staff or their visitor are in the room. Masking is required regardless of vaccination status.

## 2020-11-14 ENCOUNTER — Other Ambulatory Visit: Payer: Self-pay

## 2020-11-14 ENCOUNTER — Ambulatory Visit: Payer: PPO | Admitting: Dermatology

## 2020-11-14 DIAGNOSIS — Z872 Personal history of diseases of the skin and subcutaneous tissue: Secondary | ICD-10-CM

## 2020-11-14 DIAGNOSIS — D1800 Hemangioma unspecified site: Secondary | ICD-10-CM

## 2020-11-14 DIAGNOSIS — L814 Other melanin hyperpigmentation: Secondary | ICD-10-CM

## 2020-11-14 DIAGNOSIS — L821 Other seborrheic keratosis: Secondary | ICD-10-CM

## 2020-11-14 DIAGNOSIS — Z1283 Encounter for screening for malignant neoplasm of skin: Secondary | ICD-10-CM

## 2020-11-14 DIAGNOSIS — D229 Melanocytic nevi, unspecified: Secondary | ICD-10-CM

## 2020-11-14 DIAGNOSIS — L578 Other skin changes due to chronic exposure to nonionizing radiation: Secondary | ICD-10-CM | POA: Diagnosis not present

## 2020-11-14 DIAGNOSIS — L570 Actinic keratosis: Secondary | ICD-10-CM

## 2020-11-14 DIAGNOSIS — L918 Other hypertrophic disorders of the skin: Secondary | ICD-10-CM | POA: Diagnosis not present

## 2020-11-14 DIAGNOSIS — L219 Seborrheic dermatitis, unspecified: Secondary | ICD-10-CM | POA: Diagnosis not present

## 2020-11-14 MED ORDER — HYDROCORTISONE 2.5 % EX LOTN: TOPICAL_LOTION | CUTANEOUS | 11 refills | Status: DC

## 2020-11-14 MED ORDER — KETOCONAZOLE 2 % EX CREA: 1.0000 "application " | TOPICAL_CREAM | CUTANEOUS | 11 refills | Status: AC

## 2020-11-14 NOTE — Patient Instructions (Addendum)
If you have any questions or concerns for your doctor, please call our main line at [phone] and press option 4 to reach your doctor's medical assistant. If no one answers, please leave a voicemail as directed and we will return your call as soon as possible. Messages left after 4 pm will be answered the following business day.   You may also send [place] a message via [place]. We typically respond to MyChart messages within 1-2 business days.  For prescription refills, please ask your pharmacy to contact our office. Our fax number is [phone].  If you have an urgent issue when the clinic is closed that cannot wait until the next business day, you can page your doctor at the number below.    Please note that while we do our best to be available for urgent issues outside of office hours, we are not available 24/7.   If you have an urgent issue and are unable to reach [place], you may choose to seek medical care at your doctor's office, retail clinic, urgent care center, or emergency room.  If you have a medical emergency, please immediately call 911 or go to the emergency department.  Pager Numbers  - Dr. Nehemiah Massed: [phone]  - Dr. Laurence Ferrari: [phone]  - Dr. Nicole Kindred: [phone]  In the event of inclement weather, please call our main line at [phone] for an update on the status of any delays or closures.  Dermatology Medication Tips: Please keep the boxes that topical medications come in in order to help keep track of the instructions about where and how to use these. Pharmacies typically print the medication instructions only on the boxes and not directly on the medication tubes.   If your medication is too expensive, please contact our office at [phone] option 4 or send [place] a message through [place].   We are unable to tell what your co-pay for medications will be in advance as this is different depending on your insurance coverage. However, we may be able to find a substitute  medication at lower cost or fill out paperwork to get insurance to cover a needed medication.   If a prior authorization is required to get your medication covered by your insurance company, please allow [place] 1-2 business days to complete this process.  Drug prices often vary depending on where the prescription is filled and some pharmacies may offer cheaper prices.  The website www.goodrx.com contains coupons for medications through different pharmacies. The prices here do not account for what the cost may be with help from insurance (it may be cheaper with your insurance), but the website can give you the price if you did not use any insurance.  - You can print the associated coupon and take it with your prescription to the pharmacy.  - You may also stop by our office during regular business hours and pick up a GoodRx coupon card.  - If you need your prescription sent electronically to a different pharmacy, notify our office through [facility] or by phone at [phone] option 4.   For Seborrheic Dermatitis eyebrows, beside nose, beard area, mid chest (scaly areas) Continue Hydrocortisone 2.5% lotion 3 nights a week, Monday, Wednesday and Friday  Continue Ketoconazole 2% cream 3 nights a week Tuesday, Thursday, Saturday

## 2020-11-14 NOTE — Progress Notes (Signed)
Follow-Up Visit   Subjective  Jim Dodson is a 80 y.o. male who presents for the following: Upper body skin exam (Hx of AKs), Actinic Keratosis (Face, scalp, 79m f/u), and Seborrheic Dermatitis (Face, HC 2.5% lotion 3d/wk, Ketoconazole 2% cr 3d/wk). The patient presents for Upper Body Skin Exam (UBSE) for skin cancer screening and mole check.  Patient accompanied by husband and contributes to history.  The following portions of the chart were reviewed this encounter and updated as appropriate:   Tobacco  Allergies  Meds  Problems  Med Hx  Surg Hx  Fam Hx      Review of Systems:  No other skin or systemic complaints except as noted in HPI or Assessment and Plan.  Objective  Well appearing patient in no apparent distress; mood and affect are within normal limits.  All skin waist up examined.  Objective  Nose: Mild scaling eyebrows, nasolabial folds  Objective  Scalp x 5 (5): Pink scaly macules    Assessment & Plan    Lentigines - Scattered tan macules - Due to sun exposure - Benign-appering, observe - Recommend daily broad spectrum sunscreen SPF 30+ to sun-exposed areas, reapply every 2 hours as needed. - Call for any changes  Seborrheic Keratoses - Stuck-on, waxy, tan-brown papules and/or plaques  - Benign-appearing - Discussed benign etiology and prognosis. - Observe - Call for any changes  Melanocytic Nevi - Tan-brown and/or pink-flesh-colored symmetric macules and papules - Benign appearing on exam today - Observation - Call clinic for new or changing moles - Recommend daily use of broad spectrum spf 30+ sunscreen to sun-exposed areas.   Hemangiomas - Red papules - Discussed benign nature - Observe - Call for any changes  Actinic Damage - Chronic condition, secondary to cumulative UV/sun exposure - diffuse scaly erythematous macules with underlying dyspigmentation - Recommend daily broad spectrum sunscreen SPF 30+ to sun-exposed areas,  reapply every 2 hours as needed.  - Staying in the shade or wearing long sleeves, sun glasses (UVA+UVB protection) and wide brim hats (4-inch brim around the entire circumference of the hat) are also recommended for sun protection.  - Call for new or changing lesions.  Skin cancer screening performed today.  Acrochordons (Skin Tags) - Fleshy, skin-colored pedunculated papules - Benign appearing.  - Observe. - If desired, they can be removed with an in office procedure that is not covered by insurance. - Please call the clinic if you notice any new or changing lesions.  Seborrheic dermatitis Nose  Seborrheic Dermatitis  -  is a chronic persistent rash characterized by pinkness and scaling most commonly of the mid face but also can occur on the scalp (dandruff), ears; mid chest and mid back. It tends to be exacerbated by stress and cooler weather.  People who have neurologic disease may experience new onset or exacerbation of existing seborrheic dermatitis.  The condition is not curable but treatable and can be controlled.  Improved  Cont HC 2.5% lotion 3d/wk Monday, Wednesday, Friday  Cont Ketoconazole 2% cr 3d/wk Tuesday, Thursday, Saturday   hydrocortisone 2.5 % lotion - Nose  ketoconazole (NIZORAL) 2 % cream - Nose  AK (actinic keratosis) (5) Scalp x 5  Destruction of lesion - Scalp x 5 Complexity: simple   Destruction method: cryotherapy   Informed consent: discussed and consent obtained   Timeout:  patient name, date of birth, surgical site, and procedure verified Lesion destroyed using liquid nitrogen: Yes   Region frozen until ice ball extended beyond  lesion: Yes   Outcome: patient tolerated procedure well with no complications   Post-procedure details: wound care instructions given    Skin cancer screening  Return in about 6 months (around 05/16/2021) for TBSE, Hx of AKs.  I, Jim Dodson, RMA, am acting as scribe for Jim Ser, MD .  Documentation: I have  reviewed the above documentation for accuracy and completeness, and I agree with the above.  Jim Ser, MD

## 2020-11-15 ENCOUNTER — Encounter: Payer: Self-pay | Admitting: Urology

## 2020-11-15 ENCOUNTER — Other Ambulatory Visit
Admission: RE | Admit: 2020-11-15 | Discharge: 2020-11-15 | Disposition: A | Discharge status: Home / Self Care | Payer: PPO | Source: Ambulatory Visit | Attending: Urology | Admitting: Urology

## 2020-11-15 DIAGNOSIS — Z01818 Encounter for other preprocedural examination: Secondary | ICD-10-CM | POA: Insufficient documentation

## 2020-11-15 DIAGNOSIS — I440 Atrioventricular block, first degree: Secondary | ICD-10-CM | POA: Insufficient documentation

## 2020-11-15 DIAGNOSIS — Z20822 Contact with and (suspected) exposure to covid-19: Secondary | ICD-10-CM | POA: Diagnosis not present

## 2020-11-15 DIAGNOSIS — Z01810 Encounter for preprocedural cardiovascular examination: Secondary | ICD-10-CM | POA: Diagnosis not present

## 2020-11-15 LAB — BASIC METABOLIC PANEL
Anion gap: 6 (ref 5–15)
BUN: 18 mg/dL (ref 8–23)
CO2: 32 mmol/L (ref 22–32)
Calcium: 8.6 mg/dL — ABNORMAL LOW (ref 8.9–10.3)
Chloride: 101 mmol/L (ref 98–111)
Creatinine, Ser: 1.25 mg/dL — ABNORMAL HIGH (ref 0.61–1.24)
GFR, Estimated: 59 mL/min — ABNORMAL LOW (ref >60)
Glucose, Bld: 116 mg/dL — ABNORMAL HIGH (ref 70–99)
Potassium: 3.2 mmol/L — ABNORMAL LOW (ref 3.5–5.1)
Sodium: 139 mmol/L (ref 135–145)

## 2020-11-15 LAB — SARS CORONAVIRUS 2 (TAT 6-24 HRS): SARS Coronavirus 2: NEGATIVE

## 2020-11-15 LAB — CULTURE, URINE COMPREHENSIVE

## 2020-11-15 NOTE — H&P (View-Only) (Signed)
Perioperative Services  Pre-Admission/Anesthesia Testing Clinical Review  Date: 11/15/20  Patient Demographics:  Name: Jim Dodson DOB:   1941/01/15 MRN:   [id number]  Planned Surgical Procedure(s):    Case: [id number] Date/Time: 11/19/20 0907   Procedure: CYSTOSCOPY WITH POSSIBLE BLADDER BIOPSY (N/A )   Anesthesia type: Choice   Pre-op diagnosis: Bladder Lesion   Location: [facility] OR ROOM 10 / [facility]   Surgeons: Abbie Sons, MD    NOTE: Available PAT nursing documentation and vital signs have been reviewed. Clinical nursing staff has updated patient's PMH/PSHx, current medication list, and drug allergies/intolerances to ensure comprehensive history available to assist in medical decision making as it pertains to the aforementioned surgical procedure and anticipated anesthetic course.   Clinical Discussion:  Jim Dodson is a 80 y.o. male who is submitted for pre-surgical anesthesia review and clearance prior to him undergoing the above procedure. Patient is a Former Smoker (quit 08/1988). Pertinent PMH includes: CAD, MI, paroxysmal atrial fibrillation, diastolic heart failure, aortic atherosclerosis, HTN, HLD, SOB, GERD (on daily PPI), hiatal hernia, IDA, OA, lumbar spondylosis, lumbar DDD with multilevel impingement,  dementia.   Patient is followed by cardiology Ubaldo Glassing, MD). He was last seen in the cardiology clinic on 10/10/2020; notes reviewed. At the time of his clinic visit, patient noted to be doing well overall from a cardiovascular perspective. He denied chest pain, shortness of breath, orthopnea, PND, palpitations, peripheral edema, vertiginous symptoms, and presyncope/syncope. PMH significant for cardiovascular diagnoses including CAD. Patient is status post a subendocardial MI in 07/2009 that was treated with PCI and DES x 1 placed to the LAD. Patient also with a paroxysmal atrial fibrillation diagnosis. CHA2DS2-VASc Score = 4 (age x 2, HTN, prior MI).  Patient chronically anticoagulated using apixaban; compliant with therapy with no evidence of GI bleeding. Rate and rhythm controlled by metoprolol. Last TTE performed on 06/26/2015 revealed reduced left ventricular systolic function with mild valvular insufficiency and mild LVH; LVEF 40%.  Subsequent myocardial perfusion imaging study performed on 06/26/2015 revealed moderately reduced LV function with an EF of 37% and no evidence of reversible ischemia (see full interpretation of cardiovascular testing below). Patient on GDMT for his HTN diagnosis.  Blood pressure well controlled at 116/72 on currently prescribed vasodilator, diuretic, and beta blocker therapies. Patient is controlling mixed HLD with dietary modifications and red yeast rice supplement. Functional capacity, as defined by DASI, is documented as being >/= 4 METS.  No changes were made to patient's medication regimen.  Patient to follow-up with outpatient cardiology in 6 months or sooner if needed.  Patient is scheduled to undergo a urological procedure on 11/19/2020 with Dr. Nickolas Madrid. Given patient's past medical history significant for cardiovascular diagnoses, presurgical cardiac clearance was sought by the performing surgeon's office and PAT team. Per cardiology, "this patient is optimized for surgery and may proceed with the planned procedural course with a LOW risk stratification". Again, this patient is on daily anticoagulation. He has been instructed on recommendations for holding his apixaban for 2 days prior to his procedure with plans to restart as soon as postoperative bleeding risk felt to be minimized by his attending surgeon. The patient has been instructed that his last dose of his anticoagulant will be on 11/16/2020.  Patient denies previous perioperative complications with anesthesia in the past. In review of the available records, it is noted that patient underwent a neuraxial + MAC anesthetic course at [facility]  (ASA III) in 12/2019 without  documented complications.   Vitals with BMI 10/21/2020 09/20/2020 08/13/2020  Height _0  _1  _2   Weight 214 lbs 212 lbs 211 lbs  BMI 35.61 25.49 82.64  Systolic 158 309 407  Diastolic 69 78 74  Pulse 56 68 53    Providers/Specialists:   NOTE: Primary physician provider listed below. Patient may have been seen by APP or partner within same practice.   PROVIDER ROLE / SPECIALTY LAST Claud Kelp, MD Urology (Surgeon)  10/21/2020  Shon Baton, MD Primary Care Provider  ???  Bartholome Bill, MD Cardiology  10/10/2020   Allergies:  Statins  Current Home Medications:   No current facility-administered medications for this encounter.   Marland Kitchen apixaban (ELIQUIS) 5 MG TABS tablet  . Carboxymethylcellul-Glycerin (REFRESH OPTIVE PF OP)  . Cholecalciferol (VITAMIN D3) 5000 units TABS  . donepezil (ARICEPT) 5 MG tablet  . ferrous sulfate 325 (65 FE) MG tablet  . hydrALAZINE (APRESOLINE) 25 MG tablet  . hydrochlorothiazide (HYDRODIURIL) 25 MG tablet  . hydrocortisone 2.5 % lotion  . ketoconazole (NIZORAL) 2 % cream  . loperamide (IMODIUM) 2 MG capsule  . loratadine (CLARITIN) 10 MG tablet  . meclizine (ANTIVERT) 12.5 MG tablet  . memantine (NAMENDA) 10 MG tablet  . metoprolol (LOPRESSOR) 100 MG tablet  . MYRBETRIQ 50 MG TB24 tablet  . omeprazole (PRILOSEC) 20 MG capsule  . Red Yeast Rice Extract 600 MG CAPS  . tamsulosin (FLOMAX) 0.4 MG CAPS capsule  . Vibegron (GEMTESA) 75 MG TABS  . hydrocortisone 2.5 % lotion  . ketoconazole (NIZORAL) 2 % cream  . OVER THE COUNTER MEDICATION   History:   Past Medical History:  Diagnosis Date  . Actinic keratosis   . Aortic atherosclerosis ([place])   . Arthritis   . Coronary artery disease   . COVID-19   . DDD (degenerative disc disease), lumbar    with associated multilevel impingement  . Diastolic heart failure ([place])   . Diverticulosis   . GERD (gastroesophageal reflux disease)   . Headache   .  History of hiatal hernia   . History of kidney stones   . HLD (hyperlipidemia)   . Hypertension   . IDA (iron deficiency anemia)   . Lumbar spondylolysis   . Myelolipoma of right adrenal gland   . Paroxysmal atrial fibrillation (H[facility])   . Prostate cancer ([place])   . Seborrheic dermatitis   . Shortness of breath dyspnea   . Subendocardial myocardial infarction [facility]) 07/2009   PCI with DES x 1 placed to LAD   Past Surgical History:  Procedure Laterality Date  . CARDIAC CATHETERIZATION     2009, stent placed  . CARDIOVERSION N/A 03/13/2015   Procedure: CARDIOVERSION;  Surgeon: Jerline Pain, MD;  Location: [place];  Service: Cardiovascular;  Laterality: N/A;  . COLONOSCOPY WITH PROPOFOL N/A 07/19/2015   Procedure: COLONOSCOPY WITH PROPOFOL;  Surgeon: Manya Silvas, MD;  Location: [facility] ENDOSCOPY;  Service: Endoscopy;  Laterality: N/A;  . COLONOSCOPY WITH PROPOFOL N/A 01/27/2019   Procedure: COLONOSCOPY WITH PROPOFOL;  Surgeon: Manya Silvas, MD;  Location: [facility] ENDOSCOPY;  Service: Endoscopy;  Laterality: N/A;  . cryo surgery of prostate    . ESOPHAGOGASTRODUODENOSCOPY N/A 01/27/2019   Procedure: ESOPHAGOGASTRODUODENOSCOPY (EGD);  Surgeon: Manya Silvas, MD;  Location: [facility] ENDOSCOPY;  Service: Endoscopy;  Laterality: N/A;  . ESOPHAGOGASTRODUODENOSCOPY (EGD) WITH PROPOFOL N/A 07/19/2015   Procedure: ESOPHAGOGASTRODUODENOSCOPY (EGD) WITH PROPOFOL;  Surgeon: Manya Silvas, MD;  Location: [place];  Service: Endoscopy;  Laterality: N/A;  . JOINT REPLACEMENT     both knees  . LAPAROTOMY N/A 03/08/2015   Procedure: EXPLORATORY LAPAROTOMY;  Surgeon: Autumn Messing III, MD;  Location: [place];  Service: General;  Laterality: N/A;  . LAPAROTOMY N/A 03/09/2015   Procedure: EXPLORATORY LAPAROTOMY AND EVACUATION OF HEMATOMA;  Surgeon: Autumn Messing III, MD;  Location: [place];  Service: General;  Laterality: N/A;  . LYSIS OF ADHESION N/A 03/08/2015   Procedure: LYSIS OF ADHESION;  Surgeon: Autumn Messing III, MD;  Location: [place];  Service: General;  Laterality: N/A;  . OMENTECTOMY N/A 03/08/2015   Procedure: OMENTECTOMY;  Surgeon: Autumn Messing III, MD;  Location: [place];  Service: General;  Laterality: N/A;  . TEE WITHOUT CARDIOVERSION N/A 03/13/2015   Procedure: TRANSESOPHAGEAL ECHOCARDIOGRAM (TEE);  Surgeon: Jerline Pain, MD;  Location: [place];  Service: Cardiovascular;  Laterality: N/A;  . TOTAL KNEE ARTHROPLASTY Left 04/08/2016   Procedure: LEFT TOTAL KNEE ARTHROPLASTY;  Surgeon: Leandrew Koyanagi, MD;  Location: [place];  Service: Orthopedics;  Laterality: Left;  . TOTAL KNEE ARTHROPLASTY Right 12/04/2019   Procedure: RIGHT TOTAL KNEE ARTHROPLASTY;  Surgeon: Leandrew Koyanagi, MD;  Location: [place];  Service: Orthopedics;  Laterality: Right;  . UMBILICAL HERNIA REPAIR  03/08/2015   Procedure: UMBILICAL HERNIA REPAIR  ADULT;  Surgeon: Autumn Messing III, MD;  Location: [place];  Service: General;;  . VENTRAL HERNIA REPAIR N/A 03/08/2015   Procedure:  VENTRAL HERNIA  REPAIR ADULT;  Surgeon: Autumn Messing III, MD;  Location: [place];  Service: General;  Laterality: N/A;   No family history on file. Social History   Tobacco Use  . Smoking status: Former Smoker    Years: 20.00    Types: Cigarettes    Quit date: 08/03/1988    Years since quitting: 32.3  . Smokeless tobacco: Never Used  Vaping Use  . Vaping Use: Never used  Substance Use Topics  . Alcohol use: No  . Drug use: No    Pertinent Clinical Results:  LABS: Labs reviewed: Acceptable for surgery.    Hospital Outpatient Visit on 11/15/2020  Component Date Value Ref Range Status  . Sodium 11/15/2020 139  135 - 145 mmol/L Final  . Potassium 11/15/2020 3.2* 3.5 - 5.1 mmol/L Final  . Chloride 11/15/2020 101  98 - 111 mmol/L Final  . CO2 11/15/2020 32  22 - 32 mmol/L Final  . Glucose, Bld 11/15/2020 116* 70 - 99 mg/dL Final   Glucose reference range applies only to samples taken after fasting for at least 8 hours.  . BUN 11/15/2020 18  8 - 23 mg/dL  Final  . Creatinine, Ser 11/15/2020 1.25* 0.61 - 1.24 mg/dL Final  . Calcium 11/15/2020 8.6* 8.9 - 10.3 mg/dL Final  . GFR, Estimated 11/15/2020 59* >60 mL/min Final   Comment: (NOTE) Calculated using the CKD-EPI Creatinine Equation (2021)   . Anion gap 11/15/2020 6  5 - 15 Final   Performed at [facility], [place], [place] [address]    ECG: Date: 11/15/2020 Time ECG obtained: 1044 AM Rate: 50 bpm Rhythm: Sinus rhythm with first-degree AV block Axis (leads I and aVF): Normal Intervals: PR 254 ms. QRS 90 ms. QTc 393 ms. ST segment and T wave changes: No evidence of acute ST segment elevation or depression; evidence of previous septal MI Comparison: Similar to previous tracing obtained on 12/04/2019   IMAGING / PROCEDURES: CT HEMATURIA WORKUP performed on 10/15/2020  1. Bilateral nonobstructive nephrolithiasis. No other specific cause for hematuria identified 2. Scarring in the right mid kidney laterally and in the left kidney lower pole. 3. Small bilateral hypodense renal lesions are technically too small to characterize although statistically likely to be cysts. 4. Stable bilobed adipose density along the left groin likely a combination of direct and indirect inguinal hernias (pantaloon hernia). 5. Other imaging findings of potential clinical significance:  Coronary atherosclerosis  Stable right adrenal myelolipoma or lipoma  Sigmoid colon diverticulosis  Lumbar spondylosis and degenerative disc disease with multilevel impingement  Postoperative findings along ventral hernia operative site subcutaneous tissues 6. Aortic atherosclerosis.  BILATERAL CAROTID DOPPLER performed on 09/04/2020 1. Mild bilateral carotid atherosclerosis.  2. No hemodynamically significant ICA stenosis.  3. Degree of narrowing less than 50% bilaterally by ultrasound criteria. 4. Patent antegrade vertebral flow bilaterally  MRI BRAIN WITHOUT CONTRAST performed on  08/15/2020 1. Abnormal MRI scan of the brain without contrast showing moderate changes of generalized cerebral atrophy as well as small vessel disease.   2. Small remote infarct is noted in the right temporal region with encephalomalacia and gliosis.   3. Incidental changes of chronic paranasal sinusitis as well with dolichoectasia of the basilar artery are noted.   4. Overall the cerebral atrophy appears to be progressed compared with CT head dated 2016  MRI THORACIC SPINE WITH/WITHOUT CONTRAST performed on 05/23/2018 1. No MRI findings of metastatic disease.  2. The activity on the bone scan appears to correlate with degenerative endplate findings at the T10-11 and T11-12 levels. 3. Multilevel spondylosis and degenerative disc disease, with mild left foraminal impingement at the T10-11 level resulting. 4. Chronically stable benign right adrenal myelolipoma  LEXISCAN performed on 06/26/2015 1. Equivocal ETT based on baseline electrocardiographic changes 2. Regional wall motion reveals normal myocardial thickening and wall motion 3. Left ventricular cavity size normal 4. No artifacts noted 5. Moderately reduced left ventricular function with an EF of 37% 6. No evidence of reversible ischemia  ECHOCARDIOGRAM performed on 06/26/2015 1. LVEF 40% 2. Mild left ventricular systolic dysfunction with moderate LVH 3. Mild AR, MR, TR, and PR 4. No valvular stenosis 5. Mild PHTN 6. Mild LVH  Impression and Plan:  Jim Dodson has been referred for pre-anesthesia review and clearance prior to him undergoing the planned anesthetic and procedural courses. Available labs, pertinent testing, and imaging results were personally reviewed by me. This patient has been appropriately cleared by cardiology with an overall LOW risk of significant perioperative cardiovascular complications.  Based on clinical review performed today (11/15/20), barring any significant acute changes in the patient's overall  condition, it is anticipated that he will be able to proceed with the planned surgical intervention. Any acute changes in clinical condition may necessitate his procedure being postponed and/or cancelled. Patient will meet with anesthesia team (MD and/or CRNA) on this day of his procedure for preoperative evaluation/assessment.   Pre-surgical instructions were reviewed with the patient during his PAT appointment and questions were fielded by PAT clinical staff. Patient was advised that if any questions or concerns arise prior to his procedure then he should return a call to PAT and/or his surgeon's office to discuss.  Honor Loh, MSN, APRN, FNP-C, CEN [facility]  Peri-operative Services Nurse Practitioner Phone: [phone] 11/15/20 2:30 PM  NOTE: This note has been prepared using Dragon dictation software. Despite my best ability to proofread, there is always the potential that unintentional transcriptional errors may still occur from this  process.

## 2020-11-15 NOTE — Progress Notes (Signed)
Perioperative Services  Pre-Admission/Anesthesia Testing Clinical Review  Date: 11/15/20  Patient Demographics:  Name: Jim Dodson DOB:   1941/01/15 MRN:   [id number]  Planned Surgical Procedure(s):    Case: [id number] Date/Time: 11/19/20 0907   Procedure: CYSTOSCOPY WITH POSSIBLE BLADDER BIOPSY (N/A )   Anesthesia type: Choice   Pre-op diagnosis: Bladder Lesion   Location: [facility] OR ROOM 10 / [place] [facility]   Surgeons: Abbie Sons, MD    NOTE: Available PAT nursing documentation and vital signs have been reviewed. Clinical nursing staff has updated patient's PMH/PSHx, current medication list, and drug allergies/intolerances to ensure comprehensive history available to assist in medical decision making as it pertains to the aforementioned surgical procedure and anticipated anesthetic course.   Clinical Discussion:  Jim Dodson is a 80 y.o. male who is submitted for pre-surgical anesthesia review and clearance prior to him undergoing the above procedure. Patient is a Former Smoker (quit 08/1988). Pertinent PMH includes: CAD, MI, paroxysmal atrial fibrillation, diastolic heart failure, aortic atherosclerosis, HTN, HLD, SOB, GERD (on daily PPI), hiatal hernia, IDA, OA, lumbar spondylosis, lumbar DDD with multilevel impingement,  dementia.   Patient is followed by cardiology Ubaldo Glassing, MD). He was last seen in the cardiology clinic on 10/10/2020; notes reviewed. At the time of his clinic visit, patient noted to be doing well overall from a cardiovascular perspective. He denied chest pain, shortness of breath, orthopnea, PND, palpitations, peripheral edema, vertiginous symptoms, and presyncope/syncope. PMH significant for cardiovascular diagnoses including CAD. Patient is status post a subendocardial MI in 07/2009 that was treated with PCI and DES x 1 placed to the LAD. Patient also with a paroxysmal atrial fibrillation diagnosis. CHA2DS2-VASc Score = 4 (age x 2, HTN, prior MI).  Patient chronically anticoagulated using apixaban; compliant with therapy with no evidence of GI bleeding. Rate and rhythm controlled by metoprolol. Last TTE performed on 06/26/2015 revealed reduced left ventricular systolic function with mild valvular insufficiency and mild LVH; LVEF 40%.  Subsequent myocardial perfusion imaging study performed on 06/26/2015 revealed moderately reduced LV function with an EF of 37% and no evidence of reversible ischemia (see full interpretation of cardiovascular testing below). Patient on GDMT for his HTN diagnosis.  Blood pressure well controlled at 116/72 on currently prescribed vasodilator, diuretic, and beta blocker therapies. Patient is controlling mixed HLD with dietary modifications and red yeast rice supplement. Functional capacity, as defined by DASI, is documented as being >/= 4 METS.  No changes were made to patient's medication regimen.  Patient to follow-up with outpatient cardiology in 6 months or sooner if needed.  Patient is scheduled to undergo a urological procedure on 11/19/2020 with Dr. Nickolas Madrid. Given patient's past medical history significant for cardiovascular diagnoses, presurgical cardiac clearance was sought by the performing surgeon's office and PAT team. Per cardiology, "this patient is optimized for surgery and may proceed with the planned procedural course with a LOW risk stratification". Again, this patient is on daily anticoagulation. He has been instructed on recommendations for holding his apixaban for 2 days prior to his procedure with plans to restart as soon as postoperative bleeding risk felt to be minimized by his attending surgeon. The patient has been instructed that his last dose of his anticoagulant will be on 11/16/2020.  Patient denies previous perioperative complications with anesthesia in the past. In review of the available records, it is noted that patient underwent a neuraxial + MAC anesthetic course at [facility]  (ASA III) in 12/2019 without  documented complications.   Vitals with BMI 10/21/2020 09/20/2020 08/13/2020  Height _0  _1  _2   Weight 214 lbs 212 lbs 211 lbs  BMI 35.61 66.06 30.16  Systolic 010 932 355  Diastolic 69 78 74  Pulse 56 68 53    Providers/Specialists:   NOTE: Primary physician provider listed below. Patient may have been seen by APP or partner within same practice.   PROVIDER ROLE / SPECIALTY LAST Claud Kelp, MD Urology (Surgeon)  10/21/2020  Shon Baton, MD Primary Care Provider  ???  Bartholome Bill, MD Cardiology  10/10/2020   Allergies:  Statins  Current Home Medications:   No current facility-administered medications for this encounter.   Marland Kitchen apixaban (ELIQUIS) 5 MG TABS tablet  . Carboxymethylcellul-Glycerin (REFRESH OPTIVE PF OP)  . Cholecalciferol (VITAMIN D3) 5000 units TABS  . donepezil (ARICEPT) 5 MG tablet  . ferrous sulfate 325 (65 FE) MG tablet  . hydrALAZINE (APRESOLINE) 25 MG tablet  . hydrochlorothiazide (HYDRODIURIL) 25 MG tablet  . hydrocortisone 2.5 % lotion  . ketoconazole (NIZORAL) 2 % cream  . loperamide (IMODIUM) 2 MG capsule  . loratadine (CLARITIN) 10 MG tablet  . meclizine (ANTIVERT) 12.5 MG tablet  . memantine (NAMENDA) 10 MG tablet  . metoprolol (LOPRESSOR) 100 MG tablet  . MYRBETRIQ 50 MG TB24 tablet  . omeprazole (PRILOSEC) 20 MG capsule  . Red Yeast Rice Extract 600 MG CAPS  . tamsulosin (FLOMAX) 0.4 MG CAPS capsule  . Vibegron (GEMTESA) 75 MG TABS  . hydrocortisone 2.5 % lotion  . ketoconazole (NIZORAL) 2 % cream  . OVER THE COUNTER MEDICATION   History:   Past Medical History:  Diagnosis Date  . Actinic keratosis   . Aortic atherosclerosis ([place])   . Arthritis   . Coronary artery disease   . COVID-19   . DDD (degenerative disc disease), lumbar    with associated multilevel impingement  . Diastolic heart failure ([place])   . Diverticulosis   . GERD (gastroesophageal reflux disease)   . Headache   .  History of hiatal hernia   . History of kidney stones   . HLD (hyperlipidemia)   . Hypertension   . IDA (iron deficiency anemia)   . Lumbar spondylolysis   . Myelolipoma of right adrenal gland   . Paroxysmal atrial fibrillation ([facility])   . Prostate cancer ([place])   . Seborrheic dermatitis   . Shortness of breath dyspnea   . Subendocardial myocardial infarction [facility]) 07/2009   PCI with DES x 1 placed to LAD   Past Surgical History:  Procedure Laterality Date  . CARDIAC CATHETERIZATION     2009, stent placed  . CARDIOVERSION N/A 03/13/2015   Procedure: CARDIOVERSION;  Surgeon: Jerline Pain, MD;  Location: [place];  Service: Cardiovascular;  Laterality: N/A;  . COLONOSCOPY WITH PROPOFOL N/A 07/19/2015   Procedure: COLONOSCOPY WITH PROPOFOL;  Surgeon: Manya Silvas, MD;  Location: [facility] ENDOSCOPY;  Service: Endoscopy;  Laterality: N/A;  . COLONOSCOPY WITH PROPOFOL N/A 01/27/2019   Procedure: COLONOSCOPY WITH PROPOFOL;  Surgeon: Manya Silvas, MD;  Location: [facility] ENDOSCOPY;  Service: Endoscopy;  Laterality: N/A;  . cryo surgery of prostate    . ESOPHAGOGASTRODUODENOSCOPY N/A 01/27/2019   Procedure: ESOPHAGOGASTRODUODENOSCOPY (EGD);  Surgeon: Manya Silvas, MD;  Location: [facility] ENDOSCOPY;  Service: Endoscopy;  Laterality: N/A;  . ESOPHAGOGASTRODUODENOSCOPY (EGD) WITH PROPOFOL N/A 07/19/2015   Procedure: ESOPHAGOGASTRODUODENOSCOPY (EGD) WITH PROPOFOL;  Surgeon: Manya Silvas, MD;  Location: [place];  Service: Endoscopy;  Laterality: N/A;  . JOINT REPLACEMENT     both knees  . LAPAROTOMY N/A 03/08/2015   Procedure: EXPLORATORY LAPAROTOMY;  Surgeon: Autumn Messing III, MD;  Location: [place];  Service: General;  Laterality: N/A;  . LAPAROTOMY N/A 03/09/2015   Procedure: EXPLORATORY LAPAROTOMY AND EVACUATION OF HEMATOMA;  Surgeon: Autumn Messing III, MD;  Location: [place];  Service: General;  Laterality: N/A;  . LYSIS OF ADHESION N/A 03/08/2015   Procedure: LYSIS OF ADHESION;  Surgeon: Autumn Messing III, MD;  Location: [place];  Service: General;  Laterality: N/A;  . OMENTECTOMY N/A 03/08/2015   Procedure: OMENTECTOMY;  Surgeon: Autumn Messing III, MD;  Location: [place];  Service: General;  Laterality: N/A;  . TEE WITHOUT CARDIOVERSION N/A 03/13/2015   Procedure: TRANSESOPHAGEAL ECHOCARDIOGRAM (TEE);  Surgeon: Jerline Pain, MD;  Location: [place];  Service: Cardiovascular;  Laterality: N/A;  . TOTAL KNEE ARTHROPLASTY Left 04/08/2016   Procedure: LEFT TOTAL KNEE ARTHROPLASTY;  Surgeon: Leandrew Koyanagi, MD;  Location: [place];  Service: Orthopedics;  Laterality: Left;  . TOTAL KNEE ARTHROPLASTY Right 12/04/2019   Procedure: RIGHT TOTAL KNEE ARTHROPLASTY;  Surgeon: Leandrew Koyanagi, MD;  Location: [place];  Service: Orthopedics;  Laterality: Right;  . UMBILICAL HERNIA REPAIR  03/08/2015   Procedure: UMBILICAL HERNIA REPAIR  ADULT;  Surgeon: Autumn Messing III, MD;  Location: [place];  Service: General;;  . VENTRAL HERNIA REPAIR N/A 03/08/2015   Procedure:  VENTRAL HERNIA  REPAIR ADULT;  Surgeon: Autumn Messing III, MD;  Location: [place];  Service: General;  Laterality: N/A;   No family history on file. Social History   Tobacco Use  . Smoking status: Former Smoker    Years: 20.00    Types: Cigarettes    Quit date: 08/03/1988    Years since quitting: 32.3  . Smokeless tobacco: Never Used  Vaping Use  . Vaping Use: Never used  Substance Use Topics  . Alcohol use: No  . Drug use: No    Pertinent Clinical Results:  LABS: Labs reviewed: Acceptable for surgery.    Hospital Outpatient Visit on 11/15/2020  Component Date Value Ref Range Status  . Sodium 11/15/2020 139  135 - 145 mmol/L Final  . Potassium 11/15/2020 3.2* 3.5 - 5.1 mmol/L Final  . Chloride 11/15/2020 101  98 - 111 mmol/L Final  . CO2 11/15/2020 32  22 - 32 mmol/L Final  . Glucose, Bld 11/15/2020 116* 70 - 99 mg/dL Final   Glucose reference range applies only to samples taken after fasting for at least 8 hours.  . BUN 11/15/2020 18  8 - 23 mg/dL  Final  . Creatinine, Ser 11/15/2020 1.25* 0.61 - 1.24 mg/dL Final  . Calcium 11/15/2020 8.6* 8.9 - 10.3 mg/dL Final  . GFR, Estimated 11/15/2020 59* >60 mL/min Final   Comment: (NOTE) Calculated using the CKD-EPI Creatinine Equation (2021)   . Anion gap 11/15/2020 6  5 - 15 Final   Performed at [facility], [place], [place] [address]    ECG: Date: 11/15/2020 Time ECG obtained: 1044 AM Rate: 50 bpm Rhythm: Sinus rhythm with first-degree AV block Axis (leads I and aVF): Normal Intervals: PR 254 ms. QRS 90 ms. QTc 393 ms. ST segment and T wave changes: No evidence of acute ST segment elevation or depression; evidence of previous septal MI Comparison: Similar to previous tracing obtained on 12/04/2019   IMAGING / PROCEDURES: CT HEMATURIA WORKUP performed on 10/15/2020  1. Bilateral nonobstructive nephrolithiasis. No other specific cause for hematuria identified 2. Scarring in the right mid kidney laterally and in the left kidney lower pole. 3. Small bilateral hypodense renal lesions are technically too small to characterize although statistically likely to be cysts. 4. Stable bilobed adipose density along the left groin likely a combination of direct and indirect inguinal hernias (pantaloon hernia). 5. Other imaging findings of potential clinical significance:  Coronary atherosclerosis  Stable right adrenal myelolipoma or lipoma  Sigmoid colon diverticulosis  Lumbar spondylosis and degenerative disc disease with multilevel impingement  Postoperative findings along ventral hernia operative site subcutaneous tissues 6. Aortic atherosclerosis.  BILATERAL CAROTID DOPPLER performed on 09/04/2020 1. Mild bilateral carotid atherosclerosis.  2. No hemodynamically significant ICA stenosis.  3. Degree of narrowing less than 50% bilaterally by ultrasound criteria. 4. Patent antegrade vertebral flow bilaterally  MRI BRAIN WITHOUT CONTRAST performed on  08/15/2020 1. Abnormal MRI scan of the brain without contrast showing moderate changes of generalized cerebral atrophy as well as small vessel disease.   2. Small remote infarct is noted in the right temporal region with encephalomalacia and gliosis.   3. Incidental changes of chronic paranasal sinusitis as well with dolichoectasia of the basilar artery are noted.   4. Overall the cerebral atrophy appears to be progressed compared with CT head dated 2016  MRI THORACIC SPINE WITH/WITHOUT CONTRAST performed on 05/23/2018 1. No MRI findings of metastatic disease.  2. The activity on the bone scan appears to correlate with degenerative endplate findings at the T10-11 and T11-12 levels. 3. Multilevel spondylosis and degenerative disc disease, with mild left foraminal impingement at the T10-11 level resulting. 4. Chronically stable benign right adrenal myelolipoma  LEXISCAN performed on 06/26/2015 1. Equivocal ETT based on baseline electrocardiographic changes 2. Regional wall motion reveals normal myocardial thickening and wall motion 3. Left ventricular cavity size normal 4. No artifacts noted 5. Moderately reduced left ventricular function with an EF of 37% 6. No evidence of reversible ischemia  ECHOCARDIOGRAM performed on 06/26/2015 1. LVEF 40% 2. Mild left ventricular systolic dysfunction with moderate LVH 3. Mild AR, MR, TR, and PR 4. No valvular stenosis 5. Mild PHTN 6. Mild LVH  Impression and Plan:  ABDULAZIZ TOMAN has been referred for pre-anesthesia review and clearance prior to him undergoing the planned anesthetic and procedural courses. Available labs, pertinent testing, and imaging results were personally reviewed by me. This patient has been appropriately cleared by cardiology with an overall LOW risk of significant perioperative cardiovascular complications.  Based on clinical review performed today (11/15/20), barring any significant acute changes in the patient's overall  condition, it is anticipated that he will be able to proceed with the planned surgical intervention. Any acute changes in clinical condition may necessitate his procedure being postponed and/or cancelled. Patient will meet with anesthesia team (MD and/or CRNA) on this day of his procedure for preoperative evaluation/assessment.   Pre-surgical instructions were reviewed with the patient during his PAT appointment and questions were fielded by PAT clinical staff. Patient was advised that if any questions or concerns arise prior to his procedure then he should return a call to PAT and/or his surgeon's office to discuss.  Honor Loh, MSN, APRN, FNP-C, CEN [facility]  Peri-operative Services Nurse Practitioner Phone: [phone] 11/15/20 2:30 PM  NOTE: This note has been prepared using Dragon dictation software. Despite my best ability to proofread, there is always the potential that unintentional transcriptional errors may still occur from this  process.

## 2020-11-16 ENCOUNTER — Other Ambulatory Visit: Payer: Self-pay | Admitting: Neurology

## 2020-11-18 ENCOUNTER — Telehealth: Payer: Self-pay | Admitting: *Deleted

## 2020-11-18 NOTE — Telephone Encounter (Signed)
Patient wife called in today and states the [place] did not help.

## 2020-11-19 ENCOUNTER — Encounter: Payer: Self-pay | Admitting: Dermatology

## 2020-11-19 ENCOUNTER — Ambulatory Visit: Payer: PPO | Admitting: Urgent Care

## 2020-11-19 ENCOUNTER — Encounter
Admission: RE | Disposition: A | Discharge status: Home / Self Care | Payer: Self-pay | Source: Home / Self Care | Attending: Urology

## 2020-11-19 ENCOUNTER — Ambulatory Visit
Admission: RE | Admit: 2020-11-19 | Discharge: 2020-11-19 | Disposition: A | Discharge status: Home / Self Care | Payer: PPO | Attending: Urology | Admitting: Urology

## 2020-11-19 ENCOUNTER — Encounter: Payer: Self-pay | Admitting: Urology

## 2020-11-19 DIAGNOSIS — K219 Gastro-esophageal reflux disease without esophagitis: Secondary | ICD-10-CM | POA: Diagnosis not present

## 2020-11-19 DIAGNOSIS — D509 Iron deficiency anemia, unspecified: Secondary | ICD-10-CM | POA: Diagnosis not present

## 2020-11-19 DIAGNOSIS — Z79899 Other long term (current) drug therapy: Secondary | ICD-10-CM | POA: Insufficient documentation

## 2020-11-19 DIAGNOSIS — Z87442 Personal history of urinary calculi: Secondary | ICD-10-CM | POA: Diagnosis not present

## 2020-11-19 DIAGNOSIS — E785 Hyperlipidemia, unspecified: Secondary | ICD-10-CM | POA: Insufficient documentation

## 2020-11-19 DIAGNOSIS — I480 Paroxysmal atrial fibrillation: Secondary | ICD-10-CM | POA: Insufficient documentation

## 2020-11-19 DIAGNOSIS — I110 Hypertensive heart disease with heart failure: Secondary | ICD-10-CM | POA: Diagnosis not present

## 2020-11-19 DIAGNOSIS — M5136 Other intervertebral disc degeneration, lumbar region: Secondary | ICD-10-CM | POA: Diagnosis not present

## 2020-11-19 DIAGNOSIS — M1990 Unspecified osteoarthritis, unspecified site: Secondary | ICD-10-CM | POA: Diagnosis not present

## 2020-11-19 DIAGNOSIS — M47816 Spondylosis without myelopathy or radiculopathy, lumbar region: Secondary | ICD-10-CM | POA: Diagnosis not present

## 2020-11-19 DIAGNOSIS — I252 Old myocardial infarction: Secondary | ICD-10-CM | POA: Diagnosis not present

## 2020-11-19 DIAGNOSIS — Z87891 Personal history of nicotine dependence: Secondary | ICD-10-CM | POA: Insufficient documentation

## 2020-11-19 DIAGNOSIS — N3289 Other specified disorders of bladder: Secondary | ICD-10-CM | POA: Diagnosis not present

## 2020-11-19 DIAGNOSIS — Z955 Presence of coronary angioplasty implant and graft: Secondary | ICD-10-CM | POA: Diagnosis not present

## 2020-11-19 DIAGNOSIS — I700 Atherosclerosis of aorta: Secondary | ICD-10-CM | POA: Diagnosis not present

## 2020-11-19 DIAGNOSIS — N329 Bladder disorder, unspecified: Secondary | ICD-10-CM | POA: Diagnosis not present

## 2020-11-19 DIAGNOSIS — Z7901 Long term (current) use of anticoagulants: Secondary | ICD-10-CM | POA: Insufficient documentation

## 2020-11-19 DIAGNOSIS — F0390 Unspecified dementia without behavioral disturbance: Secondary | ICD-10-CM | POA: Insufficient documentation

## 2020-11-19 DIAGNOSIS — I5030 Unspecified diastolic (congestive) heart failure: Secondary | ICD-10-CM | POA: Insufficient documentation

## 2020-11-19 DIAGNOSIS — I10 Essential (primary) hypertension: Secondary | ICD-10-CM | POA: Diagnosis not present

## 2020-11-19 DIAGNOSIS — I2510 Atherosclerotic heart disease of native coronary artery without angina pectoris: Secondary | ICD-10-CM | POA: Diagnosis not present

## 2020-11-19 DIAGNOSIS — R339 Retention of urine, unspecified: Secondary | ICD-10-CM | POA: Diagnosis not present

## 2020-11-19 HISTORY — DX: Gastro-esophageal reflux disease without esophagitis: K21.9

## 2020-11-19 HISTORY — DX: Unspecified diastolic (congestive) heart failure: I50.30

## 2020-11-19 HISTORY — DX: Iron deficiency anemia, unspecified: D50.9

## 2020-11-19 HISTORY — DX: Other intervertebral disc degeneration, lumbar region: M51.36

## 2020-11-19 HISTORY — DX: Diverticulosis of intestine, part unspecified, without perforation or abscess without bleeding: K57.90

## 2020-11-19 HISTORY — PX: CYSTOSCOPY WITH BIOPSY: SHX5122

## 2020-11-19 HISTORY — DX: Hyperlipidemia, unspecified: E78.5

## 2020-11-19 HISTORY — DX: Paroxysmal atrial fibrillation: I48.0

## 2020-11-19 HISTORY — DX: Other intervertebral disc degeneration, lumbar region without mention of lumbar back pain or lower extremity pain: M51.369

## 2020-11-19 HISTORY — DX: Atherosclerosis of aorta: I70.0

## 2020-11-19 HISTORY — DX: Benign lipomatous neoplasm of other sites: D17.79

## 2020-11-19 HISTORY — DX: Spondylolysis, lumbar region: M43.06

## 2020-11-19 SURGERY — CYSTOSCOPY, WITH BIOPSY
Anesthesia: General

## 2020-11-19 MED ORDER — ACETAMINOPHEN 10 MG/ML IV SOLN
INTRAVENOUS | Status: DC | PRN
  Administered 2020-11-19: 1000 mg via INTRAVENOUS

## 2020-11-19 MED ORDER — DEXAMETHASONE SODIUM PHOSPHATE 10 MG/ML IJ SOLN
INTRAMUSCULAR | Status: DC | PRN
  Administered 2020-11-19: 10 mg via INTRAVENOUS

## 2020-11-19 MED ORDER — SEVOFLURANE IN SOLN
RESPIRATORY_TRACT | Status: AC
  Filled 2020-11-19: qty 250

## 2020-11-19 MED ORDER — FENTANYL CITRATE (PF) 100 MCG/2ML IJ SOLN
INTRAMUSCULAR | Status: AC
  Filled 2020-11-19: qty 2

## 2020-11-19 MED ORDER — FENTANYL CITRATE (PF) 100 MCG/2ML IJ SOLN: 25.0000 ug | INTRAMUSCULAR | Status: DC | PRN

## 2020-11-19 MED ORDER — ORAL CARE MOUTH RINSE: 15.0000 mL | Freq: Once | OROMUCOSAL | Status: AC

## 2020-11-19 MED ORDER — GLYCOPYRROLATE 0.2 MG/ML IJ SOLN
INTRAMUSCULAR | Status: DC | PRN
  Administered 2020-11-19 (×2): .2 mg via INTRAVENOUS

## 2020-11-19 MED ORDER — LACTATED RINGERS IV SOLN: INTRAVENOUS | Status: DC

## 2020-11-19 MED ORDER — ONDANSETRON HCL 4 MG/2ML IJ SOLN
INTRAMUSCULAR | Status: DC | PRN
  Administered 2020-11-19: 4 mg via INTRAVENOUS

## 2020-11-19 MED ORDER — LIDOCAINE HCL (CARDIAC) PF 100 MG/5ML IV SOSY
PREFILLED_SYRINGE | INTRAVENOUS | Status: DC | PRN
  Administered 2020-11-19: 100 mg via INTRAVENOUS

## 2020-11-19 MED ORDER — PROPOFOL 10 MG/ML IV BOLUS
INTRAVENOUS | Status: DC | PRN
  Administered 2020-11-19: 150 mg via INTRAVENOUS

## 2020-11-19 MED ORDER — CEFAZOLIN SODIUM-DEXTROSE 2-4 GM/100ML-% IV SOLN
2.0000 g | INTRAVENOUS | Status: AC
  Administered 2020-11-19: 2 g via INTRAVENOUS

## 2020-11-19 MED ORDER — EPHEDRINE SULFATE 50 MG/ML IJ SOLN
INTRAMUSCULAR | Status: DC | PRN
  Administered 2020-11-19 (×2): 10 mg via INTRAVENOUS

## 2020-11-19 MED ORDER — ONDANSETRON HCL 4 MG/2ML IJ SOLN: 4.0000 mg | Freq: Once | INTRAMUSCULAR | Status: DC | PRN

## 2020-11-19 MED ORDER — CHLORHEXIDINE GLUCONATE 0.12 % MT SOLN
15.0000 mL | Freq: Once | OROMUCOSAL | Status: AC
  Administered 2020-11-19: 15 mL via OROMUCOSAL

## 2020-11-19 MED ORDER — CHLORHEXIDINE GLUCONATE 0.12 % MT SOLN
OROMUCOSAL | Status: AC
  Filled 2020-11-19: qty 15

## 2020-11-19 MED ORDER — FENTANYL CITRATE (PF) 100 MCG/2ML IJ SOLN
INTRAMUSCULAR | Status: DC | PRN
  Administered 2020-11-19: 50 ug via INTRAVENOUS

## 2020-11-19 MED ORDER — ACETAMINOPHEN 10 MG/ML IV SOLN
INTRAVENOUS | Status: AC
  Filled 2020-11-19: qty 100

## 2020-11-19 MED ORDER — CEFAZOLIN SODIUM-DEXTROSE 2-4 GM/100ML-% IV SOLN
INTRAVENOUS | Status: AC
  Filled 2020-11-19: qty 100

## 2020-11-19 MED ORDER — PROPOFOL 10 MG/ML IV BOLUS
INTRAVENOUS | Status: AC
  Filled 2020-11-19: qty 20

## 2020-11-19 SURGICAL SUPPLY — 18 items
BAG DRAIN CYSTO-URO LG1000N (MISCELLANEOUS) ×2 IMPLANT
BRUSH SCRUB EZ 1% IODOPHOR (MISCELLANEOUS) ×2 IMPLANT
DRSG TELFA 4X3 1S NADH ST (GAUZE/BANDAGES/DRESSINGS) ×2 IMPLANT
ELECT REM PT RETURN 9FT ADLT (ELECTROSURGICAL) ×2
ELECTRODE REM PT RTRN 9FT ADLT (ELECTROSURGICAL) ×1 IMPLANT
GLOVE SURG UNDER POLY LF SZ7.5 (GLOVE) ×2 IMPLANT
GOWN STRL REUS W/ TWL LRG LVL3 (GOWN DISPOSABLE) ×2 IMPLANT
GOWN STRL REUS W/ TWL XL LVL3 (GOWN DISPOSABLE) ×1 IMPLANT
GOWN STRL REUS W/TWL LRG LVL3 (GOWN DISPOSABLE) ×4
GOWN STRL REUS W/TWL XL LVL3 (GOWN DISPOSABLE) ×2
KIT TURNOVER CYSTO (KITS) ×2 IMPLANT
NDL SAFETY ECLIPSE 18X1.5 (NEEDLE) ×1 IMPLANT
NEEDLE HYPO 18GX1.5 SHARP (NEEDLE) ×2
PACK CYSTO AR (MISCELLANEOUS) ×2 IMPLANT
SET CYSTO W/LG BORE CLAMP LF (SET/KITS/TRAYS/PACK) ×2 IMPLANT
SURGILUBE 2OZ TUBE FLIPTOP (MISCELLANEOUS) ×2 IMPLANT
WATER STERILE IRR 1000ML POUR (IV SOLUTION) ×2 IMPLANT
WATER STERILE IRR 3000ML UROMA (IV SOLUTION) ×2 IMPLANT

## 2020-11-19 NOTE — Interval H&P Note (Signed)
History and Physical Interval Note:  11/19/2020 8:55 AM  Jim Dodson  has presented today for surgery, with the diagnosis of Bladder Lesion.  The various methods of treatment have been discussed with the patient and family. After consideration of risks, benefits and other options for treatment, the patient has consented to  Procedure(s): CYSTOSCOPY WITH POSSIBLE BLADDER BIOPSY (N/A) as a surgical intervention.  The patient's history has been reviewed, patient examined, no change in status, stable for surgery.  I have reviewed the patient's chart and labs.  Questions were answered to the patient's satisfaction.     [place]

## 2020-11-19 NOTE — Anesthesia Procedure Notes (Signed)
Procedure Name: LMA Insertion Date/Time: 11/19/2020 9:02 AM Performed by: Lily Peer, Lei Dower, CRNA Pre-anesthesia Checklist: Patient identified, Emergency Drugs available, Suction available and Patient being monitored Patient Re-evaluated:Patient Re-evaluated prior to induction Oxygen Delivery Method: Circle system utilized Preoxygenation: Pre-oxygenation with 100% oxygen Induction Type: IV induction Ventilation: Mask ventilation without difficulty LMA: LMA inserted LMA Size: 4.0 Number of attempts: 1 Placement Confirmation: positive ETCO2 and breath sounds checked- equal and bilateral Tube secured with: Tape Dental Injury: Teeth and Oropharynx as per pre-operative assessment

## 2020-11-19 NOTE — Op Note (Signed)
Preoperative diagnosis:  1. Bladder lesion  Postoperative diagnosis:  1. Bladder lesion  Procedure: 1. Cystoscopy with biopsy/fulguration of bladder related  Surgeon: Abbie Sons, MD  Anesthesia: General  Complications: None  Intraoperative findings:  1.  Cystoscopy-urethra with wide caliber bulbar strictures which did not impede passage of the 21 French cystoscope.  Prostate with mild lateral lobe enlargement and moderate bladder neck elevation.  Ureteral orifices normal-appearing with clear efflux.  Bladder mucosa without solid or papillary lesions.  On the left bladder base posterior to the left trigone was an ~ 5 mm erythematous lesion with slightly raised mucosa.  This did not appear to be a diverticulum previously mentioned in office cystoscopy note.  EBL: Minimal  Specimens: Bladder biopsy  Indication: Jim Dodson is a 80 y.o. male with chronic storage related voiding symptoms and microhematuria.  Recent office cystoscopy remarkable for an area on the left posterior wall which was erythematous and possibly a diverticulum.  After reviewing the management options for treatment, he elected to proceed with the above surgical procedure(s). We have discussed the potential benefits and risks of the procedure, side effects of the proposed treatment, the likelihood of the patient achieving the goals of the procedure, and any potential problems that might occur during the procedure or recuperation. Informed consent has been obtained.  Description of procedure:  The patient was taken to the operating room and general anesthesia was induced.  The patient was placed in the dorsal lithotomy position, prepped and draped in the usual sterile fashion, and preoperative antibiotics were administered. A preoperative time-out was performed.   A 21 French cystoscope was lubricated, passed per urethra and advanced into the bladder under direct vision with findings as described above.  The  erythematous lesion was identified as described above.  Cold biopsies x2 were taken which remove the lesion in its entirety.  Biopsy site was then fulgurated with a Bugbee electrode. Hemostasis was adequate and the cystoscope was removed.  After anesthetic reversal the patient was transported to PACU in stable condition.  Plan:  He will be contacted with the pathology results and further follow-up recommendations at that time.   Abbie Sons, M.D.

## 2020-11-19 NOTE — Interval H&P Note (Signed)
History and Physical Interval Note:  11/19/2020 8:54 AM  Jim Dodson  has presented today for surgery, with the diagnosis of Bladder Lesion.  The various methods of treatment have been discussed with the patient and family. After consideration of risks, benefits and other options for treatment, the patient has consented to  Procedure(s): CYSTOSCOPY WITH POSSIBLE BLADDER BIOPSY (N/A) as a surgical intervention.  The patient's history has been reviewed, patient examined, no change in status, stable for surgery.  I have reviewed the patient's chart and labs.  Questions were answered to the patient's satisfaction.     [name]

## 2020-11-19 NOTE — Anesthesia Preprocedure Evaluation (Signed)
Anesthesia Evaluation  Patient identified by MRN, date of birth, ID band Patient awake    Reviewed: Allergy & Precautions, H&P , NPO status , Patient's Chart, lab work & pertinent test results  History of Anesthesia Complications Negative for: history of anesthetic complications  Airway Mallampati: III  TM Distance: >3 FB     Dental  (+) Chipped, Dental Advidsory Given, Partial Lower, Missing   Pulmonary shortness of breath and with exertion, neg sleep apnea, neg COPD, neg recent URI, former smoker,           Cardiovascular hypertension, (-) angina+ CAD, + Past MI and + Cardiac Stents (2009)  + dysrhythmias Atrial Fibrillation (-) Valvular Problems/Murmurs     Neuro/Psych  Headaches, neg Seizures PSYCHIATRIC DISORDERS Dementia    GI/Hepatic Neg liver ROS, hiatal hernia, GERD  ,  Endo/Other  negative endocrine ROS  Renal/GU Renal disease (stones)  negative genitourinary   Musculoskeletal   Abdominal   Peds  Hematology negative hematology ROS (+)   Anesthesia Other Findings Obese  Past Medical History: No date: A-fib No date: Arthritis No date: Atrial fibrillation ([place]) No date: Coronary artery disease     Comment:  stent placed 2009 No date: Headache No date: History of hiatal hernia No date: History of kidney stones No date: Hypertension No date: Prostate cancer [facility])     Comment:  prostate cancer, had sx No date: Shortness of breath dyspnea  Past Surgical History: No date: CARDIAC CATHETERIZATION     Comment:  2009, stent placed 03/13/2015: CARDIOVERSION; N/A     Comment:  Procedure: CARDIOVERSION;  Surgeon: Jerline Pain, MD;                Location: [place];  Service: Cardiovascular;                Laterality: N/A; 07/19/2015: COLONOSCOPY WITH PROPOFOL; N/A     Comment:  Procedure: COLONOSCOPY WITH PROPOFOL;  Surgeon: Manya Silvas, MD;  Location: [facility] ENDOSCOPY;  Service:                Endoscopy;  Laterality: N/A; No date: cryo surgery of prostate 07/19/2015: ESOPHAGOGASTRODUODENOSCOPY (EGD) WITH PROPOFOL; N/A     Comment:  Procedure: ESOPHAGOGASTRODUODENOSCOPY (EGD) WITH               PROPOFOL;  Surgeon: Manya Silvas, MD;  Location: [place];  Service: Endoscopy;  Laterality: N/A; 03/08/2015: LAPAROTOMY; N/A     Comment:  Procedure: EXPLORATORY LAPAROTOMY;  Surgeon: Autumn Messing               III, MD;  Location: [place];  Service: General;                Laterality: N/A; 03/09/2015: LAPAROTOMY; N/A     Comment:  Procedure: EXPLORATORY LAPAROTOMY AND EVACUATION OF               HEMATOMA;  Surgeon: Autumn Messing III, MD;  Location: [place];               Service: General;  Laterality: N/A; 03/08/2015: LYSIS OF ADHESION; N/A     Comment:  Procedure: LYSIS OF ADHESION;  Surgeon: Autumn Messing III,               MD;  Location: [place];  Service: General;  Laterality:               N/A; 03/08/2015: OMENTECTOMY; N/A     Comment:  Procedure: OMENTECTOMY;  Surgeon: Autumn Messing III, MD;                Location: [place];  Service: General;  Laterality: N/A; 03/13/2015: TEE WITHOUT CARDIOVERSION; N/A     Comment:  Procedure: TRANSESOPHAGEAL ECHOCARDIOGRAM (TEE);                Surgeon: Jerline Pain, MD;  Location: [place];                Service: Cardiovascular;  Laterality: N/A; 04/08/2016: TOTAL KNEE ARTHROPLASTY; Left     Comment:  Procedure: LEFT TOTAL KNEE ARTHROPLASTY;  Surgeon:               Leandrew Koyanagi, MD;  Location: [place];  Service:               Orthopedics;  Laterality: Left; 12/07/4330: UMBILICAL HERNIA REPAIR     Comment:  Procedure: UMBILICAL HERNIA REPAIR  ADULT;  Surgeon:               Autumn Messing III, MD;  Location: [place];  Service: General;; 03/08/2015: VENTRAL HERNIA REPAIR; N/A     Comment:  Procedure:  VENTRAL HERNIA  REPAIR ADULT;  Surgeon: Autumn Messing III, MD;  Location: [place];  Service: General;                Laterality: N/A;  BMI     Body Mass Index: 34.95 kg/m      Reproductive/Obstetrics negative OB ROS                             Anesthesia Physical  Anesthesia Plan  ASA: III  Anesthesia Plan: General   Post-op Pain Management:    Induction: Intravenous  PONV Risk Score and Plan: Ondansetron, Dexamethasone and Treatment may vary due to age or medical condition  Airway Management Planned: LMA  Additional Equipment:   Intra-op Plan:   Post-operative Plan:   Informed Consent: I have reviewed the patients History and Physical, chart, labs and discussed the procedure including the risks, benefits and alternatives for the proposed anesthesia with the patient or authorized representative who has indicated his/her understanding and acceptance.     Dental Advisory Given  Plan Discussed with: Anesthesiologist and CRNA  Anesthesia Plan Comments:         Anesthesia Quick Evaluation

## 2020-11-19 NOTE — Discharge Instructions (Signed)
Bladder Biopsy    General instructions:     Your recent bladder surgery requires very little post hospital care but some definite precautions.  Despite the fact that no skin incisions were used, the area around the bladder incisions are raw and covered with scabs to promote healing and prevent bleeding. Certain precautions are needed to insure that the scabs are not disturbed over the next 2-4 weeks while the healing proceeds.  Because the raw surface inside your bladder and the irritating effects of urine you may expect frequency of urination and/or urgency (a stronger desire to urinate) and perhaps even getting up at night more often. This will usually resolve or improve slowly over the healing period. You may see some blood in your urine over the first 6 weeks. Do not be alarmed, even if the urine was clear for a while. Get off your feet and drink lots of fluids until clearing occurs. If you start to pass clots or don't improve call us.  Diet:  You may return to your normal diet immediately. Because of the raw surface of your bladder, alcohol, spicy foods, foods high in acid and drinks with caffeine may cause irritation or frequency and should be used in moderation. To keep your urine flowing freely and avoid constipation, drink plenty of fluids during the day (8-10 glasses). Tip: Avoid cranberry juice because it is very acidic.  Activity:  Your physical activity doesn't need to be restricted. However, if you are very active, you may see some blood in the urine. We suggest that you reduce your activity under the circumstances until the bleeding has stopped.  Bowels:  It is important to keep your bowels regular during the postoperative period. Straining with bowel movements can cause bleeding. A bowel movement every other day is reasonable. Use a mild laxative if needed, such as milk of magnesia 2-3 tablespoons, or 2 Dulcolax tablets. Call if you continue to have problems. If you had been  taking narcotics for pain, before, during or after your surgery, you may be constipated. Take a laxative if necessary.    Medication:  You should resume your pre-surgery medications unless told not to. In addition you may be given an antibiotic to prevent or treat infection. Antibiotics are not always necessary. All medication should be taken as prescribed until the bottles are finished unless you are having an unusual reaction to one of the drugs.  You may resume Eliquis 11/20/2020  [facility] [address] [place], [place] [address] [phone]  AMBULATORY SURGERY  DISCHARGE INSTRUCTIONS   1) The drugs that you were given will stay in your system until tomorrow so for the next 24 hours you should not:  A) Drive an automobile B) Make any legal decisions C) Drink any alcoholic beverage   2) You may resume regular meals tomorrow.  Today it is better to start with liquids and gradually work up to solid foods.  You may eat anything you prefer, but it is better to start with liquids, then soup and crackers, and gradually work up to solid foods.   3) Please notify your doctor immediately if you have any unusual bleeding, trouble breathing, redness and pain at the surgery site, drainage, fever, or pain not relieved by medication.    4) Additional Instructions:        Please contact your physician with any problems or Same Day Surgery at [phone], Monday through Friday 6 am to 4 pm, or [place] at [facility]  Main number at [phone].

## 2020-11-19 NOTE — Transfer of Care (Signed)
Immediate Anesthesia Transfer of Care Note  Patient: Jim Dodson  Procedure(s) Performed: CYSTOSCOPY WITH POSSIBLE BLADDER BIOPSY (N/A )  Patient Location: PACU  Anesthesia Type:General  Level of Consciousness: drowsy  Airway & Oxygen Therapy: Patient Spontanous Breathing and Patient connected to face mask oxygen  Post-op Assessment: Report given to RN and Post -op Vital signs reviewed and stable  Post vital signs: Reviewed and stable  Last Vitals:  Vitals Value Taken Time  BP 106/65 11/19/20 0924  Temp    Pulse 65 11/19/20 0926  Resp 11 11/19/20 0926  SpO2 98 % 11/19/20 0926  Vitals shown include unvalidated device data.  Last Pain:  Vitals:   11/19/20 0752  PainSc: 4          Complications: No complications documented.

## 2020-11-20 LAB — SURGICAL PATHOLOGY

## 2020-11-20 NOTE — Anesthesia Postprocedure Evaluation (Signed)
Anesthesia Post Note  Patient: Jim Dodson  Procedure(s) Performed: CYSTOSCOPY WITH POSSIBLE BLADDER BIOPSY (N/A )  Patient location during evaluation: PACU Anesthesia Type: General Level of consciousness: awake and alert Pain management: pain level controlled Vital Signs Assessment: post-procedure vital signs reviewed and stable Respiratory status: spontaneous breathing, nonlabored ventilation, respiratory function stable and patient connected to nasal cannula oxygen Cardiovascular status: blood pressure returned to baseline and stable Postop Assessment: no apparent nausea or vomiting Anesthetic complications: no   No complications documented.   Last Vitals:  Vitals:   11/19/20 1000 11/19/20 1013  BP:  (!) 149/77  Pulse:  63  Resp:  18  Temp: (!) 36.1 C 36.6 C  SpO2:  96%    Last Pain:  Vitals:   11/19/20 1013  TempSrc: Temporal  PainSc: 0-No pain                 Martha Clan

## 2020-11-21 ENCOUNTER — Encounter: Payer: Self-pay | Admitting: Urology

## 2020-12-03 ENCOUNTER — Ambulatory Visit (INDEPENDENT_AMBULATORY_CARE_PROVIDER_SITE_OTHER): Payer: PPO | Admitting: Orthopaedic Surgery

## 2020-12-03 ENCOUNTER — Encounter: Payer: Self-pay | Admitting: Orthopaedic Surgery

## 2020-12-03 ENCOUNTER — Ambulatory Visit (INDEPENDENT_AMBULATORY_CARE_PROVIDER_SITE_OTHER): Payer: PPO

## 2020-12-03 ENCOUNTER — Other Ambulatory Visit: Payer: Self-pay

## 2020-12-03 VITALS — Ht 65.0 in | Wt 214.0 lb

## 2020-12-03 DIAGNOSIS — Z96651 Presence of right artificial knee joint: Secondary | ICD-10-CM

## 2020-12-03 NOTE — Progress Notes (Signed)
Post-Op Visit Note   Patient: Jim Dodson           Date of Birth: [birth year]           MRN: [id number] Visit Date: 12/03/2020 PCP: Shon Baton, MD   Assessment & Plan:  Chief Complaint:  Chief Complaint  Patient presents with  . Right Knee - Follow-up    Right total knee arthroplasty 12/04/2019   Visit Diagnoses:  1. Hx of total knee replacement, right     Plan:   Jim Dodson is about 1 year status post right total knee replacement.  He has no complaints.  He is ambulating in public with a cane at times.  He has resumed all daily activities without any problems.  No complaints.  He is 5 years status post left total knee replacement and doing well from this as well.  Bilateral knees show fully healed surgical scars with excellent range of motion without pain.  Stable to varus valgus.  X-rays are unremarkable.  Jim Dodson is 1 year status post right total knee replacement.  He has done very well from this.  He will continue with activities as tolerated.  For the left knee replacement standpoint he can follow-up with [place] as needed.  We will see him back in a year with two-view x-rays of the right knee for his 2-year visit.   Follow-Up Instructions: Return in about 1 year (around 12/03/2021).   Orders:  Orders Placed This Encounter  Procedures  . XR Knee 1-2 Views Right   No orders of the defined types were placed in this encounter.   Imaging: XR Knee 1-2 Views Right  Result Date: 12/03/2020 Stable total knee replacement in good alignment.  Stable left total knee replacement without complications.   PMFS History: Patient Active Problem List   Diagnosis Date Noted  . Memory loss 08/13/2020  . Vitamin D deficiency 08/13/2020  . Dementia with behavioral disturbance ([place]) 08/13/2020  . Status post total right knee replacement 12/04/2019  . Primary osteoarthritis of right knee 08/16/2019  . Iron deficiency anemia, unspecified 02/09/2019  . Ischemic colitis ([place])  02/09/2019  . Hyperlipidemia 04/20/2018  . Total knee replacement status 04/08/2016  . Abnormal finding of diagnostic imaging 02/28/2016  . Sepsis ([place]) 07/25/2015  . On continuous oral anticoagulation 07/25/2015  . Essential hypertension 07/25/2015  . GERD (gastroesophageal reflux disease) 07/25/2015  . Acute upper respiratory infection 07/25/2015  . Bladder outlet obstruction 05/28/2015  . Incarcerated ventral hernia 03/14/2015  . Atrial fibrillation with rapid ventricular response ([place])   . Chronic coronary artery disease   . Elevated troponin   . Patient receiving intravenous heparin therapy   . SBO (small bowel obstruction) ([place]) 03/08/2015  . Male urinary stress incontinence 08/17/2012  . ED (erectile dysfunction) of organic origin 03/30/2012  . Incomplete emptying of bladder 03/30/2012  . Inguinal hernia 03/30/2012  . Kidney stone 03/30/2012  . Malignant neoplasm of prostate ([place]) 03/30/2012   Past Medical History:  Diagnosis Date  . Actinic keratosis   . Aortic atherosclerosis ([place])   . Arthritis   . Coronary artery disease   . COVID-19   . DDD (degenerative disc disease), lumbar    with associated multilevel impingement  . Diastolic heart failure ([place])   . Diverticulosis   . GERD (gastroesophageal reflux disease)   . Headache   . History of hiatal hernia   . History of kidney stones   . HLD (hyperlipidemia)   . Hypertension   .  IDA (iron deficiency anemia)   . Lumbar spondylolysis   . Myelolipoma of right adrenal gland   . Paroxysmal atrial fibrillation ([facility])   . Prostate cancer ([place])   . Seborrheic dermatitis   . Shortness of breath dyspnea   . Subendocardial myocardial infarction [facility]) 07/2009   PCI with DES x 1 placed to LAD    History reviewed. No pertinent family history.  Past Surgical History:  Procedure Laterality Date  . CARDIAC CATHETERIZATION     2009, stent placed  . CARDIOVERSION N/A 03/13/2015   Procedure: CARDIOVERSION;  Surgeon: Jerline Pain, MD;  Location: [place];  Service: Cardiovascular;  Laterality: N/A;  . COLONOSCOPY WITH PROPOFOL N/A 07/19/2015   Procedure: COLONOSCOPY WITH PROPOFOL;  Surgeon: Manya Silvas, MD;  Location[facility] ENDOSCOPY;  Service: Endoscopy;  Laterality: N/A;  . COLONOSCOPY WITH PROPOFOL N/A 01/27/2019   Procedure: COLONOSCOPY WITH PROPOFOL;  Surgeon: Manya Silvas, MD;  Location: [facility] ENDOSCOPY;  Service: Endoscopy;  Laterality: N/A;  . cryo surgery of prostate    . CYSTOSCOPY WITH BIOPSY N/A 11/19/2020   Procedure: CYSTOSCOPY WITH POSSIBLE BLADDER BIOPSY;  Surgeon: Abbie Sons, MD;  Location: [facility];  Service: Urology;  Laterality: N/A;  . ESOPHAGOGASTRODUODENOSCOPY N/A 01/27/2019   Procedure: ESOPHAGOGASTRODUODENOSCOPY (EGD);  Surgeon: Manya Silvas, MD;  Location: [facility] ENDOSCOPY;  Service: Endoscopy;  Laterality: N/A;  . ESOPHAGOGASTRODUODENOSCOPY (EGD) WITH PROPOFOL N/A 07/19/2015   Procedure: ESOPHAGOGASTRODUODENOSCOPY (EGD) WITH PROPOFOL;  Surgeon: Manya Silvas, MD;  Location: [facility] ENDOSCOPY;  Service: Endoscopy;  Laterality: N/A;  . JOINT REPLACEMENT     both knees  . LAPAROTOMY N/A 03/08/2015   Procedure: EXPLORATORY LAPAROTOMY;  Surgeon: Autumn Messing III, MD;  Location: [place];  Service: General;  Laterality: N/A;  . LAPAROTOMY N/A 03/09/2015   Procedure: EXPLORATORY LAPAROTOMY AND EVACUATION OF HEMATOMA;  Surgeon: Autumn Messing III, MD;  Location: [place];  Service: General;  Laterality: N/A;  . LYSIS OF ADHESION N/A 03/08/2015   Procedure: LYSIS OF ADHESION;  Surgeon: Autumn Messing III, MD;  Location: [place];  Service: General;  Laterality: N/A;  . OMENTECTOMY N/A 03/08/2015   Procedure: OMENTECTOMY;  Surgeon: Autumn Messing III, MD;  Location: [place];  Service: General;  Laterality: N/A;  . TEE WITHOUT CARDIOVERSION N/A 03/13/2015   Procedure: TRANSESOPHAGEAL ECHOCARDIOGRAM (TEE);  Surgeon: Jerline Pain, MD;  Location: [place];  Service: Cardiovascular;  Laterality: N/A;  . TOTAL KNEE  ARTHROPLASTY Left 04/08/2016   Procedure: LEFT TOTAL KNEE ARTHROPLASTY;  Surgeon: Leandrew Koyanagi, MD;  Location: [place];  Service: Orthopedics;  Laterality: Left;  . TOTAL KNEE ARTHROPLASTY Right 12/04/2019   Procedure: RIGHT TOTAL KNEE ARTHROPLASTY;  Surgeon: Leandrew Koyanagi, MD;  Location: [place];  Service: Orthopedics;  Laterality: Right;  . UMBILICAL HERNIA REPAIR  03/08/2015   Procedure: UMBILICAL HERNIA REPAIR  ADULT;  Surgeon: Autumn Messing III, MD;  Location: [place];  Service: General;;  . VENTRAL HERNIA REPAIR N/A 03/08/2015   Procedure:  VENTRAL HERNIA  REPAIR ADULT;  Surgeon: Autumn Messing III, MD;  Location: [place];  Service: General;  Laterality: N/A;   Social History   Occupational History  . Not on file  Tobacco Use  . Smoking status: Former Smoker    Years: 20.00    Types: Cigarettes    Quit date: 08/03/1988    Years since quitting: 32.3  . Smokeless tobacco: Never Used  Vaping Use  . Vaping Use: Never used  Substance  and Sexual Activity  . Alcohol use: No  . Drug use: No  . Sexual activity: Yes

## 2020-12-10 ENCOUNTER — Other Ambulatory Visit: Payer: Self-pay | Admitting: Neurology

## 2020-12-10 DIAGNOSIS — D509 Iron deficiency anemia, unspecified: Secondary | ICD-10-CM | POA: Diagnosis not present

## 2020-12-10 DIAGNOSIS — K559 Vascular disorder of intestine, unspecified: Secondary | ICD-10-CM | POA: Diagnosis not present

## 2020-12-10 NOTE — Telephone Encounter (Signed)
Called wife. He is tolerating donepezil 5mg  tab well. Aware I will send in 10mg  now for him to take po qhs.

## 2020-12-11 ENCOUNTER — Encounter: Payer: Self-pay | Admitting: Neurology

## 2020-12-11 ENCOUNTER — Ambulatory Visit: Payer: PPO | Admitting: Neurology

## 2020-12-11 VITALS — BP 121/65 | HR 49 | Ht 65.0 in | Wt 217.0 lb

## 2020-12-11 DIAGNOSIS — G309 Alzheimer's disease, unspecified: Secondary | ICD-10-CM | POA: Diagnosis not present

## 2020-12-11 DIAGNOSIS — F0280 Dementia in other diseases classified elsewhere without behavioral disturbance: Secondary | ICD-10-CM | POA: Insufficient documentation

## 2020-12-11 DIAGNOSIS — F0391 Unspecified dementia with behavioral disturbance: Secondary | ICD-10-CM | POA: Diagnosis not present

## 2020-12-11 DIAGNOSIS — R413 Other amnesia: Secondary | ICD-10-CM

## 2020-12-11 DIAGNOSIS — S0291XS Unspecified fracture of skull, sequela: Secondary | ICD-10-CM

## 2020-12-11 NOTE — Progress Notes (Signed)
[facility]  PATIENT: Jim Dodson DOB: 09-24-1940  REFERRING DOCTOR OR PCP: Jim Baton, MD SOURCE: Patient, wife, notes from Dr. Virgina Jock, imaging and laboratory reports, CT scan personally reviewed.  _________________________________   HISTORICAL  CHIEF COMPLAINT:  Chief Complaint  Patient presents with  . Follow-up    RM 12 w/ wife. Last seen 08/13/20. Last [facility] 16/30. On donepezil. Denies any new sx, stable.    HISTORY OF PRESENT ILLNESS:  [name] is a 80 y.o. man with dementia his difficulties with memory loss.  Update 12/11/2020 He feels his memory is fine and he denies any problems.   His wife notes gradual change in memory with some more acute changes in behavior in 2020.     MRI 08/15/2020 showed right temporal encephalomalacia (could be posttraumatic or secondary to stroke) superimposed chronic microvascular ischemic change and atrophy, most severe in the temporal lobes.  Of note, the CT scan from 2016 did appear to show some hypoattenuation changes in the right temporal lobe.  I discussed his dementia is likely due to the combination of the temporal encephalomalacia (from a CVA or maybe a head injury many years ago with skull fx) on the right and the atrophy that is consistent with Alzheimer's disease.Marland Kitchen   He does word search puzzles and watch TV most days.  He does simple chores around the house.    He was never on donepezil but has been on memantine, initially 5 mg and now 10 mg po bid.   His wife does not feel it did not help.   He has some difficulty with gait and uses a cane.     Got lost while driving earlier this year no longer drives.  His wife takes care of the family finances.   Montreal Cognitive Assessment  08/13/2020  Visuospatial/ Executive (0/5) 3  Naming (0/3) 3  Attention: Read list of digits (0/2) 2  Attention: Read list of letters (0/1) 0  Attention: Serial 7 subtraction starting at 100 (0/3) 0  Language: Repeat phrase  (0/2) 2  Language : Fluency (0/1) 0  Abstraction (0/2) 1  Delayed Recall (0/5) 0  Orientation (0/6) 5  Total 16  Adjusted Score (based on education) 16     REVIEW OF SYSTEMS: Constitutional: No fevers, chills, sweats, or change in appetite.  He sleeps well.   Eyes: No visual changes, double vision, eye pain Ear, nose and throat: No hearing loss, ear pain, nasal congestion, sore throat Cardiovascular: No chest pain, palpitations Respiratory: No shortness of breath at rest or with exertion.   No wheezes GastrointestinaI: No nausea, vomiting, diarrhea, abdominal pain, fecal incontinence Genitourinary: No dysuria, urinary retention or frequency.  No nocturia. Musculoskeletal: No neck pain, back pain.  He has a recent knee replacement Integumentary: No rash, pruritus, skin lesions Neurological: as above Psychiatric: No depression at this time.  No anxiety Endocrine: No palpitations, diaphoresis, change in appetite, change in weigh or increased thirst Hematologic/Lymphatic: No anemia, purpura, petechiae. Allergic/Immunologic: No itchy/runny eyes, nasal congestion, recent allergic reactions, rashes  ALLERGIES: Allergies  Allergen Reactions  . Statins Swelling and Other (See Comments)    Legs swelled up and cramped up really bad    HOME MEDICATIONS:  Current Outpatient Medications:  .  apixaban (ELIQUIS) 5 MG TABS tablet, Take 5 mg by mouth 2 (two) times daily., Disp: , Rfl:  .  Carboxymethylcellul-Glycerin (REFRESH OPTIVE PF OP), Place 1 drop into both eyes 2 (two) times daily as needed (dry eyes).,  Disp: , Rfl:  .  Chlorpheniramine Maleate (ALLERGY RELIEF PO), Take 1 Dose by mouth daily. Gets [employer] brand, Disp: , Rfl:  .  Cholecalciferol (VITAMIN D3) 5000 units TABS, Take 5,000 Units by mouth daily. , Disp: , Rfl:  .  donepezil (ARICEPT) 10 MG tablet, Take 1 tablet (10 mg total) by mouth at bedtime., Disp: 30 tablet, Rfl: 11 .  ferrous sulfate 325 (65 FE) MG tablet,  Take 325 mg by mouth in the morning and at bedtime. , Disp: , Rfl:  .  hydrALAZINE (APRESOLINE) 25 MG tablet, Take 25 mg by mouth 3 (three) times daily., Disp: , Rfl:  .  hydrochlorothiazide (HYDRODIURIL) 25 MG tablet, Take 25 mg by mouth daily., Disp: , Rfl:  .  hydrocortisone 2.5 % lotion, Apply 1 application topically every Monday, Wednesday, and Friday. At night, Disp: , Rfl:  .  hydrocortisone 2.5 % lotion, Apply topically as directed. Apply to scaly areas on face and chest 3 nights per week Monday, Wednesday and fridays prn flares, Disp: 59 mL, Rfl: 11 .  ketoconazole (NIZORAL) 2 % cream, Apply 1 application topically 3 (three) times a week. Tues, Thurs, Sat at night, Disp: , Rfl:  .  ketoconazole (NIZORAL) 2 % cream, Apply 1 application topically as directed. Apply to face and chest 3 nights per week, Tuesday, Thursday and Saturday, Disp: 60 g, Rfl: 11 .  Loperamide HCl (ANTI-DIARRHEAL PO), Take 1 Dose by mouth as needed. Gets [employer] brand, Disp: , Rfl:  .  meclizine (ANTIVERT) 12.5 MG tablet, Take 12.5 mg by mouth daily., Disp: , Rfl:  .  memantine (NAMENDA) 10 MG tablet, Take 10 mg by mouth 2 (two) times daily. , Disp: , Rfl:  .  metoprolol (LOPRESSOR) 100 MG tablet, Take 1 tablet (100 mg total) by mouth 2 (two) times daily., Disp: 60 tablet, Rfl: 0 .  MYRBETRIQ 50 MG TB24 tablet, TAKE 1 TABLET BY MOUTH DAILY (Patient taking differently: Take 50 mg by mouth daily.), Disp: 30 tablet, Rfl: 11 .  omeprazole (PRILOSEC) 20 MG capsule, Take 20 mg by mouth daily., Disp: , Rfl:  .  Red Yeast Rice Extract 600 MG CAPS, Take 600 mg by mouth 2 (two) times daily. , Disp: , Rfl:  .  tamsulosin (FLOMAX) 0.4 MG CAPS capsule, TAKE 1 CAPSULE BY MOUTH TWICE DAILY (Patient taking differently: Take 0.4 mg by mouth daily.), Disp: 60 capsule, Rfl: 11  PAST MEDICAL HISTORY: Past Medical History:  Diagnosis Date  . Actinic keratosis   . Aortic atherosclerosis ([place])   . Arthritis   . Coronary artery  disease   . COVID-19   . DDD (degenerative disc disease), lumbar    with associated multilevel impingement  . Diastolic heart failure ([place])   . Diverticulosis   . GERD (gastroesophageal reflux disease)   . Headache   . History of hiatal hernia   . History of kidney stones   . HLD (hyperlipidemia)   . Hypertension   . IDA (iron deficiency anemia)   . Lumbar spondylolysis   . Myelolipoma of right adrenal gland   . Paroxysmal atrial fibrillation (HCC)   . Prostate cancer ([place])   . Seborrheic dermatitis   . Shortness of breath dyspnea   . Subendocardial myocardial infarction [facility]) 07/2009   PCI with DES x 1 placed to LAD    PAST SURGICAL HISTORY: Past Surgical History:  Procedure Laterality Date  . CARDIAC CATHETERIZATION     2009, stent placed  .  CARDIOVERSION N/A 03/13/2015   Procedure: CARDIOVERSION;  Surgeon: Jerline Pain, MD;  Location: [place];  Service: Cardiovascular;  Laterality: N/A;  . COLONOSCOPY WITH PROPOFOL N/A 07/19/2015   Procedure: COLONOSCOPY WITH PROPOFOL;  Surgeon: Manya Silvas, MD;  Location[facility] END[facility]COPY;  Service: Endoscopy;  Laterality: N/A;  . COLONOSCOPY WITH PROPOFOL N/A 01/27/2019   Procedure: COLONOSCOPY WITH PROPOFOL;  Surgeon: Manya Silvas, MD;  Location[facility];  Service: Endoscopy;  Laterality: N/A;  . cryo surgery of prostate    . CYSTOSCOPY WITH BIOPSY N/A 11/19/2020   Procedure: CYSTOSCOPY WITH POSSIBLE BLADDER BIOPSY;  Surgeon: Abbie Sons, MD;  Location: [facility];  Service: Urology;  Laterality: N/A;  . ESOPHAGOGASTRODUODENOSCOPY N/A 01/27/2019   Procedure: ESOPHAGOGASTRODUODENOSCOPY (EGD);  Surgeon: Manya Silvas, MD;  Location[facility] ENDOSCOPY;  Service: Endoscopy;  Laterality: N/A;  . ESOPHAGOGASTRODUODENOSCOPY (EGD) WITH PROPOFOL N/A 07/19/2015   Procedure: ESOPHAGOGASTRODUODENOSCOPY (EGD) WITH PROPOFOL;  Surgeon: Manya Silvas, MD;  Location: [facility] ENDOSCOPY;  Service: Endoscopy;  Laterality: N/A;  . JOINT  REPLACEMENT     both knees  . LAPAROTOMY N/A 03/08/2015   Procedure: EXPLORATORY LAPAROTOMY;  Surgeon: Autumn Messing III, MD;  Location: [place];  Service: General;  Laterality: N/A;  . LAPAROTOMY N/A 03/09/2015   Procedure: EXPLORATORY LAPAROTOMY AND EVACUATION OF HEMATOMA;  Surgeon: Autumn Messing III, MD;  Location: [facility];  Service: General;  Laterality: N/A;  . LYSIS OF ADHESION N/A 03/08/2015   Procedure: LYSIS OF ADHESION;  Surgeon: Autumn Messing III, MD;  Location: [place];  Service: General;  Laterality: N/A;  . OMENTECTOMY N/A 03/08/2015   Procedure: OMENTECTOMY;  Surgeon: Autumn Messing III, MD;  Location: [place];  Service: General;  Laterality: N/A;  . TEE WITHOUT CARDIOVERSION N/A 03/13/2015   Procedure: TRANSESOPHAGEAL ECHOCARDIOGRAM (TEE);  Surgeon: Jerline Pain, MD;  Location: [place];  Service: Cardiovascular;  Laterality: N/A;  . TOTAL KNEE ARTHROPLASTY Left 04/08/2016   Procedure: LEFT TOTAL KNEE ARTHROPLASTY;  Surgeon: Leandrew Koyanagi, MD;  Location: [place];  Service: Orthopedics;  Laterality: Left;  . TOTAL KNEE ARTHROPLASTY Right 12/04/2019   Procedure: RIGHT TOTAL KNEE ARTHROPLASTY;  Surgeon: Leandrew Koyanagi, MD;  Location: [place];  Service: Orthopedics;  Laterality: Right;  . UMBILICAL HERNIA REPAIR  03/08/2015   Procedure: UMBILICAL HERNIA REPAIR  ADULT;  Surgeon: Autumn Messing III, MD;  Location: [place];  Service: General;;  . VENTRAL HERNIA REPAIR N/A 03/08/2015   Procedure:  VENTRAL HERNIA  REPAIR ADULT;  Surgeon: Autumn Messing III, MD;  Location: [place];  Service: General;  Laterality: N/A;    FAMILY HISTORY: History reviewed. No pertinent family history.  SOCIAL HISTORY:  Social History   Socioeconomic History  . Marital status: Married    Spouse name: Financial controller  . Number of children: Not on file  . Years of education: Not on file  . Highest education level: Not on file  Occupational History  . Not on file  Tobacco Use  . Smoking status: Former Smoker    Years: 20.00    Types: Cigarettes    Quit  date: 08/03/1988    Years since quitting: 32.3  . Smokeless tobacco: Never Used  Vaping Use  . Vaping Use: Never used  Substance and Sexual Activity  . Alcohol use: No  . Drug use: No  . Sexual activity: Yes  Other Topics Concern  . Not on file  Social History Narrative   Lives with wife   Left  Handed   Drinks no caffeine   Social Determinants of Radio broadcast assistant Strain: Not on file  Food Insecurity: Not on file  Transportation Needs: Not on file  Physical Activity: Not on file  Stress: Not on file  Social Connections: Not on file  Intimate Partner Violence: Not on file     PHYSICAL EXAM  Vitals:   12/11/20 1251  BP: 121/65  Pulse: (!) 49  Weight: 217 lb (98.4 kg)  Height: 5\' 5"  (1.651 m)    Body mass index is 36.11 kg/m.   General: The patient is well-developed and well-nourished and in no acute distress  HEENT:  Head is /AT.  Sclera are anicteric.   Neck: No carotid bruits are noted.  The neck is nontender.  Cardiovascular: The heart has a regular rate and rhythm with a normal S1 and S2. There were no murmurs, gallops or rubs.    Skin: Extremities are without rash or  edema.  Musculoskeletal:  Back is nontender  Neurologic Exam  Mental status: The patient is alert and oriented x 2. The patient has poor memory and reduced attention.   Speech is normal.      Cranial nerves: Extraocular movements are full.  Facial symmetry is present. There is good facial sensation to soft touch bilaterally.Facial strength is normal.  Trapezius and sternocleidomastoid strength is normal. No dysarthria is noted.  The tongue is midline, and the patient has symmetric elevation of the soft palate. No obvious hearing deficits are noted.  Motor:  Muscle bulk is normal.   Tone is normal. Strength is  5 / 5 in all 4 extremities.   Sensory: Sensory testing is intact to pinprick, soft touch and vibration sensation in arms and proximal legs.  Vibration sensation was mildly  reduced in the toes.  Coordination: Cerebellar testing reveals good finger-nose-finger and heel-to-shin bilaterally.  Gait and station: Station is normal.   Gait is arthritic. Tandem gait is wide.  He has a couple steps of retropulsion.  Romberg is negative.   Reflexes: Deep tendon reflexes are symmetric and normal bilaterally.   Plantar responses are flexor.    DIAGNOSTIC DATA (LABS, IMAGING, TESTING) - I reviewed patient records, labs, notes, testing and imaging myself where available.  Lab Results  Component Value Date   WBC 8.9 12/05/2019   HGB 10.6 (L) 12/05/2019   HCT 32.7 (L) 12/05/2019   MCV 92.9 12/05/2019   PLT 131 (L) 12/05/2019      Component Value Date/Time   NA 139 11/15/2020 1047   K 3.2 (L) 11/15/2020 1047   CL 101 11/15/2020 1047   CO2 32 11/15/2020 1047   GLUCOSE 116 (H) 11/15/2020 1047   BUN 18 11/15/2020 1047   CREATININE 1.25 (H) 11/15/2020 1047   CALCIUM 8.6 (L) 11/15/2020 1047   PROT 6.4 (L) 11/30/2019 1030   ALBUMIN 3.6 11/30/2019 1030   AST 20 11/30/2019 1030   ALT 18 11/30/2019 1030   ALKPHOS 43 11/30/2019 1030   BILITOT 0.7 11/30/2019 1030   GFRNONAA 59 (L) 11/15/2020 1047   GFRAA >60 12/05/2019 0635    Lab Results  Component Value Date   TSH 5.410 (H) 08/13/2020       ASSESSMENT AND PLAN  Dementia with behavioral disturbance, unspecified dementia type ([place])  Memory loss  Depressed skull fracture, sequela (HCC)  Alzheimer's disease ([place])  1.  Unfortunately, his dementia has not seemed to improve on memantine and donepezil.  I did discuss with the wife  that it is possible those medications might slow the progression down but that, in general, good efficacy is uncommon.  His cognitive problems are likely a combination of the right temporal encephalomalacia (from either a remote stroke or remote skull injury) combined with the atrophy that is in a pattern consistent with Alzheimer's disease. 2.   Stay active and exercise as  tolerated. 3.   Return as needed if significantly worsening neurologic symptoms.  Brynlee Pennywell A. Felecia Shelling, MD, [facility] [id number], [id number] PM Certified in Neurology, Clinical Neurophysiology, Sleep Medicine and Neuroimaging  [facility] [address] [place], [place] [address] [phone]

## 2021-01-13 DIAGNOSIS — H2513 Age-related nuclear cataract, bilateral: Secondary | ICD-10-CM | POA: Diagnosis not present

## 2021-01-14 DIAGNOSIS — Z0000 Encounter for general adult medical examination without abnormal findings: Secondary | ICD-10-CM | POA: Diagnosis not present

## 2021-01-14 DIAGNOSIS — E785 Hyperlipidemia, unspecified: Secondary | ICD-10-CM | POA: Diagnosis not present

## 2021-01-14 DIAGNOSIS — Z125 Encounter for screening for malignant neoplasm of prostate: Secondary | ICD-10-CM | POA: Diagnosis not present

## 2021-01-14 DIAGNOSIS — R946 Abnormal results of thyroid function studies: Secondary | ICD-10-CM | POA: Diagnosis not present

## 2021-01-14 DIAGNOSIS — R739 Hyperglycemia, unspecified: Secondary | ICD-10-CM | POA: Diagnosis not present

## 2021-01-21 DIAGNOSIS — E785 Hyperlipidemia, unspecified: Secondary | ICD-10-CM | POA: Diagnosis not present

## 2021-01-21 DIAGNOSIS — F0390 Unspecified dementia without behavioral disturbance: Secondary | ICD-10-CM | POA: Diagnosis not present

## 2021-01-21 DIAGNOSIS — R454 Irritability and anger: Secondary | ICD-10-CM | POA: Diagnosis not present

## 2021-01-21 DIAGNOSIS — C61 Malignant neoplasm of prostate: Secondary | ICD-10-CM | POA: Diagnosis not present

## 2021-01-21 DIAGNOSIS — I480 Paroxysmal atrial fibrillation: Secondary | ICD-10-CM | POA: Diagnosis not present

## 2021-01-21 DIAGNOSIS — G729 Myopathy, unspecified: Secondary | ICD-10-CM | POA: Diagnosis not present

## 2021-01-21 DIAGNOSIS — M8580 Other specified disorders of bone density and structure, unspecified site: Secondary | ICD-10-CM | POA: Diagnosis not present

## 2021-01-21 DIAGNOSIS — Z0000 Encounter for general adult medical examination without abnormal findings: Secondary | ICD-10-CM | POA: Diagnosis not present

## 2021-01-21 DIAGNOSIS — I110 Hypertensive heart disease with heart failure: Secondary | ICD-10-CM | POA: Diagnosis not present

## 2021-01-21 DIAGNOSIS — I77810 Thoracic aortic ectasia: Secondary | ICD-10-CM | POA: Diagnosis not present

## 2021-01-21 DIAGNOSIS — I2510 Atherosclerotic heart disease of native coronary artery without angina pectoris: Secondary | ICD-10-CM | POA: Diagnosis not present

## 2021-01-21 DIAGNOSIS — I5022 Chronic systolic (congestive) heart failure: Secondary | ICD-10-CM | POA: Diagnosis not present

## 2021-01-31 ENCOUNTER — Encounter: Payer: Self-pay | Admitting: Urology

## 2021-01-31 ENCOUNTER — Other Ambulatory Visit: Payer: Self-pay

## 2021-01-31 ENCOUNTER — Ambulatory Visit: Payer: PPO | Admitting: Urology

## 2021-01-31 VITALS — BP 121/69 | HR 52 | Ht 65.0 in | Wt 212.5 lb

## 2021-01-31 DIAGNOSIS — C61 Malignant neoplasm of prostate: Secondary | ICD-10-CM

## 2021-01-31 DIAGNOSIS — N3945 Continuous leakage: Secondary | ICD-10-CM

## 2021-01-31 DIAGNOSIS — N3941 Urge incontinence: Secondary | ICD-10-CM | POA: Diagnosis not present

## 2021-01-31 LAB — URINALYSIS, COMPLETE
Bilirubin, UA: NEGATIVE
Glucose, UA: NEGATIVE
Ketones, UA: NEGATIVE
Leukocytes,UA: NEGATIVE
Nitrite, UA: NEGATIVE
Protein,UA: NEGATIVE
RBC, UA: NEGATIVE
Specific Gravity, UA: 1.02 (ref 1.005–1.030)
Urobilinogen, Ur: 0.2 mg/dL (ref 0.2–1.0)
pH, UA: 6 (ref 5.0–7.5)

## 2021-01-31 LAB — BLADDER SCAN AMB NON-IMAGING

## 2021-01-31 NOTE — Patient Instructions (Signed)
Call office back to let us know how the medication is working for you.

## 2021-01-31 NOTE — Progress Notes (Signed)
01/31/2021 1:49 PM   Jim Dodson [birth year] [id number]  Referring provider: Shon Baton, MD [address] [place],  [place] [address]  Chief Complaint  Patient presents with   Urinary Incontinence    HPI: 80 y.o. male presents for follow-up of urinary incontinence.  Previously for microhematuria and urge incontinence Prior history cryoablation and radiation for prostate cancer Salvage radiation completed December 2019 with worsening voiding symptoms posttreatment Refer to my prior note 09/20/2020 Underwent bladder biopsy 11/2020 for area of erythema which was inflammatory and no evidence of cancer/CIS Presently on tamsulosin and Myrbetriq with urge incontinence which is worse when changing positions from supine/sitting to standing No real improvement with Myrbetriq and was also given a trial of Gemtesa Denies gross hematuria   PMH: Past Medical History:  Diagnosis Date   Actinic keratosis    Aortic atherosclerosis (HCC)    Arthritis    Coronary artery disease    COVID-19    DDD (degenerative disc disease), lumbar    with associated multilevel impingement   Diastolic heart failure (HCC)    Diverticulosis    GERD (gastroesophageal reflux disease)    Headache    History of hiatal hernia    History of kidney stones    HLD (hyperlipidemia)    Hypertension    IDA (iron deficiency anemia)    Lumbar spondylolysis    Myelolipoma of right adrenal gland    Paroxysmal atrial fibrillation (HCC)    Prostate cancer (HCC)    Seborrheic dermatitis    Shortness of breath dyspnea    Subendocardial myocardial infarction [facility]) 07/2009   PCI with DES x 1 placed to LAD    Surgical History: Past Surgical History:  Procedure Laterality Date   CARDIAC CATHETERIZATION     2009, stent placed   CARDIOVERSION N/A 03/13/2015   Procedure: CARDIOVERSION;  Surgeon: Jerline Pain, MD;  Location: [place];  Service: Cardiovascular;  Laterality: N/A;   COLONOSCOPY WITH PROPOFOL N/A  07/19/2015   Procedure: COLONOSCOPY WITH PROPOFOL;  Surgeon: Manya Silvas, MD;  Location[facility] ENDOSCOPY;  Service: Endoscopy;  Laterality: N/A;   COLONOSCOPY WITH PROPOFOL N/A 01/27/2019   Procedure: COLONOSCOPY WITH PROPOFOL;  Surgeon: Manya Silvas, MD;  Location: [facility] ENDOSCOPY;  Service: Endoscopy;  Laterality: N/A;   cryo surgery of prostate     CYSTOSCOPY WITH BIOPSY N/A 11/19/2020   Procedure: CYSTOSCOPY WITH POSSIBLE BLADDER BIOPSY;  Surgeon: Abbie Sons, MD;  Location: [facility];  Service: Urology;  Laterality: N/A;   ESOPHAGOGASTRODUODENOSCOPY N/A 01/27/2019   Procedure: ESOPHAGOGASTRODUODENOSCOPY (EGD);  Surgeon: Manya Silvas, MD;  Location: [facility] ENDOSCOPY;  Service: Endoscopy;  Laterality: N/A;   ESOPHAGOGASTRODUODENOSCOPY (EGD) WITH PROPOFOL N/A 07/19/2015   Procedure: ESOPHAGOGASTRODUODENOSCOPY (EGD) WITH PROPOFOL;  Surgeon: Manya Silvas, MD;  Location: [facility] ENDOSCOPY;  Service: Endoscopy;  Laterality: N/A;   JOINT REPLACEMENT     both knees   LAPAROTOMY N/A 03/08/2015   Procedure: EXPLORATORY LAPAROTOMY;  Surgeon: Autumn Messing III, MD;  Location: [place];  Service: General;  Laterality: N/A;   LAPAROTOMY N/A 03/09/2015   Procedure: EXPLORATORY LAPAROTOMY AND EVACUATION OF HEMATOMA;  Surgeon: Autumn Messing III, MD;  Location: [place];  Service: General;  Laterality: N/A;   LYSIS OF ADHESION N/A 03/08/2015   Procedure: LYSIS OF ADHESION;  Surgeon: Autumn Messing III, MD;  Location: [place];  Service: General;  Laterality: N/A;   OMENTECTOMY N/A 03/08/2015   Procedure: OMENTECTOMY;  Surgeon: Autumn Messing III, MD;  Location[facility];  Service: General;  Laterality: N/A;   TEE WITHOUT CARDIOVERSION N/A 03/13/2015   Procedure: TRANSESOPHAGEAL ECHOCARDIOGRAM (TEE);  Surgeon: Jerline Pain, MD;  Location: [place];  Service: Cardiovascular;  Laterality: N/A;   TOTAL KNEE ARTHROPLASTY Left 04/08/2016   Procedure: LEFT TOTAL KNEE ARTHROPLASTY;  Surgeon: Leandrew Koyanagi, MD;  Location: [place];  Service:  Orthopedics;  Laterality: Left;   TOTAL KNEE ARTHROPLASTY Right 12/04/2019   Procedure: RIGHT TOTAL KNEE ARTHROPLASTY;  Surgeon: Leandrew Koyanagi, MD;  Location: [place];  Service: Orthopedics;  Laterality: Right;   UMBILICAL HERNIA REPAIR  03/08/2015   Procedure: UMBILICAL HERNIA REPAIR  ADULT;  Surgeon: Autumn Messing III, MD;  Location: [place];  Service: General;;   VENTRAL HERNIA REPAIR N/A 03/08/2015   Procedure:  VENTRAL HERNIA  REPAIR ADULT;  Surgeon: Autumn Messing III, MD;  Location: [place];  Service: General;  Laterality: N/A;    Home Medications:  Allergies as of 01/31/2021       Reactions   Statins Swelling, Other (See Comments)   Legs swelled up and cramped up really bad        Medication List        Accurate as of January 31, 2021 11:59 PM. If you have any questions, ask your nurse or doctor.          Acetaminophen 500 MG capsule Take by mouth.   ALLERGY RELIEF PO Take 1 Dose by mouth daily. Gets [employer] brand   ANTI-DIARRHEAL PO Take 1 Dose by mouth as needed. Gets [employer] brand   apixaban 5 MG Tabs tablet Commonly known as: ELIQUIS Take 5 mg by mouth 2 (two) times daily.   donepezil 10 MG tablet Commonly known as: ARICEPT Take 1 tablet (10 mg total) by mouth at bedtime.   ferrous sulfate 325 (65 FE) MG tablet Take 325 mg by mouth in the morning and at bedtime.   hydrALAZINE 25 MG tablet Commonly known as: APRESOLINE Take 1 tablet by mouth 3 (three) times daily.   hydrochlorothiazide 25 MG tablet Commonly known as: HYDRODIURIL Take 25 mg by mouth daily.   hydrocortisone 2.5 % lotion Apply topically as directed. Apply to scaly areas on face and chest 3 nights per week Monday, Wednesday and fridays prn flares What changed: Another medication with the same name was removed. Continue taking this medication, and follow the directions you see here. Changed by: Abbie Sons, MD   ketoconazole 2 % cream Commonly known as: NIZORAL Apply 1 application  topically 3 (three) times a week. Tues, Thurs, Sat at night   meclizine 12.5 MG tablet Commonly known as: ANTIVERT Take 12.5 mg by mouth daily.   memantine 10 MG tablet Commonly known as: NAMENDA Take 10 mg by mouth 2 (two) times daily.   metoprolol tartrate 100 MG tablet Commonly known as: LOPRESSOR Take 1 tablet (100 mg total) by mouth 2 (two) times daily.   Myrbetriq 50 MG Tb24 tablet Generic drug: mirabegron ER TAKE 1 TABLET BY MOUTH DAILY What changed: how much to take   omeprazole 20 MG capsule Commonly known as: PRILOSEC Take 20 mg by mouth daily.   Red Yeast Rice Extract 600 MG Caps Take 600 mg by mouth 2 (two) times daily.   REFRESH OPTIVE PF OP Place 1 drop into both eyes 2 (two) times daily as needed (dry eyes).   tamsulosin 0.4 MG Caps capsule Commonly known as: FLOMAX TAKE 1 CAPSULE BY MOUTH TWICE DAILY What  changed: when to take this   Vitamin D3 125 MCG (5000 UT) Tabs Take 5,000 Units by mouth daily.        Allergies:  Allergies  Allergen Reactions   Statins Swelling and Other (See Comments)    Legs swelled up and cramped up really bad    Family History: No family history on file.  Social History:  reports that he quit smoking about 32 years ago. His smoking use included cigarettes. He has never used smokeless tobacco. He reports that he does not drink alcohol and does not use drugs.   Physical Exam: BP 121/69 (BP Location: Left Arm, Patient Position: Sitting, Cuff Size: Large)   Pulse (!) 52   Ht 5\' 5"  (1.651 m)   Wt 212 lb 8 oz (96.4 kg)   BMI 35.36 kg/m   Constitutional:  Alert and oriented, No acute distress. HEENT: [place] AT, moist mucus membranes.  Trachea midline, no masses. Cardiovascular: No clubbing, cyanosis, or edema. Respiratory: Normal respiratory effort, no increased work of breathing. Neurologic: Grossly intact, no focal deficits, moving all 4 extremities. Psychiatric: Normal mood and affect.   Assessment & Plan:    1.   Urge incontinence Persistent sxs Bladder scan PVR 3 mL Has failed beta 3 agonist therapy Will add Toviaz 4 mg daily Combination therapy initially however they are in donut hole for Myrbetriq and did not recommend refilling Will call back regarding efficacy of Toviaz/Myrbetriq Appointment for second opinion with Dr. Matilde Sprang if no significant improvement noted   Abbie Sons, [facility] [address], [place], [place] [address] [phone]

## 2021-02-03 ENCOUNTER — Encounter: Payer: Self-pay | Admitting: Urology

## 2021-02-21 ENCOUNTER — Telehealth: Payer: Self-pay | Admitting: Orthopaedic Surgery

## 2021-02-21 ENCOUNTER — Other Ambulatory Visit: Payer: Self-pay | Admitting: Orthopaedic Surgery

## 2021-02-21 ENCOUNTER — Other Ambulatory Visit: Payer: Self-pay

## 2021-02-21 MED ORDER — AMOXICILLIN 500 MG PO TABS: ORAL_TABLET | ORAL | 0 refills | Status: DC

## 2021-02-21 NOTE — Telephone Encounter (Signed)
Patient's wife called. Wants to make sure Amoxicillin is called in for the patient before Monday. He has a dental procedure Monday. Call back number is  [phone]

## 2021-02-21 NOTE — Telephone Encounter (Signed)
Can you call in 500 mg caps take 4 one hour prior to dental work.  thanks

## 2021-02-21 NOTE — Telephone Encounter (Signed)
Patient aware.

## 2021-03-31 ENCOUNTER — Other Ambulatory Visit: Payer: Self-pay | Admitting: Radiation Oncology

## 2021-04-11 ENCOUNTER — Telehealth: Payer: Self-pay | Admitting: Orthopaedic Surgery

## 2021-04-11 MED ORDER — AMOXICILLIN 500 MG PO CAPS: 2000.0000 mg | ORAL_CAPSULE | Freq: Once | ORAL | 6 refills | Status: AC

## 2021-04-11 NOTE — Addendum Note (Signed)
Addended by: Azucena Cecil on: 04/11/2021 12:18 PM   Modules accepted: Orders

## 2021-04-11 NOTE — Telephone Encounter (Signed)
Refill on amoxicillin

## 2021-04-11 NOTE — Telephone Encounter (Signed)
Done

## 2021-04-16 DIAGNOSIS — I110 Hypertensive heart disease with heart failure: Secondary | ICD-10-CM | POA: Diagnosis not present

## 2021-04-16 DIAGNOSIS — I77819 Aortic ectasia, unspecified site: Secondary | ICD-10-CM | POA: Diagnosis not present

## 2021-04-16 DIAGNOSIS — I10 Essential (primary) hypertension: Secondary | ICD-10-CM | POA: Diagnosis not present

## 2021-04-16 DIAGNOSIS — I951 Orthostatic hypotension: Secondary | ICD-10-CM | POA: Diagnosis not present

## 2021-04-16 DIAGNOSIS — I25118 Atherosclerotic heart disease of native coronary artery with other forms of angina pectoris: Secondary | ICD-10-CM | POA: Diagnosis not present

## 2021-04-16 DIAGNOSIS — I4891 Unspecified atrial fibrillation: Secondary | ICD-10-CM | POA: Diagnosis not present

## 2021-05-07 ENCOUNTER — Ambulatory Visit: Payer: PPO | Admitting: Urology

## 2021-05-07 ENCOUNTER — Other Ambulatory Visit: Payer: Self-pay

## 2021-05-07 VITALS — BP 115/74 | HR 64 | Ht 66.0 in | Wt 210.0 lb

## 2021-05-07 DIAGNOSIS — C61 Malignant neoplasm of prostate: Secondary | ICD-10-CM

## 2021-05-07 DIAGNOSIS — N3941 Urge incontinence: Secondary | ICD-10-CM

## 2021-05-07 LAB — BLADDER SCAN AMB NON-IMAGING: Scan Result: 0

## 2021-05-07 MED ORDER — TROSPIUM CHLORIDE 20 MG PO TABS: 20.0000 mg | ORAL_TABLET | Freq: Two times a day (BID) | ORAL | 0 refills | Status: DC

## 2021-05-07 NOTE — Progress Notes (Signed)
05/07/2021 3:51 PM   Jim Dodson 06-26-1941 [id number]  Referring provider: Shon Baton, MD [address] [place],  [place] [address]  Chief Complaint  Patient presents with   Other    Urologic history: Prostate Cancer History of intermediate risk prostate cancer status post cryoablation by Dr. Jacqlyn Larsen 2008 and salvage radiation Salvage radiation completed December 2019 with worsening voiding symptoms posttreatment  2. Urge incontinence Post radiation with storage elated voiding symptoms including frequency, urgency, urge incontinence and nocturia x3 Refractory to tamsulosin, beta-3 and anticholinergic meds  3. Bladder erythema Underwent bladder biopsy 11/2020 for area of erythema which was inflammatory and no evidence of cancer/CIS   HPI: 80 y.o. male presents for follow-up.  He presents today with his wife.  Last seen 01/31/2021 No improvement on fesoterodine Currently wearing diapers and has frequency, urgency with urge incontinence.  Triggers running water and changing position from supine/sitting to standing Has failed Myrbetriq, Gemtesa and fesoterodine Remains on tamsulosin Denies dysuria, gross hematuria   PMH: Past Medical History:  Diagnosis Date   Actinic keratosis    Aortic atherosclerosis (HCC)    Arthritis    Coronary artery disease    COVID-19    DDD (degenerative disc disease), lumbar    with associated multilevel impingement   Diastolic heart failure (HCC)    Diverticulosis    GERD (gastroesophageal reflux disease)    Headache    History of hiatal hernia    History of kidney stones    HLD (hyperlipidemia)    Hypertension    IDA (iron deficiency anemia)    Lumbar spondylolysis    Myelolipoma of right adrenal gland    Paroxysmal atrial fibrillation (HCC)    Prostate cancer (HCC)    Seborrheic dermatitis    Shortness of breath dyspnea    Subendocardial myocardial infarction [facility]) 07/2009   PCI with DES x 1 placed to LAD    Surgical  History: Past Surgical History:  Procedure Laterality Date   CARDIAC CATHETERIZATION     2009, stent placed   CARDIOVERSION N/A 03/13/2015   Procedure: CARDIOVERSION;  Surgeon: Jerline Pain, MD;  Location: [place];  Service: Cardiovascular;  Laterality: N/A;   COLONOSCOPY WITH PROPOFOL N/A 07/19/2015   Procedure: COLONOSCOPY WITH PROPOFOL;  Surgeon: Manya Silvas, MD;  Location: [facility] ENDOSCOPY;  Service: Endoscopy;  Laterality: N/A;   COLONOSCOPY WITH PROPOFOL N/A 01/27/2019   Procedure: COLONOSCOPY WITH PROPOFOL;  Surgeon: Manya Silvas, MD;  Location: [facility] ENDOSCOPY;  Service: Endoscopy;  Laterality: N/A;   cryo surgery of prostate     CYSTOSCOPY WITH BIOPSY N/A 11/19/2020   Procedure: CYSTOSCOPY WITH POSSIBLE BLADDER BIOPSY;  Surgeon: Abbie Sons, MD;  Location: [facility] ORS;  Service: Urology;  Laterality: N/A;   ESOPHAGOGASTRODUODENOSCOPY N/A 01/27/2019   Procedure: ESOPHAGOGASTRODUODENOSCOPY (EGD);  Surgeon: Manya Silvas, MD;  Location: [facility] ENDOSCOPY;  Service: Endoscopy;  Laterality: N/A;   ESOPHAGOGASTRODUODENOSCOPY (EGD) WITH PROPOFOL N/A 07/19/2015   Procedure: ESOPHAGOGASTRODUODENOSCOPY (EGD) WITH PROPOFOL;  Surgeon: Manya Silvas, MD;  Location: [facility] ENDOSCOPY;  Service: Endoscopy;  Laterality: N/A;   JOINT REPLACEMENT     both knees   LAPAROTOMY N/A 03/08/2015   Procedure: EXPLORATORY LAPAROTOMY;  Surgeon: Autumn Messing III, MD;  Location: [place];  Service: General;  Laterality: N/A;   LAPAROTOMY N/A 03/09/2015   Procedure: EXPLORATORY LAPAROTOMY AND EVACUATION OF HEMATOMA;  Surgeon: Autumn Messing III, MD;  Location: [place];  Service: General;  Laterality: N/A;   LYSIS OF ADHESION  N/A 03/08/2015   Procedure: LYSIS OF ADHESION;  Surgeon: Autumn Messing III, MD;  Location: [place];  Service: General;  Laterality: N/A;   OMENTECTOMY N/A 03/08/2015   Procedure: OMENTECTOMY;  Surgeon: Autumn Messing III, MD;  Location: [place];  Service: General;  Laterality: N/A;   TEE WITHOUT CARDIOVERSION  N/A 03/13/2015   Procedure: TRANSESOPHAGEAL ECHOCARDIOGRAM (TEE);  Surgeon: Jerline Pain, MD;  Location: [place];  Service: Cardiovascular;  Laterality: N/A;   TOTAL KNEE ARTHROPLASTY Left 04/08/2016   Procedure: LEFT TOTAL KNEE ARTHROPLASTY;  Surgeon: Leandrew Koyanagi, MD;  Location: [place];  Service: Orthopedics;  Laterality: Left;   TOTAL KNEE ARTHROPLASTY Right 12/04/2019   Procedure: RIGHT TOTAL KNEE ARTHROPLASTY;  Surgeon: Leandrew Koyanagi, MD;  Location: [place];  Service: Orthopedics;  Laterality: Right;   UMBILICAL HERNIA REPAIR  03/08/2015   Procedure: UMBILICAL HERNIA REPAIR  ADULT;  Surgeon: Autumn Messing III, MD;  Location: [place];  Service: General;;   VENTRAL HERNIA REPAIR N/A 03/08/2015   Procedure:  VENTRAL HERNIA  REPAIR ADULT;  Surgeon: Autumn Messing III, MD;  Location: [place];  Service: General;  Laterality: N/A;    Home Medications:  Allergies as of 05/07/2021       Reactions   Statins Swelling, Other (See Comments)   Legs swelled up and cramped up really bad        Medication List        Accurate as of May 07, 2021  3:51 PM. If you have any questions, ask your nurse or doctor.          Acetaminophen 500 MG capsule Take by mouth.   ALLERGY RELIEF PO Take 1 Dose by mouth daily. Gets [employer] brand   amoxicillin 500 MG capsule Commonly known as: AMOXIL TAKE 4 CAPSULES BY MOUTH 1 HOUR PRIOR TOPROCEDURE.   amoxicillin 500 MG tablet Commonly known as: AMOXIL take 4 tabs po one hour prior to dental work.   ANTI-DIARRHEAL PO Take 1 Dose by mouth as needed. Gets [employer] brand   apixaban 5 MG Tabs tablet Commonly known as: ELIQUIS Take 5 mg by mouth 2 (two) times daily.   donepezil 10 MG tablet Commonly known as: ARICEPT Take 1 tablet (10 mg total) by mouth at bedtime.   ferrous sulfate 325 (65 FE) MG tablet Take 325 mg by mouth in the morning and at bedtime.   hydrALAZINE 25 MG tablet Commonly known as: APRESOLINE Take 1 tablet by mouth 3 (three)  times daily.   hydrochlorothiazide 25 MG tablet Commonly known as: HYDRODIURIL Take 25 mg by mouth daily.   hydrocortisone 2.5 % lotion Apply topically as directed. Apply to scaly areas on face and chest 3 nights per week Monday, Wednesday and fridays prn flares   ketoconazole 2 % cream Commonly known as: NIZORAL Apply 1 application topically 3 (three) times a week. Tues, Thurs, Sat at night   meclizine 12.5 MG tablet Commonly known as: ANTIVERT Take 12.5 mg by mouth daily.   memantine 10 MG tablet Commonly known as: NAMENDA Take 10 mg by mouth 2 (two) times daily.   metoprolol tartrate 100 MG tablet Commonly known as: LOPRESSOR Take 1 tablet (100 mg total) by mouth 2 (two) times daily.   Myrbetriq 50 MG Tb24 tablet Generic drug: mirabegron ER TAKE 1 TABLET BY MOUTH DAILY What changed: how much to take   omeprazole 20 MG capsule Commonly known as: PRILOSEC Take 20 mg by mouth daily.  Red Yeast Rice Extract 600 MG Caps Take 600 mg by mouth 2 (two) times daily.   REFRESH OPTIVE PF OP Place 1 drop into both eyes 2 (two) times daily as needed (dry eyes).   tamsulosin 0.4 MG Caps capsule Commonly known as: FLOMAX TAKE 1 CAPSULE BY MOUTH TWICE DAILY   Vitamin D3 125 MCG (5000 UT) Tabs Take 5,000 Units by mouth daily.        Allergies:  Allergies  Allergen Reactions   Statins Swelling and Other (See Comments)    Legs swelled up and cramped up really bad    Family History: No family history on file.  Social History:  reports that he quit smoking about 32 years ago. His smoking use included cigarettes. He has never used smokeless tobacco. He reports that he does not drink alcohol and does not use drugs.   Physical Exam: BP 115/74   Pulse 64   Ht 5\' 6"  (1.676 m)   Wt 210 lb (95.3 kg)   BMI 33.89 kg/m   Constitutional:  Alert , No acute distress. HEENT: [place] AT, moist mucus membranes.  Trachea midline, no masses. Cardiovascular: No clubbing, cyanosis, or  edema. Respiratory: Normal respiratory effort, no increased work of breathing. Psychiatric: Normal mood and affect.   Assessment & Plan:    1. Urge incontinence No improvement Bladder scan PVR 0 mL Refractory to tamsulosin, Myrbetriq, Gemtesa and fesoterodine Inform patient and his wife that unlikely any additional medication will be beneficial.  They would like another trial and Rx trospium was sent to pharmacy Gave literature on PTNS Do not feel he is a good candidate for Botox He will call back regarding efficacy of trospium  2. Prostate Cancer Due for PSA 06/2021   Abbie Sons, MD  [place] [address] [place], [place] [address] [phone]

## 2021-05-08 ENCOUNTER — Encounter: Payer: Self-pay | Admitting: Urology

## 2021-05-09 ENCOUNTER — Telehealth: Payer: Self-pay | Admitting: Family Medicine

## 2021-05-09 DIAGNOSIS — C61 Malignant neoplasm of prostate: Secondary | ICD-10-CM

## 2021-05-09 NOTE — Telephone Encounter (Signed)
-----   Message from Abbie Sons, MD sent at 05/08/2021  9:42 PM EDT ----- Needs LV for PSA 06/2021

## 2021-05-09 NOTE — Telephone Encounter (Signed)
Patient notified and appointment has been scheduled 

## 2021-05-21 ENCOUNTER — Ambulatory Visit: Payer: PPO | Admitting: Dermatology

## 2021-06-11 ENCOUNTER — Inpatient Hospital Stay: Payer: PPO | Attending: Radiation Oncology

## 2021-06-11 ENCOUNTER — Other Ambulatory Visit: Payer: Self-pay

## 2021-06-11 DIAGNOSIS — C61 Malignant neoplasm of prostate: Secondary | ICD-10-CM | POA: Diagnosis not present

## 2021-06-11 LAB — PSA: Prostatic Specific Antigen: 0.01 ng/mL (ref 0.00–4.00)

## 2021-06-12 ENCOUNTER — Other Ambulatory Visit: Payer: PPO

## 2021-06-18 ENCOUNTER — Ambulatory Visit: Payer: PPO | Admitting: Radiation Oncology

## 2021-06-23 DIAGNOSIS — I509 Heart failure, unspecified: Secondary | ICD-10-CM | POA: Diagnosis not present

## 2021-06-23 DIAGNOSIS — Z8679 Personal history of other diseases of the circulatory system: Secondary | ICD-10-CM | POA: Diagnosis not present

## 2021-06-23 DIAGNOSIS — K6389 Other specified diseases of intestine: Secondary | ICD-10-CM | POA: Diagnosis not present

## 2021-06-23 DIAGNOSIS — F0390 Unspecified dementia without behavioral disturbance: Secondary | ICD-10-CM | POA: Diagnosis not present

## 2021-06-23 DIAGNOSIS — I2510 Atherosclerotic heart disease of native coronary artery without angina pectoris: Secondary | ICD-10-CM | POA: Diagnosis not present

## 2021-06-23 DIAGNOSIS — Z87891 Personal history of nicotine dependence: Secondary | ICD-10-CM | POA: Diagnosis not present

## 2021-07-02 ENCOUNTER — Ambulatory Visit: Payer: PPO | Admitting: Radiation Oncology

## 2021-07-04 ENCOUNTER — Other Ambulatory Visit: Payer: Self-pay

## 2021-07-04 ENCOUNTER — Encounter: Payer: Self-pay | Admitting: Radiation Oncology

## 2021-07-04 ENCOUNTER — Ambulatory Visit
Admission: RE | Admit: 2021-07-04 | Discharge: 2021-07-04 | Disposition: A | Discharge status: Home / Self Care | Payer: PPO | Source: Ambulatory Visit | Attending: Radiation Oncology | Admitting: Radiation Oncology

## 2021-07-04 NOTE — Progress Notes (Signed)
Radiation Oncology Follow up Note  Name: Jim Dodson   Date:   07/04/2021 MRN:  [id number] DOB: [birth year]    This 80 y.o. male presents to the clinic today for 3-year follow-up status post salvage radiation therapy to his prostate status post cryotherapy patient with known stage IIIb adenocarcinoma the prostate.  REFERRING PROVIDER: Shon Baton, MD  HPI: Patient is 80 year old male now out 3 years having pleated salvage radiation therapy.  He had cryotherapy back prior to treatment for stage IIIb adenocarcinoma.  Seen today in routine follow-up he is doing well still has some nocturia x3.  No specific urgency or frequency.  Bowel function is good.  His PSA remains undetectable at less than 0.1.[place] Kitchen  He is followed by urology for bilateral nonobstructive nephrolithiasis.  COMPLICATIONS OF TREATMENT: none and present  FOLLOW UP COMPLIANCE: keeps appointments   PHYSICAL EXAM:  BP 109/71   Pulse 77   Temp 97.6 F (36.4 C) (Tympanic)   Wt 203 lb 6.4 oz (92.3 kg)   BMI 32.83 kg/m  Well-developed well-nourished patient in NAD. HEENT reveals PERLA, EOMI, discs not visualized.  Oral cavity is clear. No oral mucosal lesions are identified. Neck is clear without evidence of cervical or supraclavicular adenopathy. Lungs are clear to A&P. Cardiac examination is essentially unremarkable with regular rate and rhythm without murmur rub or thrill. Abdomen is benign with no organomegaly or masses noted. Motor sensory and DTR levels are equal and symmetric in the upper and lower extremities. Cranial nerves II through XII are grossly intact. Proprioception is intact. No peripheral adenopathy or edema is identified. No motor or sensory levels are noted. Crude visual fields are within normal range.  RADIOLOGY RESULTS: CT scans reviewed compatible with above-stated findings  PLAN: Present time patient is under excellent biochemical control 3 years out from salvage radiation therapy.  At this time I am turning  follow-up care over to Dr. Bernardo Heater as well as his GP.  I would be happy to reevaluate the patient anytime should further consultation be indicated.  I have assured him he seems to be in complete remission at this time.  I would like to take this opportunity to thank you for allowing me to participate in the care of your patient.Noreene Filbert, MD

## 2021-07-08 ENCOUNTER — Other Ambulatory Visit: Payer: Self-pay | Admitting: Urology

## 2021-07-29 ENCOUNTER — Other Ambulatory Visit: Payer: Self-pay | Admitting: Urology

## 2021-08-18 ENCOUNTER — Other Ambulatory Visit: Payer: Self-pay

## 2021-08-18 ENCOUNTER — Ambulatory Visit: Payer: Medicare Other | Admitting: Dermatology

## 2021-08-18 DIAGNOSIS — L570 Actinic keratosis: Secondary | ICD-10-CM | POA: Diagnosis not present

## 2021-08-18 DIAGNOSIS — L578 Other skin changes due to chronic exposure to nonionizing radiation: Secondary | ICD-10-CM | POA: Diagnosis not present

## 2021-08-18 DIAGNOSIS — L219 Seborrheic dermatitis, unspecified: Secondary | ICD-10-CM

## 2021-08-18 DIAGNOSIS — L820 Inflamed seborrheic keratosis: Secondary | ICD-10-CM

## 2021-08-18 NOTE — Patient Instructions (Addendum)

## 2021-08-18 NOTE — Progress Notes (Signed)
Follow-Up Visit   Subjective  Jim Dodson is a 81 y.o. male who presents for the following: Actinic Keratosis (8 months f/u on Aks on the face and scalp ). The patient has spots, moles and lesions to be evaluated, some may be new or changing and the patient has concerns that these could be cancer.  The following portions of the chart were reviewed this encounter and updated as appropriate:   Tobacco   Allergies   Meds   Problems   Med Hx   Surg Hx   Fam Hx      Review of Systems:  No other skin or systemic complaints except as noted in HPI or Assessment and Plan.  Objective  Well appearing patient in no apparent distress; mood and affect are within normal limits.  A focused examination was performed including face. Relevant physical exam findings are noted in the Assessment and Plan.  nose Mainly clear skin   right temple x 1, left cheek x 1 (2) (2) Erythematous thin papules/macules with gritty scale.   scalp x 1 Stuck-on, waxy, tan-brown papule    Assessment & Plan  Seborrheic dermatitis nose Seborrheic Dermatitis  -  is a chronic persistent rash characterized by pinkness and scaling most commonly of the mid face but also can occur on the scalp (dandruff), ears; mid chest, mid back and groin.  It tends to be exacerbated by stress and cooler weather.  People who have neurologic disease may experience new onset or exacerbation of existing seborrheic dermatitis.  The condition is not curable but treatable and can be controlled.   Improved   Cont HC 2.5% lotion 3d/wk Monday, Wednesday, Friday  Cont Ketoconazole 2% cr 3d/wk Tuesday, Thursday, Saturday    Related Medications hydrocortisone 2.5 % lotion Apply topically as directed. Apply to scaly areas on face and chest 3 nights per week Monday, Wednesday and fridays prn flares  AK (actinic keratosis) right temple x 1, left cheek x 1 (2) Actinic keratoses are precancerous spots that appear secondary to cumulative UV  radiation exposure/sun exposure over time. They are chronic with expected duration over 1 year. A portion of actinic keratoses will progress to squamous cell carcinoma of the skin. It is not possible to reliably predict which spots will progress to skin cancer and so treatment is recommended to prevent development of skin cancer.  Recommend daily broad spectrum sunscreen SPF 30+ to sun-exposed areas, reapply every 2 hours as needed.  Recommend staying in the shade or wearing long sleeves, sun glasses (UVA+UVB protection) and wide brim hats (4-inch brim around the entire circumference of the hat). Call for new or changing lesions.   Destruction of lesion - right temple x 1, left cheek x 1 (2) Complexity: simple   Destruction method: cryotherapy   Informed consent: discussed and consent obtained   Timeout:  patient name, date of birth, surgical site, and procedure verified Lesion destroyed using liquid nitrogen: Yes   Region frozen until ice ball extended beyond lesion: Yes   Outcome: patient tolerated procedure well with no complications   Post-procedure details: wound care instructions given    Inflamed seborrheic keratosis scalp x 1 Reassured benign age-related growth.  Recommend observation.  Discussed cryotherapy if spot(s) become irritated or inflamed.  Destruction of lesion - scalp x 1 Complexity: simple   Destruction method: cryotherapy   Informed consent: discussed and consent obtained   Timeout:  patient name, date of birth, surgical site, and procedure verified Lesion destroyed using  liquid nitrogen: Yes   Region frozen until ice ball extended beyond lesion: Yes   Outcome: patient tolerated procedure well with no complications   Post-procedure details: wound care instructions given    Actinic Damage - chronic, secondary to cumulative UV radiation exposure/sun exposure over time - diffuse scaly erythematous macules with underlying dyspigmentation - Recommend daily broad  spectrum sunscreen SPF 30+ to sun-exposed areas, reapply every 2 hours as needed.  - Recommend staying in the shade or wearing long sleeves, sun glasses (UVA+UVB protection) and wide brim hats (4-inch brim around the entire circumference of the hat). - Call for new or changing lesions.  Return in about 1 year (around 08/18/2022) for Aks, Seborrheic dermatitis .  IMarye Round, CMA, am acting as scribe for Sarina Ser, MD .  Documentation: I have reviewed the above documentation for accuracy and completeness, and I agree with the above.  Sarina Ser, MD

## 2021-08-19 ENCOUNTER — Encounter: Payer: Self-pay | Admitting: Dermatology

## 2021-08-22 ENCOUNTER — Other Ambulatory Visit: Payer: Self-pay | Admitting: Urology

## 2021-09-24 ENCOUNTER — Other Ambulatory Visit: Payer: Self-pay | Admitting: Urology

## 2021-10-23 DIAGNOSIS — I2510 Atherosclerotic heart disease of native coronary artery without angina pectoris: Secondary | ICD-10-CM | POA: Diagnosis not present

## 2021-10-23 DIAGNOSIS — I480 Paroxysmal atrial fibrillation: Secondary | ICD-10-CM | POA: Diagnosis not present

## 2021-10-23 DIAGNOSIS — I255 Ischemic cardiomyopathy: Secondary | ICD-10-CM | POA: Diagnosis not present

## 2021-10-23 DIAGNOSIS — I10 Essential (primary) hypertension: Secondary | ICD-10-CM | POA: Diagnosis not present

## 2021-10-24 ENCOUNTER — Other Ambulatory Visit: Payer: Self-pay | Admitting: Urology

## 2021-10-25 ENCOUNTER — Other Ambulatory Visit: Payer: Self-pay | Admitting: Neurology

## 2021-11-25 ENCOUNTER — Other Ambulatory Visit: Payer: Self-pay

## 2021-11-25 ENCOUNTER — Other Ambulatory Visit: Payer: Self-pay | Admitting: Neurology

## 2021-11-25 MED ORDER — DONEPEZIL HCL 10 MG PO TABS: 10.0000 mg | ORAL_TABLET | Freq: Every day | ORAL | 0 refills | Status: DC

## 2021-12-03 ENCOUNTER — Ambulatory Visit: Payer: PPO | Admitting: Orthopaedic Surgery

## 2021-12-09 ENCOUNTER — Ambulatory Visit (INDEPENDENT_AMBULATORY_CARE_PROVIDER_SITE_OTHER): Payer: Medicare Other

## 2021-12-09 ENCOUNTER — Ambulatory Visit: Payer: Medicare Other | Admitting: Orthopaedic Surgery

## 2021-12-09 ENCOUNTER — Encounter: Payer: Self-pay | Admitting: Orthopaedic Surgery

## 2021-12-09 DIAGNOSIS — Z96651 Presence of right artificial knee joint: Secondary | ICD-10-CM

## 2021-12-09 NOTE — Progress Notes (Signed)
? ?Office Visit Note ?  ?Patient: MARTEN ILES           ?Date of Birth: [birth year]           ?MRN: [id number] ?Visit Date: 12/09/2021 ?             ?Requested by: Shon Baton, MD ?[address] ?[place],  [place] [address] ?PCP: Shon Baton, MD ? ? ?Assessment & Plan: ?Visit Diagnoses:  ?1. Hx of total knee replacement, right   ? ? ?Plan: Mr. Mcelreath is 2 years status post right total knee replacement.  He is doing well has no complaints or pain.  He is functional and back to baseline activity.  Examination of his right knee shows a fully healed surgical scar.  Stable to varus valgus.  Excellent range of motion.  X-rays show no problems with the implants.  At this point he can follow-up with [place] as needed.  Dental prophylaxis no longer needed. ? ?Follow-Up Instructions: No follow-ups on file.  ? ?Orders:  ?Orders Placed This Encounter  ?Procedures  ? XR Knee 1-2 Views Right  ? ?No orders of the defined types were placed in this encounter. ? ? ? ? Procedures: ?No procedures performed ? ? ?Clinical Data: ?No additional findings. ? ? ?Subjective: ?Chief Complaint  ?Patient presents with  ? Right Knee - Pain  ? ? ?HPI ? ?Review of Systems ? ? ?Objective: ?Vital Signs: There were no vitals taken for this visit. ? ?Physical Exam ? ?Ortho Exam ? ?Specialty Comments:  ?No specialty comments available. ? ?Imaging: ?XR Knee 1-2 Views Right ? ?Result Date: 12/09/2021 ?Stable total knee replacement in good alignment   ? ? ?PMFS History: ?Patient Active Problem List  ? Diagnosis Date Noted  ? Depressed skull fracture, sequela ([place]) 12/11/2020  ? Alzheimer's disease ([place]) 12/11/2020  ? Memory loss 08/13/2020  ? Vitamin D deficiency 08/13/2020  ? Dementia with behavioral disturbance ([place]) 08/13/2020  ? Status post total right knee replacement 12/04/2019  ? Primary osteoarthritis of right knee 08/16/2019  ? Iron deficiency anemia, unspecified 02/09/2019  ? Ischemic colitis ([place]) 02/09/2019  ? Hyperlipidemia 04/20/2018  ? Total knee  replacement status 04/08/2016  ? Abnormal finding of diagnostic imaging 02/28/2016  ? Sepsis ([place]) 07/25/2015  ? On continuous oral anticoagulation 07/25/2015  ? Essential hypertension 07/25/2015  ? GERD (gastroesophageal reflux disease) 07/25/2015  ? Acute upper respiratory infection 07/25/2015  ? Bladder outlet obstruction 05/28/2015  ? Incarcerated ventral hernia 03/14/2015  ? Atrial fibrillation with rapid ventricular response ([place])   ? Chronic coronary artery disease   ? Elevated troponin   ? Patient receiving intravenous heparin therapy   ? SBO (small bowel obstruction) ([place]) 03/08/2015  ? Male urinary stress incontinence 08/17/2012  ? ED (erectile dysfunction) of organic origin 03/30/2012  ? Incomplete emptying of bladder 03/30/2012  ? Inguinal hernia 03/30/2012  ? Kidney stone 03/30/2012  ? Malignant neoplasm of prostate ([place]) 03/30/2012  ? ?Past Medical History:  ?Diagnosis Date  ? Actinic keratosis   ? Aortic atherosclerosis ([place])   ? Arthritis   ? Coronary artery disease   ? COVID-19   ? DDD (degenerative disc disease), lumbar   ? with associated multilevel impingement  ? Diastolic heart failure ([place])   ? Diverticulosis   ? GERD (gastroesophageal reflux disease)   ? Headache   ? History of hiatal hernia   ? History of kidney stones   ? HLD (hyperlipidemia)   ? Hypertension   ? IDA (  iron deficiency anemia)   ? Lumbar spondylolysis   ? Myelolipoma of right adrenal gland   ? Paroxysmal atrial fibrillation (HCC)   ? Prostate cancer ([place])   ? Seborrheic dermatitis   ? Shortness of breath dyspnea   ? Subendocardial myocardial infarction [facility]) 07/2009  ? PCI with DES x 1 placed to LAD  ?  ?History reviewed. No pertinent family history.  ?Past Surgical History:  ?Procedure Laterality Date  ? CARDIAC CATHETERIZATION    ? 2009, stent placed  ? CARDIOVERSION N/A 03/13/2015  ? Procedure: CARDIOVERSION;  Surgeon: Jerline Pain, MD;  Location: [facility] ENDOSCOPY;  Service: Cardiovascular;  Laterality: N/A;  ? COLONOSCOPY WITH  PROPOFOL N/A 07/19/2015  ? Procedure: COLONOSCOPY WITH PROPOFOL;  Surgeon: Manya Silvas, MD;  Location: [facility] ENDOSCOPY;  Service: Endoscopy;  Laterality: N/A;  ? COLONOSCOPY WITH PROPOFOL N/A 01/27/2019  ? Procedure: COLONOSCOPY WITH PROPOFOL;  Surgeon: Manya Silvas, MD;  Location: [facility] ENDOSCOPY;  Service: Endoscopy;  Laterality: N/A;  ? cryo surgery of prostate    ? CYSTOSCOPY WITH BIOPSY N/A 11/19/2020  ? Procedure: CYSTOSCOPY WITH POSSIBLE BLADDER BIOPSY;  Surgeon: Abbie Sons, MD;  Location: [facility] ORS;  Service: Urology;  Laterality: N/A;  ? ESOPHAGOGASTRODUODENOSCOPY N/A 01/27/2019  ? Procedure: ESOPHAGOGASTRODUODENOSCOPY (EGD);  Surgeon: Manya Silvas, MD;  Location: [facility] ENDOSCOPY;  Service: Endoscopy;  Laterality: N/A;  ? ESOPHAGOGASTRODUODENOSCOPY (EGD) WITH PROPOFOL N/A 07/19/2015  ? Procedure: ESOPHAGOGASTRODUODENOSCOPY (EGD) WITH PROPOFOL;  Surgeon: Manya Silvas, MD;  Location: [facility] ENDOSCOPY;  Service: Endoscopy;  Laterality: N/A;  ? JOINT REPLACEMENT    ? both knees  ? LAPAROTOMY N/A 03/08/2015  ? Procedure: EXPLORATORY LAPAROTOMY;  Surgeon: Autumn Messing III, MD;  Location: [place];  Service: General;  Laterality: N/A;  ? LAPAROTOMY N/A 03/09/2015  ? Procedure: EXPLORATORY LAPAROTOMY AND EVACUATION OF HEMATOMA;  Surgeon: Autumn Messing III, MD;  Location: [place];  Service: General;  Laterality: N/A;  ? LYSIS OF ADHESION N/A 03/08/2015  ? Procedure: LYSIS OF ADHESION;  Surgeon: Autumn Messing III, MD;  Location: [place];  Service: General;  Laterality: N/A;  ? OMENTECTOMY N/A 03/08/2015  ? Procedure: OMENTECTOMY;  Surgeon: Autumn Messing III, MD;  Location: [place];  Service: General;  Laterality: N/A;  ? TEE WITHOUT CARDIOVERSION N/A 03/13/2015  ? Procedure: TRANSESOPHAGEAL ECHOCARDIOGRAM (TEE);  Surgeon: Jerline Pain, MD;  Location: [place];  Service: Cardiovascular;  Laterality: N/A;  ? TOTAL KNEE ARTHROPLASTY Left 04/08/2016  ? Procedure: LEFT TOTAL KNEE ARTHROPLASTY;  Surgeon: Leandrew Koyanagi, MD;  Location: [place];  Service: Orthopedics;  Laterality: Left;  ? TOTAL KNEE ARTHROPLASTY Right 12/04/2019  ? Procedure: RIGHT TOTAL KNEE ARTHROPLASTY;  Surgeon: Leandrew Koyanagi, MD;  Location: [place];  Service: Orthopedics;  Laterality: Right;  ? UMBILICAL HERNIA REPAIR  03/08/2015  ? Procedure: UMBILICAL HERNIA REPAIR  ADULT;  Surgeon: Autumn Messing III, MD;  Location: [place];  Service: General;;  ? VENTRAL HERNIA REPAIR N/A 03/08/2015  ? Procedure:  VENTRAL HERNIA  REPAIR ADULT;  Surgeon: Autumn Messing III, MD;  Location: [place];  Service: General;  Laterality: N/A;  ? ?Social History  ? ?Occupational History  ? Not on file  ?Tobacco Use  ? Smoking status: Former  ?  Years: 20.00  ?  Types: Cigarettes  ?  Quit date: 08/03/1988  ?  Years since quitting: 33.3  ? Smokeless tobacco: Never  ?Vaping Use  ? Vaping Use: Never used  ?Substance and Sexual Activity  ?  Alcohol use: No  ? Drug use: No  ? Sexual activity: Yes  ? ? ? ? ? ? ?

## 2021-12-23 ENCOUNTER — Other Ambulatory Visit: Payer: Self-pay | Admitting: Neurology

## 2022-01-19 NOTE — Progress Notes (Unsigned)
01/20/2022 2:56 PM   Jim Dodson [birth year] [id number]  Referring provider: Shon Baton, MD [address] [place],  [place] [address]  Urological history: 1. Prostate cancer -PSA pending  -intermediate risk prostate cancer -cryoablation, 2008 -salvage radiation, 2019  2. Urge incontinence -Contributing factors of age, prostate cancer, pelvic radiation, hypertension, CAD, CHF, Alzheimer's, arthritis, lumbar degenerative disc disease, obesity, history of smoking and antihistamines -Refractory to tamsulosin, beta 3 agonist and anticholinergic medication -PVR 13 mL  3. High risk hematuria -former smoker -CTU 2022 - bilateral nephrolithiasis -cysto, 2022 - Left mid bladder base posterior to trigone is an erythematous lesion which may represent a small diverticulum -bladder biopsy 11/2020 for area of erythema which was inflammatory and no evidence of cancer/CIS  4. BPH with LU TS - I PSS 11/6   Chief Complaint  Patient presents with   Urinary Incontinence    HPI: Jim Dodson is a 81 y.o. male who presents today to discuss PTNS with his wife, Jim Dodson.   He is wearing one pad a day.  He is changing the pad 1 to 2 during the day when it is soaked.   He is waking up ~ 3 to 4 in the am to urinate and he changes his pad because it is damp.  He then wakes up in the morning and he pad is soaked.    He can go 3 to 4 hours during the day without the urge to urinate and minimal incontinence.    He drinks diet Cokes.  He does not drink a lot of water, juices or energy drinks.  He does not drink alcohol.    Patient denies any modifying or aggravating factors.  Patient denies any gross hematuria, dysuria or suprapubic/flank pain.  Patient denies any fevers, chills, nausea or vomiting.    His wife takes care of his medicines and they are now on bubble pack.  She informed the CMA that he was no longer taking the trospium, but when you look at the refill pattern in epic both Flomax  and trapezium have been refilled recently.   IPSS     Row Name 01/20/22 1400         International Prostate Symptom Score   How often have you had the sensation of not emptying your bladder? Not at All     How often have you had to urinate less than every two hours? Less than half the time     How often have you found you stopped and started again several times when you urinated? Less than 1 in 5 times     How often have you found it difficult to postpone urination? Almost always     How often have you had a weak urinary stream? Less than 1 in 5 times     How often have you had to strain to start urination? Not at All     How many times did you typically get up at night to urinate? 2 Times     Total IPSS Score 11       Quality of Life due to urinary symptoms   If you were to spend the rest of your life with your urinary condition just the way it is now how would you feel about that? Terrible              Score:  1-7 Mild 8-19 Moderate 20-35 Severe     PMH: Past Medical History:  Diagnosis Date  Actinic keratosis    Aortic atherosclerosis (HCC)    Arthritis    Coronary artery disease    COVID-19    DDD (degenerative disc disease), lumbar    with associated multilevel impingement   Diastolic heart failure (HCC)    Diverticulosis    GERD (gastroesophageal reflux disease)    Headache    History of hiatal hernia    History of kidney stones    HLD (hyperlipidemia)    Hypertension    IDA (iron deficiency anemia)    Lumbar spondylolysis    Myelolipoma of right adrenal gland    Paroxysmal atrial fibrillation (HCC)    Prostate cancer (HCC)    Seborrheic dermatitis    Shortness of breath dyspnea    Subendocardial myocardial infarction [facility]) 07/2009   PCI with DES x 1 placed to LAD    Surgical History: Past Surgical History:  Procedure Laterality Date   CARDIAC CATHETERIZATION     2009, stent placed   CARDIOVERSION N/A 03/13/2015   Procedure: CARDIOVERSION;   Surgeon: Jerline Pain, MD;  Location: [place];  Service: Cardiovascular;  Laterality: N/A;   COLONOSCOPY WITH PROPOFOL N/A 07/19/2015   Procedure: COLONOSCOPY WITH PROPOFOL;  Surgeon: Manya Silvas, MD;  Location: [facility] ENDOSCOPY;  Service: Endoscopy;  Laterality: N/A;   COLONOSCOPY WITH PROPOFOL N/A 01/27/2019   Procedure: COLONOSCOPY WITH PROPOFOL;  Surgeon: Manya Silvas, MD;  Location: [facility] ENDOSCOPY;  Service: Endoscopy;  Laterality: N/A;   cryo surgery of prostate     CYSTOSCOPY WITH BIOPSY N/A 11/19/2020   Procedure: CYSTOSCOPY WITH POSSIBLE BLADDER BIOPSY;  Surgeon: Abbie Sons, MD;  Location: [facility] ORS;  Service: Urology;  Laterality: N/A;   ESOPHAGOGASTRODUODENOSCOPY N/A 01/27/2019   Procedure: ESOPHAGOGASTRODUODENOSCOPY (EGD);  Surgeon: Manya Silvas, MD;  Location[facility] ENDOSCOPY;  Service: Endoscopy;  Laterality: N/A;   ESOPHAGOGASTRODUODENOSCOPY (EGD) WITH PROPOFOL N/A 07/19/2015   Procedure: ESOPHAGOGASTRODUODENOSCOPY (EGD) WITH PROPOFOL;  Surgeon: Manya Silvas, MD;  Location: [facility] ENDOSCOPY;  Service: Endoscopy;  Laterality: N/A;   JOINT REPLACEMENT     both knees   LAPAROTOMY N/A 03/08/2015   Procedure: EXPLORATORY LAPAROTOMY;  Surgeon: Autumn Messing III, MD;  Location: [place];  Service: General;  Laterality: N/A;   LAPAROTOMY N/A 03/09/2015   Procedure: EXPLORATORY LAPAROTOMY AND EVACUATION OF HEMATOMA;  Surgeon: Autumn Messing III, MD;  Location: [place];  Service: General;  Laterality: N/A;   LYSIS OF ADHESION N/A 03/08/2015   Procedure: LYSIS OF ADHESION;  Surgeon: Autumn Messing III, MD;  Location: [place];  Service: General;  Laterality: N/A;   OMENTECTOMY N/A 03/08/2015   Procedure: OMENTECTOMY;  Surgeon: Autumn Messing III, MD;  Location: [place];  Service: General;  Laterality: N/A;   TEE WITHOUT CARDIOVERSION N/A 03/13/2015   Procedure: TRANSESOPHAGEAL ECHOCARDIOGRAM (TEE);  Surgeon: Jerline Pain, MD;  Location: [place];  Service: Cardiovascular;  Laterality: N/A;   TOTAL  KNEE ARTHROPLASTY Left 04/08/2016   Procedure: LEFT TOTAL KNEE ARTHROPLASTY;  Surgeon: Leandrew Koyanagi, MD;  Location: [place];  Service: Orthopedics;  Laterality: Left;   TOTAL KNEE ARTHROPLASTY Right 12/04/2019   Procedure: RIGHT TOTAL KNEE ARTHROPLASTY;  Surgeon: Leandrew Koyanagi, MD;  Location: [place];  Service: Orthopedics;  Laterality: Right;   UMBILICAL HERNIA REPAIR  03/08/2015   Procedure: UMBILICAL HERNIA REPAIR  ADULT;  Surgeon: Autumn Messing III, MD;  Location: [place];  Service: General;;   VENTRAL HERNIA REPAIR N/A 03/08/2015   Procedure:  VENTRAL HERNIA  REPAIR  ADULT;  Surgeon: Autumn Messing III, MD;  Location: [place];  Service: General;  Laterality: N/A;    Home Medications:  Allergies as of 01/20/2022       Reactions   Statins Swelling, Other (See Comments)   Legs swelled up and cramped up really bad        Medication List        Accurate as of January 20, 2022  2:56 PM. If you have any questions, ask your nurse or doctor.          Acetaminophen 500 MG capsule Take by mouth.   ALLERGY RELIEF PO Take 1 Dose by mouth daily. Gets [employer] brand   amoxicillin 500 MG capsule Commonly known as: AMOXIL TAKE 4 CAPSULES BY MOUTH 1 HOUR PRIOR TOPROCEDURE.   amoxicillin 500 MG tablet Commonly known as: AMOXIL take 4 tabs po one hour prior to dental work.   ANTI-DIARRHEAL PO Take 1 Dose by mouth as needed. Gets [employer] brand   apixaban 5 MG Tabs tablet Commonly known as: ELIQUIS Take 5 mg by mouth 2 (two) times daily.   donepezil 10 MG tablet Commonly known as: ARICEPT Take 1 tablet (10 mg total) by mouth at bedtime.   ferrous sulfate 325 (65 FE) MG tablet Take 325 mg by mouth in the morning and at bedtime.   hydrALAZINE 25 MG tablet Commonly known as: APRESOLINE Take 1 tablet by mouth 3 (three) times daily.   hydrochlorothiazide 25 MG tablet Commonly known as: HYDRODIURIL Take 25 mg by mouth daily.   hydrocortisone 2.5 % lotion Apply topically as directed. Apply  to scaly areas on face and chest 3 nights per week Monday, Wednesday and fridays prn flares   ketoconazole 2 % cream Commonly known as: NIZORAL Apply 1 application topically 3 (three) times a week. Tues, Thurs, Sat at night   meclizine 12.5 MG tablet Commonly known as: ANTIVERT Take 12.5 mg by mouth daily.   memantine 10 MG tablet Commonly known as: NAMENDA Take 10 mg by mouth 2 (two) times daily.   metoprolol tartrate 100 MG tablet Commonly known as: LOPRESSOR Take 1 tablet (100 mg total) by mouth 2 (two) times daily.   omeprazole 20 MG capsule Commonly known as: PRILOSEC Take 20 mg by mouth daily.   Red Yeast Rice Extract 600 MG Caps Take 600 mg by mouth 2 (two) times daily.   REFRESH OPTIVE PF OP Place 1 drop into both eyes 2 (two) times daily as needed (dry eyes).   tamsulosin 0.4 MG Caps capsule Commonly known as: FLOMAX TAKE 1 CAPSULE BY MOUTH TWICE DAILY   trospium 20 MG tablet Commonly known as: SANCTURA Take 1 tablet (20 mg total) by mouth 2 (two) times daily.   Vitamin D3 125 MCG (5000 UT) Tabs Take 5,000 Units by mouth daily.        Allergies:  Allergies  Allergen Reactions   Statins Swelling and Other (See Comments)    Legs swelled up and cramped up really bad    Family History: No family history on file.  Social History:  reports that he quit smoking about 33 years ago. His smoking use included cigarettes. He has never used smokeless tobacco. He reports that he does not drink alcohol and does not use drugs.  ROS: Pertinent ROS in HPI  Physical Exam: BP 127/81   Pulse 83   Ht 5' 7" (1.702 m)   Wt 201 lb (91.2 kg)   BMI 31.48 kg/m  Constitutional:  Well nourished. Alert and oriented, No acute distress. HEENT: [place] AT, moist mucus membranes.  Trachea midline Cardiovascular: No clubbing, cyanosis, or edema. Respiratory: Normal respiratory effort, no increased work of breathing. Neurologic: Grossly intact, no focal deficits, moving all 4  extremities. Psychiatric: Normal mood and affect.  Laboratory Data: Glucose 70 - 110 mg/dL 102   Sodium 136 - 145 mmol/L 140   Potassium 3.6 - 5.1 mmol/L 3.6   Chloride 97 - 109 mmol/L 100   Carbon Dioxide (CO2) 22.0 - 32.0 mmol/L 35.6 High    Calcium 8.7 - 10.3 mg/dL 9.6   Urea Nitrogen (BUN) 7 - 25 mg/dL 15   Creatinine 0.7 - 1.3 mg/dL 1.2   Glomerular Filtration Rate (eGFR), MDRD Estimate >60 mL/min/1.73sq m 58 Low    BUN/Crea Ratio 6.0 - 20.0 12.5   Anion Gap w/K 6.0 - 16.0 8.0   Resulting Agency  [place] - [facility]   Specimen Collected: 10/23/21 17:02 Last Resulted: 10/23/21 17:43  Received From: [facility]  Result Received: 10/24/21 12:32   Cholesterol, Total 100 - 200 mg/dL 174   Triglyceride 35 - 199 mg/dL 137   HDL (High Density Lipoprotein) Cholesterol 29.0 - 71.0 mg/dL 37.1   LDL Calculated 0 - 130 mg/dL 110   VLDL Cholesterol mg/dL 27   Cholesterol/HDL Ratio  4.7   Resulting Agency  [place] - [facility]   Specimen Collected: 10/23/21 17:02 Last Resulted: 10/23/21 17:43  Received From: [facility]  Result Received: 10/24/21 12:32  I have reviewed the labs.   Pertinent Imaging:  01/20/22 14:14  Scan Result 74m    Assessment & Plan:    1. Prostate cancer -PSA pending  2. Urge incontinence -Neither he and his wife had any idea concerning PTNS, so the appointment was not to discuss PTNS but more of his bladder leakage -He is somewhat a difficult historian, but it appears that he mainly consumes Diet Coke and having some episodes of urge incontinence and incontinence without his awareness at night -According to epic he has filled both the trospium and tamsulosin recently, so I recommended that he discontinue the tamsulosin at this time and just stay on the trospium  Return in about 1 month (around 02/19/2022) for IPSS and PVR.  These notes generated with voice recognition software. I apologize for typographical  errors.  SZara Council PA-C  [facility] [address] [place] [phone]

## 2022-01-20 ENCOUNTER — Ambulatory Visit: Payer: Medicare Other | Admitting: Urology

## 2022-01-20 ENCOUNTER — Encounter: Payer: Self-pay | Admitting: Urology

## 2022-01-20 VITALS — BP 127/81 | HR 83 | Ht 67.0 in | Wt 201.0 lb

## 2022-01-20 DIAGNOSIS — Z8546 Personal history of malignant neoplasm of prostate: Secondary | ICD-10-CM

## 2022-01-20 DIAGNOSIS — N3941 Urge incontinence: Secondary | ICD-10-CM

## 2022-01-20 DIAGNOSIS — C61 Malignant neoplasm of prostate: Secondary | ICD-10-CM | POA: Diagnosis not present

## 2022-01-20 LAB — BLADDER SCAN AMB NON-IMAGING

## 2022-01-20 NOTE — Patient Instructions (Addendum)
Do not take the tamsulosin any more.    Continue the trospium 20 mg twice daily.

## 2022-01-21 ENCOUNTER — Telehealth: Payer: Self-pay

## 2022-01-21 LAB — PSA: Prostate Specific Ag, Serum: <0.1 ng/mL (ref 0.0–4.0)

## 2022-01-21 NOTE — Telephone Encounter (Signed)
Ok per DPR, [facility] notifying pt as advised.

## 2022-01-21 NOTE — Telephone Encounter (Signed)
-----   Message from Nori Riis, PA-C sent at 01/21/2022  7:12 AM EDT ----- Please let Mr. Mcmains know that his PSA remains undetectable.

## 2022-01-26 ENCOUNTER — Other Ambulatory Visit: Payer: Self-pay | Admitting: Neurology

## 2022-01-26 ENCOUNTER — Telehealth: Payer: Self-pay | Admitting: Neurology

## 2022-01-26 DIAGNOSIS — H2513 Age-related nuclear cataract, bilateral: Secondary | ICD-10-CM | POA: Diagnosis not present

## 2022-01-26 DIAGNOSIS — H524 Presbyopia: Secondary | ICD-10-CM | POA: Diagnosis not present

## 2022-01-26 MED ORDER — DONEPEZIL HCL 10 MG PO TABS: 10.0000 mg | ORAL_TABLET | Freq: Every day | ORAL | 0 refills | Status: DC

## 2022-01-27 ENCOUNTER — Other Ambulatory Visit: Payer: Self-pay | Admitting: Neurology

## 2022-01-28 ENCOUNTER — Ambulatory Visit: Payer: PPO | Admitting: Neurology

## 2022-01-28 ENCOUNTER — Encounter: Payer: Self-pay | Admitting: Neurology

## 2022-02-18 NOTE — Progress Notes (Deleted)
02/19/2022 1:26 PM   Jim Dodson 1941-03-30 [id number]  Referring provider: Shon Baton, MD [address] [place],  [place] [address]  Urological history: 1. Prostate cancer -PSA, 01/2022 - <0.1 -intermediate risk prostate cancer -cryoablation, 2008 -salvage radiation, 2019  2. Urge incontinence -Contributing factors of age, prostate cancer, pelvic radiation, hypertension, CAD, CHF, Alzheimer's, arthritis, lumbar degenerative disc disease, obesity, history of smoking and antihistamines -Refractory to tamsulosin, beta 3 agonist and anticholinergic medication -PVR 13 mL  3. High risk hematuria -former smoker -CTU 2022 - bilateral nephrolithiasis -cysto, 2022 - Left mid bladder base posterior to trigone is an erythematous lesion which may represent a small diverticulum -bladder biopsy 11/2020 for area of erythema which was inflammatory and no evidence of cancer/CIS  4. BPH with LU TS - I PSS 11/6   No chief complaint on file.   HPI: Jim Dodson is a 81 y.o. male who presents today for 1 month follow-up after discontinuing tamsulosin and staying on the trospium with his wife, Jim Dodson.       Score:  1-7 Mild 8-19 Moderate 20-35 Severe     PMH: Past Medical History:  Diagnosis Date   Actinic keratosis    Aortic atherosclerosis (HCC)    Arthritis    Coronary artery disease    COVID-19    DDD (degenerative disc disease), lumbar    with associated multilevel impingement   Diastolic heart failure (HCC)    Diverticulosis    GERD (gastroesophageal reflux disease)    Headache    History of hiatal hernia    History of kidney stones    HLD (hyperlipidemia)    Hypertension    IDA (iron deficiency anemia)    Lumbar spondylolysis    Myelolipoma of right adrenal gland    Paroxysmal atrial fibrillation (HCC)    Prostate cancer (HCC)    Seborrheic dermatitis    Shortness of breath dyspnea    Subendocardial myocardial infarction [facility]) 07/2009   PCI with  DES x 1 placed to LAD    Surgical History: Past Surgical History:  Procedure Laterality Date   CARDIAC CATHETERIZATION     2009, stent placed   CARDIOVERSION N/A 03/13/2015   Procedure: CARDIOVERSION;  Surgeon: Jerline Pain, MD;  Location: [place];  Service: Cardiovascular;  Laterality: N/A;   COLONOSCOPY WITH PROPOFOL N/A 07/19/2015   Procedure: COLONOSCOPY WITH PROPOFOL;  Surgeon: Manya Silvas, MD;  Location: [facility] ENDOSCOPY;  Service: Endoscopy;  Laterality: N/A;   COLONOSCOPY WITH PROPOFOL N/A 01/27/2019   Procedure: COLONOSCOPY WITH PROPOFOL;  Surgeon: Manya Silvas, MD;  Location: [facility] ENDOSCOPY;  Service: Endoscopy;  Laterality: N/A;   cryo surgery of prostate     CYSTOSCOPY WITH BIOPSY N/A 11/19/2020   Procedure: CYSTOSCOPY WITH POSSIBLE BLADDER BIOPSY;  Surgeon: Abbie Sons, MD;  Location: [facility] ORS;  Service: Urology;  Laterality: N/A;   ESOPHAGOGASTRODUODENOSCOPY N/A 01/27/2019   Procedure: ESOPHAGOGASTRODUODENOSCOPY (EGD);  Surgeon: Manya Silvas, MD;  Location: [facility] ENDOSCOPY;  Service: Endoscopy;  Laterality: N/A;   ESOPHAGOGASTRODUODENOSCOPY (EGD) WITH PROPOFOL N/A 07/19/2015   Procedure: ESOPHAGOGASTRODUODENOSCOPY (EGD) WITH PROPOFOL;  Surgeon: Manya Silvas, MD;  Location: [facility] ENDOSCOPY;  Service: Endoscopy;  Laterality: N/A;   JOINT REPLACEMENT     both knees   LAPAROTOMY N/A 03/08/2015   Procedure: EXPLORATORY LAPAROTOMY;  Surgeon: Autumn Messing III, MD;  Location: [place];  Service: General;  Laterality: N/A;   LAPAROTOMY N/A 03/09/2015   Procedure: EXPLORATORY LAPAROTOMY AND EVACUATION OF  HEMATOMA;  Surgeon: Autumn Messing III, MD;  Location: [place];  Service: General;  Laterality: N/A;   LYSIS OF ADHESION N/A 03/08/2015   Procedure: LYSIS OF ADHESION;  Surgeon: Autumn Messing III, MD;  Location: [place];  Service: General;  Laterality: N/A;   OMENTECTOMY N/A 03/08/2015   Procedure: OMENTECTOMY;  Surgeon: Autumn Messing III, MD;  Location: [place];  Service: General;   Laterality: N/A;   TEE WITHOUT CARDIOVERSION N/A 03/13/2015   Procedure: TRANSESOPHAGEAL ECHOCARDIOGRAM (TEE);  Surgeon: Jerline Pain, MD;  Location: [place];  Service: Cardiovascular;  Laterality: N/A;   TOTAL KNEE ARTHROPLASTY Left 04/08/2016   Procedure: LEFT TOTAL KNEE ARTHROPLASTY;  Surgeon: Leandrew Koyanagi, MD;  Location: [place];  Service: Orthopedics;  Laterality: Left;   TOTAL KNEE ARTHROPLASTY Right 12/04/2019   Procedure: RIGHT TOTAL KNEE ARTHROPLASTY;  Surgeon: Leandrew Koyanagi, MD;  Location: [place];  Service: Orthopedics;  Laterality: Right;   UMBILICAL HERNIA REPAIR  03/08/2015   Procedure: UMBILICAL HERNIA REPAIR  ADULT;  Surgeon: Autumn Messing III, MD;  Location: [place];  Service: General;;   VENTRAL HERNIA REPAIR N/A 03/08/2015   Procedure:  VENTRAL HERNIA  REPAIR ADULT;  Surgeon: Autumn Messing III, MD;  Location: [place];  Service: General;  Laterality: N/A;    Home Medications:  Allergies as of 02/19/2022       Reactions   Statins Swelling, Other (See Comments)   Legs swelled up and cramped up really bad        Medication List        Accurate as of February 18, 2022  1:26 PM. If you have any questions, ask your nurse or doctor.          Acetaminophen 500 MG capsule Take by mouth.   ALLERGY RELIEF PO Take 1 Dose by mouth daily. Gets [employer] brand   amoxicillin 500 MG capsule Commonly known as: AMOXIL TAKE 4 CAPSULES BY MOUTH 1 HOUR PRIOR TOPROCEDURE.   amoxicillin 500 MG tablet Commonly known as: AMOXIL take 4 tabs po one hour prior to dental work.   ANTI-DIARRHEAL PO Take 1 Dose by mouth as needed. Gets [employer] brand   apixaban 5 MG Tabs tablet Commonly known as: ELIQUIS Take 5 mg by mouth 2 (two) times daily.   donepezil 10 MG tablet Commonly known as: ARICEPT Take 1 tablet (10 mg total) by mouth at bedtime.   ferrous sulfate 325 (65 FE) MG tablet Take 325 mg by mouth in the morning and at bedtime.   hydrALAZINE 25 MG tablet Commonly known as:  APRESOLINE Take 1 tablet by mouth 3 (three) times daily.   hydrochlorothiazide 25 MG tablet Commonly known as: HYDRODIURIL Take 25 mg by mouth daily.   hydrocortisone 2.5 % lotion Apply topically as directed. Apply to scaly areas on face and chest 3 nights per week Monday, Wednesday and fridays prn flares   ketoconazole 2 % cream Commonly known as: NIZORAL Apply 1 application topically 3 (three) times a week. Tues, Thurs, Sat at night   meclizine 12.5 MG tablet Commonly known as: ANTIVERT Take 12.5 mg by mouth daily.   memantine 10 MG tablet Commonly known as: NAMENDA Take 10 mg by mouth 2 (two) times daily.   metoprolol tartrate 100 MG tablet Commonly known as: LOPRESSOR Take 1 tablet (100 mg total) by mouth 2 (two) times daily.   omeprazole 20 MG capsule Commonly known as: PRILOSEC Take 20 mg by mouth daily.   Red  Yeast Rice Extract 600 MG Caps Take 600 mg by mouth 2 (two) times daily.   REFRESH OPTIVE PF OP Place 1 drop into both eyes 2 (two) times daily as needed (dry eyes).   tamsulosin 0.4 MG Caps capsule Commonly known as: FLOMAX TAKE 1 CAPSULE BY MOUTH TWICE DAILY   trospium 20 MG tablet Commonly known as: SANCTURA Take 1 tablet (20 mg total) by mouth 2 (two) times daily.   Vitamin D3 125 MCG (5000 UT) Tabs Take 5,000 Units by mouth daily.        Allergies:  Allergies  Allergen Reactions   Statins Swelling and Other (See Comments)    Legs swelled up and cramped up really bad    Family History: No family history on file.  Social History:  reports that he quit smoking about 33 years ago. His smoking use included cigarettes. He has never used smokeless tobacco. He reports that he does not drink alcohol and does not use drugs.  ROS: Pertinent ROS in HPI  Physical Exam: There were no vitals taken for this visit.  Constitutional:  Well nourished. Alert and oriented, No acute distress. HEENT: [place] AT, moist mucus membranes.  Trachea  midline Cardiovascular: No clubbing, cyanosis, or edema. Respiratory: Normal respiratory effort, no increased work of breathing. GU: No CVA tenderness.  No bladder fullness or masses.  Patient with circumcised/uncircumcised phallus. ***Foreskin easily retracted***  Urethral meatus is patent.  No penile discharge. No penile lesions or rashes. Scrotum without lesions, cysts, rashes and/or edema.  Testicles are located scrotally bilaterally. No masses are appreciated in the testicles. Left and right epididymis are normal. Rectal: Patient with  normal sphincter tone. Anus and perineum without scarring or rashes. No rectal masses are appreciated. Prostate is approximately *** grams, *** nodules are appreciated. Seminal vesicles are normal. Neurologic: Grossly intact, no focal deficits, moving all 4 extremities. Psychiatric: Normal mood and affect.   Laboratory Data:  Prostate Specific Ag, Serum  Latest Ref Rng 0.0 - 4.0 ng/mL  03/10/2018 7.3 (H)   06/12/2020 <0.1   01/20/2022 <0.1     Legend: (H) High I have reviewed the labs.   Pertinent Imaging: ***   Assessment & Plan:    1. Prostate cancer -PSA undetectable  2. Urge incontinence -Neither he and his wife had any idea concerning PTNS, so the appointment was not to discuss PTNS but more of his bladder leakage -He is somewhat a difficult historian, but it appears that he mainly consumes Diet Coke and having some episodes of urge incontinence and incontinence without his awareness at night -According to epic he has filled both the trospium and tamsulosin recently, so I recommended that he discontinue the tamsulosin at this time and just stay on the trospium  No follow-ups on file.  These notes generated with voice recognition software. I apologize for typographical errors.  Zara Council, PA-C  [facility] [address]  [place], [place] [address] [phone]

## 2022-02-19 ENCOUNTER — Encounter: Payer: Self-pay | Admitting: Urology

## 2022-02-19 ENCOUNTER — Ambulatory Visit: Payer: Medicare Other | Admitting: Urology

## 2022-02-19 DIAGNOSIS — C61 Malignant neoplasm of prostate: Secondary | ICD-10-CM

## 2022-02-19 DIAGNOSIS — N3941 Urge incontinence: Secondary | ICD-10-CM

## 2022-02-19 NOTE — Progress Notes (Incomplete)
02/19/2022 1:26 PM   Jim Dodson 1941-07-09 [id number]  Referring provider: Shon Baton, MD [address] [place],  [place] [address]  Urological history: 1. Prostate cancer -PSA, 01/2022 - <0.1 -intermediate risk prostate cancer -cryoablation, 2008 -salvage radiation, 2019  2. Urge incontinence -Contributing factors of age, prostate cancer, pelvic radiation, hypertension, CAD, CHF, Alzheimer's, arthritis, lumbar degenerative disc disease, obesity, history of smoking and antihistamines -Refractory to tamsulosin, beta 3 agonist and anticholinergic medication -PVR 13 mL  3. High risk hematuria -former smoker -CTU 2022 - bilateral nephrolithiasis -cysto, 2022 - Left mid bladder base posterior to trigone is an erythematous lesion which may represent a small diverticulum -bladder biopsy 11/2020 for area of erythema which was inflammatory and no evidence of cancer/CIS  4. BPH with LU TS - I PSS 11/6   No chief complaint on file.   HPI: Jim Dodson is a 81 y.o. male who presents today for 1 month follow-up after discontinuing tamsulosin and staying on the trospium with his wife, Jim Dodson.       Score:  1-7 Mild 8-19 Moderate 20-35 Severe     PMH: Past Medical History:  Diagnosis Date   Actinic keratosis    Aortic atherosclerosis (HCC)    Arthritis    Coronary artery disease    COVID-19    DDD (degenerative disc disease), lumbar    with associated multilevel impingement   Diastolic heart failure (HCC)    Diverticulosis    GERD (gastroesophageal reflux disease)    Headache    History of hiatal hernia    History of kidney stones    HLD (hyperlipidemia)    Hypertension    IDA (iron deficiency anemia)    Lumbar spondylolysis    Myelolipoma of right adrenal gland    Paroxysmal atrial fibrillation (HCC)    Prostate cancer (HCC)    Seborrheic dermatitis    Shortness of breath dyspnea    Subendocardial myocardial infarction [facility]) 07/2009   PCI with  DES x 1 placed to LAD    Surgical History: Past Surgical History:  Procedure Laterality Date   CARDIAC CATHETERIZATION     2009, stent placed   CARDIOVERSION N/A 03/13/2015   Procedure: CARDIOVERSION;  Surgeon: Jerline Pain, MD;  Location: [place];  Service: Cardiovascular;  Laterality: N/A;   COLONOSCOPY WITH PROPOFOL N/A 07/19/2015   Procedure: COLONOSCOPY WITH PROPOFOL;  Surgeon: Manya Silvas, MD;  Location: [facility] ENDOSCOPY;  Service: Endoscopy;  Laterality: N/A;   COLONOSCOPY WITH PROPOFOL N/A 01/27/2019   Procedure: COLONOSCOPY WITH PROPOFOL;  Surgeon: Manya Silvas, MD;  Location: [facility] ENDOSCOPY;  Service: Endoscopy;  Laterality: N/A;   cryo surgery of prostate     CYSTOSCOPY WITH BIOPSY N/A 11/19/2020   Procedure: CYSTOSCOPY WITH POSSIBLE BLADDER BIOPSY;  Surgeon: Abbie Sons, MD;  Location: [facility];  Service: Urology;  Laterality: N/A;   ESOPHAGOGASTRODUODENOSCOPY N/A 01/27/2019   Procedure: ESOPHAGOGASTRODUODENOSCOPY (EGD);  Surgeon: Manya Silvas, MD;  Location[facility] ENDOSCOPY;  Service: Endoscopy;  Laterality: N/A;   ESOPHAGOGASTRODUODENOSCOPY (EGD) WITH PROPOFOL N/A 07/19/2015   Procedure: ESOPHAGOGASTRODUODENOSCOPY (EGD) WITH PROPOFOL;  Surgeon: Manya Silvas, MD;  Location: [facility] ENDOSCOPY;  Service: Endoscopy;  Laterality: N/A;   JOINT REPLACEMENT     both knees   LAPAROTOMY N/A 03/08/2015   Procedure: EXPLORATORY LAPAROTOMY;  Surgeon: Autumn Messing III, MD;  Location: [place];  Service: General;  Laterality: N/A;   LAPAROTOMY N/A 03/09/2015   Procedure: EXPLORATORY LAPAROTOMY AND EVACUATION OF  HEMATOMA;  Surgeon: Autumn Messing III, MD;  Location: [employer];  Service: General;  Laterality: N/A;   LYSIS OF ADHESION N/A 03/08/2015   Procedure: LYSIS OF ADHESION;  Surgeon: Autumn Messing III, MD;  Location: [place];  Service: General;  Laterality: N/A;   OMENTECTOMY N/A 03/08/2015   Procedure: OMENTECTOMY;  Surgeon: Autumn Messing III, MD;  Location: [place];  Service: General;   Laterality: N/A;   TEE WITHOUT CARDIOVERSION N/A 03/13/2015   Procedure: TRANSESOPHAGEAL ECHOCARDIOGRAM (TEE);  Surgeon: Jerline Pain, MD;  Location: [place];  Service: Cardiovascular;  Laterality: N/A;   TOTAL KNEE ARTHROPLASTY Left 04/08/2016   Procedure: LEFT TOTAL KNEE ARTHROPLASTY;  Surgeon: Leandrew Koyanagi, MD;  Location: [place];  Service: Orthopedics;  Laterality: Left;   TOTAL KNEE ARTHROPLASTY Right 12/04/2019   Procedure: RIGHT TOTAL KNEE ARTHROPLASTY;  Surgeon: Leandrew Koyanagi, MD;  Location: [place];  Service: Orthopedics;  Laterality: Right;   UMBILICAL HERNIA REPAIR  03/08/2015   Procedure: UMBILICAL HERNIA REPAIR  ADULT;  Surgeon: Autumn Messing III, MD;  Location: [place];  Service: General;;   VENTRAL HERNIA REPAIR N/A 03/08/2015   Procedure:  VENTRAL HERNIA  REPAIR ADULT;  Surgeon: Autumn Messing III, MD;  Location: [place];  Service: General;  Laterality: N/A;    Home Medications:  Allergies as of 02/19/2022       Reactions   Statins Swelling, Other (See Comments)   Legs swelled up and cramped up really bad        Medication List        Accurate as of February 18, 2022  1:26 PM. If you have any questions, ask your nurse or doctor.          Acetaminophen 500 MG capsule Take by mouth.   ALLERGY RELIEF PO Take 1 Dose by mouth daily. Gets [employer] brand   amoxicillin 500 MG capsule Commonly known as: AMOXIL TAKE 4 CAPSULES BY MOUTH 1 HOUR PRIOR TOPROCEDURE.   amoxicillin 500 MG tablet Commonly known as: AMOXIL take 4 tabs po one hour prior to dental work.   ANTI-DIARRHEAL PO Take 1 Dose by mouth as needed. Gets [employer] brand   apixaban 5 MG Tabs tablet Commonly known as: ELIQUIS Take 5 mg by mouth 2 (two) times daily.   donepezil 10 MG tablet Commonly known as: ARICEPT Take 1 tablet (10 mg total) by mouth at bedtime.   ferrous sulfate 325 (65 FE) MG tablet Take 325 mg by mouth in the morning and at bedtime.   hydrALAZINE 25 MG tablet Commonly known as:  APRESOLINE Take 1 tablet by mouth 3 (three) times daily.   hydrochlorothiazide 25 MG tablet Commonly known as: HYDRODIURIL Take 25 mg by mouth daily.   hydrocortisone 2.5 % lotion Apply topically as directed. Apply to scaly areas on face and chest 3 nights per week Monday, Wednesday and fridays prn flares   ketoconazole 2 % cream Commonly known as: NIZORAL Apply 1 application topically 3 (three) times a week. Tues, Thurs, Sat at night   meclizine 12.5 MG tablet Commonly known as: ANTIVERT Take 12.5 mg by mouth daily.   memantine 10 MG tablet Commonly known as: NAMENDA Take 10 mg by mouth 2 (two) times daily.   metoprolol tartrate 100 MG tablet Commonly known as: LOPRESSOR Take 1 tablet (100 mg total) by mouth 2 (two) times daily.   omeprazole 20 MG capsule Commonly known as: PRILOSEC Take 20 mg by mouth daily.   Red  Yeast Rice Extract 600 MG Caps Take 600 mg by mouth 2 (two) times daily.   REFRESH OPTIVE PF OP Place 1 drop into both eyes 2 (two) times daily as needed (dry eyes).   tamsulosin 0.4 MG Caps capsule Commonly known as: FLOMAX TAKE 1 CAPSULE BY MOUTH TWICE DAILY   trospium 20 MG tablet Commonly known as: SANCTURA Take 1 tablet (20 mg total) by mouth 2 (two) times daily.   Vitamin D3 125 MCG (5000 UT) Tabs Take 5,000 Units by mouth daily.        Allergies:  Allergies  Allergen Reactions   Statins Swelling and Other (See Comments)    Legs swelled up and cramped up really bad    Family History: No family history on file.  Social History:  reports that he quit smoking about 33 years ago. His smoking use included cigarettes. He has never used smokeless tobacco. He reports that he does not drink alcohol and does not use drugs.  ROS: Pertinent ROS in HPI  Physical Exam: There were no vitals taken for this visit.  Constitutional:  Well nourished. Alert and oriented, No acute distress. HEENT: [place] AT, moist mucus membranes.  Trachea  midline Cardiovascular: No clubbing, cyanosis, or edema. Respiratory: Normal respiratory effort, no increased work of breathing. GU: No CVA tenderness.  No bladder fullness or masses.  Patient with circumcised/uncircumcised phallus. ***Foreskin easily retracted***  Urethral meatus is patent.  No penile discharge. No penile lesions or rashes. Scrotum without lesions, cysts, rashes and/or edema.  Testicles are located scrotally bilaterally. No masses are appreciated in the testicles. Left and right epididymis are normal. Rectal: Patient with  normal sphincter tone. Anus and perineum without scarring or rashes. No rectal masses are appreciated. Prostate is approximately *** grams, *** nodules are appreciated. Seminal vesicles are normal. Neurologic: Grossly intact, no focal deficits, moving all 4 extremities. Psychiatric: Normal mood and affect.   Laboratory Data:  Prostate Specific Ag, Serum  Latest Ref Rng 0.0 - 4.0 ng/mL  03/10/2018 7.3 (H)   06/12/2020 <0.1   01/20/2022 <0.1     Legend: (H) High I have reviewed the labs.   Pertinent Imaging: ***   Assessment & Plan:    1. Prostate cancer -PSA undetectable  2. Urge incontinence -Neither he and his wife had any idea concerning PTNS, so the appointment was not to discuss PTNS but more of his bladder leakage -He is somewhat a difficult historian, but it appears that he mainly consumes Diet Coke and having some episodes of urge incontinence and incontinence without his awareness at night -According to epic he has filled both the trospium and tamsulosin recently, so I recommended that he discontinue the tamsulosin at this time and just stay on the trospium  No follow-ups on file.  These notes generated with voice recognition software. I apologize for typographical errors.  Zara Council, PA-C  [facility] [address]  [place], [place] [address] [phone]

## 2022-02-23 ENCOUNTER — Other Ambulatory Visit: Payer: Self-pay | Admitting: Neurology

## 2022-03-24 ENCOUNTER — Other Ambulatory Visit: Payer: Self-pay | Admitting: Neurology

## 2022-04-23 ENCOUNTER — Encounter: Payer: Self-pay | Admitting: Neurology

## 2022-04-23 ENCOUNTER — Ambulatory Visit: Payer: Medicare Other | Admitting: Neurology

## 2022-04-23 VITALS — BP 131/89 | HR 84 | Ht 64.0 in | Wt 197.2 lb

## 2022-04-23 DIAGNOSIS — F0280 Dementia in other diseases classified elsewhere without behavioral disturbance: Secondary | ICD-10-CM | POA: Diagnosis not present

## 2022-04-23 DIAGNOSIS — S0291XS Unspecified fracture of skull, sequela: Secondary | ICD-10-CM

## 2022-04-23 DIAGNOSIS — G309 Alzheimer's disease, unspecified: Secondary | ICD-10-CM

## 2022-04-23 DIAGNOSIS — R413 Other amnesia: Secondary | ICD-10-CM

## 2022-04-23 MED ORDER — MEMANTINE HCL 10 MG PO TABS: 10.0000 mg | ORAL_TABLET | Freq: Two times a day (BID) | ORAL | 11 refills | Status: DC

## 2022-04-23 MED ORDER — DONEPEZIL HCL 10 MG PO TABS: 10.0000 mg | ORAL_TABLET | Freq: Every day | ORAL | 11 refills | Status: DC
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
## 2022-04-27 ENCOUNTER — Other Ambulatory Visit: Payer: Self-pay | Admitting: *Deleted

## 2022-04-27 MED ORDER — MEMANTINE HCL 10 MG PO TABS: 10.0000 mg | ORAL_TABLET | Freq: Two times a day (BID) | ORAL | 11 refills | Status: AC

## 2022-05-25 ENCOUNTER — Ambulatory Visit: Payer: PPO | Admitting: Neurology

## 2022-07-01 NOTE — Progress Notes (Incomplete)
02/19/2022 1:26 PM   Jim Dodson Jan 13, 1941 [id number]  Referring provider: Shon Baton, MD [address] [place],  [place] [address]  Urological history: 1. Prostate cancer -PSA, 01/2022 - <0.1 -intermediate risk prostate cancer -cryoablation, 2008 -salvage radiation, 2019  2. Urge incontinence -Contributing factors of age, prostate cancer, pelvic radiation, hypertension, CAD, CHF, Alzheimer's, arthritis, lumbar degenerative disc disease, obesity, history of smoking and antihistamines -Refractory to tamsulosin, beta 3 agonist and anticholinergic medication -PVR *** mL  3. High risk hematuria -former smoker -CTU 2022 - bilateral nephrolithiasis -cysto, 2022 - Left mid bladder base posterior to trigone is an erythematous lesion which may represent a small diverticulum -bladder biopsy 11/2020 for area of erythema which was inflammatory and no evidence of cancer/CIS  4. BPH with LU TS - I PSS *** - PVR ***   No chief complaint on file.   HPI: Jim Dodson is a 81 y.o. male who presents today for urinary leakage getting worse with his wife, [name].   I PSS ***  PVR ***  UA ***      Score:  1-7 Mild 8-19 Moderate 20-35 Severe     PMH: Past Medical History:  Diagnosis Date   Actinic keratosis    Aortic atherosclerosis (HCC)    Arthritis    Coronary artery disease    COVID-19    DDD (degenerative disc disease), lumbar    with associated multilevel impingement   Diastolic heart failure (HCC)    Diverticulosis    GERD (gastroesophageal reflux disease)    Headache    History of hiatal hernia    History of kidney stones    HLD (hyperlipidemia)    Hypertension    IDA (iron deficiency anemia)    Lumbar spondylolysis    Myelolipoma of right adrenal gland    Paroxysmal atrial fibrillation (HCC)    Prostate cancer (HCC)    Seborrheic dermatitis    Shortness of breath dyspnea    Subendocardial myocardial infarction [facility]) 07/2009   PCI with DES  x 1 placed to LAD    Surgical History: Past Surgical History:  Procedure Laterality Date   CARDIAC CATHETERIZATION     2009, stent placed   CARDIOVERSION N/A 03/13/2015   Procedure: CARDIOVERSION;  Surgeon: Jerline Pain, MD;  Location: [place];  Service: Cardiovascular;  Laterality: N/A;   COLONOSCOPY WITH PROPOFOL N/A 07/19/2015   Procedure: COLONOSCOPY WITH PROPOFOL;  Surgeon: Manya Silvas, MD;  Location: [facility] ENDOSCOPY;  Service: Endoscopy;  Laterality: N/A;   COLONOSCOPY WITH PROPOFOL N/A 01/27/2019   Procedure: COLONOSCOPY WITH PROPOFOL;  Surgeon: Manya Silvas, MD;  Location: [facility] ENDOSCOPY;  Service: Endoscopy;  Laterality: N/A;   cryo surgery of prostate     CYSTOSCOPY WITH BIOPSY N/A 11/19/2020   Procedure: CYSTOSCOPY WITH POSSIBLE BLADDER BIOPSY;  Surgeon: Abbie Sons, MD;  Location: [facility];  Service: Urology;  Laterality: N/A;   ESOPHAGOGASTRODUODENOSCOPY N/A 01/27/2019   Procedure: ESOPHAGOGASTRODUODENOSCOPY (EGD);  Surgeon: Manya Silvas, MD;  Location: [facility] ENDOSCOPY;  Service: Endoscopy;  Laterality: N/A;   ESOPHAGOGASTRODUODENOSCOPY (EGD) WITH PROPOFOL N/A 07/19/2015   Procedure: ESOPHAGOGASTRODUODENOSCOPY (EGD) WITH PROPOFOL;  Surgeon: Manya Silvas, MD;  Location: [facility] ENDOSCOPY;  Service: Endoscopy;  Laterality: N/A;   JOINT REPLACEMENT     both knees   LAPAROTOMY N/A 03/08/2015   Procedure: EXPLORATORY LAPAROTOMY;  Surgeon: Autumn Messing III, MD;  Location: [place];  Service: General;  Laterality: N/A;   LAPAROTOMY N/A 03/09/2015  Procedure: EXPLORATORY LAPAROTOMY AND EVACUATION OF HEMATOMA;  Surgeon: Autumn Messing III, MD;  Location: [place];  Service: General;  Laterality: N/A;   LYSIS OF ADHESION N/A 03/08/2015   Procedure: LYSIS OF ADHESION;  Surgeon: Autumn Messing III, MD;  Location: [place];  Service: General;  Laterality: N/A;   OMENTECTOMY N/A 03/08/2015   Procedure: OMENTECTOMY;  Surgeon: Autumn Messing III, MD;  Location: [place];  Service: General;  Laterality:  N/A;   TEE WITHOUT CARDIOVERSION N/A 03/13/2015   Procedure: TRANSESOPHAGEAL ECHOCARDIOGRAM (TEE);  Surgeon: Jerline Pain, MD;  Location: [place];  Service: Cardiovascular;  Laterality: N/A;   TOTAL KNEE ARTHROPLASTY Left 04/08/2016   Procedure: LEFT TOTAL KNEE ARTHROPLASTY;  Surgeon: Leandrew Koyanagi, MD;  Location: [place];  Service: Orthopedics;  Laterality: Left;   TOTAL KNEE ARTHROPLASTY Right 12/04/2019   Procedure: RIGHT TOTAL KNEE ARTHROPLASTY;  Surgeon: Leandrew Koyanagi, MD;  Location: [place];  Service: Orthopedics;  Laterality: Right;   UMBILICAL HERNIA REPAIR  03/08/2015   Procedure: UMBILICAL HERNIA REPAIR  ADULT;  Surgeon: Autumn Messing III, MD;  Location: [place];  Service: General;;   VENTRAL HERNIA REPAIR N/A 03/08/2015   Procedure:  VENTRAL HERNIA  REPAIR ADULT;  Surgeon: Autumn Messing III, MD;  Location: [place];  Service: General;  Laterality: N/A;    Home Medications:  Allergies as of 02/19/2022       Reactions   Statins Swelling, Other (See Comments)   Legs swelled up and cramped up really bad        Medication List        Accurate as of February 18, 2022  1:26 PM. If you have any questions, ask your nurse or doctor.          Acetaminophen 500 MG capsule Take by mouth.   ALLERGY RELIEF PO Take 1 Dose by mouth daily. Gets [employer] brand   amoxicillin 500 MG capsule Commonly known as: AMOXIL TAKE 4 CAPSULES BY MOUTH 1 HOUR PRIOR TOPROCEDURE.   amoxicillin 500 MG tablet Commonly known as: AMOXIL take 4 tabs po one hour prior to dental work.   ANTI-DIARRHEAL PO Take 1 Dose by mouth as needed. Gets [employer] brand   apixaban 5 MG Tabs tablet Commonly known as: ELIQUIS Take 5 mg by mouth 2 (two) times daily.   donepezil 10 MG tablet Commonly known as: ARICEPT Take 1 tablet (10 mg total) by mouth at bedtime.   ferrous sulfate 325 (65 FE) MG tablet Take 325 mg by mouth in the morning and at bedtime.   hydrALAZINE 25 MG tablet Commonly known as: APRESOLINE Take  1 tablet by mouth 3 (three) times daily.   hydrochlorothiazide 25 MG tablet Commonly known as: HYDRODIURIL Take 25 mg by mouth daily.   hydrocortisone 2.5 % lotion Apply topically as directed. Apply to scaly areas on face and chest 3 nights per week Monday, Wednesday and fridays prn flares   ketoconazole 2 % cream Commonly known as: NIZORAL Apply 1 application topically 3 (three) times a week. Tues, Thurs, Sat at night   meclizine 12.5 MG tablet Commonly known as: ANTIVERT Take 12.5 mg by mouth daily.   memantine 10 MG tablet Commonly known as: NAMENDA Take 10 mg by mouth 2 (two) times daily.   metoprolol tartrate 100 MG tablet Commonly known as: LOPRESSOR Take 1 tablet (100 mg total) by mouth 2 (two) times daily.   omeprazole 20 MG capsule Commonly known as: PRILOSEC Take 20 mg  by mouth daily.   Red Yeast Rice Extract 600 MG Caps Take 600 mg by mouth 2 (two) times daily.   REFRESH OPTIVE PF OP Place 1 drop into both eyes 2 (two) times daily as needed (dry eyes).   tamsulosin 0.4 MG Caps capsule Commonly known as: FLOMAX TAKE 1 CAPSULE BY MOUTH TWICE DAILY   trospium 20 MG tablet Commonly known as: SANCTURA Take 1 tablet (20 mg total) by mouth 2 (two) times daily.   Vitamin D3 125 MCG (5000 UT) Tabs Take 5,000 Units by mouth daily.        Allergies:  Allergies  Allergen Reactions   Statins Swelling and Other (See Comments)    Legs swelled up and cramped up really bad    Family History: No family history on file.  Social History:  reports that he quit smoking about 33 years ago. His smoking use included cigarettes. He has never used smokeless tobacco. He reports that he does not drink alcohol and does not use drugs.  ROS: Pertinent ROS in HPI  Physical Exam: There were no vitals taken for this visit.  Constitutional:  Well nourished. Alert and oriented, No acute distress. HEENT: [place] AT, moist mucus membranes.  Trachea midline Cardiovascular: No  clubbing, cyanosis, or edema. Respiratory: Normal respiratory effort, no increased work of breathing. GU: No CVA tenderness.  No bladder fullness or masses.  Patient with circumcised/uncircumcised phallus. ***Foreskin easily retracted***  Urethral meatus is patent.  No penile discharge. No penile lesions or rashes. Scrotum without lesions, cysts, rashes and/or edema.  Testicles are located scrotally bilaterally. No masses are appreciated in the testicles. Left and right epididymis are normal. Rectal: Patient with  normal sphincter tone. Anus and perineum without scarring or rashes. No rectal masses are appreciated. Prostate is approximately *** grams, *** nodules are appreciated. Seminal vesicles are normal. Neurologic: Grossly intact, no focal deficits, moving all 4 extremities. Psychiatric: Normal mood and affect.   Laboratory Data: Serum creatinine August 2023 1.1  I have reviewed the labs.   Pertinent Imaging: ***   Assessment & Plan:    1. Prostate cancer -PSA undetectable  2. Urge incontinence -Neither he and his wife had any idea concerning PTNS, so the appointment was not to discuss PTNS but more of his bladder leakage -He is somewhat a difficult historian, but it appears that he mainly consumes Diet Coke and having some episodes of urge incontinence and incontinence without his awareness at night -According to epic he has filled both the trospium and tamsulosin recently, so I recommended that he discontinue the tamsulosin at this time and just stay on the trospium  No follow-ups on file.  These notes generated with voice recognition software. I apologize for typographical errors.  [place], [place] [address]  [place], [place] [address] [phone]

## 2022-07-02 ENCOUNTER — Ambulatory Visit: Payer: Medicare Other | Admitting: Urology

## 2022-07-02 DIAGNOSIS — N3941 Urge incontinence: Secondary | ICD-10-CM

## 2022-07-02 DIAGNOSIS — C61 Malignant neoplasm of prostate: Secondary | ICD-10-CM

## 2022-07-07 NOTE — Progress Notes (Unsigned)
07/08/2022 8:57 PM   Jim Dodson Aug 05, 1940 [id number]  Referring provider: Shon Baton, MD [address] [place],   [address]  Urological history: 1. Prostate cancer -PSA, 01/2022 - <0.1 -intermediate risk prostate cancer -cryoablation, 2008 -salvage radiation, 2019  2. Urge incontinence -Contributing factors of age, prostate cancer, pelvic radiation, hypertension, CAD, CHF, Alzheimer's, arthritis, lumbar degenerative disc disease, obesity, history of smoking and antihistamines -Refractory to tamsulosin, beta 3 agonist and anticholinergic medication -PVR *** mL  3. High risk hematuria -former smoker -CTU 2022 - bilateral nephrolithiasis -cysto, 2022 - Left mid bladder base posterior to trigone is an erythematous lesion which may represent a small diverticulum -bladder biopsy 11/2020 for area of erythema which was inflammatory and no evidence of cancer/CIS  4. BPH with LU TS - I PSS *** - PVR ***   No chief complaint on file.   HPI: Jim Dodson is a 81 y.o. male who presents today for urinary leakage getting worse with his wife, Jim Dodson.   I PSS ***  PVR ***  UA ***      Score:  1-7 Mild 8-19 Moderate 20-35 Severe     PMH: Past Medical History:  Diagnosis Date   Actinic keratosis    Aortic atherosclerosis (HCC)    Arthritis    Coronary artery disease    COVID-19    DDD (degenerative disc disease), lumbar    with associated multilevel impingement   Diastolic heart failure (HCC)    Diverticulosis    GERD (gastroesophageal reflux disease)    Headache    History of hiatal hernia    History of kidney stones    HLD (hyperlipidemia)    Hypertension    IDA (iron deficiency anemia)    Lumbar spondylolysis    Myelolipoma of right adrenal gland    Paroxysmal atrial fibrillation (HCC)    Prostate cancer (HCC)    Seborrheic dermatitis    Shortness of breath dyspnea    Subendocardial myocardial infarction [facility]) 07/2009   PCI with DES  x 1 placed to LAD    Surgical History: Past Surgical History:  Procedure Laterality Date   CARDIAC CATHETERIZATION     2009, stent placed   CARDIOVERSION N/A 03/13/2015   Procedure: CARDIOVERSION;  Surgeon: Jerline Pain, MD;  Location: [place];  Service: Cardiovascular;  Laterality: N/A;   COLONOSCOPY WITH PROPOFOL N/A 07/19/2015   Procedure: COLONOSCOPY WITH PROPOFOL;  Surgeon: Manya Silvas, MD;  Location: [facility] ENDOSCOPY;  Service: Endoscopy;  Laterality: N/A;   COLONOSCOPY WITH PROPOFOL N/A 01/27/2019   Procedure: COLONOSCOPY WITH PROPOFOL;  Surgeon: Manya Silvas, MD;  Location: [place] ENDOSCOPY;  Service: Endoscopy;  Laterality: N/A;   cryo surgery of prostate     CYSTOSCOPY WITH BIOPSY N/A 11/19/2020   Procedure: CYSTOSCOPY WITH POSSIBLE BLADDER BIOPSY;  Surgeon: Abbie Sons, MD;  Location: [facility];  Service: Urology;  Laterality: N/A;   ESOPHAGOGASTRODUODENOSCOPY N/A 01/27/2019   Procedure: ESOPHAGOGASTRODUODENOSCOPY (EGD);  Surgeon: Manya Silvas, MD;  Location: [facility] ENDOSCOPY;  Service: Endoscopy;  Laterality: N/A;   ESOPHAGOGASTRODUODENOSCOPY (EGD) WITH PROPOFOL N/A 07/19/2015   Procedure: ESOPHAGOGASTRODUODENOSCOPY (EGD) WITH PROPOFOL;  Surgeon: Manya Silvas, MD;  Location: [facility] ENDOSCOPY;  Service: Endoscopy;  Laterality: N/A;   JOINT REPLACEMENT     both knees   LAPAROTOMY N/A 03/08/2015   Procedure: EXPLORATORY LAPAROTOMY;  Surgeon: Autumn Messing III, MD;  Location: [place];  Service: General;  Laterality: N/A;   LAPAROTOMY N/A 03/09/2015  Procedure: EXPLORATORY LAPAROTOMY AND EVACUATION OF HEMATOMA;  Surgeon: Autumn Messing III, MD;  Location: [place];  Service: General;  Laterality: N/A;   LYSIS OF ADHESION N/A 03/08/2015   Procedure: LYSIS OF ADHESION;  Surgeon: Autumn Messing III, MD;  Location: [place];  Service: General;  Laterality: N/A;   OMENTECTOMY N/A 03/08/2015   Procedure: OMENTECTOMY;  Surgeon: Autumn Messing III, MD;  Location: [place];  Service: General;  Laterality:  N/A;   TEE WITHOUT CARDIOVERSION N/A 03/13/2015   Procedure: TRANSESOPHAGEAL ECHOCARDIOGRAM (TEE);  Surgeon: Jerline Pain, MD;  Location: [place];  Service: Cardiovascular;  Laterality: N/A;   TOTAL KNEE ARTHROPLASTY Left 04/08/2016   Procedure: LEFT TOTAL KNEE ARTHROPLASTY;  Surgeon: Leandrew Koyanagi, MD;  Location: [place];  Service: Orthopedics;  Laterality: Left;   TOTAL KNEE ARTHROPLASTY Right 12/04/2019   Procedure: RIGHT TOTAL KNEE ARTHROPLASTY;  Surgeon: Leandrew Koyanagi, MD;  Location: [place];  Service: Orthopedics;  Laterality: Right;   UMBILICAL HERNIA REPAIR  03/08/2015   Procedure: UMBILICAL HERNIA REPAIR  ADULT;  Surgeon: Autumn Messing III, MD;  Location: [place];  Service: General;;   VENTRAL HERNIA REPAIR N/A 03/08/2015   Procedure:  VENTRAL HERNIA  REPAIR ADULT;  Surgeon: Autumn Messing III, MD;  Location: [place];  Service: General;  Laterality: N/A;    Home Medications:  Allergies as of 07/08/2022       Reactions   Statins Swelling, Other (See Comments)   Legs swelled up and cramped up really bad        Medication List        Accurate as of July 07, 2022  8:57 PM. If you have any questions, ask your nurse or doctor.          Acetaminophen 500 MG capsule Take by mouth.   ALLERGY RELIEF PO Take 1 Dose by mouth daily. Gets [employer] brand   amoxicillin 500 MG capsule Commonly known as: AMOXIL TAKE 4 CAPSULES BY MOUTH 1 HOUR PRIOR TOPROCEDURE.   amoxicillin 500 MG tablet Commonly known as: AMOXIL take 4 tabs po one hour prior to dental work.   ANTI-DIARRHEAL PO Take 1 Dose by mouth as needed. Gets [employer] brand   apixaban 5 MG Tabs tablet Commonly known as: ELIQUIS Take 5 mg by mouth 2 (two) times daily.   donepezil 10 MG tablet Commonly known as: ARICEPT Take 1 tablet (10 mg total) by mouth at bedtime.   ferrous sulfate 325 (65 FE) MG tablet Take 325 mg by mouth in the morning and at bedtime.   hydrALAZINE 25 MG tablet Commonly known as:  APRESOLINE Take 1 tablet by mouth 3 (three) times daily.   hydrochlorothiazide 25 MG tablet Commonly known as: HYDRODIURIL Take 25 mg by mouth daily.   hydrocortisone 2.5 % lotion Apply topically as directed. Apply to scaly areas on face and chest 3 nights per week Monday, Wednesday and fridays prn flares   ketoconazole 2 % cream Commonly known as: NIZORAL Apply 1 application topically 3 (three) times a week. Tues, Thurs, Sat at night   meclizine 12.5 MG tablet Commonly known as: ANTIVERT Take 12.5 mg by mouth daily.   memantine 10 MG tablet Commonly known as: NAMENDA Take 1 tablet (10 mg total) by mouth 2 (two) times daily.   metoprolol tartrate 100 MG tablet Commonly known as: LOPRESSOR Take 1 tablet (100 mg total) by mouth 2 (two) times daily.   omeprazole 20 MG capsule Commonly known as: PRILOSEC  Take 20 mg by mouth daily.   Red Yeast Rice Extract 600 MG Caps Take 600 mg by mouth 2 (two) times daily.   REFRESH OPTIVE PF OP Place 1 drop into both eyes 2 (two) times daily as needed (dry eyes).   tamsulosin 0.4 MG Caps capsule Commonly known as: FLOMAX TAKE 1 CAPSULE BY MOUTH TWICE DAILY   trospium 20 MG tablet Commonly known as: SANCTURA Take 1 tablet (20 mg total) by mouth 2 (two) times daily.   Vitamin D3 125 MCG (5000 UT) Tabs Take 5,000 Units by mouth daily.        Allergies:  Allergies  Allergen Reactions   Statins Swelling and Other (See Comments)    Legs swelled up and cramped up really bad    Family History: No family history on file.  Social History:  reports that he quit smoking about 33 years ago. His smoking use included cigarettes. He has never used smokeless tobacco. He reports that he does not drink alcohol and does not use drugs.  ROS: Pertinent ROS in HPI  Physical Exam: There were no vitals taken for this visit.  Constitutional:  Well nourished. Alert and oriented, No acute distress. HEENT: [place] AT, moist mucus membranes.  Trachea  midline Cardiovascular: No clubbing, cyanosis, or edema. Respiratory: Normal respiratory effort, no increased work of breathing. GU: No CVA tenderness.  No bladder fullness or masses.  Patient with circumcised/uncircumcised phallus. ***Foreskin easily retracted***  Urethral meatus is patent.  No penile discharge. No penile lesions or rashes. Scrotum without lesions, cysts, rashes and/or edema.  Testicles are located scrotally bilaterally. No masses are appreciated in the testicles. Left and right epididymis are normal. Rectal: Patient with  normal sphincter tone. Anus and perineum without scarring or rashes. No rectal masses are appreciated. Prostate is approximately *** grams, *** nodules are appreciated. Seminal vesicles are normal. Neurologic: Grossly intact, no focal deficits, moving all 4 extremities. Psychiatric: Normal mood and affect.   Laboratory Data: Serum creatinine August 2023 1.1  I have reviewed the labs.   Pertinent Imaging: ***   Assessment & Plan:    1. Prostate cancer -PSA undetectable  2. Urge incontinence -Neither he and his wife had any idea concerning PTNS, so the appointment was not to discuss PTNS but more of his bladder leakage -He is somewhat a difficult historian, but it appears that he mainly consumes Diet Coke and having some episodes of urge incontinence and incontinence without his awareness at night -According to epic he has filled both the trospium and tamsulosin recently, so I recommended that he discontinue the tamsulosin at this time and just stay on the trospium  No follow-ups on file.  These notes generated with voice recognition software. I apologize for typographical errors.  [place], [place] [address]  [place], [place] [address] [phone]

## 2022-07-08 ENCOUNTER — Ambulatory Visit: Payer: Medicare Other | Admitting: Urology

## 2022-07-08 ENCOUNTER — Encounter: Payer: Self-pay | Admitting: Urology

## 2022-07-08 VITALS — BP 122/74 | HR 69 | Ht 64.0 in | Wt 200.0 lb

## 2022-07-08 DIAGNOSIS — N3941 Urge incontinence: Secondary | ICD-10-CM

## 2022-07-08 DIAGNOSIS — C61 Malignant neoplasm of prostate: Secondary | ICD-10-CM | POA: Diagnosis not present

## 2022-07-08 LAB — BLADDER SCAN AMB NON-IMAGING

## 2022-07-09 LAB — URINALYSIS, COMPLETE
Bilirubin, UA: NEGATIVE
Glucose, UA: NEGATIVE
Ketones, UA: NEGATIVE
Leukocytes,UA: NEGATIVE
Nitrite, UA: NEGATIVE
Protein,UA: NEGATIVE
RBC, UA: NEGATIVE
Specific Gravity, UA: 1.025 (ref 1.005–1.030)
Urobilinogen, Ur: 1 mg/dL (ref 0.2–1.0)
pH, UA: 6 (ref 5.0–7.5)

## 2022-07-09 LAB — MICROSCOPIC EXAMINATION: Bacteria, UA: NONE SEEN

## 2022-08-10 ENCOUNTER — Ambulatory Visit: Payer: Medicare Other | Admitting: Urology

## 2022-08-19 ENCOUNTER — Ambulatory Visit: Payer: Medicare Other | Admitting: Dermatology

## 2022-08-31 ENCOUNTER — Ambulatory Visit: Payer: Medicare Other | Admitting: Urology

## 2022-09-07 ENCOUNTER — Ambulatory Visit: Payer: Medicare Other | Admitting: Urology

## 2022-09-18 DIAGNOSIS — H02889 Meibomian gland dysfunction of unspecified eye, unspecified eyelid: Secondary | ICD-10-CM | POA: Diagnosis not present

## 2022-09-18 DIAGNOSIS — M3501 Sicca syndrome with keratoconjunctivitis: Secondary | ICD-10-CM | POA: Diagnosis not present

## 2022-10-05 ENCOUNTER — Ambulatory Visit: Payer: Medicare Other | Admitting: Urology

## 2022-10-05 ENCOUNTER — Encounter: Payer: Self-pay | Admitting: Urology

## 2022-10-05 VITALS — BP 124/87 | HR 96 | Ht 64.0 in | Wt 203.0 lb

## 2022-10-05 DIAGNOSIS — N3941 Urge incontinence: Secondary | ICD-10-CM

## 2022-10-05 DIAGNOSIS — N3946 Mixed incontinence: Secondary | ICD-10-CM

## 2022-10-05 LAB — URINALYSIS, COMPLETE
Bilirubin, UA: NEGATIVE
Glucose, UA: NEGATIVE
Ketones, UA: NEGATIVE
Leukocytes,UA: NEGATIVE
Nitrite, UA: NEGATIVE
RBC, UA: NEGATIVE
Specific Gravity, UA: 1.025 (ref 1.005–1.030)
Urobilinogen, Ur: 1 mg/dL (ref 0.2–1.0)
pH, UA: 5 (ref 5.0–7.5)

## 2022-10-05 LAB — MICROSCOPIC EXAMINATION

## 2022-10-05 NOTE — Progress Notes (Signed)
10/05/2022 12:43 PM   Jim Dodson [birth year] [id number]  Referring provider: Shon Baton, MD [address] [place],  [place] [address]  No chief complaint on file.   HPI: ST: Cryoablation Dr. Jacqlyn Larsen in 2008 followed by salvage radiation in 2019.  Urge incontinence refractory to Flomax beta 3 agonist and antimuscarinics.  Had bladder biopsy in April 2022 inflammatory.  Failed Proscar Myrbetriq Gemtesa and still on Flomax.  Postvoid residual 0 mL given information about percutaneous tibial nerve stimulation.  Given trospium  Today Patient has urinary incontinence and can wear 2-3 pads a day sometimes damp and sometimes soaked.  The history was challenging trying to sort out his type of incontinence.  He does not report urge incontinence and I do not think he has bedwetting or foot on the floor syndrome.  I think he leaks without awareness more so with activity or certain movements.  He says he is not leak with coughing sneezing but leaks more with a full bladder  He voids every 2-3 hours and gets up once or twice a night.  Flow is good but he does not feel empty  He is left-handed with no hernia repair  No neurologic issues  Negative cough test in the standing position with normal male genitalia     PMH: Past Medical History:  Diagnosis Date   Actinic keratosis    Aortic atherosclerosis (HCC)    Arthritis    Coronary artery disease    COVID-19    DDD (degenerative disc disease), lumbar    with associated multilevel impingement   Diastolic heart failure (HCC)    Diverticulosis    GERD (gastroesophageal reflux disease)    Headache    History of hiatal hernia    History of kidney stones    HLD (hyperlipidemia)    Hypertension    IDA (iron deficiency anemia)    Lumbar spondylolysis    Myelolipoma of right adrenal gland    Paroxysmal atrial fibrillation (HCC)    Prostate cancer (HCC)    Seborrheic dermatitis    Shortness of breath dyspnea    Subendocardial  myocardial infarction [facility]) 07/2009   PCI with DES x 1 placed to LAD    Surgical History: Past Surgical History:  Procedure Laterality Date   CARDIAC CATHETERIZATION     2009, stent placed   CARDIOVERSION N/A 03/13/2015   Procedure: CARDIOVERSION;  Surgeon: Jerline Pain, MD;  Location: [place];  Service: Cardiovascular;  Laterality: N/A;   COLONOSCOPY WITH PROPOFOL N/A 07/19/2015   Procedure: COLONOSCOPY WITH PROPOFOL;  Surgeon: Manya Silvas, MD;  Location: [facility] ENDOSCOPY;  Service: Endoscopy;  Laterality: N/A;   COLONOSCOPY WITH PROPOFOL N/A 01/27/2019   Procedure: COLONOSCOPY WITH PROPOFOL;  Surgeon: Manya Silvas, MD;  Location: [facility] ENDOSCOPY;  Service: Endoscopy;  Laterality: N/A;   cryo surgery of prostate     CYSTOSCOPY WITH BIOPSY N/A 11/19/2020   Procedure: CYSTOSCOPY WITH POSSIBLE BLADDER BIOPSY;  Surgeon: Abbie Sons, MD;  Location: [facility] ORS;  Service: Urology;  Laterality: N/A;   ESOPHAGOGASTRODUODENOSCOPY N/A 01/27/2019   Procedure: ESOPHAGOGASTRODUODENOSCOPY (EGD);  Surgeon: Manya Silvas, MD;  Location: [facility] ENDOSCOPY;  Service: Endoscopy;  Laterality: N/A;   ESOPHAGOGASTRODUODENOSCOPY (EGD) WITH PROPOFOL N/A 07/19/2015   Procedure: ESOPHAGOGASTRODUODENOSCOPY (EGD) WITH PROPOFOL;  Surgeon: Manya Silvas, MD;  Location: [facility] ENDOSCOPY;  Service: Endoscopy;  Laterality: N/A;   JOINT REPLACEMENT     both knees   LAPAROTOMY N/A 03/08/2015   Procedure: EXPLORATORY LAPAROTOMY;  Surgeon: Autumn Messing III, MD;  Location: [place];  Service: General;  Laterality: N/A;   LAPAROTOMY N/A 03/09/2015   Procedure: EXPLORATORY LAPAROTOMY AND EVACUATION OF HEMATOMA;  Surgeon: Autumn Messing III, MD;  Location: [place];  Service: General;  Laterality: N/A;   LYSIS OF ADHESION N/A 03/08/2015   Procedure: LYSIS OF ADHESION;  Surgeon: Autumn Messing III, MD;  Location: [place];  Service: General;  Laterality: N/A;   OMENTECTOMY N/A 03/08/2015   Procedure: OMENTECTOMY;  Surgeon: Autumn Messing III, MD;   Location: [place];  Service: General;  Laterality: N/A;   TEE WITHOUT CARDIOVERSION N/A 03/13/2015   Procedure: TRANSESOPHAGEAL ECHOCARDIOGRAM (TEE);  Surgeon: Jerline Pain, MD;  Location: [place];  Service: Cardiovascular;  Laterality: N/A;   TOTAL KNEE ARTHROPLASTY Left 04/08/2016   Procedure: LEFT TOTAL KNEE ARTHROPLASTY;  Surgeon: Leandrew Koyanagi, MD;  Location: [place];  Service: Orthopedics;  Laterality: Left;   TOTAL KNEE ARTHROPLASTY Right 12/04/2019   Procedure: RIGHT TOTAL KNEE ARTHROPLASTY;  Surgeon: Leandrew Koyanagi, MD;  Location: [place];  Service: Orthopedics;  Laterality: Right;   UMBILICAL HERNIA REPAIR  03/08/2015   Procedure: UMBILICAL HERNIA REPAIR  ADULT;  Surgeon: Autumn Messing III, MD;  Location: [place];  Service: General;;   VENTRAL HERNIA REPAIR N/A 03/08/2015   Procedure:  VENTRAL HERNIA  REPAIR ADULT;  Surgeon: Autumn Messing III, MD;  Location: [place];  Service: General;  Laterality: N/A;    Home Medications:  Allergies as of 10/05/2022       Reactions   Statins Swelling, Other (See Comments)   Legs swelled up and cramped up really bad        Medication List        Accurate as of October 05, 2022 12:43 PM. If you have any questions, ask your nurse or doctor.          Acetaminophen 500 MG capsule Take by mouth.   ALLERGY RELIEF PO Take 1 Dose by mouth daily. Gets [employer] brand   amoxicillin 500 MG capsule Commonly known as: AMOXIL TAKE 4 CAPSULES BY MOUTH 1 HOUR PRIOR TOPROCEDURE.   amoxicillin 500 MG tablet Commonly known as: AMOXIL take 4 tabs po one hour prior to dental work.   ANTI-DIARRHEAL PO Take 1 Dose by mouth as needed. Gets [employer] brand   apixaban 5 MG Tabs tablet Commonly known as: ELIQUIS Take 5 mg by mouth 2 (two) times daily.   donepezil 10 MG tablet Commonly known as: ARICEPT Take 1 tablet (10 mg total) by mouth at bedtime.   ferrous sulfate 325 (65 FE) MG tablet Take 325 mg by mouth in the morning and at bedtime.   hydrALAZINE  25 MG tablet Commonly known as: APRESOLINE Take 1 tablet by mouth 3 (three) times daily.   hydrochlorothiazide 25 MG tablet Commonly known as: HYDRODIURIL Take 25 mg by mouth daily.   hydrocortisone 2.5 % lotion Apply topically as directed. Apply to scaly areas on face and chest 3 nights per week Monday, Wednesday and fridays prn flares   ketoconazole 2 % cream Commonly known as: NIZORAL Apply 1 application topically 3 (three) times a week. Tues, Thurs, Sat at night   meclizine 12.5 MG tablet Commonly known as: ANTIVERT Take 12.5 mg by mouth daily.   memantine 10 MG tablet Commonly known as: NAMENDA Take 1 tablet (10 mg total) by mouth 2 (two) times daily.   metoprolol tartrate 100 MG tablet Commonly known as: LOPRESSOR Take  1 tablet (100 mg total) by mouth 2 (two) times daily.   omeprazole 20 MG capsule Commonly known as: PRILOSEC Take 20 mg by mouth daily.   Red Yeast Rice Extract 600 MG Caps Take 600 mg by mouth 2 (two) times daily.   REFRESH OPTIVE PF OP Place 1 drop into both eyes 2 (two) times daily as needed (dry eyes).   tamsulosin 0.4 MG Caps capsule Commonly known as: FLOMAX TAKE 1 CAPSULE BY MOUTH TWICE DAILY   trospium 20 MG tablet Commonly known as: SANCTURA Take 1 tablet (20 mg total) by mouth 2 (two) times daily.   Vitamin D3 125 MCG (5000 UT) Tabs Generic drug: Cholecalciferol Take 5,000 Units by mouth daily.        Allergies:  Allergies  Allergen Reactions   Statins Swelling and Other (See Comments)    Legs swelled up and cramped up really bad    Family History: No family history on file.  Social History:  reports that he quit smoking about 34 years ago. His smoking use included cigarettes. He has never used smokeless tobacco. He reports that he does not drink alcohol and does not use drugs.  ROS:                                        Physical Exam: There were no vitals taken for this visit.   Constitutional:  Alert and oriented, No acute distress. HEENT: [place] AT, moist mucus membranes.  Trachea midline, no masses.   Laboratory Data: Lab Results  Component Value Date   WBC 8.9 12/05/2019   HGB 10.6 (L) 12/05/2019   HCT 32.7 (L) 12/05/2019   MCV 92.9 12/05/2019   PLT 131 (L) 12/05/2019    Lab Results  Component Value Date   CREATININE 1.25 (H) 11/15/2020    No results found for: "PSA"  No results found for: "TESTOSTERONE"  No results found for: "HGBA1C"  Urinalysis    Component Value Date/Time   COLORURINE YELLOW 11/30/2019 1030   APPEARANCEUR Clear 07/08/2022 1131   LABSPEC 1.013 11/30/2019 1030   PHURINE 7.0 11/30/2019 1030   GLUCOSEU Negative 07/08/2022 [place] 11/30/2019 1030   BILIRUBINUR Negative 07/08/2022 [place] 11/30/2019 1030   PROTEINUR Negative 07/08/2022 [place] 11/30/2019 1030   UROBILINOGEN 1.0 03/08/2015 1936   NITRITE Negative 07/08/2022 1131   NITRITE NEGATIVE 11/30/2019 1030   LEUKOCYTESUR Negative 07/08/2022 [place] 11/30/2019 1030    Pertinent Imaging:   Assessment & Plan: Patient has urinary incontinence and leakage without awareness.  He does not have overflow incontinence.  He may or may not have stress incontinence but likely has a component of overactive bladder that is not feeling.  The role of urodynamics and cystoscopy discussed as he has failed a number of medications.  Patient would like to go ahead with urodynamics and cystoscopy and we will proceed accordingly  There are no diagnoses linked to this encounter.  No follow-ups on file.  Reece Packer, MD  [facility] [address] [place], [place] [address] [phone]

## 2022-10-05 NOTE — Patient Instructions (Signed)

## 2022-10-07 LAB — CULTURE, URINE COMPREHENSIVE

## 2022-11-06 DIAGNOSIS — I5032 Chronic diastolic (congestive) heart failure: Secondary | ICD-10-CM | POA: Diagnosis not present

## 2022-11-06 DIAGNOSIS — I429 Cardiomyopathy, unspecified: Secondary | ICD-10-CM | POA: Diagnosis not present

## 2022-11-06 DIAGNOSIS — I480 Paroxysmal atrial fibrillation: Secondary | ICD-10-CM | POA: Diagnosis not present

## 2022-11-06 DIAGNOSIS — I5022 Chronic systolic (congestive) heart failure: Secondary | ICD-10-CM | POA: Diagnosis not present

## 2022-11-20 ENCOUNTER — Other Ambulatory Visit: Payer: Self-pay | Admitting: Urology

## 2022-11-27 DIAGNOSIS — N3941 Urge incontinence: Secondary | ICD-10-CM | POA: Diagnosis not present

## 2022-11-30 ENCOUNTER — Encounter: Payer: Self-pay | Admitting: Urology

## 2022-11-30 ENCOUNTER — Ambulatory Visit: Payer: Medicare Other | Admitting: Urology

## 2022-11-30 VITALS — BP 123/88 | HR 112 | Ht 64.0 in | Wt 203.0 lb

## 2022-11-30 DIAGNOSIS — N3942 Incontinence without sensory awareness: Secondary | ICD-10-CM | POA: Diagnosis not present

## 2022-11-30 DIAGNOSIS — N3281 Overactive bladder: Secondary | ICD-10-CM | POA: Diagnosis not present

## 2022-11-30 DIAGNOSIS — N3941 Urge incontinence: Secondary | ICD-10-CM

## 2022-11-30 LAB — MICROSCOPIC EXAMINATION

## 2022-11-30 LAB — URINALYSIS, COMPLETE
Bilirubin, UA: NEGATIVE
Glucose, UA: NEGATIVE
Ketones, UA: NEGATIVE
Leukocytes,UA: NEGATIVE
Nitrite, UA: NEGATIVE
Protein,UA: NEGATIVE
Specific Gravity, UA: 1.025 (ref 1.005–1.030)
Urobilinogen, Ur: 0.2 mg/dL (ref 0.2–1.0)
pH, UA: 5.5 (ref 5.0–7.5)

## 2022-11-30 NOTE — Patient Instructions (Signed)

## 2022-11-30 NOTE — Progress Notes (Signed)
11/30/2022 12:59 PM   Jim Dodson 08-15-1940 [id number]  Referring provider: Creola Corn, MD [address] [place],  [place] [address]  No chief complaint on file.   HPI: ST: Cryoablation Dr. Achilles Dunk in 2008 followed by salvage radiation in 2019.  Urge incontinence refractory to Flomax beta 3 agonist and antimuscarinics.  Had bladder biopsy in April 2022 inflammatory.  Failed Proscar Myrbetriq Gemtesa and still on Flomax.  Postvoid residual 0 mL given information about percutaneous tibial nerve stimulation.  Given trospium   Today Patient has urinary incontinence and can wear 2-3 pads a day sometimes damp and sometimes soaked.  The history was challenging trying to sort out his type of incontinence.  He does not report urge incontinence and I do not think he has bedwetting or foot on the floor syndrome.  I think he leaks without awareness more so with activity or certain movements.  He says he is not leak with coughing sneezing but leaks more with a full bladder   He voids every 2-3 hours and gets up once or twice a night.  Flow is good but he does not feel empty   He is left-handed with no hernia repair   No neurologic issues   Negative cough test in the standing position with normal male genitalia    Patient has urinary incontinence and leakage without awareness.  He does not have overflow incontinence.  He may or may not have stress incontinence but likely has a component of overactive bladder that is not feeling.  The role of urodynamics and cystoscopy discussed as he has failed a number of medications.  Patient would like to go ahead with urodynamics and cystoscopy and we will proceed accordingly   Today Frequency stable.  Incontinence stable. During urodynamics patient did not void and was catheterized for 100 mL.  He had increased bladder sensation.  Maximum bladder capacity was 100 mL.  His bladder was unstable.  He had recurrent bladder overactivity reaching pressure 60 cm  of water.  He leaks a moderate to severe amount.  The overactivity was impressive.  He had no stress incontinence with a Valsalva pressure 76 cm of water.  During voiding he voided 96 mL with a maximal flow of 5 mL/s.  Max voiding pressure was 34 cm of water.  EMG activity decreased during voiding.  He had minimal bladder trabeculation.  Patient said his incontinence started after his bladder biopsy I believe November 19, 2020.  He had an erythematous lesion at the left bladder base next to the trigone.  He had a wide caliber bulbar stricture that allowed the scope to pass and was not dilated.  Importantly he was documented as having urinary incontinence prior to this in his clinical notes.  He had already tried medication such as Gemtesa.  He could not remember having salvage radiation in December 2019 with worsening voiding symptoms posttreatment  Cystoscopy: Patient underwent flexible cystoscopy.  Penile bulbar urethra normal.  I could see the verumontanum.  He had a short fibrotic static urethra.  He had pale bladder mucosa.  Trigone normal.  No carcinoma.  No erythema.  No obvious radiation changes  In the supine position after cystoscopy patient had a negative cough test with a moderate cough    PMH: Past Medical History:  Diagnosis Date   Actinic keratosis    Aortic atherosclerosis (HCC)    Arthritis    Coronary artery disease    COVID-19    DDD (degenerative disc disease),  lumbar    with associated multilevel impingement   Diastolic heart failure (HCC)    Diverticulosis    GERD (gastroesophageal reflux disease)    Headache    History of hiatal hernia    History of kidney stones    HLD (hyperlipidemia)    Hypertension    IDA (iron deficiency anemia)    Lumbar spondylolysis    Myelolipoma of right adrenal gland    Paroxysmal atrial fibrillation (HCC)    Prostate cancer (HCC)    Seborrheic dermatitis    Shortness of breath dyspnea    Subendocardial myocardial infarction [facility])  07/2009   PCI with DES x 1 placed to LAD    Surgical History: Past Surgical History:  Procedure Laterality Date   CARDIAC CATHETERIZATION     2009, stent placed   CARDIOVERSION N/A 03/13/2015   Procedure: CARDIOVERSION;  Surgeon: Jake Bathe, MD;  Location: [facility] ENDOSCOPY;  Service: Cardiovascular;  Laterality: N/A;   COLONOSCOPY WITH PROPOFOL N/A 07/19/2015   Procedure: COLONOSCOPY WITH PROPOFOL;  Surgeon: Scot Jun, MD;  Location[facility] ENDOSCOPY;  Service: Endoscopy;  Laterality: N/A;   COLONOSCOPY WITH PROPOFOL N/A 01/27/2019   Procedure: COLONOSCOPY WITH PROPOFOL;  Surgeon: Scot Jun, MD;  Location: [facility] ENDOSCOPY;  Service: Endoscopy;  Laterality: N/A;   cryo surgery of prostate     CYSTOSCOPY WITH BIOPSY N/A 11/19/2020   Procedure: CYSTOSCOPY WITH POSSIBLE BLADDER BIOPSY;  Surgeon: Riki Altes, MD;  Location: [facility] ORS;  Service: Urology;  Laterality: N/A;   ESOPHAGOGASTRODUODENOSCOPY N/A 01/27/2019   Procedure: ESOPHAGOGASTRODUODENOSCOPY (EGD);  Surgeon: Scot Jun, MD;  Location: [facility] ENDOSCOPY;  Service: Endoscopy;  Laterality: N/A;   ESOPHAGOGASTRODUODENOSCOPY (EGD) WITH PROPOFOL N/A 07/19/2015   Procedure: ESOPHAGOGASTRODUODENOSCOPY (EGD) WITH PROPOFOL;  Surgeon: Scot Jun, MD;  Location: [facility] ENDOSCOPY;  Service: Endoscopy;  Laterality: N/A;   JOINT REPLACEMENT     both knees   LAPAROTOMY N/A 03/08/2015   Procedure: EXPLORATORY LAPAROTOMY;  Surgeon: Chevis Pretty III, MD;  [facility];  Service: General;  Laterality: N/A;   LAPAROTOMY N/A 03/09/2015   Procedure: EXPLORATORY LAPAROTOMY AND EVACUATION OF HEMATOMA;  Surgeon: Chevis Pretty III, MD;  Location: [facility] OR;  Service: General;  Laterality: N/A;   LYSIS OF ADHESION N/A 03/08/2015   Procedure: LYSIS OF ADHESION;  Surgeon: Chevis Pretty III, MD;  [facility];  Service: General;  Laterality: N/A;   OMENTECTOMY N/A 03/08/2015   Procedure: OMENTECTOMY;  Surgeon: Chevis Pretty III, MD;  [facility];  Service:  General;  Laterality: N/A;   TEE WITHOUT CARDIOVERSION N/A 03/13/2015   Procedure: TRANSESOPHAGEAL ECHOCARDIOGRAM (TEE);  Surgeon: Jake Bathe, MD;  Location: [facility] ENDOSCOPY;  Service: Cardiovascular;  Laterality: N/A;   TOTAL KNEE ARTHROPLASTY Left 04/08/2016   Procedure: LEFT TOTAL KNEE ARTHROPLASTY;  Surgeon: Tarry Kos, MD;  [facility];  Service: Orthopedics;  Laterality: Left;   TOTAL KNEE ARTHROPLASTY Right 12/04/2019   Procedure: RIGHT TOTAL KNEE ARTHROPLASTY;  Surgeon: Tarry Kos, MD;  [facility];  Service: Orthopedics;  Laterality: Right;   UMBILICAL HERNIA REPAIR  03/08/2015   Procedure: UMBILICAL HERNIA REPAIR  ADULT;  Surgeon: Chevis Pretty III, MD;  [facility];  Service: General;;   VENTRAL HERNIA REPAIR N/A 03/08/2015   Procedure:  VENTRAL HERNIA  REPAIR ADULT;  Surgeon: Chevis Pretty III, MD;  [facility];  Service: General;  Laterality: N/A;    Home Medications:  Allergies as of 11/30/2022  Reactions   Statins Swelling, Other (See Comments)   Legs swelled up and cramped up really bad        Medication List        Accurate as of November 30, 2022 12:59 PM. If you have any questions, ask your nurse or doctor.          Acetaminophen 500 MG capsule Take by mouth.   ALLERGY RELIEF PO Take 1 Dose by mouth daily. Gets [employer] brand   amoxicillin 500 MG tablet Commonly known as: AMOXIL take 4 tabs po one hour prior to dental work.   ANTI-DIARRHEAL PO Take 1 Dose by mouth as needed. Gets [employer] brand   apixaban 5 MG Tabs tablet Commonly known as: ELIQUIS Take 5 mg by mouth 2 (two) times daily.   donepezil 10 MG tablet Commonly known as: ARICEPT Take 1 tablet (10 mg total) by mouth at bedtime.   ferrous sulfate 325 (65 FE) MG tablet Take 325 mg by mouth in the morning and at bedtime.   hydrALAZINE 25 MG tablet Commonly known as: APRESOLINE Take 1 tablet by mouth 3 (three) times daily.   hydrochlorothiazide 25 MG  tablet Commonly known as: HYDRODIURIL Take 25 mg by mouth daily.   hydrocortisone 2.5 % lotion Apply topically as directed. Apply to scaly areas on face and chest 3 nights per week Monday, Wednesday and fridays prn flares   ketoconazole 2 % cream Commonly known as: NIZORAL Apply 1 application topically 3 (three) times a week. Tues, Thurs, Sat at night   meclizine 12.5 MG tablet Commonly known as: ANTIVERT Take 12.5 mg by mouth daily.   memantine 10 MG tablet Commonly known as: NAMENDA Take 1 tablet (10 mg total) by mouth 2 (two) times daily.   metoprolol tartrate 100 MG tablet Commonly known as: LOPRESSOR Take 1 tablet (100 mg total) by mouth 2 (two) times daily.   omeprazole 20 MG capsule Commonly known as: PRILOSEC Take 20 mg by mouth daily.   Red Yeast Rice Extract 600 MG Caps Take 600 mg by mouth 2 (two) times daily.   REFRESH OPTIVE PF OP Place 1 drop into both eyes 2 (two) times daily as needed (dry eyes).   tamsulosin 0.4 MG Caps capsule Commonly known as: FLOMAX TAKE 1 CAPSULE BY MOUTH TWICE DAILY   trospium 20 MG tablet Commonly known as: SANCTURA TAKE ONE (1) TABLET BY MOUTH TWO TIMES PER DAY   Vitamin D3 125 MCG (5000 UT) Tabs Take 5,000 Units by mouth daily.        Allergies:  Allergies  Allergen Reactions   Statins Swelling and Other (See Comments)    Legs swelled up and cramped up really bad    Family History: No family history on file.  Social History:  reports that he quit smoking about 34 years ago. His smoking use included cigarettes. He has been exposed to tobacco smoke. He has never used smokeless tobacco. He reports that he does not drink alcohol and does not use drugs.  ROS:                                        Physical Exam: There were no vitals taken for this visit.  Constitutional:  Alert and oriented, No acute distress. HEENT: [place] AT, moist mucus membranes.  Trachea midline, no masses.   Laboratory  Data: Lab Results  Component  Value Date   WBC 8.9 12/05/2019   HGB 10.6 (L) 12/05/2019   HCT 32.7 (L) 12/05/2019   MCV 92.9 12/05/2019   PLT 131 (L) 12/05/2019    Lab Results  Component Value Date   CREATININE 1.25 (H) 11/15/2020    No results found for: "PSA"  No results found for: "TESTOSTERONE"  No results found for: "HGBA1C"  Urinalysis    Component Value Date/Time   COLORURINE YELLOW 11/30/2019 1030   APPEARANCEUR Clear 10/05/2022 1301   LABSPEC 1.013 11/30/2019 1030   PHURINE 7.0 11/30/2019 1030   GLUCOSEU Negative 10/05/2022 1301   HGBUR NEGATIVE 11/30/2019 1030   BILIRUBINUR Negative 10/05/2022 1301   KETONESUR NEGATIVE 11/30/2019 1030   PROTEINUR 1+ (A) 10/05/2022 1301   PROTEINUR NEGATIVE 11/30/2019 1030   UROBILINOGEN 1.0 03/08/2015 1936   NITRITE Negative 10/05/2022 1301   NITRITE NEGATIVE 11/30/2019 1030   LEUKOCYTESUR Negative 10/05/2022 1301   LEUKOCYTESUR NEGATIVE 11/30/2019 1030    Pertinent Imaging:   Assessment & Plan: Patient clearly has a severe overactive bladder with leakage without awareness.  He may have reduced bladder capacity under anesthesia but I will not perform this evaluation.  I discussed 3 refractory OAB treatments with him but I lowered the success rate in all 3.  I think Botox may not be ideal and his retention rate may be higher.  May choose watchful waiting and live with the condition.  He does have complicated voiding dysfunction.  I will have him see Dr. Lonna Cobb in 6 months for long-term surveillance still on trospium.  They will call if they want to proceed.  Handouts given.  There are no diagnoses linked to this encounter.  No follow-ups on file.  Martina Sinner, MD  [facility] [address] [place], [place] [address] [phone]

## 2022-12-03 LAB — CULTURE, URINE COMPREHENSIVE

## 2022-12-20 DIAGNOSIS — D6869 Other thrombophilia: Secondary | ICD-10-CM | POA: Diagnosis not present

## 2023-03-24 ENCOUNTER — Other Ambulatory Visit: Payer: Self-pay | Admitting: Neurology

## 2023-04-29 ENCOUNTER — Ambulatory Visit: Payer: Medicare Other | Admitting: Neurology

## 2023-04-29 ENCOUNTER — Encounter: Payer: Self-pay | Admitting: Neurology

## 2023-05-31 ENCOUNTER — Encounter: Payer: Self-pay | Admitting: Urology

## 2023-05-31 ENCOUNTER — Ambulatory Visit: Payer: Medicare Other | Admitting: Urology

## 2023-05-31 VITALS — BP 142/93 | HR 142 | Ht 62.0 in | Wt 200.0 lb

## 2023-05-31 DIAGNOSIS — N3281 Overactive bladder: Secondary | ICD-10-CM | POA: Diagnosis not present

## 2023-05-31 DIAGNOSIS — N3941 Urge incontinence: Secondary | ICD-10-CM

## 2023-05-31 LAB — BLADDER SCAN AMB NON-IMAGING: Scan Result: 0

## 2023-05-31 NOTE — Progress Notes (Signed)
I, Jim Dodson, acting as a scribe for Riki Altes, MD., have documented all relevant documentation on the behalf of Riki Altes, MD, as directed by Riki Altes, MD while in the presence of Riki Altes, MD.  05/31/2023 12:23 PM   Jim Dodson 24-Mar-1941 [id number]  Referring provider: Creola Corn, MD [address] [place],  [place] [address]  Chief Complaint  Patient presents with   Urinary Frequency   Urologic history: Prostate Cancer History of intermediate risk prostate cancer status post cryoablation by Dr. Achilles Dunk 2008 and salvage radiation Salvage radiation completed December 2019 with worsening voiding symptoms posttreatment   2. Urge incontinence Post radiation with storage elated voiding symptoms including frequency, urgency, urge incontinence and nocturia x3 Refractory to tamsulosin, beta-3 and anticholinergic meds   3. Bladder erythema Underwent bladder biopsy 11/2020 for area of erythema which was inflammatory and no evidence of cancer/CIS  HPI: Jim Dodson is a 82 y.o. male presents for 6 month follow up visit.  History of urinary incontinence refractory to Myrbetriq, Gemtesa, and fesoterodine. He had seen Michiel Cowboy at his last few visits, and because of a persistent incontinence, was seen by Dr. Sherron Monday for further evaluation and subsequently underwent a urodynamic study which showed hypersensitivity and an unstable bladder. Cystoscopy showed no evidence of urethral stricture and a short prostatic urethra.  Dr. Sherron Monday discussed second line options for refractory urge incontinence, including Botox, neuromodulation and observation. He elected the latter.  His voiding pattern is stable and he remains on trospium 20 mg twice daily.  It does not look like his tamsulosin has been refilled since a 90-day supply dispensed on 11/04/21.   PMH: Past Medical History:  Diagnosis Date   Actinic keratosis    Aortic atherosclerosis (HCC)     Arthritis    Coronary artery disease    COVID-19    DDD (degenerative disc disease), lumbar    with associated multilevel impingement   Diastolic heart failure (HCC)    Diverticulosis    GERD (gastroesophageal reflux disease)    Headache    History of hiatal hernia    History of kidney stones    HLD (hyperlipidemia)    Hypertension    IDA (iron deficiency anemia)    Lumbar spondylolysis    Myelolipoma of right adrenal gland    Paroxysmal atrial fibrillation (HCC)    Prostate cancer (HCC)    Seborrheic dermatitis    Shortness of breath dyspnea    Subendocardial myocardial infarction [facility]) 07/2009   PCI with DES x 1 placed to LAD    Surgical History: Past Surgical History:  Procedure Laterality Date   CARDIAC CATHETERIZATION     2009, stent placed   CARDIOVERSION N/A 03/13/2015   Procedure: CARDIOVERSION;  Surgeon: Jake Bathe, MD;  Location[facility] ENDOSCOPY;  Service: Cardiovascular;  Laterality: N/A;   COLONOSCOPY WITH PROPOFOL N/A 07/19/2015   Procedure: COLONOSCOPY WITH PROPOFOL;  Surgeon: Scot Jun, MD;  Location: [facility] ENDOSCOPY;  Service: Endoscopy;  Laterality: N/A;   COLONOSCOPY WITH PROPOFOL N/A 01/27/2019   Procedure: COLONOSCOPY WITH PROPOFOL;  Surgeon: Scot Jun, MD;  Location: [facility] ENDOSCOPY;  Service: Endoscopy;  Laterality: N/A;   cryo surgery of prostate     CYSTOSCOPY WITH BIOPSY N/A 11/19/2020   Procedure: CYSTOSCOPY WITH POSSIBLE BLADDER BIOPSY;  Surgeon: Riki Altes, MD;  Location: [facility] ORS;  Service: Urology;  Laterality: N/A;   ESOPHAGOGASTRODUODENOSCOPY N/A 01/27/2019   Procedure: ESOPHAGOGASTRODUODENOSCOPY (EGD);  Surgeon: Scot Jun, MD;  Location: [facility] ENDOSCOPY;  Service: Endoscopy;  Laterality: N/A;   ESOPHAGOGASTRODUODENOSCOPY (EGD) WITH PROPOFOL N/A 07/19/2015   Procedure: ESOPHAGOGASTRODUODENOSCOPY (EGD) WITH PROPOFOL;  Surgeon: Scot Jun, MD;  Location: [facility] ENDOSCOPY;  Service: Endoscopy;  Laterality: N/A;    JOINT REPLACEMENT     both knees   LAPAROTOMY N/A 03/08/2015   Procedure: EXPLORATORY LAPAROTOMY;  Surgeon: Chevis Pretty III, MD;  Location: [facility] OR;  Service: General;  Laterality: N/A;   LAPAROTOMY N/A 03/09/2015   Procedure: EXPLORATORY LAPAROTOMY AND EVACUATION OF HEMATOMA;  Surgeon: Chevis Pretty III, MD;  Location: [facility] OR;  Service: General;  Laterality: N/A;   LYSIS OF ADHESION N/A 03/08/2015   Procedure: LYSIS OF ADHESION;  Surgeon: Chevis Pretty III, MD;  Location: [facility] OR;  Service: General;  Laterality: N/A;   OMENTECTOMY N/A 03/08/2015   Procedure: OMENTECTOMY;  Surgeon: Chevis Pretty III, MD;  Location: [facility] OR;  Service: General;  Laterality: N/A;   TEE WITHOUT CARDIOVERSION N/A 03/13/2015   Procedure: TRANSESOPHAGEAL ECHOCARDIOGRAM (TEE);  Surgeon: Jake Bathe, MD;  Location: [facility] ENDOSCOPY;  Service: Cardiovascular;  Laterality: N/A;   TOTAL KNEE ARTHROPLASTY Left 04/08/2016   Procedure: LEFT TOTAL KNEE ARTHROPLASTY;  Surgeon: Tarry Kos, MD;  Location: [facility] OR;  Service: Orthopedics;  Laterality: Left;   TOTAL KNEE ARTHROPLASTY Right 12/04/2019   Procedure: RIGHT TOTAL KNEE ARTHROPLASTY;  Surgeon: Tarry Kos, MD;  [facility]: [facility] OR;  Service: Orthopedics;  Laterality: Right;   UMBILICAL HERNIA REPAIR  03/08/2015   Procedure: UMBILICAL HERNIA REPAIR  ADULT;  Surgeon: Chevis Pretty III, MD;  Location: [facility] OR;  Service: General;;   VENTRAL HERNIA REPAIR N/A 03/08/2015   Procedure:  VENTRAL HERNIA  REPAIR ADULT;  Surgeon: Chevis Pretty III, MD;  Location: [facility] OR;  Service: General;  Laterality: N/A;    Home Medications:  Allergies as of 05/31/2023       Reactions   Statins Swelling, Other (See Comments)   Legs swelled up and cramped up really bad        Medication List        Accurate as of May 31, 2023 12:23 PM. If you have any questions, ask your nurse or doctor.          Acetaminophen 500 MG capsule Take by mouth.   ALLERGY RELIEF PO Take 1 Dose by mouth daily. Gets [employer] brand    amoxicillin 500 MG tablet Commonly known as: AMOXIL take 4 tabs po one hour prior to dental work.   apixaban 5 MG Tabs tablet Commonly known as: ELIQUIS Take 5 mg by mouth 2 (two) times daily.   donepezil 10 MG tablet Commonly known as: ARICEPT TAKE 1 TABLET BY MOUTH NIGHTLY   ferrous sulfate 325 (65 FE) MG tablet Take 325 mg by mouth in the morning and at bedtime.   hydrochlorothiazide 25 MG tablet Commonly known as: HYDRODIURIL Take 25 mg by mouth daily.   meclizine 12.5 MG tablet Commonly known as: ANTIVERT Take 12.5 mg by mouth daily.   memantine 10 MG tablet Commonly known as: NAMENDA Take 1 tablet (10 mg total) by mouth 2 (two) times daily.   metoprolol tartrate 100 MG tablet Commonly known as: LOPRESSOR Take 1 tablet (100 mg total) by mouth 2 (two) times daily.   omeprazole 20 MG capsule Commonly known as: PRILOSEC Take 20 mg by mouth daily.   Red Yeast Rice Extract 600 MG Caps Take 600 mg by mouth 2 (  two) times daily.   REFRESH OPTIVE PF OP Place 1 drop into both eyes 2 (two) times daily as needed (dry eyes).   trospium 20 MG tablet Commonly known as: SANCTURA TAKE ONE (1) TABLET BY MOUTH TWO TIMES PER DAY   Vitamin D3 125 MCG (5000 UT) Tabs Take 5,000 Units by mouth daily.        Allergies:  Allergies  Allergen Reactions   Statins Swelling and Other (See Comments)    Legs swelled up and cramped up really bad     Social History:  reports that he quit smoking about 34 years ago. His smoking use included cigarettes. He started smoking about 54 years ago. He has been exposed to tobacco smoke. He has never used smokeless tobacco. He reports that he does not drink alcohol and does not use drugs.   Physical Exam: BP (!) 142/93   Pulse (!) 142   Ht 5\' 2"  (1.575 m)   Wt 200 lb (90.7 kg)   BMI 36.58 kg/m   Constitutional:  Alert, No acute distress. HEENT: [place] AT Respiratory: Normal respiratory effort, no increased work of  breathing. Psychiatric: Normal mood and affect.   Assessment & Plan:    1. Overactive bladder Severe OAB with incontinence. He does not desire additional management at this time and will continue the trospium.  PVR today was 0 mL.  PA follow-up 1 year.  Instructed to call earlier for worsening voiding symptoms.   I have reviewed the above documentation for accuracy and completeness, and I agree with the above.   Riki Altes, MD  [facility] [address] [place], [place] [address] [phone]

## 2023-06-25 ENCOUNTER — Other Ambulatory Visit: Payer: Self-pay | Admitting: Neurology

## 2023-06-28 NOTE — Telephone Encounter (Signed)
Last seen on 04/23/22 No follow up scheduled   Recent Rx's have been filled by Creola Corn, MD

## 2023-10-27 ENCOUNTER — Telehealth: Payer: Self-pay | Admitting: Radiation Oncology

## 2023-10-27 ENCOUNTER — Other Ambulatory Visit: Payer: Self-pay | Admitting: Urology

## 2023-10-27 NOTE — Telephone Encounter (Signed)
 Returned call to daughter to advise her that at his December visit to see Dr. Rushie Chestnut  he was released to be followed by urology.

## 2023-10-27 NOTE — Telephone Encounter (Signed)
 Patients step daughter Albin Felling called to see if he needed a follow up appointment. Albin Felling request a call back at [phone]

## 2023-11-04 DIAGNOSIS — Z5181 Encounter for therapeutic drug level monitoring: Secondary | ICD-10-CM | POA: Diagnosis not present

## 2023-11-04 DIAGNOSIS — I480 Paroxysmal atrial fibrillation: Secondary | ICD-10-CM | POA: Diagnosis not present

## 2023-11-04 DIAGNOSIS — I10 Essential (primary) hypertension: Secondary | ICD-10-CM | POA: Diagnosis not present

## 2023-11-04 DIAGNOSIS — I255 Ischemic cardiomyopathy: Secondary | ICD-10-CM | POA: Diagnosis not present

## 2023-11-04 DIAGNOSIS — I2510 Atherosclerotic heart disease of native coronary artery without angina pectoris: Secondary | ICD-10-CM | POA: Diagnosis not present

## 2023-11-04 DIAGNOSIS — Z7902 Long term (current) use of antithrombotics/antiplatelets: Secondary | ICD-10-CM | POA: Diagnosis not present

## 2023-11-11 DIAGNOSIS — I255 Ischemic cardiomyopathy: Secondary | ICD-10-CM | POA: Diagnosis not present

## 2023-11-11 DIAGNOSIS — I480 Paroxysmal atrial fibrillation: Secondary | ICD-10-CM | POA: Diagnosis not present

## 2023-11-11 DIAGNOSIS — I77819 Aortic ectasia, unspecified site: Secondary | ICD-10-CM | POA: Diagnosis not present

## 2023-11-18 ENCOUNTER — Ambulatory Visit: Admitting: Physician Assistant

## 2023-12-02 ENCOUNTER — Encounter: Payer: Self-pay | Admitting: Physician Assistant

## 2023-12-02 ENCOUNTER — Ambulatory Visit: Admitting: Physician Assistant

## 2023-12-02 VITALS — Ht 64.0 in | Wt 200.0 lb

## 2023-12-02 DIAGNOSIS — N3941 Urge incontinence: Secondary | ICD-10-CM | POA: Diagnosis not present

## 2023-12-02 NOTE — Progress Notes (Signed)
 12/02/2023 9:47 AM   Jim Dodson March 10, 1941 [id number]  CC: Chief Complaint  Patient presents with   Urinary Incontinence   HPI: Jim Dodson is a 83 y.o. male with PMH intermediate risk prostate cancer s/p cryoablation in 2008 and salvage radiation in 2019, urge incontinence refractory to tamsulosin , beta 3 agonist, and anticholinergics, and bladder erythema seen on bladder biopsy in 2022 who presents today for evaluation of urinary incontinence.  Is accompanied today by his stepdaughter, who contributes to HPI.  He previously saw Dr. Clarke Crouch and underwent urodynamics, which showed hypersensitivity and instability.  He was offered third line therapies but declined these.  Today he reports ongoing bothersome urinary leakage, which may be worsening.  He denies dysuria, flank pain, or gross hematuria.  He wears 2 pads and a Depends daily.  He is primarily bothered by nocturia x 5-6, which he feels is new over the past year or so.  He has a history of snoring, but has never had a sleep study.  He does have a history of CHF and does not wear compression socks.  He is unable to provide a urine specimen today.  PMH: Past Medical History:  Diagnosis Date   Actinic keratosis    Aortic atherosclerosis (HCC)    Arthritis    Coronary artery disease    COVID-19    DDD (degenerative disc disease), lumbar    with associated multilevel impingement   Diastolic heart failure (HCC)    Diverticulosis    GERD (gastroesophageal reflux disease)    Headache    History of hiatal hernia    History of kidney stones    HLD (hyperlipidemia)    Hypertension    IDA (iron deficiency anemia)    Lumbar spondylolysis    Myelolipoma of right adrenal gland    Paroxysmal atrial fibrillation (HCC)    Prostate cancer (HCC)    Seborrheic dermatitis    Shortness of breath dyspnea    Subendocardial myocardial infarction [facility]) 07/2009   PCI with DES x 1 placed to LAD    Surgical History: Past  Surgical History:  Procedure Laterality Date   CARDIAC CATHETERIZATION     2009, stent placed   CARDIOVERSION N/A 03/13/2015   Procedure: CARDIOVERSION;  Surgeon: Hugh Madura, MD;  Location: [facility] ENDOSCOPY;  Service: Cardiovascular;  Laterality: N/A;   COLONOSCOPY WITH PROPOFOL  N/A 07/19/2015   Procedure: COLONOSCOPY WITH PROPOFOL ;  Surgeon: Cassie Click, MD;  Location: [facility] ENDOSCOPY;  Service: Endoscopy;  Laterality: N/A;   COLONOSCOPY WITH PROPOFOL  N/A 01/27/2019   Procedure: COLONOSCOPY WITH PROPOFOL ;  Surgeon: Cassie Click, MD;  Location: [facility] ENDOSCOPY;  Service: Endoscopy;  Laterality: N/A;   cryo surgery of prostate     CYSTOSCOPY WITH BIOPSY N/A 11/19/2020   Procedure: CYSTOSCOPY WITH POSSIBLE BLADDER BIOPSY;  Surgeon: Geraline Knapp, MD;  Location: [facility] ORS;  Service: Urology;  Laterality: N/A;   ESOPHAGOGASTRODUODENOSCOPY N/A 01/27/2019   Procedure: ESOPHAGOGASTRODUODENOSCOPY (EGD);  Surgeon: Cassie Click, MD;  Location: [facility] ENDOSCOPY;  Service: Endoscopy;  Laterality: N/A;   ESOPHAGOGASTRODUODENOSCOPY (EGD) WITH PROPOFOL  N/A 07/19/2015   Procedure: ESOPHAGOGASTRODUODENOSCOPY (EGD) WITH PROPOFOL ;  Surgeon: Cassie Click, MD;  Location: [facility] ENDOSCOPY;  Service: Endoscopy;  Laterality: N/A;   JOINT REPLACEMENT     both knees   LAPAROTOMY N/A 03/08/2015   Procedure: EXPLORATORY LAPAROTOMY;  Surgeon: Lillette Reid III, MD;  Location: [facility] OR;  Service: General;  Laterality: N/A;   LAPAROTOMY N/A 03/09/2015   Procedure:  EXPLORATORY LAPAROTOMY AND EVACUATION OF HEMATOMA;  Surgeon: Lillette Reid III, MD;  Location: [facility] OR;  Service: General;  Laterality: N/A;   LYSIS OF ADHESION N/A 03/08/2015   Procedure: LYSIS OF ADHESION;  Surgeon: Lillette Reid III, MD;  Location: [facility] OR;  Service: General;  Laterality: N/A;   OMENTECTOMY N/A 03/08/2015   Procedure: OMENTECTOMY;  Surgeon: Lillette Reid III, MD;  Location: [facility] OR;  Service: General;  Laterality: N/A;   TEE WITHOUT CARDIOVERSION N/A 03/13/2015    Procedure: TRANSESOPHAGEAL ECHOCARDIOGRAM (TEE);  Surgeon: Hugh Madura, MD;  Location: [facility] ENDOSCOPY;  Service: Cardiovascular;  Laterality: N/A;   TOTAL KNEE ARTHROPLASTY Left 04/08/2016   Procedure: LEFT TOTAL KNEE ARTHROPLASTY;  Surgeon: Wes Hamman, MD;  [facility];  Service: Orthopedics;  Laterality: Left;   TOTAL KNEE ARTHROPLASTY Right 12/04/2019   Procedure: RIGHT TOTAL KNEE ARTHROPLASTY;  Surgeon: Wes Hamman, MD;  [facility] OR;  Service: Orthopedics;  Laterality: Right;   UMBILICAL HERNIA REPAIR  03/08/2015   Procedure: UMBILICAL HERNIA REPAIR  ADULT;  Surgeon: Lillette Reid III, MD;  [facility];  Service: General;;   VENTRAL HERNIA REPAIR N/A 03/08/2015   Procedure:  VENTRAL HERNIA  REPAIR ADULT;  Surgeon: Lillette Reid III, MD;  Location: [facility] OR;  Service: General;  Laterality: N/A;    Home Medications:  Allergies as of 12/02/2023       Reactions   Statins Swelling, Other (See Comments)   Legs swelled up and cramped up really bad        Medication List        Accurate as of Dec 02, 2023 11:59 PM. If you have any questions, ask your nurse or doctor.          Acetaminophen  500 MG capsule Take by mouth.   ALLERGY RELIEF PO Take 1 Dose by mouth daily. Gets [employer] brand   amoxicillin  500 MG tablet Commonly known as: AMOXIL  take 4 tabs po one hour prior to dental work.   apixaban  5 MG Tabs tablet Commonly known as: ELIQUIS  Take 5 mg by mouth 2 (two) times daily.   donepezil  10 MG tablet Commonly known as: ARICEPT  TAKE 1 TABLET BY MOUTH NIGHTLY   ferrous sulfate 325 (65 FE) MG tablet Take 325 mg by mouth in the morning and at bedtime.   hydrochlorothiazide  25 MG tablet Commonly known as: HYDRODIURIL  Take 25 mg by mouth daily.   meclizine  12.5 MG tablet Commonly known as: ANTIVERT  Take 12.5 mg by mouth daily.   memantine  10 MG tablet Commonly known as: NAMENDA  Take 1 tablet (10 mg total) by mouth 2 (two) times daily.   metoprolol  tartrate 100  MG tablet Commonly known as: LOPRESSOR  Take 1 tablet (100 mg total) by mouth 2 (two) times daily.   omeprazole 20 MG capsule Commonly known as: PRILOSEC Take 20 mg by mouth daily.   Red Yeast Rice Extract 600 MG Caps Take 600 mg by mouth 2 (two) times daily.   REFRESH OPTIVE PF OP Place 1 drop into both eyes 2 (two) times daily as needed (dry eyes).   trospium  20 MG tablet Commonly known as: SANCTURA  TAKE ONE (1) TABLET BY MOUTH TWO TIMES PER DAY   Vitamin D3 125 MCG (5000 UT) Tabs Take 5,000 Units by mouth daily.        Allergies:  Allergies  Allergen Reactions   Statins Swelling and Other (See Comments)    Legs swelled up and cramped up really bad  Family History: No family history on file.  Social History:   reports that he quit smoking about 35 years ago. His smoking use included cigarettes. He started smoking about 55 years ago. He has been exposed to tobacco smoke. He has never used smokeless tobacco. He reports that he does not drink alcohol  and does not use drugs.  Physical Exam: Ht 5\' 4"  (1.626 m)   Wt 200 lb (90.7 kg)   BMI 34.33 kg/m   Constitutional:  Alert and oriented, no acute distress, nontoxic appearing HEENT: [place], AT Cardiovascular: No clubbing, cyanosis.  1+ pitting edema of the BLEs. Respiratory: Normal respiratory effort, no increased work of breathing Skin: No rashes, bruises or suspicious lesions Neurologic: Grossly intact, no focal deficits, moving all 4 extremities Psychiatric: Normal mood and affect  Assessment & Plan:   1. Urge incontinence (Primary) We discussed that unfortunately I do not have anything new to offer him at this time.  We reviewed third line therapies and I reiterated Dr. Ned Balint concern regarding his increased risk for incomplete bladder emptying with intravesical Botox.  We discussed that of these options, PTNS would be considered the least invasive, however the patient would like to avoid therapies that involve  needles.  Notably, his stepdaughter feels that he would not be able to perform CIC.  We discussed that nocturia is often a manifestation of other medical problems.  He did have some pitting edema on exam today, so I encouraged him to start wearing compression socks during the day.  We also discussed considering a sleep study with his PCP.  I recommended optimizing him from these perspectives and then considering pursuing third line therapies, and they agreed.  Return if symptoms worsen or fail to improve.  Kathreen Pare, PA-C  [facility] [address] [place], [place] [address] [phone]

## 2023-12-14 DIAGNOSIS — I252 Old myocardial infarction: Secondary | ICD-10-CM | POA: Diagnosis not present

## 2023-12-14 DIAGNOSIS — I10 Essential (primary) hypertension: Secondary | ICD-10-CM | POA: Diagnosis not present

## 2023-12-14 DIAGNOSIS — I480 Paroxysmal atrial fibrillation: Secondary | ICD-10-CM | POA: Diagnosis not present

## 2023-12-14 DIAGNOSIS — I2510 Atherosclerotic heart disease of native coronary artery without angina pectoris: Secondary | ICD-10-CM | POA: Diagnosis not present

## 2023-12-14 DIAGNOSIS — I5022 Chronic systolic (congestive) heart failure: Secondary | ICD-10-CM | POA: Diagnosis not present

## 2023-12-21 DIAGNOSIS — I5022 Chronic systolic (congestive) heart failure: Secondary | ICD-10-CM | POA: Diagnosis not present

## 2024-01-11 DIAGNOSIS — I10 Essential (primary) hypertension: Secondary | ICD-10-CM | POA: Diagnosis not present

## 2024-01-11 DIAGNOSIS — I420 Dilated cardiomyopathy: Secondary | ICD-10-CM | POA: Diagnosis not present

## 2024-01-11 DIAGNOSIS — I5022 Chronic systolic (congestive) heart failure: Secondary | ICD-10-CM | POA: Diagnosis not present

## 2024-01-12 ENCOUNTER — Other Ambulatory Visit: Payer: Self-pay | Admitting: Neurology

## 2024-01-12 NOTE — Telephone Encounter (Signed)
 Refills should come from provider Dr.Russo

## 2024-01-18 DIAGNOSIS — I480 Paroxysmal atrial fibrillation: Secondary | ICD-10-CM | POA: Diagnosis not present

## 2024-01-18 DIAGNOSIS — I110 Hypertensive heart disease with heart failure: Secondary | ICD-10-CM | POA: Diagnosis not present

## 2024-01-18 DIAGNOSIS — E559 Vitamin D deficiency, unspecified: Secondary | ICD-10-CM | POA: Diagnosis not present

## 2024-02-08 DIAGNOSIS — I5022 Chronic systolic (congestive) heart failure: Secondary | ICD-10-CM | POA: Diagnosis not present

## 2024-02-08 DIAGNOSIS — I480 Paroxysmal atrial fibrillation: Secondary | ICD-10-CM | POA: Diagnosis not present

## 2024-02-22 DIAGNOSIS — I83812 Varicose veins of left lower extremities with pain: Secondary | ICD-10-CM | POA: Diagnosis not present

## 2024-02-24 ENCOUNTER — Other Ambulatory Visit (INDEPENDENT_AMBULATORY_CARE_PROVIDER_SITE_OTHER): Payer: Self-pay | Admitting: Family Medicine

## 2024-02-24 DIAGNOSIS — I83812 Varicose veins of left lower extremities with pain: Secondary | ICD-10-CM

## 2024-02-28 ENCOUNTER — Ambulatory Visit (INDEPENDENT_AMBULATORY_CARE_PROVIDER_SITE_OTHER)

## 2024-02-28 ENCOUNTER — Encounter: Payer: Self-pay | Admitting: Urology

## 2024-02-28 DIAGNOSIS — I83812 Varicose veins of left lower extremities with pain: Secondary | ICD-10-CM | POA: Diagnosis not present

## 2024-03-13 ENCOUNTER — Encounter (INDEPENDENT_AMBULATORY_CARE_PROVIDER_SITE_OTHER): Admitting: Nurse Practitioner

## 2024-03-20 ENCOUNTER — Emergency Department

## 2024-03-20 ENCOUNTER — Other Ambulatory Visit: Payer: Self-pay

## 2024-03-20 ENCOUNTER — Inpatient Hospital Stay
Admission: EM | Admit: 2024-03-20 | Discharge: 2024-03-25 | DRG: 291 | Disposition: A | Discharge status: Home / Self Care | Attending: Internal Medicine | Admitting: Internal Medicine

## 2024-03-20 DIAGNOSIS — Z79899 Other long term (current) drug therapy: Secondary | ICD-10-CM

## 2024-03-20 DIAGNOSIS — R079 Chest pain, unspecified: Secondary | ICD-10-CM | POA: Diagnosis not present

## 2024-03-20 DIAGNOSIS — N400 Enlarged prostate without lower urinary tract symptoms: Secondary | ICD-10-CM | POA: Diagnosis not present

## 2024-03-20 DIAGNOSIS — F0280 Dementia in other diseases classified elsewhere without behavioral disturbance: Secondary | ICD-10-CM | POA: Diagnosis not present

## 2024-03-20 DIAGNOSIS — E785 Hyperlipidemia, unspecified: Secondary | ICD-10-CM | POA: Diagnosis not present

## 2024-03-20 DIAGNOSIS — I472 Ventricular tachycardia, unspecified: Secondary | ICD-10-CM | POA: Diagnosis not present

## 2024-03-20 DIAGNOSIS — I255 Ischemic cardiomyopathy: Secondary | ICD-10-CM | POA: Diagnosis present

## 2024-03-20 DIAGNOSIS — I5023 Acute on chronic systolic (congestive) heart failure: Secondary | ICD-10-CM | POA: Diagnosis not present

## 2024-03-20 DIAGNOSIS — Z955 Presence of coronary angioplasty implant and graft: Secondary | ICD-10-CM | POA: Diagnosis not present

## 2024-03-20 DIAGNOSIS — Z87891 Personal history of nicotine dependence: Secondary | ICD-10-CM | POA: Diagnosis not present

## 2024-03-20 DIAGNOSIS — Z6829 Body mass index (BMI) 29.0-29.9, adult: Secondary | ICD-10-CM

## 2024-03-20 DIAGNOSIS — I4892 Unspecified atrial flutter: Secondary | ICD-10-CM | POA: Diagnosis present

## 2024-03-20 DIAGNOSIS — F02A18 Dementia in other diseases classified elsewhere, mild, with other behavioral disturbance: Secondary | ICD-10-CM | POA: Diagnosis not present

## 2024-03-20 DIAGNOSIS — I110 Hypertensive heart disease with heart failure: Principal | ICD-10-CM | POA: Diagnosis present

## 2024-03-20 DIAGNOSIS — I700 Atherosclerosis of aorta: Secondary | ICD-10-CM | POA: Diagnosis present

## 2024-03-20 DIAGNOSIS — T502X5A Adverse effect of carbonic-anhydrase inhibitors, benzothiadiazides and other diuretics, initial encounter: Secondary | ICD-10-CM | POA: Diagnosis not present

## 2024-03-20 DIAGNOSIS — Z96653 Presence of artificial knee joint, bilateral: Secondary | ICD-10-CM | POA: Diagnosis not present

## 2024-03-20 DIAGNOSIS — Z7901 Long term (current) use of anticoagulants: Secondary | ICD-10-CM | POA: Diagnosis not present

## 2024-03-20 DIAGNOSIS — I480 Paroxysmal atrial fibrillation: Secondary | ICD-10-CM | POA: Diagnosis present

## 2024-03-20 DIAGNOSIS — M51369 Other intervertebral disc degeneration, lumbar region without mention of lumbar back pain or lower extremity pain: Secondary | ICD-10-CM | POA: Diagnosis present

## 2024-03-20 DIAGNOSIS — I2510 Atherosclerotic heart disease of native coronary artery without angina pectoris: Secondary | ICD-10-CM | POA: Diagnosis present

## 2024-03-20 DIAGNOSIS — I509 Heart failure, unspecified: Principal | ICD-10-CM

## 2024-03-20 DIAGNOSIS — R0989 Other specified symptoms and signs involving the circulatory and respiratory systems: Secondary | ICD-10-CM | POA: Diagnosis not present

## 2024-03-20 DIAGNOSIS — I5021 Acute systolic (congestive) heart failure: Secondary | ICD-10-CM | POA: Diagnosis not present

## 2024-03-20 DIAGNOSIS — Z8546 Personal history of malignant neoplasm of prostate: Secondary | ICD-10-CM

## 2024-03-20 DIAGNOSIS — I10 Essential (primary) hypertension: Secondary | ICD-10-CM | POA: Diagnosis present

## 2024-03-20 DIAGNOSIS — E663 Overweight: Secondary | ICD-10-CM | POA: Diagnosis not present

## 2024-03-20 DIAGNOSIS — N179 Acute kidney failure, unspecified: Secondary | ICD-10-CM | POA: Diagnosis not present

## 2024-03-20 DIAGNOSIS — Z8616 Personal history of COVID-19: Secondary | ICD-10-CM | POA: Diagnosis not present

## 2024-03-20 DIAGNOSIS — Z888 Allergy status to other drugs, medicaments and biological substances status: Secondary | ICD-10-CM | POA: Diagnosis not present

## 2024-03-20 DIAGNOSIS — G309 Alzheimer's disease, unspecified: Secondary | ICD-10-CM | POA: Diagnosis not present

## 2024-03-20 DIAGNOSIS — R0902 Hypoxemia: Secondary | ICD-10-CM | POA: Diagnosis present

## 2024-03-20 DIAGNOSIS — M1990 Unspecified osteoarthritis, unspecified site: Secondary | ICD-10-CM | POA: Diagnosis present

## 2024-03-20 DIAGNOSIS — E876 Hypokalemia: Secondary | ICD-10-CM | POA: Diagnosis not present

## 2024-03-20 DIAGNOSIS — K219 Gastro-esophageal reflux disease without esophagitis: Secondary | ICD-10-CM | POA: Diagnosis present

## 2024-03-20 DIAGNOSIS — R918 Other nonspecific abnormal finding of lung field: Secondary | ICD-10-CM | POA: Diagnosis not present

## 2024-03-20 DIAGNOSIS — I4891 Unspecified atrial fibrillation: Secondary | ICD-10-CM | POA: Diagnosis present

## 2024-03-20 DIAGNOSIS — F03918 Unspecified dementia, unspecified severity, with other behavioral disturbance: Secondary | ICD-10-CM | POA: Diagnosis present

## 2024-03-20 DIAGNOSIS — R0602 Shortness of breath: Secondary | ICD-10-CM | POA: Diagnosis not present

## 2024-03-20 DIAGNOSIS — Z87442 Personal history of urinary calculi: Secondary | ICD-10-CM

## 2024-03-20 DIAGNOSIS — I252 Old myocardial infarction: Secondary | ICD-10-CM

## 2024-03-20 DIAGNOSIS — I5022 Chronic systolic (congestive) heart failure: Secondary | ICD-10-CM | POA: Diagnosis not present

## 2024-03-20 LAB — BASIC METABOLIC PANEL WITH GFR
Anion gap: 9 (ref 5–15)
BUN: 17 mg/dL (ref 8–23)
CO2: 21 mmol/L — ABNORMAL LOW (ref 22–32)
Calcium: 8.8 mg/dL — ABNORMAL LOW (ref 8.9–10.3)
Chloride: 106 mmol/L (ref 98–111)
Creatinine, Ser: 0.88 mg/dL (ref 0.61–1.24)
GFR, Estimated: >60 mL/min (ref >60)
Glucose, Bld: 123 mg/dL — ABNORMAL HIGH (ref 70–99)
Potassium: 3.6 mmol/L (ref 3.5–5.1)
Sodium: 136 mmol/L (ref 135–145)

## 2024-03-20 LAB — CBC
HCT: 42.5 % (ref 39.0–52.0)
Hemoglobin: 14.3 g/dL (ref 13.0–17.0)
MCH: 30.4 pg (ref 26.0–34.0)
MCHC: 33.6 g/dL (ref 30.0–36.0)
MCV: 90.4 fL (ref 80.0–100.0)
Platelets: 216 K/uL (ref 150–400)
RBC: 4.7 MIL/uL (ref 4.22–5.81)
RDW: 14.6 % (ref 11.5–15.5)
WBC: 7 K/uL (ref 4.0–10.5)
nRBC: 0 % (ref 0.0–0.2)

## 2024-03-20 LAB — BRAIN NATRIURETIC PEPTIDE: B Natriuretic Peptide: 1729.3 pg/mL — ABNORMAL HIGH (ref 0.0–100.0)

## 2024-03-20 LAB — TROPONIN I (HIGH SENSITIVITY)
Troponin I (High Sensitivity): 23 ng/L — ABNORMAL HIGH (ref <18)
Troponin I (High Sensitivity): 22 ng/L — ABNORMAL HIGH (ref <18)

## 2024-03-20 MED ORDER — ONDANSETRON HCL 4 MG/2ML IJ SOLN: 4.0000 mg | Freq: Four times a day (QID) | INTRAMUSCULAR | Status: DC | PRN

## 2024-03-20 MED ORDER — APIXABAN 5 MG PO TABS
5.0000 mg | ORAL_TABLET | Freq: Two times a day (BID) | ORAL | Status: DC
  Administered 2024-03-20 – 2024-03-25 (×10): 5 mg via ORAL
  Filled 2024-03-20 (×10): qty 1

## 2024-03-20 MED ORDER — FUROSEMIDE 10 MG/ML IJ SOLN: 40.0000 mg | Freq: Two times a day (BID) | INTRAMUSCULAR | Status: DC

## 2024-03-20 MED ORDER — METOPROLOL TARTRATE 50 MG PO TABS
100.0000 mg | ORAL_TABLET | Freq: Two times a day (BID) | ORAL | Status: DC
  Administered 2024-03-20 – 2024-03-21 (×3): 100 mg via ORAL
  Filled 2024-03-20: qty 2
  Filled 2024-03-20 (×2): qty 4

## 2024-03-20 MED ORDER — ACETAMINOPHEN 325 MG PO TABS
650.0000 mg | ORAL_TABLET | Freq: Four times a day (QID) | ORAL | Status: DC | PRN
  Administered 2024-03-24: 650 mg via ORAL
  Filled 2024-03-20: qty 2

## 2024-03-20 MED ORDER — IPRATROPIUM-ALBUTEROL 0.5-2.5 (3) MG/3ML IN SOLN
3.0000 mL | RESPIRATORY_TRACT | Status: AC
  Administered 2024-03-20: 3 mL via RESPIRATORY_TRACT
  Filled 2024-03-20: qty 3

## 2024-03-20 MED ORDER — ONDANSETRON HCL 4 MG PO TABS: 4.0000 mg | ORAL_TABLET | Freq: Four times a day (QID) | ORAL | Status: DC | PRN

## 2024-03-20 MED ORDER — BARRIER CREAM NON-SPECIFIED: 1.0000 | TOPICAL_CREAM | Freq: Two times a day (BID) | TOPICAL | Status: DC | PRN

## 2024-03-20 MED ORDER — MORPHINE SULFATE (PF) 2 MG/ML IV SOLN
2.0000 mg | Freq: Once | INTRAVENOUS | Status: DC
  Filled 2024-03-20: qty 1

## 2024-03-20 MED ORDER — ACETAMINOPHEN 650 MG RE SUPP: 650.0000 mg | Freq: Four times a day (QID) | RECTAL | Status: DC | PRN

## 2024-03-20 MED ORDER — POLYETHYLENE GLYCOL 3350 17 G PO PACK: 17.0000 g | PACK | Freq: Every day | ORAL | Status: DC | PRN

## 2024-03-20 MED ORDER — FUROSEMIDE 10 MG/ML IJ SOLN
20.0000 mg | Freq: Once | INTRAMUSCULAR | Status: AC
  Administered 2024-03-20: 20 mg via INTRAVENOUS
  Filled 2024-03-20: qty 4

## 2024-03-20 MED ORDER — ZINC OXIDE 40 % EX OINT: TOPICAL_OINTMENT | Freq: Two times a day (BID) | CUTANEOUS | Status: DC | PRN

## 2024-03-20 MED ORDER — FERROUS SULFATE 325 (65 FE) MG PO TABS
325.0000 mg | ORAL_TABLET | Freq: Every day | ORAL | Status: DC
  Administered 2024-03-21: 325 mg via ORAL
  Filled 2024-03-20: qty 1

## 2024-03-20 MED ORDER — DONEPEZIL HCL 5 MG PO TABS
10.0000 mg | ORAL_TABLET | Freq: Every day | ORAL | Status: DC
  Administered 2024-03-20 – 2024-03-24 (×5): 10 mg via ORAL
  Filled 2024-03-20 (×5): qty 2

## 2024-03-20 MED ORDER — FUROSEMIDE 10 MG/ML IJ SOLN
40.0000 mg | Freq: Two times a day (BID) | INTRAMUSCULAR | Status: DC
  Administered 2024-03-21 – 2024-03-24 (×8): 40 mg via INTRAVENOUS
  Filled 2024-03-20 (×8): qty 4

## 2024-03-20 MED ORDER — FUROSEMIDE 10 MG/ML IJ SOLN
60.0000 mg | Freq: Once | INTRAMUSCULAR | Status: AC
  Administered 2024-03-20: 60 mg via INTRAVENOUS
  Filled 2024-03-20: qty 8

## 2024-03-20 MED ORDER — IPRATROPIUM-ALBUTEROL 0.5-2.5 (3) MG/3ML IN SOLN
3.0000 mL | RESPIRATORY_TRACT | Status: DC | PRN
Start: 2024-03-20 — End: 2024-03-25

## 2024-03-20 NOTE — H&P (Signed)
 History and Physical    Jim Dodson FMW:[id number] DOB: [birth year] DOA: 03/20/2024  DOS: the patient was seen and examined on 03/20/2024  PCP: Onita Rush, MD   Patient coming from: Home  I have personally briefly reviewed patient's old medical records in [facility]  Chief Complaint: Shortness of breath  HPI: Jim Dodson is a pleasant 83 y.o. male with medical history significant for HFrEF, last EF 35%, GERD, HTN, HLD, dementia, paroxysmal A-fib who presented to ED complaining of shortness of breath on exertion.  Patient has a dementia and not able to provide meaningful history.  Patient's daughter was at bedside and she stated that patient has a congestive heart failure but not on any diuretics medication.  She stated that patient has been having shortness of breath on exertion for the last 2 days.  There is no fever, chills, cough, chest pain, palpitations.  ED Course: Upon arrival to the ED, patient is found to be in acute exacerbation of congestive heart failure, elevated BNP, chest x-ray showing congestion, swollen leg.  Patient was given Lasix  IV and hospitalist service was consulted for evaluation for admission for acute exacerbation of congestive heart failure.  Review of Systems:  ROS  All other systems negative except as noted in the HPI.  Past Medical History:  Diagnosis Date   Actinic keratosis    Aortic atherosclerosis (HCC)    Arthritis    Coronary artery disease    COVID-19    DDD (degenerative disc disease), lumbar    with associated multilevel impingement   Diastolic heart failure (HCC)    Diverticulosis    GERD (gastroesophageal reflux disease)    Headache    History of hiatal hernia    History of kidney stones    HLD (hyperlipidemia)    Hypertension    IDA (iron deficiency anemia)    Lumbar spondylolysis    Myelolipoma of right adrenal gland    Paroxysmal atrial fibrillation (HCC)    Prostate cancer (HCC)    Seborrheic dermatitis     Shortness of breath dyspnea    Subendocardial myocardial infarction [facility]) 07/2009   PCI with DES x 1 placed to LAD    Past Surgical History:  Procedure Laterality Date   CARDIAC CATHETERIZATION     2009, stent placed   CARDIOVERSION N/A 03/13/2015   Procedure: CARDIOVERSION;  Surgeon: Oneil JAYSON Parchment, MD;  Location: [facility] ENDOSCOPY;  Service: Cardiovascular;  Laterality: N/A;   COLONOSCOPY WITH PROPOFOL  N/A 07/19/2015   Procedure: COLONOSCOPY WITH PROPOFOL ;  Surgeon: Lamar ONEIDA Holmes, MD;  Location: [facility] ENDOSCOPY;  Service: Endoscopy;  Laterality: N/A;   COLONOSCOPY WITH PROPOFOL  N/A 01/27/2019   Procedure: COLONOSCOPY WITH PROPOFOL ;  Surgeon: Holmes Lamar ONEIDA, MD;  Location: [facility] ENDOSCOPY;  Service: Endoscopy;  Laterality: N/A;   cryo surgery of prostate     CYSTOSCOPY WITH BIOPSY N/A 11/19/2020   Procedure: CYSTOSCOPY WITH POSSIBLE BLADDER BIOPSY;  Surgeon: Twylla Glendia JAYSON, MD;  Location: [facility] ORS;  Service: Urology;  Laterality: N/A;   ESOPHAGOGASTRODUODENOSCOPY N/A 01/27/2019   Procedure: ESOPHAGOGASTRODUODENOSCOPY (EGD);  Surgeon: Holmes Lamar ONEIDA, MD;  Location: [facility] ENDOSCOPY;  Service: Endoscopy;  Laterality: N/A;   ESOPHAGOGASTRODUODENOSCOPY (EGD) WITH PROPOFOL  N/A 07/19/2015   Procedure: ESOPHAGOGASTRODUODENOSCOPY (EGD) WITH PROPOFOL ;  Surgeon: Lamar ONEIDA Holmes, MD;  Location: [facility] ENDOSCOPY;  Service: Endoscopy;  Laterality: N/A;   JOINT REPLACEMENT     both knees   LAPAROTOMY N/A 03/08/2015   Procedure: EXPLORATORY LAPAROTOMY;  Surgeon: Deward Null III,  MD;  Location: [facility] OR;  Service: General;  Laterality: N/A;   LAPAROTOMY N/A 03/09/2015   Procedure: EXPLORATORY LAPAROTOMY AND EVACUATION OF HEMATOMA;  Surgeon: Deward Null III, MD;  Location: [facility] OR;  Service: General;  Laterality: N/A;   LYSIS OF ADHESION N/A 03/08/2015   Procedure: LYSIS OF ADHESION;  Surgeon: Deward Null III, MD;  Location: [facility] OR;  Service: General;  Laterality: N/A;   OMENTECTOMY N/A 03/08/2015   Procedure: OMENTECTOMY;   Surgeon: Deward Null III, MD;  Location: [facility] OR;  Service: General;  Laterality: N/A;   TEE WITHOUT CARDIOVERSION N/A 03/13/2015   Procedure: TRANSESOPHAGEAL ECHOCARDIOGRAM (TEE);  Surgeon: Oneil JAYSON Parchment, MD;  Location: [facility] ENDOSCOPY;  Service: Cardiovascular;  Laterality: N/A;   TOTAL KNEE ARTHROPLASTY Left 04/08/2016   Procedure: LEFT TOTAL KNEE ARTHROPLASTY;  Surgeon: Kay CHRISTELLA Cummins, MD;  Location: [facility] OR;  Service: Orthopedics;  Laterality: Left;   TOTAL KNEE ARTHROPLASTY Right 12/04/2019   Procedure: RIGHT TOTAL KNEE ARTHROPLASTY;  Surgeon: Cummins Kay CHRISTELLA, MD;  Location: [facility] OR;  Service: Orthopedics;  Laterality: Right;   UMBILICAL HERNIA REPAIR  03/08/2015   Procedure: UMBILICAL HERNIA REPAIR  ADULT;  Surgeon: Deward Null III, MD;  Location: [facility] OR;  Service: General;;   VENTRAL HERNIA REPAIR N/A 03/08/2015   Procedure:  VENTRAL HERNIA  REPAIR ADULT;  Surgeon: Deward Null III, MD;  Location: [facility] OR;  Service: General;  Laterality: N/A;     reports that he quit smoking about 35 years ago. His smoking use included cigarettes. He started smoking about 55 years ago. He has been exposed to tobacco smoke. He has never used smokeless tobacco. He reports that he does not drink alcohol  and does not use drugs.  Allergies  Allergen Reactions   Statins Swelling and Other (See Comments)    Legs swelled up and cramped up really bad    History reviewed. No pertinent family history.  Prior to Admission medications   Medication Sig Start Date End Date Taking? Authorizing Provider  Acetaminophen  500 MG capsule Take by mouth.    [provider]  amoxicillin  (AMOXIL ) 500 MG tablet take 4 tabs po one hour prior to dental work. 02/21/21   Cummins Kay CHRISTELLA, MD  apixaban  (ELIQUIS ) 5 MG TABS tablet Take 5 mg by mouth 2 (two) times daily.    [provider]  Carboxymethylcellul-Glycerin (REFRESH OPTIVE PF OP) Place 1 drop into both eyes 2 (two) times daily as needed (dry eyes).    [provider]   Chlorpheniramine Maleate (ALLERGY RELIEF PO) Take 1 Dose by mouth daily. Gets [employer] brand    [provider]  Cholecalciferol  (VITAMIN D3) 5000 units TABS Take 5,000 Units by mouth daily.     [provider]  donepezil  (ARICEPT ) 10 MG tablet TAKE 1 TABLET BY MOUTH NIGHTLY 03/24/23   Sater, Charlie LABOR, MD  ferrous sulfate  325 (65 FE) MG tablet Take 325 mg by mouth in the morning and at bedtime.     [provider]  hydrochlorothiazide  (HYDRODIURIL ) 25 MG tablet Take 25 mg by mouth daily.    [provider]  meclizine  (ANTIVERT ) 12.5 MG tablet Take 12.5 mg by mouth daily.    [provider]  memantine  (NAMENDA ) 10 MG tablet Take 1 tablet (10 mg total) by mouth 2 (two) times daily. 04/27/22   Sater, Charlie LABOR, MD  metoprolol  (LOPRESSOR ) 100 MG tablet Take 1 tablet (100 mg total) by mouth 2 (two) times daily. 07/27/15  Singh, Prashant K, MD  omeprazole (PRILOSEC) 20 MG capsule Take 20 mg by mouth daily.    [provider]  Red Yeast Rice Extract 600 MG CAPS Take 600 mg by mouth 2 (two) times daily.     [provider]  trospium  (SANCTURA ) 20 MG tablet TAKE ONE (1) TABLET BY MOUTH TWO TIMES PER DAY 10/27/23   Twylla Glendia BROCKS, MD    Physical Exam: Vitals:   03/20/24 1414 03/20/24 1416  BP: (!) 139/95   Pulse: 74   Resp: 18   Temp: 97.7 F (36.5 C)   SpO2: 96%   Weight:  77.1 kg  Height:  5' 7 (1.702 m)    Physical Exam   Constitutional: Alert, awake, calm, comfortable HEENT: Neck supple Respiratory: Clear to auscultation B/L, no wheezing, no rales.  Cardiovascular: Regular rate and rhythm, no murmurs / rubs / gallops. No extremity edema. 2+ pedal pulses. No carotid bruits.  Abdomen: Soft, no tenderness, Bowel sounds positive.  Musculoskeletal: no clubbing / cyanosis. Good ROM, no contractures. Normal muscle tone.  Skin: no rashes, lesions, ulcers. Neurologic: CN 2-12 grossly intact. Sensation intact, No focal  deficit identified Psychiatric: Alert and oriented x 3. Normal mood.    Labs on Admission: I have personally reviewed following labs and imaging studies  CBC: Recent Labs  Lab 03/20/24 0957  WBC 7.0  HGB 14.3  HCT 42.5  MCV 90.4  PLT 216   Basic Metabolic Panel: Recent Labs  Lab 03/20/24 0957  NA 136  K 3.6  CL 106  CO2 21*  GLUCOSE 123*  BUN 17  CREATININE 0.88  CALCIUM 8.8*   GFR: Estimated Creatinine Clearance: 60.5 mL/min (by C-G formula based on SCr of 0.88 mg/dL). Liver Function Tests: No results for input(s): AST, ALT, ALKPHOS, BILITOT, PROT, ALBUMIN  in the last 168 hours. No results for input(s): LIPASE, AMYLASE in the last 168 hours. No results for input(s): AMMONIA in the last 168 hours. Coagulation Profile: No results for input(s): INR, PROTIME in the last 168 hours. Cardiac Enzymes: Recent Labs  Lab 03/20/24 0957 03/20/24 1611  TROPONINIHS 23* 22*   BNP (last 3 results) Recent Labs    03/20/24 0957  BNP 1,729.3*   HbA1C: No results for input(s): HGBA1C in the last 72 hours. CBG: No results for input(s): GLUCAP in the last 168 hours. Lipid Profile: No results for input(s): CHOL, HDL, LDLCALC, TRIG, CHOLHDL, LDLDIRECT in the last 72 hours. Thyroid  Function Tests: No results for input(s): TSH, T4TOTAL, FREET4, T3FREE, THYROIDAB in the last 72 hours. Anemia Panel: No results for input(s): VITAMINB12, FOLATE, FERRITIN, TIBC, IRON, RETICCTPCT in the last 72 hours. Urine analysis:    Component Value Date/Time   COLORURINE YELLOW 11/30/2019 1030   APPEARANCEUR Clear 11/30/2022 1312   LABSPEC 1.013 11/30/2019 1030   PHURINE 7.0 11/30/2019 1030   GLUCOSEU Negative 11/30/2022 1312   HGBUR NEGATIVE 11/30/2019 1030   BILIRUBINUR Negative 11/30/2022 1312   KETONESUR NEGATIVE 11/30/2019 1030   PROTEINUR Negative 11/30/2022 1312   PROTEINUR NEGATIVE 11/30/2019 1030   UROBILINOGEN 1.0  03/08/2015 1936   NITRITE Negative 11/30/2022 1312   NITRITE NEGATIVE 11/30/2019 1030   LEUKOCYTESUR Negative 11/30/2022 1312   LEUKOCYTESUR NEGATIVE 11/30/2019 1030    Radiological Exams on Admission: I have personally reviewed images DG Chest 2 View Result Date: 03/20/2024 CLINICAL DATA:  Shortness of breath with labored breathing. EXAM: CHEST - 2 VIEW COMPARISON:  Radiographs 11/30/2019 and 07/25/2015. FINDINGS: The heart size is at  the upper limits of normal. There is aortic atherosclerosis with increased vascular congestion, fissural thickening and possible trace bilateral pleural effusions. No confluent airspace disease or pneumothorax. No acute osseous findings. There are mild degenerative changes in the spine. IMPRESSION: Increased vascular congestion and possible trace bilateral pleural effusions suggesting mild congestive heart failure. Electronically Signed   By: Elsie Perone M.D.   On: 03/20/2024 16:13    EKG: My personal interpretation of EKG shows: A-fib    Assessment/Plan Principal Problem:   CHF (congestive heart failure) (HCC) Active Problems:   Atrial fibrillation with rapid ventricular response (HCC)   Chronic coronary artery disease   GERD (gastroesophageal reflux disease)   Alzheimer's disease (HCC)    Assessment and Plan:  83 year old male W/PMH of congestive heart failure not on any diuretics at home, atrial fibrillation, CAD, GERD, Alzheimer's disease who was brought in to the hospital complaining of shortness of breath.  1.  Acute exacerbation of systolic congestive heart failure - Will admit him to hospital as inpatient in telemetry - He has congestion on his lungs, chest x-ray shows congestive changes with some pleural effusion - He has exertional hypoxemia - He was given 60 mg Lasix  in the ED with some improvement - He will be scheduled on 40 mg IV Lasix  - He will be placed on congestive heart failure pathway  2.  Atrial fibrillation with  controlled ventricular response - Monitor on telemetry - Continue home medications - Continue Eliquis   3.  GERD - Continue Protonix   4. Altheimer's disease - Stable - Continue supportive care  5.  Deconditioning - PT/OT     DVT prophylaxis: Eliquis  Code Status: Full Code Family Communication: Wife and step daughter  Disposition Plan: Home  Consults called: None  Admission status: Inpatient, Telemetry bed   Nena Rebel, MD [facility] 03/20/2024, 5:39 PM

## 2024-03-20 NOTE — ED Notes (Signed)
 Urine sent to lab

## 2024-03-20 NOTE — ED Provider Notes (Signed)
 [facility] Provider Note    Event Date/Time   First MD Initiated Contact with Patient 03/20/24 1537     (approximate)  History   Chief Complaint: Shortness of Breath  HPI  Jim Dodson is a 83 y.o. male with a past medical history of gastric reflux, hypertension, hyperlipidemia, paroxysmal atrial fibrillation, presents to the emergency department for shortness of breath.  According to the daughter who is here with the patient she states for the last 2 days or so the patient has been experiencing some shortness of breath.  Patient denies any symptoms currently.  No fever or cough.  Daughter states the patient has a history of atrial fibrillation which she believes could be contributing to his shortness of breath.     Physical Exam   Triage Vital Signs: ED Triage Vitals  Encounter Vitals Group     BP 03/20/24 1414 (!) 139/95     Girls Systolic BP Percentile --      Girls Diastolic BP Percentile --      Boys Systolic BP Percentile --      Boys Diastolic BP Percentile --      Pulse Rate 03/20/24 1414 74     Resp 03/20/24 1414 18     Temp 03/20/24 1414 97.7 F (36.5 C)     Temp src --      SpO2 03/20/24 1414 96 %     Weight 03/20/24 1416 170 lb (77.1 kg)     Height 03/20/24 1416 5' 7 (1.702 m)     Head Circumference --      Peak Flow --      Pain Score 03/20/24 1414 0     Pain Loc --      Pain Education --      Exclude from Growth Chart --     Most recent vital signs: Vitals:   03/20/24 1414  BP: (!) 139/95  Pulse: 74  Resp: 18  Temp: 97.7 F (36.5 C)  SpO2: 96%    General: Awake, no distress.  CV:  Good peripheral perfusion.  Regular rate and rhythm  Resp:  Normal effort.  Equal breath sounds bilaterally.  No wheeze rales or rhonchi. Abd:  No distention.  Soft, nontender.  No rebound or guarding.  Ventral/incisional hernia present again nontender to palpation with no skin color changes suspect this is chronic.  ED Results /  Procedures / Treatments   EKG  EKG viewed and interpreted by me shows what appears to be atrial fibrillation at 90 bpm with a narrow QRS, left axis deviation, largely normal intervals with no concerning ST changes  RADIOLOGY  I have reviewed interpret the chest x-ray images.  No obvious consolidation hypoventilation. Chest x-ray appears most consistent with CHF exacerbation with congestion pleural effusions.  MEDICATIONS ORDERED IN ED: Medications - No data to display   IMPRESSION / MDM / ASSESSMENT AND PLAN / ED COURSE  I reviewed the triage vital signs and the nursing notes.  Patient's presentation is most consistent with acute presentation with potential threat to life or bodily function.  Patient presents to the emergency department for shortness of breath noted by the daughter over the last couple days.  Denies any cough or fever.  No chest pain.  Will check labs including cardiac enzymes x 2 we will check a BNP and a chest x-ray.  Patient CBC shows no concerning findings chemistry is reassuring as well troponin slightly elevated at 23.  Will repeat  after 2 hours.  Currently patient is lying in bed, no distress with no complaints, no apparent shortness of breath satting in the upper 90s on room air.  Patient continues to sat in the mid 90s while sitting still but will desat to around 90 with any movement even in the bed and appears visibly short of breath.  Patient's chest x-ray is consistent with CHF.  Patient's BNP is elevated to 1700.  Reassuringly CBC and chemistry are normal troponin is unchanged after 2 hours.  Discussed with the family they would prefer admission which I believe is reasonable given the patient's age mild dementia and worsening symptoms.  Will dose IV Lasix  and admit for CHF exacerbation.  FINAL CLINICAL IMPRESSION(S) / ED DIAGNOSES   Dyspnea CHF exacerbation  Note:  This document was prepared using Dragon voice recognition software and may include  unintentional dictation errors.   Dorothyann Drivers, MD 03/20/24 1705

## 2024-03-20 NOTE — ED Triage Notes (Signed)
 First nurse note: Pt to ED via [facility] from home. Family reports sudden episode of chest discomfort and SOB. Extensive cardiac hx. Pt is complaint free on arrival

## 2024-03-20 NOTE — ED Triage Notes (Signed)
 Pt comes via [facility] from home with sob and cp. Pt states he isn't having cp. Pt states his family thought he was. Pt does have some labored breathing noted.

## 2024-03-21 ENCOUNTER — Inpatient Hospital Stay (HOSPITAL_COMMUNITY)
Admit: 2024-03-21 | Discharge: 2024-03-21 | Disposition: A | Discharge status: Home / Self Care | Attending: Hospitalist | Admitting: Hospitalist

## 2024-03-21 DIAGNOSIS — I5021 Acute systolic (congestive) heart failure: Secondary | ICD-10-CM

## 2024-03-21 DIAGNOSIS — I5023 Acute on chronic systolic (congestive) heart failure: Secondary | ICD-10-CM | POA: Diagnosis not present

## 2024-03-21 LAB — ECHOCARDIOGRAM COMPLETE
AR max vel: 2.34 cm2
AV Area VTI: 2.23 cm2
AV Area mean vel: 2.1 cm2
AV Mean grad: 5 mmHg
AV Peak grad: 7.5 mmHg
Ao pk vel: 1.37 m/s
Area-P 1/2: 4.39 cm2
Calc EF: 35 %
Height: 67 in
MV M vel: 4.64 m/s
MV Peak grad: 86.1 mmHg
MV VTI: 2.49 cm2
Radius: 0.7 cm
S' Lateral: 5 cm
Single Plane A2C EF: 43.8 %
Single Plane A4C EF: 25.3 %
Weight: 2720 [oz_av]

## 2024-03-21 LAB — CBC
HCT: 42 % (ref 39.0–52.0)
Hemoglobin: 13.7 g/dL (ref 13.0–17.0)
MCH: 29.8 pg (ref 26.0–34.0)
MCHC: 32.6 g/dL (ref 30.0–36.0)
MCV: 91.3 fL (ref 80.0–100.0)
Platelets: 168 K/uL (ref 150–400)
RBC: 4.6 MIL/uL (ref 4.22–5.81)
RDW: 14.6 % (ref 11.5–15.5)
WBC: 7.8 K/uL (ref 4.0–10.5)
nRBC: 0 % (ref 0.0–0.2)

## 2024-03-21 LAB — COMPREHENSIVE METABOLIC PANEL WITH GFR
ALT: 21 U/L (ref 0–44)
AST: 22 U/L (ref 15–41)
Albumin: 3.1 g/dL — ABNORMAL LOW (ref 3.5–5.0)
Alkaline Phosphatase: 53 U/L (ref 38–126)
Anion gap: 9 (ref 5–15)
BUN: 18 mg/dL (ref 8–23)
CO2: 27 mmol/L (ref 22–32)
Calcium: 8.8 mg/dL — ABNORMAL LOW (ref 8.9–10.3)
Chloride: 104 mmol/L (ref 98–111)
Creatinine, Ser: 1.27 mg/dL — ABNORMAL HIGH (ref 0.61–1.24)
GFR, Estimated: 56 mL/min — ABNORMAL LOW (ref >60)
Glucose, Bld: 133 mg/dL — ABNORMAL HIGH (ref 70–99)
Potassium: 3.2 mmol/L — ABNORMAL LOW (ref 3.5–5.1)
Sodium: 140 mmol/L (ref 135–145)
Total Bilirubin: 1.1 mg/dL (ref 0.0–1.2)
Total Protein: 6.2 g/dL — ABNORMAL LOW (ref 6.5–8.1)

## 2024-03-21 LAB — CBG MONITORING, ED
Glucose-Capillary: 139 mg/dL — ABNORMAL HIGH (ref 70–99)
Glucose-Capillary: 126 mg/dL — ABNORMAL HIGH (ref 70–99)

## 2024-03-21 MED ORDER — HALOPERIDOL LACTATE 5 MG/ML IJ SOLN
1.0000 mg | Freq: Four times a day (QID) | INTRAMUSCULAR | Status: DC | PRN
  Administered 2024-03-22: 2 mg via INTRAMUSCULAR
  Filled 2024-03-21: qty 1

## 2024-03-21 MED ORDER — LORAZEPAM 0.5 MG PO TABS
0.5000 mg | ORAL_TABLET | ORAL | Status: DC | PRN
  Administered 2024-03-21 – 2024-03-24 (×2): 0.5 mg via ORAL
  Filled 2024-03-21 (×2): qty 1

## 2024-03-21 MED ORDER — FESOTERODINE FUMARATE ER 4 MG PO TB24
4.0000 mg | ORAL_TABLET | Freq: Every day | ORAL | Status: DC
  Administered 2024-03-21 – 2024-03-25 (×5): 4 mg via ORAL
  Filled 2024-03-21 (×5): qty 1

## 2024-03-21 MED ORDER — SACUBITRIL-VALSARTAN 24-26 MG PO TABS
1.0000 | ORAL_TABLET | Freq: Two times a day (BID) | ORAL | Status: DC
  Administered 2024-03-21 – 2024-03-25 (×8): 1 via ORAL
  Filled 2024-03-21 (×11): qty 1

## 2024-03-21 MED ORDER — POTASSIUM CHLORIDE CRYS ER 20 MEQ PO TBCR
40.0000 meq | EXTENDED_RELEASE_TABLET | Freq: Once | ORAL | Status: AC
  Administered 2024-03-21: 40 meq via ORAL
  Filled 2024-03-21: qty 2

## 2024-03-21 MED ORDER — PERFLUTREN LIPID MICROSPHERE
1.0000 mL | INTRAVENOUS | Status: AC | PRN
  Administered 2024-03-21: 2 mL via INTRAVENOUS

## 2024-03-21 NOTE — ED Notes (Signed)
 Pt ambulated from the bathroom to his bed. Pt is sitting on the edge of the bed with family at bedside. Call light is in reach.

## 2024-03-21 NOTE — ED Notes (Signed)
 Pt continues to be confused, requires frequent redirection.  Pt has voided several times since getting lasix .

## 2024-03-21 NOTE — ED Notes (Signed)
 Pt ambulatory to bathroom with standby assist, purewick removed by pt

## 2024-03-21 NOTE — ED Notes (Signed)
 Pt has been getting up frequently to void since getting lasix .  He has been a little agitated initially when bed alarm goes off but is redirectable.  Pt had a BM in the bathroom today and ate well and was able to feed himself.

## 2024-03-21 NOTE — ED Notes (Signed)
 Pt attempted to get out of bed stating I need to go to the bathroom. Pt escorted to the bathroom. This RN changed pts bed linens and chux pad as they were noted to be soiled. Pt wiped with bath wipes and readjusted in bed and given warm blankets.

## 2024-03-21 NOTE — ED Notes (Signed)
 Pt wife and step daughter are here visiting

## 2024-03-21 NOTE — TOC CM/SW Note (Signed)
..  Transition of Care [facility]) - Inpatient Brief Assessment   Patient Details  Name: Jim Dodson MRN: [id number] Date of Birth: 1941/01/30  Transition of Care [facility]) CM/SW Contact:    Edsel DELENA Fischer, LCSW Phone Number: 03/21/2024, 11:06 AM   Clinical Narrative:   SW to handoff to heart failure team to follow up with pt.  If additional needs are present, sw to address  Transition of Care Asessment:

## 2024-03-21 NOTE — Progress Notes (Addendum)
 Progress Note   Patient: Jim Dodson FMW:[id number] DOB: Dec 03, 1940 DOA: 03/20/2024     1 DOS: the patient was seen and examined on 03/21/2024   Brief hospital course: 83yo with h/o CAD, Alzheimer's dementia, afib on Eliquis , and chronic HFrEF who presented on 8/18 with SOB.  He is not on home Lasix  (takes hydrochlorothiazide ) and was found to be in acute heart failure.  Started on Lasix .  Echo pending.  Assessment and Plan:  Acute exacerbation of systolic congestive heart failure Patient presenting with SOB Admitted on telemetry Started on IV Lasix  Currently on room air Echo with EF 30-35% Cardiology is concerned that he remains mildly volume overloaded Will continue to diuresis overnight Continue Entresto  Possible dc on 8/20   Atrial fibrillation with controlled ventricular response Monitor on telemetry Rate controlled with metoprolol  Continue Eliquis   HTN Continue Entresto , metoprolol    GERD Continue Protonix    Alzheimer's disease Delirium precautions Continue donepezil , memantine   BPH Continue trospium  (fesoterodine  per formulary)   Deconditioning PT/OT consulted Family prefers home therapy if appropriate  Code status I have discussed code status with the family  [employer] documents reviewed They will discuss as a family but are inclined to change from full code to DNR They will notify nursing staff if such a change is desired so that his code status can be changed Full code for now    Consultants: Cardiology PT OT [facility] team  Procedures: Echocardiogram 8/19  Antibiotics: None   30 Day Unplanned Readmission Risk Score    Flowsheet Row ED to Hosp-Admission (Current) from 03/20/2024 in [facility] Emergency Department at [facility]  30 Day Unplanned Readmission Risk Score (%) 16.51 Filed at 03/21/2024 0801    This score is the patient's risk of an unplanned readmission within 30 days of being discharged (0 -100%). The score is based on  dignosis, age, lab data, medications, orders, and past utilization.   Low:  0-14.9   Medium: 15-21.9   High: 22-29.9   Extreme: 30 and above            Subjective:   I spoke with his step-daughter.  He lives with wife, step-daughters stay with caregivers 24/7 as well.  They would agree to rehab if needed.  Patient and his wife have not been as active as they need to be.  They would not consider going to a SNF, would strongly prefer in-home therapy.   Physical Exam: Vitals:   03/21/24 1100 03/21/24 1200 03/21/24 1404 03/21/24 1430  BP: 130/88 (!) 143/117 (!) 138/101 (!) 152/110  Pulse: 65 82 65 89  Resp: 19 16  (!) 28  Temp:  98 F (36.7 C)    TempSrc:  Oral    SpO2: 95% 96% 96% 96%  Weight:      Height:        Intake/Output Summary (Last 24 hours) at 03/21/2024 1634 Last data filed at 03/21/2024 1120 Gross per 24 hour  Intake --  Output 1100 ml  Net -1100 ml   Filed Weights   03/20/24 1416  Weight: 77.1 kg    Exam:  General:  Appears calm and comfortable and is in NAD, on RA Eyes:   normal lids, iris ENT:  grossly normal hearing, lips & tongue, mmm Cardiovascular:  RRR, no m/r/g. 1+ LE edema.  Respiratory:   CTA bilaterally with no wheezes/rales/rhonchi.  Normal respiratory effort. Abdomen:  soft, NT, ND Skin:  no rash or induration seen on limited exam Musculoskeletal:  grossly normal tone  BUE/BLE, good ROM, no bony abnormality Psychiatric:  pleasantly confused mood and affect, speech sparse but appropriate, AOx1-2 Neurologic:  CN 2-12 grossly intact, moves all extremities in coordinated fashion  Data Reviewed: I have reviewed the patient's lab results since admission.  Pertinent labs for today include:   K+ 3.2 Glucose 133 BUN 18/Creatinine 1.27/GFR 56; 17/0.88/>60 on presentation BNP 1729.3 Normal CBC    Family Communication: None present; I spoke with his NOK (step-daughter) at the time of admission  Disposition: Status is: Inpatient Remains  inpatient appropriate because: ongoing management  Planned Discharge Destination: Home with Home Health    Time spent: 50 minutes  Author: Delon Herald, MD 03/21/2024 4:34 PM  For on call review [email or web address].

## 2024-03-21 NOTE — ED Notes (Addendum)
 Pt is diaphoretic, denies pain or sob. Air turned down in room, fan given to pt and Dr. Devon notified

## 2024-03-21 NOTE — Progress Notes (Signed)
 Heart Failure Navigator Progress Note  Assessed for Heart & Vascular TOC clinic readiness.  Patient does not meet criteria due to current [facility] patient.   Navigator will sign off at this time.  Charmaine Pines, RN, BSN [facility] Heart Failure Navigator Secure Chat Only

## 2024-03-21 NOTE — Consult Note (Signed)
 [facility] CLINIC CARDIOLOGY CONSULT NOTE       Patient ID: [name] MRN: [id number] DOB/AGE: [birth year] 83 y.o.  Admit date: 03/20/2024 Referring Physician Dr. Barbarann Primary Physician Onita Rush, MD Primary Cardiologist Dr. Florencio Reason for Consultation AoCHF  HPI: Jim Dodson is a 83 y.o. male  with a past medical history of chronic HFrEF (35% - 11/2023), ischemic cardiomyopathy, history of myocardial infarction, coronary artery disease s/p stent, hypertension, hyperlipidemia, paroxysmal atrial fibrillation, orthostatic hypotension, dementia who presented to the ED on 03/20/2024 for worsening shortness of breath.  Patient denies any chest pain, palpitations or lightheadedness.  Patient is also followed with [facility] failure clinic most recently seen on 02/08/2024, patient's volume status was stable at that time.  Cardiology was consulted for further evaluation acute on chronic HFrEF.  Work up in the ED notable for sodium 136, potassium 3.6, creatinine 0.88, hemoglobin 14.3, platelets 216.  Troponins minimally elevated and flat 23 > 22. EKG with atrial flutter (rhythm not very clear), rate 90s with nonspecific ST-T waves changes.  BNP elevated at 1700.  CXR with pulmonary vascular congestion and bilateral trace pleural effusions.  Patient has received 1X of IV Lasix  60 mg and 1X of IV Lasix  40 mg.   Majority of patient's history obtained from chart review due to patients baseline dementia. At the time of my evaluation this AM, patient was resting comfortably in ED stretcher.  He came into the ED due to shortness of breath.  Patient endorses lower extremity swelling and reports his left leg is normally bigger than his right leg.  Denies any chest pain, palpitations, lightheadedness or decreased appetite.  Patient states his shortness of breath has improved since admission and he feels better today s/p IV lasix . On exam patient still appears volume up with bibasilar crackles and lower  extremity edema.   Review of systems complete and found to be negative unless listed above    Past Medical History:  Diagnosis Date   Actinic keratosis    Aortic atherosclerosis (HCC)    Arthritis    Coronary artery disease    COVID-19    DDD (degenerative disc disease), lumbar    with associated multilevel impingement   Diastolic heart failure (HCC)    Diverticulosis    GERD (gastroesophageal reflux disease)    Headache    History of hiatal hernia    History of kidney stones    HLD (hyperlipidemia)    Hypertension    IDA (iron deficiency anemia)    Lumbar spondylolysis    Myelolipoma of right adrenal gland    Paroxysmal atrial fibrillation (HCC)    Prostate cancer (HCC)    Seborrheic dermatitis    Shortness of breath dyspnea    Subendocardial myocardial infarction [facility]) 07/2009   PCI with DES x 1 placed to LAD    Past Surgical History:  Procedure Laterality Date   CARDIAC CATHETERIZATION     2009, stent placed   CARDIOVERSION N/A 03/13/2015   Procedure: CARDIOVERSION;  Surgeon: Oneil JAYSON Parchment, MD;  Location: [facility] ENDOSCOPY;  Service: Cardiovascular;  Laterality: N/A;   COLONOSCOPY WITH PROPOFOL  N/A 07/19/2015   Procedure: COLONOSCOPY WITH PROPOFOL ;  Surgeon: Lamar ONEIDA Holmes, MD;  Location: [facility] ENDOSCOPY;  Service: Endoscopy;  Laterality: N/A;   COLONOSCOPY WITH PROPOFOL  N/A 01/27/2019   Procedure: COLONOSCOPY WITH PROPOFOL ;  Surgeon: Holmes Lamar ONEIDA, MD;  Location: [facility] ENDOSCOPY;  Service: Endoscopy;  Laterality: N/A;   cryo surgery of prostate  CYSTOSCOPY WITH BIOPSY N/A 11/19/2020   Procedure: CYSTOSCOPY WITH POSSIBLE BLADDER BIOPSY;  Surgeon: Twylla Glendia BROCKS, MD;  Location: [facility] ORS;  Service: Urology;  Laterality: N/A;   ESOPHAGOGASTRODUODENOSCOPY N/A 01/27/2019   Procedure: ESOPHAGOGASTRODUODENOSCOPY (EGD);  Surgeon: Viktoria Lamar DASEN, MD;  Location: [facility] ENDOSCOPY;  Service: Endoscopy;  Laterality: N/A;   ESOPHAGOGASTRODUODENOSCOPY (EGD) WITH PROPOFOL  N/A  07/19/2015   Procedure: ESOPHAGOGASTRODUODENOSCOPY (EGD) WITH PROPOFOL ;  Surgeon: Lamar DASEN Viktoria, MD;  Location: [facility] ENDOSCOPY;  Service: Endoscopy;  Laterality: N/A;   JOINT REPLACEMENT     both knees   LAPAROTOMY N/A 03/08/2015   Procedure: EXPLORATORY LAPAROTOMY;  Surgeon: Deward Null III, MD;  Location: [facility] OR;  Service: General;  Laterality: N/A;   LAPAROTOMY N/A 03/09/2015   Procedure: EXPLORATORY LAPAROTOMY AND EVACUATION OF HEMATOMA;  Surgeon: Deward Null III, MD;  Location: [facility] OR;  Service: General;  Laterality: N/A;   LYSIS OF ADHESION N/A 03/08/2015   Procedure: LYSIS OF ADHESION;  Surgeon: Deward Null III, MD;  Location: [facility] OR;  Service: General;  Laterality: N/A;   OMENTECTOMY N/A 03/08/2015   Procedure: OMENTECTOMY;  Surgeon: Deward Null III, MD;  Location: [facility] OR;  Service: General;  Laterality: N/A;   TEE WITHOUT CARDIOVERSION N/A 03/13/2015   Procedure: TRANSESOPHAGEAL ECHOCARDIOGRAM (TEE);  Surgeon: Oneil BROCKS Parchment, MD;  Location: [facility] ENDOSCOPY;  Service: Cardiovascular;  Laterality: N/A;   TOTAL KNEE ARTHROPLASTY Left 04/08/2016   Procedure: LEFT TOTAL KNEE ARTHROPLASTY;  Surgeon: Kay CHRISTELLA Cummins, MD;  Location: [facility] OR;  Service: Orthopedics;  Laterality: Left;   TOTAL KNEE ARTHROPLASTY Right 12/04/2019   Procedure: RIGHT TOTAL KNEE ARTHROPLASTY;  Surgeon: Cummins Kay CHRISTELLA, MD;  Location: [facility] OR;  Service: Orthopedics;  Laterality: Right;   UMBILICAL HERNIA REPAIR  03/08/2015   Procedure: UMBILICAL HERNIA REPAIR  ADULT;  Surgeon: Deward Null III, MD;  Location: [facility] OR;  Service: General;;   VENTRAL HERNIA REPAIR N/A 03/08/2015   Procedure:  VENTRAL HERNIA  REPAIR ADULT;  Surgeon: Deward Null III, MD;  Location: [facility] OR;  Service: General;  Laterality: N/A;    (Not in a hospital admission)  Social History   Socioeconomic History   Marital status: Married    Spouse name: Probation officer   Number of children: Not on file   Years of education: Not on file   Highest education level: Not on file  Occupational History    Not on file  Tobacco Use   Smoking status: Former    Current packs/day: 0.00    Types: Cigarettes    Start date: 08/03/1968    Quit date: 08/03/1988    Years since quitting: 35.6    Passive exposure: Past   Smokeless tobacco: Never  Vaping Use   Vaping status: Never Used  Substance and Sexual Activity   Alcohol  use: No   Drug use: No   Sexual activity: Yes  Other Topics Concern   Not on file  Social History Narrative   Lives with wife   Left Handed   Drinks no caffeine   Social Drivers of Corporate investment banker Strain: Not on file  Food Insecurity: Not on file  Transportation Needs: Not on file  Physical Activity: Not on file  Stress: Not on file  Social Connections: Not on file  Intimate Partner Violence: Not on file    History reviewed. No pertinent family history.   Vitals:   03/21/24 0400 03/21/24 0500 03/21/24 0600 03/21/24 0800  BP: 123/67 127/80 135/87   Pulse: ROLLEN)  41 72 69   Resp: 18 (!) 21 17   Temp:    97.8 F (36.6 C)  TempSrc:    Oral  SpO2: 100% 97% 95%   Weight:      Height:        PHYSICAL EXAM General: Well-appearing elderly male, well nourished, in no acute distress. HEENT: Normocephalic and atraumatic. Neck: No JVD.   Lungs: Normal respiratory effort on room air.  Bibasilar crackles. Heart: HRRR. Normal S1 and S2 without gallops or murmurs.  Abdomen: Non-distended appearing.  Msk: Normal strength and tone for age. Extremities: Warm and well perfused. No clubbing, cyanosis. + 1 pitting edema L > R.  Neuro: Alert and oriented X 3. Psych: Answers questions appropriately.   Labs: Basic Metabolic Panel: Recent Labs    03/20/24 0957 03/21/24 0350  NA 136 140  K 3.6 3.2*  CL 106 104  CO2 21* 27  GLUCOSE 123* 133*  BUN 17 18  CREATININE 0.88 1.27*  CALCIUM 8.8* 8.8*   Liver Function Tests: Recent Labs    03/21/24 0350  AST 22  ALT 21  ALKPHOS 53  BILITOT 1.1  PROT 6.2*  ALBUMIN  3.1*   No results for input(s): LIPASE,  AMYLASE in the last 72 hours. CBC: Recent Labs    03/20/24 0957 03/21/24 0350  WBC 7.0 7.8  HGB 14.3 13.7  HCT 42.5 42.0  MCV 90.4 91.3  PLT 216 168   Cardiac Enzymes: Recent Labs    03/20/24 0957 03/20/24 1611  TROPONINIHS 23* 22*   BNP: Recent Labs    03/20/24 0957  BNP 1,729.3*   D-Dimer: No results for input(s): DDIMER in the last 72 hours. Hemoglobin A1C: No results for input(s): HGBA1C in the last 72 hours. Fasting Lipid Panel: No results for input(s): CHOL, HDL, LDLCALC, TRIG, CHOLHDL, LDLDIRECT in the last 72 hours. Thyroid  Function Tests: No results for input(s): TSH, T4TOTAL, T3FREE, THYROIDAB in the last 72 hours.  Invalid input(s): FREET3 Anemia Panel: No results for input(s): VITAMINB12, FOLATE, FERRITIN, TIBC, IRON, RETICCTPCT in the last 72 hours.   Radiology: DG Chest 2 View Result Date: 03/20/2024 CLINICAL DATA:  Shortness of breath with labored breathing. EXAM: CHEST - 2 VIEW COMPARISON:  Radiographs 11/30/2019 and 07/25/2015. FINDINGS: The heart size is at the upper limits of normal. There is aortic atherosclerosis with increased vascular congestion, fissural thickening and possible trace bilateral pleural effusions. No confluent airspace disease or pneumothorax. No acute osseous findings. There are mild degenerative changes in the spine. IMPRESSION: Increased vascular congestion and possible trace bilateral pleural effusions suggesting mild congestive heart failure. Electronically Signed   By: Elsie Perone M.D.   On: 03/20/2024 16:13   VAS US  LOWER EXTREMITY VENOUS REFLUX Result Date: 02/28/2024  Lower Venous Reflux Study Patient Name:  CRIMSON BEER  Date of Exam:   02/28/2024 Medical Rec #: [id number]        Accession #:    [id number] Date of Birth: [birth year]        Patient Gender: M Patient Age:   46 years Exam Location:  [facility] Procedure:      VAS US  LOWER EXTREMITY VENOUS REFLUX Referring  Phys: BEVERLEY JOURNEY --------------------------------------------------------------------------------  Indications: Swelling, and Pain.  Performing Technologist: Leafy Gibes RVS  Examination Guidelines: A complete evaluation includes B-mode imaging, spectral Doppler, color Doppler, and power Doppler as needed of all accessible portions of each vessel. Bilateral testing is considered an integral part of a complete examination. Limited  examinations for reoccurring indications may be performed as noted. The reflux portion of the exam is performed with the patient in reverse Trendelenburg. Significant venous reflux is defined as >500 ms in the superficial venous system, and >1 second in the deep venous system.  +--------------+---------+------+-----------+------------+--------+ LEFT          Reflux NoRefluxReflux TimeDiameter cmsComments                         Yes                                  +--------------+---------+------+-----------+------------+--------+ CFV           no                                             +--------------+---------+------+-----------+------------+--------+ FV prox       no                                             +--------------+---------+------+-----------+------------+--------+ FV mid        no                                             +--------------+---------+------+-----------+------------+--------+ FV dist       no                                             +--------------+---------+------+-----------+------------+--------+ Popliteal     no                                             +--------------+---------+------+-----------+------------+--------+ GSV at [facility]              yes    1320 ms      1.08             +--------------+---------+------+-----------+------------+--------+ GSV prox thigh          yes    1489 ms      .83              +--------------+---------+------+-----------+------------+--------+ GSV  mid thigh           yes    4482 ms      .77              +--------------+---------+------+-----------+------------+--------+ GSV dist thigh          yes    4526 ms      .78              +--------------+---------+------+-----------+------------+--------+ GSV at knee             yes    3191 ms      .72              +--------------+---------+------+-----------+------------+--------+ GSV prox calf  yes    3161 ms      .51              +--------------+---------+------+-----------+------------+--------+ SSV at [facility]    no                            .29              +--------------+---------+------+-----------+------------+--------+ Prox Calf               yes    3191 ms                       +--------------+---------+------+-----------+------------+--------+  Summary: Left: - No evidence of deep vein thrombosis seen in the left lower extremity, from the common femoral through the popliteal veins. - No evidence of superficial venous thrombosis in the left lower extremity. - There is no evidence of venous reflux seen in the left lower extremity. - Venous reflux is noted in the left sapheno-femoral junction. - Venous reflux is noted in the left greater saphenous vein.  *See table(s) above for measurements and observations. Electronically signed by Cordella Shawl MD on 02/28/2024 at 5:39:22 PM.    Final     ECHO 11/2023 MODERATE LEFT VENTRICULAR SYSTOLIC DYSFUNCTION WITH MILD LVH  ESTIMATED EF: 35%, CALC EF(2D): 34%  INDETERMINATE DIASTOLIC FUNCTION  NORMAL RIGHT VENTRICULAR SYSTOLIC FUNCTION  VALVULAR REGURGITATION: MILD AR, MILD MR, MILD PR, MILD TR  NO VALVULAR STENOSIS   TELEMETRY reviewed by me 03/21/2024: atrial fibrillation, rate 70-80s  EKG reviewed by me: EKG with atrial flutter (rhythm not very clear), rate 90s with nonspecific ST-T waves changes.  Data reviewed by me 03/21/2024: last 24h vitals tele labs imaging I/O ED provider note, admission  H&P.  Principal Problem:   CHF (congestive heart failure) (HCC) Active Problems:   Atrial fibrillation with rapid ventricular response (HCC)   Chronic coronary artery disease   GERD (gastroesophageal reflux disease)   Alzheimer's disease (HCC)    ASSESSMENT AND PLAN:  Jim Dodson is a 83 y.o. male  with a past medical history of chronic HFrEF (35% - 11/2023), ischemic cardiomyopathy, history of myocardial infarction, coronary artery disease s/p stent, hypertension, hyperlipidemia, paroxysmal atrial fibrillation, orthostatic hypotension, dementia who presented to the ED on 03/20/2024 for worsening shortness of breath.  Patient denies any chest pain, palpitations or lightheadedness.  Patient is also followed with [facility] failure clinic most recently seen on 02/08/2024, patient's volume status was stable at that time.  Cardiology was consulted for further evaluation acute on chronic HFrEF.  # Acute on chronic HFrEF (EF 35%) BNP elevated at 1700.  CXR with pulmonary vascular congestion and bilateral trace pleural effusions.  Prior echo from 11/2023 with EF of 35%.  Appears volume up. -Echo ordered. -Monitor and replenish electrolytes for a goal K >4, Mag >2  -Continue IV Lasix  40 mg twice daily.  Closely monitor UOP and renal function. -Continue metoprolol  as stated below. -Resume home Entresto  24-26 mg BID.  -Will resume home spironolactone  and empagliflozin when renal function stabilizes. -Will continue to optimize GDMT as BP and renal function allows.  # Coronary artery disease s/p stent # Hypertension # Hyperlipidemia Patient without chest pain. Troponins minimally elevated and flat 23 > 22. EKG with atrial flutter (rhythm not very clear), rate 90s with nonspecific ST-T waves changes.   -Continue metoprolol  and Entresto  as stated above. -Patient has documented statin allergy. -Minimally and  flat troponin within setting of acute on chronic HFrEF is most consistent with  demand/supply mismatch and not ACS.   # Paroxysmal atrial fibrillation Per tele in atrial fibrillation, rate 70-80s.  -Continue home Eliquis  5 mg twice daily for stroke risk reduction. -Continue home metoprolol  to tartrate 100 mg twice daily.   This patient's plan of care was discussed and created with Dr. Florencio and he is in agreement.  Signed: Dorene Comfort, PA-C  03/21/2024, 10:10 AM [facility] Cardiology

## 2024-03-21 NOTE — ED Notes (Signed)
 Pt continues to get up, bed alarm in place.  Pt agitated with staff but redirectable.

## 2024-03-21 NOTE — ED Notes (Signed)
 This tech and [place], [place], helped change the pt into clean sheets and a gown. Pt is now in bed, call bell in reach and bed alarm on.

## 2024-03-21 NOTE — ED Notes (Signed)
Pt was transported to floor

## 2024-03-22 ENCOUNTER — Telehealth (HOSPITAL_COMMUNITY): Payer: Self-pay | Admitting: Pharmacy Technician

## 2024-03-22 ENCOUNTER — Other Ambulatory Visit (HOSPITAL_COMMUNITY): Payer: Self-pay

## 2024-03-22 DIAGNOSIS — G309 Alzheimer's disease, unspecified: Secondary | ICD-10-CM

## 2024-03-22 DIAGNOSIS — F0280 Dementia in other diseases classified elsewhere without behavioral disturbance: Secondary | ICD-10-CM

## 2024-03-22 DIAGNOSIS — I509 Heart failure, unspecified: Secondary | ICD-10-CM

## 2024-03-22 DIAGNOSIS — I480 Paroxysmal atrial fibrillation: Secondary | ICD-10-CM

## 2024-03-22 DIAGNOSIS — I5023 Acute on chronic systolic (congestive) heart failure: Secondary | ICD-10-CM | POA: Insufficient documentation

## 2024-03-22 DIAGNOSIS — R0602 Shortness of breath: Secondary | ICD-10-CM

## 2024-03-22 LAB — BASIC METABOLIC PANEL WITH GFR
Anion gap: 12 (ref 5–15)
BUN: 23 mg/dL (ref 8–23)
CO2: 29 mmol/L (ref 22–32)
Calcium: 8.9 mg/dL (ref 8.9–10.3)
Chloride: 102 mmol/L (ref 98–111)
Creatinine, Ser: 1.27 mg/dL — ABNORMAL HIGH (ref 0.61–1.24)
GFR, Estimated: 56 mL/min — ABNORMAL LOW (ref >60)
Glucose, Bld: 119 mg/dL — ABNORMAL HIGH (ref 70–99)
Potassium: 3.3 mmol/L — ABNORMAL LOW (ref 3.5–5.1)
Sodium: 143 mmol/L (ref 135–145)

## 2024-03-22 MED ORDER — METOPROLOL SUCCINATE ER 100 MG PO TB24: 100.0000 mg | ORAL_TABLET | Freq: Every day | ORAL | Status: DC

## 2024-03-22 MED ORDER — METOPROLOL SUCCINATE ER 100 MG PO TB24
100.0000 mg | ORAL_TABLET | Freq: Two times a day (BID) | ORAL | Status: DC
  Administered 2024-03-22 – 2024-03-25 (×7): 100 mg via ORAL
  Filled 2024-03-22 (×7): qty 1

## 2024-03-22 MED ORDER — POTASSIUM CHLORIDE CRYS ER 20 MEQ PO TBCR
40.0000 meq | EXTENDED_RELEASE_TABLET | Freq: Two times a day (BID) | ORAL | Status: AC
Start: 2024-03-22 — End: 2024-03-22
  Administered 2024-03-22 (×2): 40 meq via ORAL
  Filled 2024-03-22 (×2): qty 2

## 2024-03-22 MED ORDER — METOPROLOL TARTRATE 5 MG/5ML IV SOLN
5.0000 mg | Freq: Four times a day (QID) | INTRAVENOUS | Status: DC | PRN
  Administered 2024-03-23: 5 mg via INTRAVENOUS
  Filled 2024-03-22: qty 5

## 2024-03-22 MED ORDER — SPIRONOLACTONE 25 MG PO TABS
25.0000 mg | ORAL_TABLET | Freq: Every day | ORAL | Status: DC
  Administered 2024-03-22 – 2024-03-24 (×3): 25 mg via ORAL
  Filled 2024-03-22 (×3): qty 1

## 2024-03-22 MED ORDER — DIGOXIN 0.25 MG/ML IJ SOLN
0.2500 mg | Freq: Once | INTRAMUSCULAR | Status: AC
  Administered 2024-03-22: 0.25 mg via INTRAVENOUS
  Filled 2024-03-22: qty 2

## 2024-03-22 NOTE — Plan of Care (Signed)
  Problem: Activity: Goal: Capacity to carry out activities will improve Outcome: Progressing   Problem: Activity: Goal: Risk for activity intolerance will decrease Outcome: Progressing   Problem: Nutrition: Goal: Adequate nutrition will be maintained Outcome: Progressing   Problem: Elimination: Goal: Will not experience complications related to urinary retention Outcome: Progressing   Problem: Pain Managment: Goal: General experience of comfort will improve and/or be controlled Outcome: Progressing

## 2024-03-22 NOTE — Hospital Course (Addendum)
 83yo with h/o CAD, Alzheimer's dementia, afib on Eliquis , and chronic HFrEF who presented on 8/18 with SOB. He is not on home Lasix  (takes hydrochlorothiazide ) and was found to be in acute heart failure. Started on Lasix .  Echocardiogram showed ejection fraction of 30 to 35%, diastolic function is indeterminate.  Patient condition finally improved, renal function slightly worsened today, medically stable for discharge.

## 2024-03-22 NOTE — Telephone Encounter (Signed)
 Patient Product/process development scientist completed.    The patient is insured through [facility]. Patient has Medicare and is not eligible for a copay card, but may be able to apply for patient assistance or Medicare RX Payment Plan (Patient Must reach out to their plan, if eligible for payment plan), if available.    Ran test claim for Farxiga 10 mg and the current 30 day co-pay is $0.00.  Ran test claim for Jardiance 10 mg and was last filled on 03/07/2024 can be filled again on 04/05/2024  This test claim was processed through [facility]- copay amounts may vary at other pharmacies due to pharmacy/plan contracts, or as the patient moves through the different stages of their insurance plan.     Reyes Sharps, CPHT Pharmacy Technician III Certified Patient Advocate [facility] Patient Advocate Team Direct Number: [phone]  Fax: [phone]

## 2024-03-22 NOTE — TOC CM/SW Note (Signed)
 Transition of Care [facility]) - Inpatient Brief Assessment   Patient Details  Name: Jim Dodson MRN: [id number] Date of Birth: 12/13/1940  [facility] of [facility]) CM/SW Contact:    Lauraine JAYSON Carpen, LCSW Phone Number: 03/22/2024, 9:50 AM   Clinical Narrative: CSW reviewed chart. No TOC needs identified at this time. CSW will continue to follow progress. Please place [facility] consult if any needs arise.  Transition of Care Asessment: Insurance and Status: Insurance coverage has been reviewed Patient has primary care physician: Yes Home environment has been reviewed: Single family home Prior level of function:: Not documented Prior/Current Home Services: No current home services Social Drivers of Health Review: SDOH reviewed no interventions necessary Readmission risk has been reviewed: Yes Transition of care needs: no transition of care needs at this time

## 2024-03-22 NOTE — Evaluation (Signed)
 Occupational Therapy Evaluation Patient Details Name: Jim Dodson MRN: [id number] DOB: 1941/03/04 Today's Date: 03/22/2024   History of Present Illness   Patient is a 83 year old male with shortness of breath, acute CHF exacerbation. PMH: dementia, CAD, chronic HFrEF   Clinical Impressions Mr Jim Dodson was seen for OT evaluation this date. Prior to hospital admission, pt was IND. Pt lives with spouse.  Pt currently requires SBA for ADL t/f ~150 ft, PRN use of railing for UE support. SUPERVISION standing grooming tasks. Pt appears near baseline function with no skilled acute OT needs identified, will sign off. Upon hospital discharge, recommend no OT follow up.     If plan is discharge home, recommend the following:   Supervision due to cognitive status     Functional Status Assessment   Patient has had a recent decline in their functional status and demonstrates the ability to make significant improvements in function in a reasonable and predictable amount of time.     Equipment Recommendations   None recommended by OT     Recommendations for Other Services         Precautions/Restrictions   Precautions Precautions: Fall Recall of Precautions/Restrictions: Impaired Restrictions Weight Bearing Restrictions Per Provider Order: No     Mobility Bed Mobility Overal bed mobility: Modified Independent                  Transfers Overall transfer level: Needs assistance Equipment used: None Transfers: Sit to/from Stand Sit to Stand: Supervision                  Balance Overall balance assessment: Needs assistance Sitting-balance support: Feet supported Sitting balance-Leahy Scale: Normal     Standing balance support: During functional activity, No upper extremity supported Standing balance-Leahy Scale: Good                             ADL either performed or assessed with clinical judgement   ADL Overall ADL's : Needs  assistance/impaired                                       General ADL Comments: SBA for ADL t/f ~150 ft, PRN use of railing for UE support. SUPERVISION standing grooming tasks     Vision         Perception         Praxis         Pertinent Vitals/Pain Pain Assessment Pain Assessment: No/denies pain     Extremity/Trunk Assessment Upper Extremity Assessment Upper Extremity Assessment: Overall WFL for tasks assessed   Lower Extremity Assessment Lower Extremity Assessment: Generalized weakness       Communication Communication Communication: No apparent difficulties   Cognition Arousal: Alert Behavior During Therapy: WFL for tasks assessed/performed Cognition: No family/caregiver present to determine baseline                               Following commands: Intact       Cueing  General Comments   Cueing Techniques: Verbal cues      Exercises     Shoulder Instructions      Home Living Family/patient expects to be discharged to:: Private residence Living Arrangements: Spouse/significant other Available Help at Discharge: Family Type of Home: House Home Access: Stairs  to enter Entrance Stairs-Number of Steps: 2-3   Home Layout: One level                          Prior Functioning/Environment Prior Level of Function : Independent/Modified Independent;Patient poor historian/Family not available             Mobility Comments: no AD      OT Problem List: Impaired balance (sitting and/or standing);Decreased safety awareness   OT Treatment/Interventions:        OT Goals(Current goals can be found in the care plan section)   Acute Rehab OT Goals Patient Stated Goal: go home OT Goal Formulation: With patient Time For Goal Achievement: 03/22/24 Potential to Achieve Goals: Good   OT Frequency:       Co-evaluation              AM-PAC OT 6 Clicks Daily Activity     Outcome Measure Help from  another person eating meals?: None Help from another person taking care of personal grooming?: None Help from another person toileting, which includes using toliet, bedpan, or urinal?: None Help from another person bathing (including washing, rinsing, drying)?: A Little Help from another person to put on and taking off regular upper body clothing?: None Help from another person to put on and taking off regular lower body clothing?: None 6 Click Score: 23   End of Session    Activity Tolerance: Patient tolerated treatment well Patient left: in bed;with call bell/phone within reach;with bed alarm set;with nursing/sitter in room  OT Visit Diagnosis: Other abnormalities of gait and mobility (R26.89);Muscle weakness (generalized) (M62.81)                Time: 8947-8891 OT Time Calculation (min): 16 min Charges:  OT General Charges $OT Visit: 1 Visit OT Evaluation $OT Eval Low Complexity: 1 Low OT Treatments $Self Care/Home Management : 8-22 mins  Jim Dodson, M.S. OTR/L  03/22/24, 1:22 PM  ascom [phone]

## 2024-03-22 NOTE — Care Management Important Message (Signed)
 Important Message  Patient Details  Name: Jim Dodson MRN: [id number] Date of Birth: 1941-06-24   Important Message Given:  Yes - Medicare IM     Jim Dodson 03/22/2024, 12:35 PM

## 2024-03-22 NOTE — Progress Notes (Signed)
 [facility] CARDIOLOGY PROGRESS NOTE       Patient ID: Jim Dodson MRN: [id number] DOB/AGE: 1941-07-30 83 y.o.  Admit date: 03/20/2024 Referring Physician Dr. Barbarann Primary Physician Onita Rush, MD Primary Cardiologist Dr. Florencio Reason for Consultation AoCHF  HPI: Jim Dodson is a 83 y.o. male  with a past medical history of chronic HFrEF (35% - 11/2023), ischemic cardiomyopathy, history of myocardial infarction, coronary artery disease s/p stent, hypertension, hyperlipidemia, paroxysmal atrial fibrillation, orthostatic hypotension, dementia who presented to the ED on 03/20/2024 for worsening shortness of breath.  Patient denies any chest pain, palpitations or lightheadedness.  Patient is also followed with [facility] failure clinic most recently seen on 02/08/2024, patient's volume status was stable at that time.  Cardiology was consulted for further evaluation acute on chronic HFrEF.  Interval history: - Patient seen and examined this morning, sitting upright in bedside chair with sitter present. - Denies any significant shortness of breath or lower extremity edema. - BP and heart rate stable, no events noted on telemetry.  Review of systems complete and found to be negative unless listed above    Past Medical History:  Diagnosis Date   Actinic keratosis    Aortic atherosclerosis (HCC)    Arthritis    Coronary artery disease    COVID-19    DDD (degenerative disc disease), lumbar    with associated multilevel impingement   Diastolic heart failure (HCC)    Diverticulosis    GERD (gastroesophageal reflux disease)    Headache    History of hiatal hernia    History of kidney stones    HLD (hyperlipidemia)    Hypertension    IDA (iron deficiency anemia)    Lumbar spondylolysis    Myelolipoma of right adrenal gland    Paroxysmal atrial fibrillation (HCC)    Prostate cancer (HCC)    Seborrheic dermatitis    Shortness of breath dyspnea    Subendocardial  myocardial infarction [facility]) 07/2009   PCI with DES x 1 placed to LAD    Past Surgical History:  Procedure Laterality Date   CARDIAC CATHETERIZATION     2009, stent placed   CARDIOVERSION N/A 03/13/2015   Procedure: CARDIOVERSION;  Surgeon: Oneil JAYSON Parchment, MD;  Location: [facility] ENDOSCOPY;  Service: Cardiovascular;  Laterality: N/A;   COLONOSCOPY WITH PROPOFOL  N/A 07/19/2015   Procedure: COLONOSCOPY WITH PROPOFOL ;  Surgeon: Lamar ONEIDA Holmes, MD;  Location: [facility] ENDOSCOPY;  Service: Endoscopy;  Laterality: N/A;   COLONOSCOPY WITH PROPOFOL  N/A 01/27/2019   Procedure: COLONOSCOPY WITH PROPOFOL ;  Surgeon: Holmes Lamar ONEIDA, MD;  Location: [facility] ENDOSCOPY;  Service: Endoscopy;  Laterality: N/A;   cryo surgery of prostate     CYSTOSCOPY WITH BIOPSY N/A 11/19/2020   Procedure: CYSTOSCOPY WITH POSSIBLE BLADDER BIOPSY;  Surgeon: Twylla Glendia JAYSON, MD;  Location: [facility] ORS;  Service: Urology;  Laterality: N/A;   ESOPHAGOGASTRODUODENOSCOPY N/A 01/27/2019   Procedure: ESOPHAGOGASTRODUODENOSCOPY (EGD);  Surgeon: Holmes Lamar ONEIDA, MD;  Location: [facility] ENDOSCOPY;  Service: Endoscopy;  Laterality: N/A;   ESOPHAGOGASTRODUODENOSCOPY (EGD) WITH PROPOFOL  N/A 07/19/2015   Procedure: ESOPHAGOGASTRODUODENOSCOPY (EGD) WITH PROPOFOL ;  Surgeon: Lamar ONEIDA Holmes, MD;  Location: [facility] ENDOSCOPY;  Service: Endoscopy;  Laterality: N/A;   JOINT REPLACEMENT     both knees   LAPAROTOMY N/A 03/08/2015   Procedure: EXPLORATORY LAPAROTOMY;  Surgeon: Deward Null III, MD;  Location: [facility] OR;  Service: General;  Laterality: N/A;   LAPAROTOMY N/A 03/09/2015   Procedure: EXPLORATORY LAPAROTOMY AND EVACUATION OF HEMATOMA;  Surgeon:  Deward Null III, MD;  Location: [facility] OR;  Service: General;  Laterality: N/A;   LYSIS OF ADHESION N/A 03/08/2015   Procedure: LYSIS OF ADHESION;  Surgeon: Deward Null III, MD;  Location: [facility] OR;  Service: General;  Laterality: N/A;   OMENTECTOMY N/A 03/08/2015   Procedure: OMENTECTOMY;  Surgeon: Deward Null III, MD;  Location: [facility] OR;   Service: General;  Laterality: N/A;   TEE WITHOUT CARDIOVERSION N/A 03/13/2015   Procedure: TRANSESOPHAGEAL ECHOCARDIOGRAM (TEE);  Surgeon: Oneil JAYSON Parchment, MD;  Location: [facility] ENDOSCOPY;  Service: Cardiovascular;  Laterality: N/A;   TOTAL KNEE ARTHROPLASTY Left 04/08/2016   Procedure: LEFT TOTAL KNEE ARTHROPLASTY;  Surgeon: Kay CHRISTELLA Cummins, MD;  Location: [facility] OR;  Service: Orthopedics;  Laterality: Left;   TOTAL KNEE ARTHROPLASTY Right 12/04/2019   Procedure: RIGHT TOTAL KNEE ARTHROPLASTY;  Surgeon: Cummins Kay CHRISTELLA, MD;  Location: [facility] OR;  Service: Orthopedics;  Laterality: Right;   UMBILICAL HERNIA REPAIR  03/08/2015   Procedure: UMBILICAL HERNIA REPAIR  ADULT;  Surgeon: Deward Null III, MD;  Location: [facility] OR;  Service: General;;   VENTRAL HERNIA REPAIR N/A 03/08/2015   Procedure:  VENTRAL HERNIA  REPAIR ADULT;  Surgeon: Deward Null III, MD;  Location: [facility] OR;  Service: General;  Laterality: N/A;    Medications Prior to Admission  Medication Sig Dispense Refill Last Dose/Taking   apixaban  (ELIQUIS ) 5 MG TABS tablet Take 5 mg by mouth 2 (two) times daily.   03/20/2024   Carboxymethylcellul-Glycerin (REFRESH OPTIVE PF OP) Place 1 drop into both eyes 2 (two) times daily as needed (dry eyes).   Unknown   Chlorpheniramine Maleate (ALLERGY RELIEF PO) Take 1 Dose by mouth daily. Gets [employer] brand   Unknown   Cholecalciferol  (VITAMIN D3) 5000 units TABS Take 5,000 Units by mouth daily.    Unknown   donepezil  (ARICEPT ) 10 MG tablet TAKE 1 TABLET BY MOUTH NIGHTLY (Patient taking differently: Take 10 mg by mouth 2 (two) times daily.) 30 tablet 11 03/20/2024   ENTRESTO  24-26 MG Take 1 tablet by mouth 2 (two) times daily.   03/20/2024   memantine  (NAMENDA ) 10 MG tablet Take 1 tablet (10 mg total) by mouth 2 (two) times daily. 60 tablet 11 03/20/2024   trospium  (SANCTURA ) 20 MG tablet TAKE ONE (1) TABLET BY MOUTH TWO TIMES PER DAY 120 tablet 5 03/20/2024   Acetaminophen  500 MG capsule Take 500 mg by mouth every 6 (six) hours as  needed for pain.   Unknown   ferrous sulfate  325 (65 FE) MG tablet Take 325 mg by mouth in the morning and at bedtime.  (Patient not taking: Reported on 03/20/2024)   Not Taking   Social History   Socioeconomic History   Marital status: Married    Spouse name: Probation officer   Number of children: Not on file   Years of education: Not on file   Highest education level: Not on file  Occupational History   Not on file  Tobacco Use   Smoking status: Former    Current packs/day: 0.00    Types: Cigarettes    Start date: 08/03/1968    Quit date: 08/03/1988    Years since quitting: 35.6    Passive exposure: Past   Smokeless tobacco: Never  Vaping Use   Vaping status: Never Used  Substance and Sexual Activity   Alcohol  use: No   Drug use: No   Sexual activity: Yes  Other Topics Concern   Not on file  Social History Narrative  Lives with wife   Left Handed   Drinks no caffeine   Social Drivers of Corporate investment banker Strain: Not on file  Food Insecurity: Not on file  Transportation Needs: Not on file  Physical Activity: Not on file  Stress: Not on file  Social Connections: Not on file  Intimate Partner Violence: Not on file    History reviewed. No pertinent family history.   Vitals:   03/21/24 2024 03/21/24 2300 03/22/24 0600 03/22/24 0826  BP: (!) 145/97 (!) 134/90  (!) 141/81  Pulse: 67 68  66  Resp: 18 16  17   Temp: 97.6 F (36.4 C) 97.6 F (36.4 C)  98.6 F (37 C)  TempSrc:  Oral    SpO2: 91% 99%  94%  Weight:   82.3 kg   Height:        PHYSICAL EXAM General: Well-appearing elderly male, well nourished, in no acute distress. HEENT: Normocephalic and atraumatic. Neck: No JVD.   Lungs: Normal respiratory effort on room air.  Bibasilar crackles. Heart: HRRR. Normal S1 and S2 without gallops or murmurs.  Abdomen: Non-distended appearing.  Msk: Normal strength and tone for age. Extremities: Warm and well perfused. No clubbing, cyanosis. + 1 pitting edema L > R.   Neuro: Alert and oriented X 3. Psych: Answers questions appropriately.   Labs: Basic Metabolic Panel: Recent Labs    03/21/24 0350 03/22/24 0506  NA 140 143  K 3.2* 3.3*  CL 104 102  CO2 27 29  GLUCOSE 133* 119*  BUN 18 23  CREATININE 1.27* 1.27*  CALCIUM 8.8* 8.9   Liver Function Tests: Recent Labs    03/21/24 0350  AST 22  ALT 21  ALKPHOS 53  BILITOT 1.1  PROT 6.2*  ALBUMIN  3.1*   No results for input(s): LIPASE, AMYLASE in the last 72 hours. CBC: Recent Labs    03/20/24 0957 03/21/24 0350  WBC 7.0 7.8  HGB 14.3 13.7  HCT 42.5 42.0  MCV 90.4 91.3  PLT 216 168   Cardiac Enzymes: Recent Labs    03/20/24 0957 03/20/24 1611  TROPONINIHS 23* 22*   BNP: Recent Labs    03/20/24 0957  BNP 1,729.3*   D-Dimer: No results for input(s): DDIMER in the last 72 hours. Hemoglobin A1C: No results for input(s): HGBA1C in the last 72 hours. Fasting Lipid Panel: No results for input(s): CHOL, HDL, LDLCALC, TRIG, CHOLHDL, LDLDIRECT in the last 72 hours. Thyroid  Function Tests: No results for input(s): TSH, T4TOTAL, T3FREE, THYROIDAB in the last 72 hours.  Invalid input(s): FREET3 Anemia Panel: No results for input(s): VITAMINB12, FOLATE, FERRITIN, TIBC, IRON, RETICCTPCT in the last 72 hours.   Radiology: ECHOCARDIOGRAM COMPLETE Result Date: 03/21/2024    ECHOCARDIOGRAM REPORT   Patient Name:   TU BAYLE Date of Exam: 03/21/2024 Medical Rec #:  [id number]       Height:       67.0 in Accession #:    [id number]      Weight:       170.0 lb Date of Birth:  [birth year]       BSA:          1.887 m Patient Age:    83 years        BP:           135/87 mmHg Patient Gender: M               HR:  66 bpm. Exam Location:  [facility] Procedure: 2D Echo, Cardiac Doppler, Color Doppler and Intracardiac            Opacification Agent (Both Spectral and Color Flow Doppler were            utilized during procedure). Indications:      CHF-Acute Systolic I50.21  History:         Patient has prior history of Echocardiogram examinations, most                  recent 03/13/2015.  Sonographer:     Ashley McNeely-Sloane Referring Phys:  [id number] [facility] PAUDEL Diagnosing Phys: Lonni Hanson MD  Sonographer Comments: Image acquisition challenging due to patient behavioral factors. and Image acquisition challenging due to uncooperative patient. IMPRESSIONS  1. Left ventricular ejection fraction, by estimation, is 30 to 35%. The left ventricle has moderate to severely decreased function. The left ventricle demonstrates global hypokinesis. The left ventricular internal cavity size was mildly dilated. There is mild asymmetric left ventricular hypertrophy of the septal segment. Left ventricular diastolic parameters are indeterminate.  2. Right ventricular systolic function is mildly reduced. The right ventricular size is mildly enlarged.  3. Left atrial size was mildly dilated.  4. The mitral valve is grossly normal. Mild to moderate mitral valve regurgitation. No evidence of mitral stenosis.  5. The aortic valve is tricuspid. There is mild thickening of the aortic valve. Aortic valve regurgitation is mild. Aortic valve sclerosis is present, with no evidence of aortic valve stenosis.  6. There is mild dilatation of the ascending aorta, measuring 41 mm. FINDINGS  Left Ventricle: Left ventricular ejection fraction, by estimation, is 30 to 35%. The left ventricle has moderate to severely decreased function. The left ventricle demonstrates global hypokinesis. Definity  contrast agent was given IV to delineate the left ventricular endocardial borders. The left ventricular internal cavity size was mildly dilated. There is mild asymmetric left ventricular hypertrophy of the septal segment. Left ventricular diastolic parameters are indeterminate. Right Ventricle: The right ventricular size is mildly enlarged. No increase in right ventricular wall thickness. Right  ventricular systolic function is mildly reduced. Left Atrium: Left atrial size was mildly dilated. Right Atrium: Right atrial size was normal in size. Pericardium: There is no evidence of pericardial effusion. Mitral Valve: The mitral valve is grossly normal. Mild to moderate mitral valve regurgitation. No evidence of mitral valve stenosis. MV peak gradient, 4.9 mmHg. The mean mitral valve gradient is 2.0 mmHg. Tricuspid Valve: The tricuspid valve is normal in structure. Tricuspid valve regurgitation is trivial. Aortic Valve: The aortic valve is tricuspid. There is mild thickening of the aortic valve. Aortic valve regurgitation is mild. Aortic valve sclerosis is present, with no evidence of aortic valve stenosis. Aortic valve mean gradient measures 5.0 mmHg. Aortic valve peak gradient measures 7.5 mmHg. Aortic valve area, by VTI measures 2.23 cm. Pulmonic Valve: The pulmonic valve was not well visualized. Pulmonic valve regurgitation is trivial. No evidence of pulmonic stenosis. Aorta: The aortic root is normal in size and structure. There is mild dilatation of the ascending aorta, measuring 41 mm. Pulmonary Artery: The pulmonary artery is of normal size. Venous: The inferior vena cava was not well visualized. IAS/Shunts: The interatrial septum was not well visualized.  LEFT VENTRICLE PLAX 2D LVIDd:         5.90 cm      Diastology LVIDs:         5.00 cm      LV e' medial:  6.74 cm/s LV PW:         0.90 cm      LV E/e' medial:  16.0 LV IVS:        1.20 cm      LV e' lateral:   9.25 cm/s LVOT diam:     2.53 cm      LV E/e' lateral: 11.7 LV SV:         52 LV SV Index:   28 LVOT Area:     5.04 cm  LV Volumes (MOD) LV vol d, MOD A2C: 154.0 ml LV vol d, MOD A4C: 166.0 ml LV vol s, MOD A2C: 86.6 ml LV vol s, MOD A4C: 124.0 ml LV SV MOD A2C:     67.4 ml LV SV MOD A4C:     166.0 ml LV SV MOD BP:      57.1 ml RIGHT VENTRICLE RV Basal diam:  4.30 cm RV Mid diam:    2.50 cm RV S prime:     5.33 cm/s TAPSE (M-mode): 1.6 cm  LEFT ATRIUM             Index        RIGHT ATRIUM           Index LA diam:        4.10 cm 2.17 cm/m   RA Area:     16.20 cm LA Vol (A2C):   90.8 ml 48.11 ml/m  RA Volume:   39.40 ml  20.88 ml/m LA Vol (A4C):   49.7 ml 26.33 ml/m LA Biplane Vol: 70.9 ml 37.57 ml/m  AORTIC VALVE                     PULMONIC VALVE AV Area (Vmax):    2.34 cm      PV Vmax:        0.63 m/s AV Area (Vmean):   2.10 cm      PV Vmean:       41.900 cm/s AV Area (VTI):     2.23 cm      PV VTI:         0.120 m AV Vmax:           137.00 cm/s   PV Peak grad:   1.6 mmHg AV Vmean:          108.000 cm/s  PV Mean grad:   1.0 mmHg AV VTI:            0.235 m       RVOT Peak grad: 0 mmHg AV Peak Grad:      7.5 mmHg AV Mean Grad:      5.0 mmHg LVOT Vmax:         63.70 cm/s LVOT Vmean:        45.000 cm/s LVOT VTI:          0.104 m LVOT/AV VTI ratio: 0.44  AORTA Ao Root diam: 3.25 cm Ao Asc diam:  4.10 cm MITRAL VALVE MV Area (PHT): 4.39 cm       SHUNTS MV Area VTI:   2.49 cm       Systemic VTI:  0.10 m MV Peak grad:  4.9 mmHg       Systemic Diam: 2.53 cm MV Mean grad:  2.0 mmHg       Pulmonic VTI:  0.046 m MV Vmax:       1.11 m/s MV Vmean:      71.6 cm/s MV  Decel Time: 173 msec MR Peak grad:    86.1 mmHg MR Mean grad:    52.0 mmHg MR Vmax:         464.00 cm/s MR Vmean:        337.0 cm/s MR PISA:         3.08 cm MR PISA Eff ROA: 26 mm MR PISA Radius:  0.70 cm MV E velocity: 108.00 cm/s Lonni Hanson MD Electronically signed by Lonni Hanson MD Signature Date/Time: 03/21/2024/2:31:59 PM    Final    DG Chest 2 View Result Date: 03/20/2024 CLINICAL DATA:  Shortness of breath with labored breathing. EXAM: CHEST - 2 VIEW COMPARISON:  Radiographs 11/30/2019 and 07/25/2015. FINDINGS: The heart size is at the upper limits of normal. There is aortic atherosclerosis with increased vascular congestion, fissural thickening and possible trace bilateral pleural effusions. No confluent airspace disease or pneumothorax. No acute osseous findings. There  are mild degenerative changes in the spine. IMPRESSION: Increased vascular congestion and possible trace bilateral pleural effusions suggesting mild congestive heart failure. Electronically Signed   By: Elsie Perone M.D.   On: 03/20/2024 16:13   VAS US  LOWER EXTREMITY VENOUS REFLUX Result Date: 02/28/2024  Lower Venous Reflux Study Patient Name:  ELONZO SOPP  Date of Exam:   02/28/2024 Medical Rec #: [id number]        Accession #:    [id number] Date of Birth: September 05, 1940        Patient Gender: M Patient Age:   64 years Exam Location:  [facility] Procedure:      VAS US  LOWER EXTREMITY VENOUS REFLUX Referring Phys: BEVERLEY JOURNEY --------------------------------------------------------------------------------  Indications: Swelling, and Pain.  Performing Technologist: Leafy Gibes RVS  Examination Guidelines: A complete evaluation includes B-mode imaging, spectral Doppler, color Doppler, and power Doppler as needed of all accessible portions of each vessel. Bilateral testing is considered an integral part of a complete examination. Limited examinations for reoccurring indications may be performed as noted. The reflux portion of the exam is performed with the patient in reverse Trendelenburg. Significant venous reflux is defined as >500 ms in the superficial venous system, and >1 second in the deep venous system.  +--------------+---------+------+-----------+------------+--------+ LEFT          Reflux NoRefluxReflux TimeDiameter cmsComments                         Yes                                  +--------------+---------+------+-----------+------------+--------+ CFV           no                                             +--------------+---------+------+-----------+------------+--------+ FV prox       no                                             +--------------+---------+------+-----------+------------+--------+ FV mid        no                                              +--------------+---------+------+-----------+------------+--------+  FV dist       no                                             +--------------+---------+------+-----------+------------+--------+ Popliteal     no                                             +--------------+---------+------+-----------+------------+--------+ GSV at [facility]              yes    1320 ms      1.08             +--------------+---------+------+-----------+------------+--------+ GSV prox thigh          yes    1489 ms      .83              +--------------+---------+------+-----------+------------+--------+ GSV mid thigh           yes    4482 ms      .77              +--------------+---------+------+-----------+------------+--------+ GSV dist thigh          yes    4526 ms      .78              +--------------+---------+------+-----------+------------+--------+ GSV at knee             yes    3191 ms      .72              +--------------+---------+------+-----------+------------+--------+ GSV prox calf           yes    3161 ms      .51              +--------------+---------+------+-----------+------------+--------+ SSV at [facility]    no                            .29              +--------------+---------+------+-----------+------------+--------+ Prox Calf               yes    3191 ms                       +--------------+---------+------+-----------+------------+--------+  Summary: Left: - No evidence of deep vein thrombosis seen in the left lower extremity, from the common femoral through the popliteal veins. - No evidence of superficial venous thrombosis in the left lower extremity. - There is no evidence of venous reflux seen in the left lower extremity. - Venous reflux is noted in the left sapheno-femoral junction. - Venous reflux is noted in the left greater saphenous vein.  *See table(s) above for measurements and observations. Electronically signed by Cordella Shawl MD on 02/28/2024 at 5:39:22 PM.    Final     ECHO 11/2023 MODERATE LEFT VENTRICULAR SYSTOLIC DYSFUNCTION WITH MILD LVH  ESTIMATED EF: 35%, CALC EF(2D): 34%  INDETERMINATE DIASTOLIC FUNCTION  NORMAL RIGHT VENTRICULAR SYSTOLIC FUNCTION  VALVULAR REGURGITATION: MILD AR, MILD MR, MILD PR, MILD TR  NO VALVULAR STENOSIS   TELEMETRY reviewed by me 03/22/2024: atrial fibrillation, rate 80s, PVCs  EKG reviewed by me: EKG with atrial flutter (rhythm not very  clear), rate 90s with nonspecific ST-T waves changes.  Data reviewed by me 03/22/2024: last 24h vitals tele labs imaging I/O hospitalist progress note  Principal Problem:   CHF (congestive heart failure) (HCC) Active Problems:   Atrial fibrillation with rapid ventricular response (HCC)   Chronic coronary artery disease   GERD (gastroesophageal reflux disease)   Alzheimer's disease (HCC)    ASSESSMENT AND PLAN:  DEKARI BURES is a 83 y.o. male  with a past medical history of chronic HFrEF (35% - 11/2023), ischemic cardiomyopathy, history of myocardial infarction, coronary artery disease s/p stent, hypertension, hyperlipidemia, paroxysmal atrial fibrillation, orthostatic hypotension, dementia who presented to the ED on 03/20/2024 for worsening shortness of breath.  Patient denies any chest pain, palpitations or lightheadedness.  Patient is also followed with [facility] heart failure clinic most recently seen on 02/08/2024, patient's volume status was stable at that time.  Cardiology was consulted for further evaluation acute on chronic HFrEF.  # Acute on chronic HFrEF (EF 35%) BNP elevated at 1700.  CXR with pulmonary vascular congestion and bilateral trace pleural effusions.  Prior echo from 11/2023 with EF of 35%.  Appears volume up.  Echo this admission with EF 30-35%, global hypokinesis, mild asymmetric LVH, mildly reduced RV function with mild RV enlargement, mild to moderate MR. -Monitor and replenish electrolytes for a goal K >4, Mag >2   -Continue IV Lasix  40 mg twice daily.  Closely monitor UOP and renal function. - Will change metoprolol  to succinate 100 mg twice daily. - Continue home Entresto  24-26 mg BID.  -Will resume home spironolactone  and empagliflozin when renal function stabilizes.  Consider tomorrow. -Will continue to optimize GDMT as BP and renal function allows.  # Coronary artery disease s/p stent # Hypertension # Hyperlipidemia Patient without chest pain. Troponins minimally elevated and flat 23 > 22. EKG with atrial flutter (rhythm not very clear), rate 90s with nonspecific ST-T waves changes.   -Continue metoprolol  and Entresto  as stated above. -Patient has documented statin allergy. -Minimally and flat troponin within setting of acute on chronic HFrEF is most consistent with demand/supply mismatch and not ACS.   # Paroxysmal atrial fibrillation Per tele in atrial fibrillation, rate 70-80s.  -Continue home Eliquis  5 mg twice daily for stroke risk reduction. - Switch metoprolol  to succinate 100 mg twice daily.  This patient's plan of care was discussed and created with Dr. Florencio and he is in agreement.  Signed: Danita Bloch, PA-C  03/22/2024, 9:57 AM [facility]

## 2024-03-22 NOTE — Progress Notes (Addendum)
  Progress Note   Patient: Jim Dodson FMW:[id number] DOB: 1940/08/13 DOA: 03/20/2024     2 DOS: the patient was seen and examined on 03/22/2024   Brief hospital course: 83yo with h/o CAD, Alzheimer's dementia, afib on Eliquis , and chronic HFrEF who presented on 8/18 with SOB. He is not on home Lasix  (takes hydrochlorothiazide ) and was found to be in acute heart failure. Started on Lasix .  Echocardiogram showed ejection fraction of 30 to 35%, diastolic function is indeterminate.   Principal Problem:   CHF (congestive heart failure) (HCC) Active Problems:   Paroxysmal atrial fibrillation with RVR (HCC)   Chronic coronary artery disease   Essential hypertension   GERD (gastroesophageal reflux disease)   Dementia with behavioral disturbance (HCC)   Alzheimer's disease (HCC)   Assessment and Plan:  Acute exacerbation of systolic congestive heart failure Worsening short of breath, has been treated with IV Lasix .  Patient has not been diuresing very well, I will add Aldactone  in addition to IV Lasix . Patient also on Entresto , beta-blocker.  Will continue to follow closely.   Paroxysmal atrial fibrillation with RVR. Eliquis , patient has increased heart rate today up to 134.  Added as needed IV metoprolol , also give a dose of digoxin  at 0.25 mg.  Continue to follow.  Hypokalemia. Acute kidney injury secondary to diuretics. Repleted, recheck levels tomorrow Function slowly worsened since yesterday, but stable.  Continue to follow   HTN Continue Entresto , metoprolol    GERD Continue Protonix    Alzheimer's disease Delirium precautions Continue donepezil , memantine    BPH Continue trospium  (fesoterodine  per formulary)   Deconditioning PT/OT consulted Family prefers home therapy if appropriate      Subjective:  Still has significant short of breath today, nonproductive cough.  Physical Exam: Vitals:   03/21/24 2300 03/22/24 0600 03/22/24 0826 03/22/24 1156  BP: (!)  134/90  (!) 141/81 (!) 126/95  Pulse: 68  66 88  Resp: 16  17   Temp: 97.6 F (36.4 C)  98.6 F (37 C) 97.9 F (36.6 C)  TempSrc: Oral     SpO2: 99%  94% 99%  Weight:  82.3 kg    Height:       General exam: Appears calm and comfortable, tachypnea Respiratory system: Tachypnea, crackles in the bases. Respiratory effort normal. Cardiovascular system: Irregularly irregular and tachycardic. No JVD, murmurs, rubs, gallops or clicks.  Gastrointestinal system: Abdomen is nondistended, soft and nontender. No organomegaly or masses felt. Normal bowel sounds heard. Central nervous system: Alert and oriented x2. No focal neurological deficits. Extremities: 2+ leg edema Skin: No rashes, lesions or ulcers Psychiatry: Judgement and insight appear normal. Mood & affect appropriate.    Data Reviewed:  Lab results reviewed.  Family Communication: Wife and daughter-in-law updated at bedside  Disposition: Status is: Inpatient Remains inpatient appropriate because: Severity of disease, IV treatment     Time spent: 55 minutes  Author: Murvin Mana, MD 03/22/2024 3:27 PM  For on call review [email or web address].

## 2024-03-22 NOTE — Evaluation (Signed)
 Physical Therapy Evaluation Patient Details Name: Jim Dodson MRN: [id number] DOB: [birth year] Today's Date: 03/22/2024  History of Present Illness  Patient is a 83 year old male with shortness of breath, acute CHF exacerbation. PMH: dementia, CAD, chronic HFrEF  Clinical Impression  Patient is agreeable to PT evaluation and cooperative throughout. He reports he lives with his spouse and is ambulatory without assistive device at baseline.  Today the patient was up in the chair on arrival to room. He was able to stand without hands on assistance. He walked in the hallway with hand held assistance with no significant dyspnea noted. Heart rate in the 70's after walking. Patient may be close to his baseline level of independence. PT will follow while in the hospital to maximize independence and decrease caregiver burden. No anticipated PT needs after this hospital stay.       If plan is discharge home, recommend the following: Supervision due to cognitive status;Assist for transportation;Help with stairs or ramp for entrance   Can travel by private vehicle        Equipment Recommendations None recommended by PT  Recommendations for Other Services       Functional Status Assessment Patient has had a recent decline in their functional status and demonstrates the ability to make significant improvements in function in a reasonable and predictable amount of time.     Precautions / Restrictions Precautions Precautions: Fall Recall of Precautions/Restrictions: Impaired Restrictions Weight Bearing Restrictions Per Provider Order: No      Mobility  Bed Mobility               General bed mobility comments: not assessed as patient sitting up on arrival and post session    Transfers Overall transfer level: Needs assistance Equipment used: None Transfers: Sit to/from Stand Sit to Stand: Supervision           General transfer comment: no hands on assistance required     Ambulation/Gait Ambulation/Gait assistance: Contact guard assist Gait Distance (Feet): 160 Feet Assistive device: 1 person hand held assist Gait Pattern/deviations: Step-through pattern Gait velocity: decreased     General Gait Details: heart rate in the 70's after walking. occasional veering with improvement with hand held assistance. no significant dyspnea noted  Stairs            Wheelchair Mobility     Tilt Bed    Modified Rankin (Stroke Patients Only)       Balance Overall balance assessment: Needs assistance Sitting-balance support: Feet supported Sitting balance-Leahy Scale: Good     Standing balance support: No upper extremity supported Standing balance-Leahy Scale: Fair                               Pertinent Vitals/Pain Pain Assessment Pain Assessment: No/denies pain    Home Living Family/patient expects to be discharged to:: Private residence Living Arrangements: Spouse/significant other Available Help at Discharge: Family Type of Home: House Home Access: Stairs to enter   Secretary/administrator of Steps: 2-3   Home Layout: One level   Additional Comments: information is per patient report    Prior Function Prior Level of Function : Independent/Modified Independent;Patient poor historian/Family not available             Mobility Comments: patient reports he is independent with mobility without device ADLs Comments: independent per patient report     Extremity/Trunk Assessment   Upper Extremity Assessment Upper Extremity Assessment:  Overall [facility] for tasks assessed    Lower Extremity Assessment Lower Extremity Assessment: Generalized weakness       Communication   Communication Communication: No apparent difficulties    Cognition Arousal: Alert Behavior During Therapy: WFL for tasks assessed/performed   PT - Cognitive impairments: History of cognitive impairments, No family/caregiver present to determine  baseline                       PT - Cognition Comments: patient is cooerative throughout session. some confusion about recent events, situation, etc. patient has 1:1 sitter in the room Following commands: Intact       Cueing Cueing Techniques: Verbal cues     General Comments General comments (skin integrity, edema, etc.): patient requesting to walk more while he is in the hospital    Exercises     Assessment/Plan    PT Assessment Patient needs continued PT services  PT Problem List Decreased strength;Decreased activity tolerance;Decreased balance;Decreased mobility;Decreased safety awareness       PT Treatment Interventions DME instruction;Stair training;Gait training;Functional mobility training;Therapeutic exercise;Therapeutic activities;Balance training;Neuromuscular re-education;Cognitive remediation;Patient/family education    PT Goals (Current goals can be found in the Care Plan section)  Acute Rehab PT Goals Patient Stated Goal: home PT Goal Formulation: With patient Time For Goal Achievement: 04/05/24 Potential to Achieve Goals: Good    Frequency Min 1X/week     Co-evaluation               AM-PAC PT 6 Clicks Mobility  Outcome Measure Help needed turning from your back to your side while in a flat bed without using bedrails?: None Help needed moving from lying on your back to sitting on the side of a flat bed without using bedrails?: None Help needed moving to and from a bed to a chair (including a wheelchair)?: A Little Help needed standing up from a chair using your arms (e.g., wheelchair or bedside chair)?: A Little Help needed to walk in hospital room?: A Little Help needed climbing 3-5 steps with a railing? : A Little 6 Click Score: 20    End of Session   Activity Tolerance: Patient tolerated treatment well Patient left: in chair;with nursing/sitter in room Nurse Communication: Mobility status PT Visit Diagnosis: Muscle weakness  (generalized) (M62.81);Unsteadiness on feet (R26.81)    Time: 9174-9165 PT Time Calculation (min) (ACUTE ONLY): 9 min   Charges:   PT Evaluation $PT Eval Low Complexity: 1 Low   PT General Charges $$ ACUTE PT VISIT: 1 Visit         Randine Essex, PT, MPT   Randine LULLA Essex 03/22/2024, 10:07 AM

## 2024-03-23 DIAGNOSIS — F0280 Dementia in other diseases classified elsewhere without behavioral disturbance: Secondary | ICD-10-CM | POA: Diagnosis not present

## 2024-03-23 DIAGNOSIS — I5023 Acute on chronic systolic (congestive) heart failure: Secondary | ICD-10-CM | POA: Diagnosis not present

## 2024-03-23 DIAGNOSIS — I480 Paroxysmal atrial fibrillation: Secondary | ICD-10-CM | POA: Diagnosis not present

## 2024-03-23 DIAGNOSIS — G309 Alzheimer's disease, unspecified: Secondary | ICD-10-CM | POA: Diagnosis not present

## 2024-03-23 LAB — BASIC METABOLIC PANEL WITH GFR
Anion gap: 9 (ref 5–15)
BUN: 20 mg/dL (ref 8–23)
CO2: 28 mmol/L (ref 22–32)
Calcium: 8.6 mg/dL — ABNORMAL LOW (ref 8.9–10.3)
Chloride: 101 mmol/L (ref 98–111)
Creatinine, Ser: 1.01 mg/dL (ref 0.61–1.24)
GFR, Estimated: >60 mL/min (ref >60)
Glucose, Bld: 103 mg/dL — ABNORMAL HIGH (ref 70–99)
Potassium: 3.6 mmol/L (ref 3.5–5.1)
Sodium: 138 mmol/L (ref 135–145)

## 2024-03-23 LAB — MAGNESIUM: Magnesium: 1.9 mg/dL (ref 1.7–2.4)

## 2024-03-23 MED ORDER — DIGOXIN 0.25 MG/ML IJ SOLN
0.2500 mg | Freq: Once | INTRAMUSCULAR | Status: AC
  Administered 2024-03-23: 0.25 mg via INTRAVENOUS
  Filled 2024-03-23: qty 2

## 2024-03-23 MED ORDER — POTASSIUM CHLORIDE CRYS ER 20 MEQ PO TBCR
60.0000 meq | EXTENDED_RELEASE_TABLET | Freq: Once | ORAL | Status: AC
  Administered 2024-03-23: 60 meq via ORAL
  Filled 2024-03-23: qty 3

## 2024-03-23 MED ORDER — DIGOXIN 0.25 MG/ML IJ SOLN
0.1250 mg | Freq: Every day | INTRAMUSCULAR | Status: DC
  Administered 2024-03-24 – 2024-03-25 (×2): 0.125 mg via INTRAVENOUS
  Filled 2024-03-23 (×2): qty 2

## 2024-03-23 MED ORDER — DILTIAZEM HCL 30 MG PO TABS
30.0000 mg | ORAL_TABLET | Freq: Three times a day (TID) | ORAL | Status: DC
  Administered 2024-03-23 (×2): 30 mg via ORAL
  Filled 2024-03-23 (×2): qty 1

## 2024-03-23 MED ORDER — DILTIAZEM HCL ER COATED BEADS 120 MG PO CP24: 120.0000 mg | ORAL_CAPSULE | Freq: Every day | ORAL | Status: DC

## 2024-03-23 MED ORDER — DIGOXIN 0.25 MG/ML IJ SOLN
0.5000 mg | Freq: Once | INTRAMUSCULAR | Status: AC
  Administered 2024-03-23: 0.5 mg via INTRAVENOUS
  Filled 2024-03-23: qty 2

## 2024-03-23 MED ORDER — MAGNESIUM SULFATE 2 GM/50ML IV SOLN
2.0000 g | Freq: Once | INTRAVENOUS | Status: AC
  Administered 2024-03-23: 2 g via INTRAVENOUS
  Filled 2024-03-23: qty 50

## 2024-03-23 NOTE — Progress Notes (Signed)
 Patient ID: Jim Dodson, male   DOB: [birth year], 83 y.o.   MRN: [id number] [facility] Card[facility]    SUBJECTIVE: Patient states he feels better less shortness of breath denies chest pain no edema denies any palpitation wants to go home   Vitals:   03/22/24 2004 03/22/24 2300 03/23/24 0318 03/23/24 0500  BP: 128/86 130/84 (!) 141/99   Pulse: 88 80 69   Resp: 18  17   Temp: 98.2 F (36.8 C) 98 F (36.7 C) 98.3 F (36.8 C)   TempSrc: Oral Oral    SpO2: 98% 98% 94%   Weight:    86.8 kg  Height:         Intake/Output Summary (Last 24 hours) at 03/23/2024 0801 Last data filed at 03/23/2024 0300 Gross per 24 hour  Intake 120 ml  Output 1 ml  Net 119 ml      PHYSICAL EXAM  General: Well developed, well nourished, in no acute distress HEENT:  Normocephalic and atramatic Neck:  No JVD.  Lungs: Clear bilaterally to auscultation and percussion. Heart: Irregular irregular. Normal S1 and S2 without gallops or murmurs.  Abdomen: Bowel sounds are positive, abdomen soft and non-tender  Msk:  Back normal, normal gait. Normal strength and tone for age. Extremities: No clubbing, cyanosis or edema.   Neuro: Alert and oriented X 3. Psych:  Good affect, responds appropriately   LABS: Basic Metabolic Panel: Recent Labs    03/22/24 0506 03/23/24 0451  NA 143 138  K 3.3* 3.6  CL 102 101  CO2 29 28  GLUCOSE 119* 103*  BUN 23 20  CREATININE 1.27* 1.01  CALCIUM 8.9 8.6*  MG  --  1.9   Liver Function Tests: Recent Labs    03/21/24 0350  AST 22  ALT 21  ALKPHOS 53  BILITOT 1.1  PROT 6.2*  ALBUMIN  3.1*   No results for input(s): LIPASE, AMYLASE in the last 72 hours. CBC: Recent Labs    03/20/24 0957 03/21/24 0350  WBC 7.0 7.8  HGB 14.3 13.7  HCT 42.5 42.0  MCV 90.4 91.3  PLT 216 168   Cardiac Enzymes: No results for input(s): CKTOTAL, CKMB, CKMBINDEX, TROPONINI in the last 72 hours. BNP: Invalid input(s): POCBNP D-Dimer: No results for input(s):  DDIMER in the last 72 hours. Hemoglobin A1C: No results for input(s): HGBA1C in the last 72 hours. Fasting Lipid Panel: No results for input(s): CHOL, HDL, LDLCALC, TRIG, CHOLHDL, LDLDIRECT in the last 72 hours. Thyroid  Function Tests: No results for input(s): TSH, T4TOTAL, T3FREE, THYROIDAB in the last 72 hours.  Invalid input(s): FREET3 Anemia Panel: No results for input(s): VITAMINB12, FOLATE, FERRITIN, TIBC, IRON, RETICCTPCT in the last 72 hours.  ECHOCARDIOGRAM COMPLETE Result Date: 03/21/2024    ECHOCARDIOGRAM REPORT   Patient Name:   Jim Dodson Date of Exam: 03/21/2024 Medical Rec #:  [id number]       Height:       67.0 in Accession #:    [id number]      Weight:       170.0 lb Date of Birth:  07-23-1941       BSA:          1.887 m Patient Age:    83 years        BP:           135/87 mmHg Patient Gender: M               HR:  66 bpm. Exam Location:  [facility] Procedure: 2D Echo, Cardiac Doppler, Color Doppler and Intracardiac            Opacification Agent (Both Spectral and Color Flow Doppler were            utilized during procedure). Indications:     CHF-Acute Systolic I50.21  History:         Patient has prior history of Echocardiogram examinations, most                  recent 03/13/2015.  Sonographer:     Ashley McNeely-Sloane Referring Phys:  [id number] [facility] PAUDEL Diagnosing Phys: Lonni Hanson MD  Sonographer Comments: Image acquisition challenging due to patient behavioral factors. and Image acquisition challenging due to uncooperative patient. IMPRESSIONS  1. Left ventricular ejection fraction, by estimation, is 30 to 35%. The left ventricle has moderate to severely decreased function. The left ventricle demonstrates global hypokinesis. The left ventricular internal cavity size was mildly dilated. There is mild asymmetric left ventricular hypertrophy of the septal segment. Left ventricular diastolic parameters are indeterminate.  2. Right  ventricular systolic function is mildly reduced. The right ventricular size is mildly enlarged.  3. Left atrial size was mildly dilated.  4. The mitral valve is grossly normal. Mild to moderate mitral valve regurgitation. No evidence of mitral stenosis.  5. The aortic valve is tricuspid. There is mild thickening of the aortic valve. Aortic valve regurgitation is mild. Aortic valve sclerosis is present, with no evidence of aortic valve stenosis.  6. There is mild dilatation of the ascending aorta, measuring 41 mm. FINDINGS  Left Ventricle: Left ventricular ejection fraction, by estimation, is 30 to 35%. The left ventricle has moderate to severely decreased function. The left ventricle demonstrates global hypokinesis. Definity  contrast agent was given IV to delineate the left ventricular endocardial borders. The left ventricular internal cavity size was mildly dilated. There is mild asymmetric left ventricular hypertrophy of the septal segment. Left ventricular diastolic parameters are indeterminate. Right Ventricle: The right ventricular size is mildly enlarged. No increase in right ventricular wall thickness. Right ventricular systolic function is mildly reduced. Left Atrium: Left atrial size was mildly dilated. Right Atrium: Right atrial size was normal in size. Pericardium: There is no evidence of pericardial effusion. Mitral Valve: The mitral valve is grossly normal. Mild to moderate mitral valve regurgitation. No evidence of mitral valve stenosis. MV peak gradient, 4.9 mmHg. The mean mitral valve gradient is 2.0 mmHg. Tricuspid Valve: The tricuspid valve is normal in structure. Tricuspid valve regurgitation is trivial. Aortic Valve: The aortic valve is tricuspid. There is mild thickening of the aortic valve. Aortic valve regurgitation is mild. Aortic valve sclerosis is present, with no evidence of aortic valve stenosis. Aortic valve mean gradient measures 5.0 mmHg. Aortic valve peak gradient measures 7.5 mmHg.  Aortic valve area, by VTI measures 2.23 cm. Pulmonic Valve: The pulmonic valve was not well visualized. Pulmonic valve regurgitation is trivial. No evidence of pulmonic stenosis. Aorta: The aortic root is normal in size and structure. There is mild dilatation of the ascending aorta, measuring 41 mm. Pulmonary Artery: The pulmonary artery is of normal size. Venous: The inferior vena cava was not well visualized. IAS/Shunts: The interatrial septum was not well visualized.  LEFT VENTRICLE PLAX 2D LVIDd:         5.90 cm      Diastology LVIDs:         5.00 cm      LV e' medial:  6.74 cm/s LV PW:         0.90 cm      LV E/e' medial:  16.0 LV IVS:        1.20 cm      LV e' lateral:   9.25 cm/s LVOT diam:     2.53 cm      LV E/e' lateral: 11.7 LV SV:         52 LV SV Index:   28 LVOT Area:     5.04 cm  LV Volumes (MOD) LV vol d, MOD A2C: 154.0 ml LV vol d, MOD A4C: 166.0 ml LV vol s, MOD A2C: 86.6 ml LV vol s, MOD A4C: 124.0 ml LV SV MOD A2C:     67.4 ml LV SV MOD A4C:     166.0 ml LV SV MOD BP:      57.1 ml RIGHT VENTRICLE RV Basal diam:  4.30 cm RV Mid diam:    2.50 cm RV S prime:     5.33 cm/s TAPSE (M-mode): 1.6 cm LEFT ATRIUM             Index        RIGHT ATRIUM           Index LA diam:        4.10 cm 2.17 cm/m   RA Area:     16.20 cm LA Vol (A2C):   90.8 ml 48.11 ml/m  RA Volume:   39.40 ml  20.88 ml/m LA Vol (A4C):   49.7 ml 26.33 ml/m LA Biplane Vol: 70.9 ml 37.57 ml/m  AORTIC VALVE                     PULMONIC VALVE AV Area (Vmax):    2.34 cm      PV Vmax:        0.63 m/s AV Area (Vmean):   2.10 cm      PV Vmean:       41.900 cm/s AV Area (VTI):     2.23 cm      PV VTI:         0.120 m AV Vmax:           137.00 cm/s   PV Peak grad:   1.6 mmHg AV Vmean:          108.000 cm/s  PV Mean grad:   1.0 mmHg AV VTI:            0.235 m       RVOT Peak grad: 0 mmHg AV Peak Grad:      7.5 mmHg AV Mean Grad:      5.0 mmHg LVOT Vmax:         63.70 cm/s LVOT Vmean:        45.000 cm/s LVOT VTI:          0.104 m  LVOT/AV VTI ratio: 0.44  AORTA Ao Root diam: 3.25 cm Ao Asc diam:  4.10 cm MITRAL VALVE MV Area (PHT): 4.39 cm       SHUNTS MV Area VTI:   2.49 cm       Systemic VTI:  0.10 m MV Peak grad:  4.9 mmHg       Systemic Diam: 2.53 cm MV Mean grad:  2.0 mmHg       Pulmonic VTI:  0.046 m MV Vmax:       1.11 m/s MV Vmean:      71.6 cm/s MV  Decel Time: 173 msec MR Peak grad:    86.1 mmHg MR Mean grad:    52.0 mmHg MR Vmax:         464.00 cm/s MR Vmean:        337.0 cm/s MR PISA:         3.08 cm MR PISA Eff ROA: 26 mm MR PISA Radius:  0.70 cm MV E velocity: 108.00 cm/s Lonni Hanson MD Electronically signed by Lonni Hanson MD Signature Date/Time: 03/21/2024/2:31:59 PM    Final      Echo depressed left ventricular function EF of 30 to 35%  TELEMETRY: Atrial fibrillation rate of 75:  ASSESSMENT AND PLAN:  Principal Problem:   CHF (congestive heart failure) (HCC) Active Problems:   Paroxysmal atrial fibrillation with RVR (HCC)   Chronic coronary artery disease   Essential hypertension   GERD (gastroesophageal reflux disease)   Dementia with behavioral disturbance (HCC)   Alzheimer's disease (HCC)   Acute on chronic systolic CHF (congestive heart failure) (HCC)    Plan Atrial fibrillation with RVR continue rate control management Ischemic cardiomyopathy EF around 35% continue GDMT Lasix  metoprolol  Entresto  spironolactone  Jardiance Coronary disease status post PCI and stent no recent anginal symptoms With flat troponins no evidence of non-STEMI or acute coronary syndrome Continue anticoagulation with Eliquis  for stroke prevention Mild dementia continue supportive and reassurance care   Cara JONETTA Lovelace, MD 03/23/2024 8:01 AM

## 2024-03-23 NOTE — Progress Notes (Addendum)
  Progress Note   Patient: Jim Dodson FMW:[id number] DOB: 12/25/1940 DOA: 03/20/2024     3 DOS: the patient was seen and examined on 03/23/2024   Brief hospital course: 83yo with h/o CAD, Alzheimer's dementia, afib on Eliquis , and chronic HFrEF who presented on 8/18 with SOB. He is not on home Lasix  (takes hydrochlorothiazide ) and was found to be in acute heart failure. Started on Lasix .  Echocardiogram showed ejection fraction of 30 to 35%, diastolic function is indeterminate.   Principal Problem:   CHF (congestive heart failure) (HCC) Active Problems:   Paroxysmal atrial fibrillation with RVR (HCC)   Chronic coronary artery disease   Essential hypertension   GERD (gastroesophageal reflux disease)   Dementia with behavioral disturbance (HCC)   Alzheimer's disease (HCC)   Acute on chronic systolic CHF (congestive heart failure) (HCC)   Assessment and Plan: Acute exacerbation of systolic congestive heart failure Worsening short of breath, has been treated with IV Lasix .  Patient has not been diuresing very well, I will add Aldactone  in addition to IV Lasix  on 8/21. Patient also on Entresto , beta-blocker.   Looks like there is no urine output recorded by RN, but patient short of breath appeared to be better than yesterday.  Discussed with RN, need accurate recording of urine output.  Continue Lasix  and Aldactone .  Renal function appears to be improved.   Paroxysmal atrial fibrillation with RVR. Eliquis , patient has increased heart rate today up to 134.  Added as needed IV metoprolol , also give a dose of digoxin  at 0.25 mg on 8/20. Patient still has some tachycardia, will continue beta-blocker, also added a lower dose of short acting diltiazem .  May not needed at time of discharge.  Nonsustained ventricular tachycardia Patient had 18 beats of VT, asymptomatic, cardiology notified.   Hypokalemia. Acute kidney injury secondary to diuretics. Renal function and potassium all  normalized.  Magnesium  also normal.   HTN Continue Entresto , metoprolol    GERD Continue Protonix    Alzheimer's disease Delirium precautions Continue donepezil , memantine    BPH Continue trospium  (fesoterodine  per formulary)   Deconditioning PT/OT consulted Family prefers home therapy if appropriate       Subjective:  Patient still has short of breath with exertion, but appear to be much better than yesterday.  Physical Exam: Vitals:   03/22/24 2300 03/23/24 0318 03/23/24 0500 03/23/24 0945  BP: 130/84 (!) 141/99  (!) 131/98  Pulse: 80 69  (!) 127  Resp:  17    Temp: 98 F (36.7 C) 98.3 F (36.8 C)    TempSrc: Oral     SpO2: 98% 94%    Weight:   86.8 kg   Height:       General exam: Appears calm and comfortable  Respiratory system: Clear to auscultation. Respiratory effort normal. Cardiovascular system: S1 & S2 heard, RRR. No JVD, murmurs, rubs, gallops or clicks. No pedal edema. Gastrointestinal system: Abdomen is nondistended, soft and nontender. No organomegaly or masses felt. Normal bowel sounds heard. Central nervous system: Alert and oriented x2.  No focal neurological deficits. Extremities: Symmetric 5 x 5 power. Skin: No rashes, lesions or ulcers Psychiatry: Mood & affect appropriate.     Data Reviewed:  Lab results reviewed.  Family Communication: None  Disposition: Status is: Inpatient Remains inpatient appropriate because: Severity of disease, IV treatment     Time spent: 35 minutes  Author: Murvin Mana, MD 03/23/2024 12:26 PM  For on call review [email or web address].

## 2024-03-23 NOTE — Plan of Care (Signed)
  Problem: Activity: Goal: Capacity to carry out activities will improve Outcome: Progressing   Problem: Education: Goal: Knowledge of General Education information will improve Description: Including pain rating scale, medication(s)/side effects and non-pharmacologic comfort measures Outcome: Progressing   Problem: Nutrition: Goal: Adequate nutrition will be maintained Outcome: Progressing   Problem: Coping: Goal: Level of anxiety will decrease Outcome: Progressing   Problem: Elimination: Goal: Will not experience complications related to urinary retention Outcome: Progressing   Problem: Pain Managment: Goal: General experience of comfort will improve and/or be controlled Outcome: Progressing   Problem: Skin Integrity: Goal: Risk for impaired skin integrity will decrease Outcome: Progressing   Problem: Cardiac: Goal: Ability to achieve and maintain adequate cardiopulmonary perfusion will improve Outcome: Not Progressing

## 2024-03-23 NOTE — Progress Notes (Signed)
 Physical Therapy Treatment Patient Details Name: Jim Dodson MRN: [id number] DOB: 04/21/1941 Today's Date: 03/23/2024   History of Present Illness Patient is a 83 year old male with shortness of breath, acute CHF exacerbation. PMH: dementia, CAD, chronic HFrEF    PT Comments  Patient is agreeable to PT session. He ambulated in the hallway with no loss of balance without assistive device. Heart rate up to 140 with walking with no significant dyspnea noted. Anticipate the patient is nearing his baseline level of functional independence. No anticipate PT needs after this hospital stay.    If plan is discharge home, recommend the following: Supervision due to cognitive status;Assist for transportation;Help with stairs or ramp for entrance   Can travel by private vehicle        Equipment Recommendations  None recommended by PT    Recommendations for Other Services       Precautions / Restrictions Precautions Precautions: Fall Recall of Precautions/Restrictions: Impaired Restrictions Weight Bearing Restrictions Per Provider Order: No     Mobility  Bed Mobility Overal bed mobility: Modified Independent             General bed mobility comments: increased time    Transfers Overall transfer level: Needs assistance Equipment used: None Transfers: Sit to/from Stand Sit to Stand: Supervision                Ambulation/Gait Ambulation/Gait assistance: Contact guard assist, Supervision Gait Distance (Feet): 150 Feet Assistive device: None Gait Pattern/deviations: Step-through pattern Gait velocity: decreased     General Gait Details: slow but steady without loss of balance with ambulation. encouraged patient to monitor for sings of fatigue, need for rest breaks. no significant dyspnea with exertion, however heart rate up to 140 with walking   Stairs             Wheelchair Mobility     Tilt Bed    Modified Rankin (Stroke Patients Only)        Balance Overall balance assessment: Needs assistance Sitting-balance support: Feet supported Sitting balance-Leahy Scale: Normal     Standing balance support: During functional activity, No upper extremity supported Standing balance-Leahy Scale: Good Standing balance comment: patient able to manage brief to stand and urinate as well as pick up an object from the floor without loss of balance                            Communication Communication Communication: No apparent difficulties  Cognition Arousal: Alert Behavior During Therapy: WFL for tasks assessed/performed   PT - Cognitive impairments: History of cognitive impairments, No family/caregiver present to determine baseline                       PT - Cognition Comments: patient cooperative throughout session. he continues to have a 1:1 Recruitment consultant. he did perseverate on the telemetry box briefly at start of session but is easily redirected Following commands: Intact      Cueing Cueing Techniques: Verbal cues  Exercises      General Comments        Pertinent Vitals/Pain Pain Assessment Pain Assessment: No/denies pain    Home Living                          Prior Function            PT Goals (current goals can now be found  in the care plan section) Acute Rehab PT Goals Patient Stated Goal: to return home PT Goal Formulation: With patient Time For Goal Achievement: 04/05/24 Potential to Achieve Goals: Good Progress towards PT goals: Progressing toward goals    Frequency    Min 1X/week      PT Plan      Co-evaluation              AM-PAC PT 6 Clicks Mobility   Outcome Measure  Help needed turning from your back to your side while in a flat bed without using bedrails?: None Help needed moving from lying on your back to sitting on the side of a flat bed without using bedrails?: None Help needed moving to and from a bed to a chair (including a wheelchair)?: A  Little Help needed standing up from a chair using your arms (e.g., wheelchair or bedside chair)?: A Little Help needed to walk in hospital room?: A Little Help needed climbing 3-5 steps with a railing? : A Little 6 Click Score: 20    End of Session   Activity Tolerance: Patient tolerated treatment well Patient left: in bed;with call bell/phone within reach;with nursing/sitter in room Nurse Communication: Mobility status PT Visit Diagnosis: Muscle weakness (generalized) (M62.81);Unsteadiness on feet (R26.81)     Time: 8794-8785 PT Time Calculation (min) (ACUTE ONLY): 9 min  Charges:    $Therapeutic Activity: 8-22 mins PT General Charges $$ ACUTE PT VISIT: 1 Visit                     Randine Essex, PT, MPT    Randine LULLA Essex 03/23/2024, 1:08 PM

## 2024-03-24 ENCOUNTER — Telehealth (INDEPENDENT_AMBULATORY_CARE_PROVIDER_SITE_OTHER): Payer: Self-pay | Admitting: Nurse Practitioner

## 2024-03-24 ENCOUNTER — Encounter (INDEPENDENT_AMBULATORY_CARE_PROVIDER_SITE_OTHER): Admitting: Nurse Practitioner

## 2024-03-24 DIAGNOSIS — F0280 Dementia in other diseases classified elsewhere without behavioral disturbance: Secondary | ICD-10-CM | POA: Diagnosis not present

## 2024-03-24 DIAGNOSIS — I5023 Acute on chronic systolic (congestive) heart failure: Secondary | ICD-10-CM | POA: Diagnosis not present

## 2024-03-24 DIAGNOSIS — I480 Paroxysmal atrial fibrillation: Secondary | ICD-10-CM | POA: Diagnosis not present

## 2024-03-24 DIAGNOSIS — G309 Alzheimer's disease, unspecified: Secondary | ICD-10-CM | POA: Diagnosis not present

## 2024-03-24 LAB — BRAIN NATRIURETIC PEPTIDE: B Natriuretic Peptide: 611.3 pg/mL — ABNORMAL HIGH (ref 0.0–100.0)

## 2024-03-24 MED ORDER — METOLAZONE 5 MG PO TABS
5.0000 mg | ORAL_TABLET | Freq: Once | ORAL | Status: AC
  Administered 2024-03-24: 5 mg via ORAL
  Filled 2024-03-24: qty 1

## 2024-03-24 NOTE — Telephone Encounter (Signed)
 LVM for Ms. Bledsol to call back and R/S patient from cancelled appt (hospitalized) on 8.22.25.

## 2024-03-24 NOTE — Progress Notes (Signed)
 Progress Note   Patient: Jim Dodson FMW:[id number] DOB: 1940-12-06 DOA: 03/20/2024     4 DOS: the patient was seen and examined on 03/24/2024   Brief hospital course: 83yo with h/o CAD, Alzheimer's dementia, afib on Eliquis , and chronic HFrEF who presented on 8/18 with SOB. He is not on home Lasix  (takes hydrochlorothiazide ) and was found to be in acute heart failure. Started on Lasix .  Echocardiogram showed ejection fraction of 30 to 35%, diastolic function is indeterminate.   Principal Problem:   CHF (congestive heart failure) (HCC) Active Problems:   Paroxysmal atrial fibrillation with RVR (HCC)   Chronic coronary artery disease   Essential hypertension   GERD (gastroesophageal reflux disease)   Dementia with behavioral disturbance (HCC)   Alzheimer's disease (HCC)   Acute on chronic systolic CHF (congestive heart failure) (HCC)   Assessment and Plan: Acute exacerbation of systolic congestive heart failure Worsening short of breath, has been treated with IV Lasix .  Patient has not been diuresing very well, I will add Aldactone  in addition to IV Lasix  on 8/21. Patient also on Entresto , beta-blocker.   Looks like there is no urine output recorded by RN, but patient short of breath appeared to be better than yesterday.  Discussed with RN, need accurate recording of urine output.  Continued Lasix  and Aldactone .   Patient no longer has any shortness of breath today, BNP still elevated, but much improved.  Renal function has normalized.  I will add a dose of metolazone  today.  Probably can discharge home tomorrow    Paroxysmal atrial fibrillation with RVR. Eliquis , patient has increased heart rate today up to 134.  Added as needed IV metoprolol , also give a dose of digoxin  at 0.25 mg on 8/20. Heart rate much better controlled.  Cardiology has added digoxin , I will discontinue diltiazem  for now.   Nonsustained ventricular tachycardia Patient had 18 beats of VT, asymptomatic,  cardiology aware.  This is secondary to exacerbation of congestive heart failure, no additional treatment available.   Hypokalemia. Acute kidney injury secondary to diuretics. Renal function and potassium all normalized.  Magnesium  also normal.   HTN Continue Entresto , metoprolol    GERD Continue Protonix    Alzheimer's disease Delirium precautions Continue donepezil , memantine    BPH Continue trospium  (fesoterodine  per formulary)   Deconditioning PT/OT consulted Family prefers home therapy if appropriate   Overweight with BMI 29.25.     Subjective:  Patient feels much better today, no significant short of breath.  Physical Exam: Vitals:   03/24/24 0420 03/24/24 0500 03/24/24 0751 03/24/24 1114  BP: 138/88  (!) 143/89 106/72  Pulse: 66  67 67  Resp: (!) 29     Temp: 97.6 F (36.4 C)  97.7 F (36.5 C) 97.7 F (36.5 C)  TempSrc:   Axillary   SpO2: 100%  99% 98%  Weight:  84.7 kg    Height:       General exam: Appears calm and comfortable  Respiratory system: A few crackles in the left. Respiratory effort normal. Cardiovascular system: Irregular. No JVD, murmurs, rubs, gallops or clicks. No pedal edema. Gastrointestinal system: Abdomen is nondistended, soft and nontender. No organomegaly or masses felt. Normal bowel sounds heard. Central nervous system: Alert and oriented x2. No focal neurological deficits. Extremities: Symmetric 5 x 5 power. Skin: No rashes, lesions or ulcers Psychiatry: Mood & affect appropriate.    Data Reviewed:  Lab results reviewed again  Family Communication: Daughter in law updated at bedside  Disposition: Status is: Inpatient  Remains inpatient appropriate because: Severity of disease, IV treatment. Plan to discharge home with home health tomorrow.  Family wishes to avoid nursing home placement     Time spent: 50 minutes  Author: Murvin Mana, MD 03/24/2024 11:18 AM  For on call review [email or web address].

## 2024-03-24 NOTE — Progress Notes (Signed)
 Patient ID: Jim Dodson, male   DOB: 01-05-1941, 83 y.o.   MRN: [id number] [facility]    SUBJECTIVE: Feeling much better heart rate much improved no worsening shortness of breath still frustrated about not being able to go home   Vitals:   03/24/24 0420 03/24/24 0500 03/24/24 0751 03/24/24 1114  BP: 138/88  (!) 143/89 106/72  Pulse: 66  67 67  Resp: (!) 29     Temp: 97.6 F (36.4 C)  97.7 F (36.5 C) 97.7 F (36.5 C)  TempSrc:   Axillary Axillary  SpO2: 100%  99% 98%  Weight:  84.7 kg    Height:         Intake/Output Summary (Last 24 hours) at 03/24/2024 1554 Last data filed at 03/24/2024 1445 Gross per 24 hour  Intake 1140 ml  Output 300 ml  Net 840 ml      PHYSICAL EXAM  General: Well developed, well nourished, in no acute distress HEENT:  Normocephalic and atramatic Neck:  No JVD.  Lungs: Clear bilaterally to auscultation and percussion. Heart: HRRR . Normal S1 and S2 without gallops or murmurs.  Abdomen: Bowel sounds are positive, abdomen soft and non-tender  Msk:  Back normal, normal gait. Normal strength and tone for age. Extremities: No clubbing, cyanosis or edema.   Neuro: Alert and oriented X 3. Psych:  Good affect, responds appropriately   LABS: Basic Metabolic Panel: Recent Labs    03/22/24 0506 03/23/24 0451  NA 143 138  K 3.3* 3.6  CL 102 101  CO2 29 28  GLUCOSE 119* 103*  BUN 23 20  CREATININE 1.27* 1.01  CALCIUM 8.9 8.6*  MG  --  1.9   Liver Function Tests: No results for input(s): AST, ALT, ALKPHOS, BILITOT, PROT, ALBUMIN  in the last 72 hours. No results for input(s): LIPASE, AMYLASE in the last 72 hours. CBC: No results for input(s): WBC, NEUTROABS, HGB, HCT, MCV, PLT in the last 72 hours. Cardiac Enzymes: No results for input(s): CKTOTAL, CKMB, CKMBINDEX, TROPONINI in the last 72 hours. BNP: Invalid input(s): POCBNP D-Dimer: No results for input(s): DDIMER in the last 72  hours. Hemoglobin A1C: No results for input(s): HGBA1C in the last 72 hours. Fasting Lipid Panel: No results for input(s): CHOL, HDL, LDLCALC, TRIG, CHOLHDL, LDLDIRECT in the last 72 hours. Thyroid  Function Tests: No results for input(s): TSH, T4TOTAL, T3FREE, THYROIDAB in the last 72 hours.  Invalid input(s): FREET3 Anemia Panel: No results for input(s): VITAMINB12, FOLATE, FERRITIN, TIBC, IRON, RETICCTPCT in the last 72 hours.  No results found.   Echo moderate depressed left ventricular function EF about 30 to 35%  TELEMETRY: Atrial fibrillation rate of 89 nonspecific findings:  ASSESSMENT AND PLAN:  Principal Problem:   CHF (congestive heart failure) (HCC) Active Problems:   Paroxysmal atrial fibrillation with RVR (HCC)   Chronic coronary artery disease   Essential hypertension   GERD (gastroesophageal reflux disease)   Dementia with behavioral disturbance (HCC)   Alzheimer's disease (HCC)   Acute on chronic systolic CHF (congestive heart failure) (HCC)    Plan Continue telemetry follow-up rate control Continue anticoagulation for atrial fibrillation rate control with digoxin  and metoprolol  consider adding Cardizem  but has depressed left ventricular function Eliquis  for anticoagulation Will recommend rate control strategy Heart failure therapy Entresto  metoprolol  Lasix  digoxin  spironolactone  Dementia continue Aricept  reassurance patient has a sitter in the room to help with behavioral ranges also on Haldol  Once patient has reasonable rate control can be discharged home follow-up  with cardiology COPD type symptoms continue inhalers as necessary    Cara JONETTA Lovelace, MD 03/24/2024 3:54 PM

## 2024-03-25 DIAGNOSIS — I5023 Acute on chronic systolic (congestive) heart failure: Secondary | ICD-10-CM | POA: Diagnosis not present

## 2024-03-25 DIAGNOSIS — F0280 Dementia in other diseases classified elsewhere without behavioral disturbance: Secondary | ICD-10-CM | POA: Diagnosis not present

## 2024-03-25 DIAGNOSIS — G309 Alzheimer's disease, unspecified: Secondary | ICD-10-CM | POA: Diagnosis not present

## 2024-03-25 DIAGNOSIS — I480 Paroxysmal atrial fibrillation: Secondary | ICD-10-CM | POA: Diagnosis not present

## 2024-03-25 LAB — MAGNESIUM: Magnesium: 2 mg/dL (ref 1.7–2.4)

## 2024-03-25 LAB — BASIC METABOLIC PANEL WITH GFR
Anion gap: 8 (ref 5–15)
BUN: 28 mg/dL — ABNORMAL HIGH (ref 8–23)
CO2: 32 mmol/L (ref 22–32)
Calcium: 8.8 mg/dL — ABNORMAL LOW (ref 8.9–10.3)
Chloride: 96 mmol/L — ABNORMAL LOW (ref 98–111)
Creatinine, Ser: 1.29 mg/dL — ABNORMAL HIGH (ref 0.61–1.24)
GFR, Estimated: 55 mL/min — ABNORMAL LOW (ref >60)
Glucose, Bld: 121 mg/dL — ABNORMAL HIGH (ref 70–99)
Potassium: 3.5 mmol/L (ref 3.5–5.1)
Sodium: 136 mmol/L (ref 135–145)

## 2024-03-25 MED ORDER — SPIRONOLACTONE 25 MG PO TABS: 25.0000 mg | ORAL_TABLET | Freq: Every day | ORAL | 0 refills | Status: AC

## 2024-03-25 MED ORDER — TORSEMIDE 20 MG PO TABS: 20.0000 mg | ORAL_TABLET | Freq: Every day | ORAL | 0 refills | Status: AC

## 2024-03-25 MED ORDER — POTASSIUM CHLORIDE CRYS ER 20 MEQ PO TBCR
40.0000 meq | EXTENDED_RELEASE_TABLET | Freq: Once | ORAL | Status: AC
  Administered 2024-03-25: 40 meq via ORAL
  Filled 2024-03-25: qty 2

## 2024-03-25 MED ORDER — METOPROLOL SUCCINATE ER 100 MG PO TB24: 100.0000 mg | ORAL_TABLET | Freq: Two times a day (BID) | ORAL | 0 refills | Status: AC

## 2024-03-25 NOTE — Plan of Care (Signed)
  Problem: Education: Goal: Ability to demonstrate management of disease process will improve Outcome: Progressing   Problem: Education: Goal: Ability to verbalize understanding of medication therapies will improve Outcome: Progressing   Problem: Activity: Goal: Capacity to carry out activities will improve Outcome: Progressing   Problem: Cardiac: Goal: Ability to achieve and maintain adequate cardiopulmonary perfusion will improve Outcome: Progressing   

## 2024-03-25 NOTE — Discharge Summary (Signed)
 Physician Discharge Summary   Patient: Jim Dodson MRN: [id number] DOB: [birth year]  Admit date:     03/20/2024  Discharge date: 03/25/24  Discharge Physician: Murvin Mana   PCP: Onita Rush, MD   Recommendations at discharge:   Follow-up with your PCP in 1 week. Follow-up with cardiology as scheduled. Check a BMP at next office visit.  Discharge Diagnoses: Principal Problem:   CHF (congestive heart failure) (HCC) Active Problems:   Paroxysmal atrial fibrillation with RVR (HCC)   Chronic coronary artery disease   Essential hypertension   GERD (gastroesophageal reflux disease)   Dementia with behavioral disturbance (HCC)   Alzheimer's disease (HCC)   Acute on chronic systolic CHF (congestive heart failure) (HCC)  Resolved Problems:   * No resolved hospital problems. [facility] Course: 83yo with h/o CAD, Alzheimer's dementia, afib on Eliquis , and chronic HFrEF who presented on 8/18 with SOB. He is not on home Lasix  (takes hydrochlorothiazide ) and was found to be in acute heart failure. Started on Lasix .  Echocardiogram showed ejection fraction of 30 to 35%, diastolic function is indeterminate.  Patient condition finally improved, renal function slightly worsened today, medically stable for discharge.  Assessment and Plan: Acute exacerbation of systolic congestive heart failure Worsening short of breath, has been treated with IV Lasix .  Patient has not been diuresing very well, I will add Aldactone  in addition to IV Lasix  on 8/21. Patient also on Entresto , beta-blocker.   Looks like there is no urine output recorded by RN, but patient short of breath appeared to be better than yesterday.  Discussed with RN, need accurate recording of urine output.  Continued Lasix  and Aldactone .   Patient no longer has any shortness of breath today, BNP still elevated, but much improved.  Renal function was initially normalized, after continued diuretic, creatinine go up slightly. Patient  has been diuresed sufficiently, volume status better.  Medically stable for discharge.  Will continue metoprolol , Entresto , spironolactone .  Patient be followed by cardiology in the near future.     Paroxysmal atrial fibrillation with RVR. Eliquis , patient has increased heart rate today up to 134.  Added as needed IV metoprolol , also give a dose of digoxin  at 0.25 mg on 8/20. Heart rate much better controlled.  Continue metoprolol  at 100 mL twice a day.   Nonsustained ventricular tachycardia Patient had 18 beats of VT, asymptomatic, cardiology aware.  This is secondary to exacerbation of congestive heart failure, no additional treatment available.   Hypokalemia. Acute kidney injury secondary to diuretics. Renal function has worsened again after diuretics.  Hold off Lasix  for 2 days before restart oral torsemide  and Aldactone  from Monday. Potassium 3.5 today, give a dose of oral potassium and 40 mEq.   HTN Continue Entresto , metoprolol    GERD Continue Protonix    Alzheimer's disease Delirium precautions Continue donepezil , memantine    BPH Continue trospium  (fesoterodine  per formulary)   Deconditioning Family has decided to take patient home.  PT/OT did not recommend home health   Overweight with BMI 29.25. Diet and excise.          Consultants: Cardiology Procedures performed: None  Disposition: Home Diet recommendation:  Discharge Diet Orders (From admission, onward)     Start     Ordered   03/25/24 0000  Diet - low sodium heart healthy        03/25/24 0941           Cardiac diet DISCHARGE MEDICATION: Allergies as of 03/25/2024  Reactions   Statins Swelling, Other (See Comments)   Legs swelled up and cramped up really bad        Medication List     TAKE these medications    Acetaminophen  500 MG capsule Take 500 mg by mouth every 6 (six) hours as needed for pain.   ALLERGY RELIEF PO Take 1 Dose by mouth daily. Gets [employer] brand    apixaban  5 MG Tabs tablet Commonly known as: ELIQUIS  Take 5 mg by mouth 2 (two) times daily.   donepezil  10 MG tablet Commonly known as: ARICEPT  TAKE 1 TABLET BY MOUTH NIGHTLY What changed: when to take this   Entresto  24-26 MG Generic drug: sacubitril -valsartan  Take 1 tablet by mouth 2 (two) times daily.   ferrous sulfate  325 (65 FE) MG tablet Take 325 mg by mouth in the morning and at bedtime.   memantine  10 MG tablet Commonly known as: NAMENDA  Take 1 tablet (10 mg total) by mouth 2 (two) times daily.   metoprolol  succinate 100 MG 24 hr tablet Commonly known as: TOPROL -XL Take 1 tablet (100 mg total) by mouth 2 (two) times daily. Take with or immediately following a meal.   REFRESH OPTIVE PF OP Place 1 drop into both eyes 2 (two) times daily as needed (dry eyes).   spironolactone  25 MG tablet Commonly known as: Aldactone  Take 1 tablet (25 mg total) by mouth daily. Start taking on: March 27, 2024   torsemide  20 MG tablet Commonly known as: DEMADEX  Take 1 tablet (20 mg total) by mouth daily. Start taking on: March 27, 2024   trospium  20 MG tablet Commonly known as: SANCTURA  TAKE ONE (1) TABLET BY MOUTH TWO TIMES PER DAY   Vitamin D3 125 MCG (5000 UT) Tabs Take 5,000 Units by mouth daily.        Follow-up Information     [facility]. Go in 1 week(s).   Why: Appointment scheduled for 8/26 at 11 AM with Dr. Antonieta Pass information: [address] [place], [place] [address]        Onita Rush, MD Follow up in 1 week(s).   Specialty: Internal Medicine Contact information: [address] [place] [address] [phone]                Discharge Exam: Fredricka Weights   03/23/24 0500 03/24/24 0500 03/25/24 0500  Weight: 86.8 kg 84.7 kg 83.4 kg   General exam: Appears calm and comfortable  Respiratory system: Clear to auscultation. Respiratory effort normal. Cardiovascular system: Irregular. No  JVD, murmurs, rubs, gallops or clicks. No pedal edema. Gastrointestinal system: Abdomen is nondistended, soft and nontender. No organomegaly or masses felt. Normal bowel sounds heard. Central nervous system: Alert and oriented x2. No focal neurological deficits. Extremities: Symmetric 5 x 5 power. Skin: No rashes, lesions or ulcers Psychiatry: Mood & affect appropriate.    Condition at discharge: good  The results of significant diagnostics from this hospitalization (including imaging, microbiology, ancillary and laboratory) are listed below for reference.   Imaging Studies: ECHOCARDIOGRAM COMPLETE Result Date: 03/21/2024    ECHOCARDIOGRAM REPORT   Patient Name:   GILDO CRISCO Date of Exam: 03/21/2024 Medical Rec #:  [id number]       Height:       67.0 in Accession #:    [id number]      Weight:       170.0 lb Date of Birth:  1941/05/26       BSA:  1.887 m Patient Age:    83 years        BP:           135/87 mmHg Patient Gender: M               HR:           66 bpm. Exam Location:  [facility] Procedure: 2D Echo, Cardiac Doppler, Color Doppler and Intracardiac            Opacification Agent (Both Spectral and Color Flow Doppler were            utilized during procedure). Indications:     CHF-Acute Systolic I50.21  History:         Patient has prior history of Echocardiogram examinations, most                  recent 03/13/2015.  Sonographer:     Ashley McNeely-Sloane Referring Phys:  [id number] [facility] PAUDEL Diagnosing Phys: Lonni Hanson MD  Sonographer Comments: Image acquisition challenging due to patient behavioral factors. and Image acquisition challenging due to uncooperative patient. IMPRESSIONS  1. Left ventricular ejection fraction, by estimation, is 30 to 35%. The left ventricle has moderate to severely decreased function. The left ventricle demonstrates global hypokinesis. The left ventricular internal cavity size was mildly dilated. There is mild asymmetric left ventricular hypertrophy of  the septal segment. Left ventricular diastolic parameters are indeterminate.  2. Right ventricular systolic function is mildly reduced. The right ventricular size is mildly enlarged.  3. Left atrial size was mildly dilated.  4. The mitral valve is grossly normal. Mild to moderate mitral valve regurgitation. No evidence of mitral stenosis.  5. The aortic valve is tricuspid. There is mild thickening of the aortic valve. Aortic valve regurgitation is mild. Aortic valve sclerosis is present, with no evidence of aortic valve stenosis.  6. There is mild dilatation of the ascending aorta, measuring 41 mm. FINDINGS  Left Ventricle: Left ventricular ejection fraction, by estimation, is 30 to 35%. The left ventricle has moderate to severely decreased function. The left ventricle demonstrates global hypokinesis. Definity  contrast agent was given IV to delineate the left ventricular endocardial borders. The left ventricular internal cavity size was mildly dilated. There is mild asymmetric left ventricular hypertrophy of the septal segment. Left ventricular diastolic parameters are indeterminate. Right Ventricle: The right ventricular size is mildly enlarged. No increase in right ventricular wall thickness. Right ventricular systolic function is mildly reduced. Left Atrium: Left atrial size was mildly dilated. Right Atrium: Right atrial size was normal in size. Pericardium: There is no evidence of pericardial effusion. Mitral Valve: The mitral valve is grossly normal. Mild to moderate mitral valve regurgitation. No evidence of mitral valve stenosis. MV peak gradient, 4.9 mmHg. The mean mitral valve gradient is 2.0 mmHg. Tricuspid Valve: The tricuspid valve is normal in structure. Tricuspid valve regurgitation is trivial. Aortic Valve: The aortic valve is tricuspid. There is mild thickening of the aortic valve. Aortic valve regurgitation is mild. Aortic valve sclerosis is present, with no evidence of aortic valve stenosis. Aortic  valve mean gradient measures 5.0 mmHg. Aortic valve peak gradient measures 7.5 mmHg. Aortic valve area, by VTI measures 2.23 cm. Pulmonic Valve: The pulmonic valve was not well visualized. Pulmonic valve regurgitation is trivial. No evidence of pulmonic stenosis. Aorta: The aortic root is normal in size and structure. There is mild dilatation of the ascending aorta, measuring 41 mm. Pulmonary Artery: The pulmonary artery is of normal size. Venous:  The inferior vena cava was not well visualized. IAS/Shunts: The interatrial septum was not well visualized.  LEFT VENTRICLE PLAX 2D LVIDd:         5.90 cm      Diastology LVIDs:         5.00 cm      LV e' medial:    6.74 cm/s LV PW:         0.90 cm      LV E/e' medial:  16.0 LV IVS:        1.20 cm      LV e' lateral:   9.25 cm/s LVOT diam:     2.53 cm      LV E/e' lateral: 11.7 LV SV:         52 LV SV Index:   28 LVOT Area:     5.04 cm  LV Volumes (MOD) LV vol d, MOD A2C: 154.0 ml LV vol d, MOD A4C: 166.0 ml LV vol s, MOD A2C: 86.6 ml LV vol s, MOD A4C: 124.0 ml LV SV MOD A2C:     67.4 ml LV SV MOD A4C:     166.0 ml LV SV MOD BP:      57.1 ml RIGHT VENTRICLE RV Basal diam:  4.30 cm RV Mid diam:    2.50 cm RV S prime:     5.33 cm/s TAPSE (M-mode): 1.6 cm LEFT ATRIUM             Index        RIGHT ATRIUM           Index LA diam:        4.10 cm 2.17 cm/m   RA Area:     16.20 cm LA Vol (A2C):   90.8 ml 48.11 ml/m  RA Volume:   39.40 ml  20.88 ml/m LA Vol (A4C):   49.7 ml 26.33 ml/m LA Biplane Vol: 70.9 ml 37.57 ml/m  AORTIC VALVE                     PULMONIC VALVE AV Area (Vmax):    2.34 cm      PV Vmax:        0.63 m/s AV Area (Vmean):   2.10 cm      PV Vmean:       41.900 cm/s AV Area (VTI):     2.23 cm      PV VTI:         0.120 m AV Vmax:           137.00 cm/s   PV Peak grad:   1.6 mmHg AV Vmean:          108.000 cm/s  PV Mean grad:   1.0 mmHg AV VTI:            0.235 m       RVOT Peak grad: 0 mmHg AV Peak Grad:      7.5 mmHg AV Mean Grad:      5.0 mmHg LVOT  Vmax:         63.70 cm/s LVOT Vmean:        45.000 cm/s LVOT VTI:          0.104 m LVOT/AV VTI ratio: 0.44  AORTA Ao Root diam: 3.25 cm Ao Asc diam:  4.10 cm MITRAL VALVE MV Area (PHT): 4.39 cm       SHUNTS MV Area VTI:   2.49 cm  Systemic VTI:  0.10 m MV Peak grad:  4.9 mmHg       Systemic Diam: 2.53 cm MV Mean grad:  2.0 mmHg       Pulmonic VTI:  0.046 m MV Vmax:       1.11 m/s MV Vmean:      71.6 cm/s MV Decel Time: 173 msec MR Peak grad:    86.1 mmHg MR Mean grad:    52.0 mmHg MR Vmax:         464.00 cm/s MR Vmean:        337.0 cm/s MR PISA:         3.08 cm MR PISA Eff ROA: 26 mm MR PISA Radius:  0.70 cm MV E velocity: 108.00 cm/s Lonni Hanson MD Electronically signed by Lonni Hanson MD Signature Date/Time: 03/21/2024/2:31:59 PM    Final    DG Chest 2 View Result Date: 03/20/2024 CLINICAL DATA:  Shortness of breath with labored breathing. EXAM: CHEST - 2 VIEW COMPARISON:  Radiographs 11/30/2019 and 07/25/2015. FINDINGS: The heart size is at the upper limits of normal. There is aortic atherosclerosis with increased vascular congestion, fissural thickening and possible trace bilateral pleural effusions. No confluent airspace disease or pneumothorax. No acute osseous findings. There are mild degenerative changes in the spine. IMPRESSION: Increased vascular congestion and possible trace bilateral pleural effusions suggesting mild congestive heart failure. Electronically Signed   By: Elsie Perone M.D.   On: 03/20/2024 16:13   VAS US  LOWER EXTREMITY VENOUS REFLUX Result Date: 02/28/2024  Lower Venous Reflux Study Patient Name:  AMES HOBAN  Date of Exam:   02/28/2024 Medical Rec #: [id number]        Accession #:    [id number] Date of Birth: 1940/12/20        Patient Gender: M Patient Age:   79 years Exam Location:  [facility] Procedure:      VAS US  LOWER EXTREMITY VENOUS REFLUX Referring Phys: BEVERLEY JOURNEY  --------------------------------------------------------------------------------  Indications: Swelling, and Pain.  Performing Technologist: Leafy Gibes RVS  Examination Guidelines: A complete evaluation includes B-mode imaging, spectral Doppler, color Doppler, and power Doppler as needed of all accessible portions of each vessel. Bilateral testing is considered an integral part of a complete examination. Limited examinations for reoccurring indications may be performed as noted. The reflux portion of the exam is performed with the patient in reverse Trendelenburg. Significant venous reflux is defined as >500 ms in the superficial venous system, and >1 second in the deep venous system.  +--------------+---------+------+-----------+------------+--------+ LEFT          Reflux NoRefluxReflux TimeDiameter cmsComments                         Yes                                  +--------------+---------+------+-----------+------------+--------+ CFV           no                                             +--------------+---------+------+-----------+------------+--------+ FV prox       no                                             +--------------+---------+------+-----------+------------+--------+  FV mid        no                                             +--------------+---------+------+-----------+------------+--------+ FV dist       no                                             +--------------+---------+------+-----------+------------+--------+ Popliteal     no                                             +--------------+---------+------+-----------+------------+--------+ GSV at [facility]              yes    1320 ms      1.08             +--------------+---------+------+-----------+------------+--------+ GSV prox thigh          yes    1489 ms      .83              +--------------+---------+------+-----------+------------+--------+ GSV mid thigh            yes    4482 ms      .77              +--------------+---------+------+-----------+------------+--------+ GSV dist thigh          yes    4526 ms      .78              +--------------+---------+------+-----------+------------+--------+ GSV at knee             yes    3191 ms      .72              +--------------+---------+------+-----------+------------+--------+ GSV prox calf           yes    3161 ms      .51              +--------------+---------+------+-----------+------------+--------+ SSV at [facility]    no                            .29              +--------------+---------+------+-----------+------------+--------+ Prox Calf               yes    3191 ms                       +--------------+---------+------+-----------+------------+--------+  Summary: Left: - No evidence of deep vein thrombosis seen in the left lower extremity, from the common femoral through the popliteal veins. - No evidence of superficial venous thrombosis in the left lower extremity. - There is no evidence of venous reflux seen in the left lower extremity. - Venous reflux is noted in the left sapheno-femoral junction. - Venous reflux is noted in the left greater saphenous vein.  *See table(s) above for measurements and observations. Electronically signed by Cordella Shawl MD on 02/28/2024 at 5:39:22 PM.    Final     Microbiology: Results for orders placed or performed in visit on 11/30/22  CULTURE, URINE COMPREHENSIVE     Status: None   Collection Time: 11/30/22  1:12 PM   Specimen: Urine   UR  Result Value Ref Range Status   Urine Culture, Comprehensive Final report  Final   Organism ID, Bacteria Comment  Final    Comment: Mixed urogenital flora 25,000-50,000 colony forming units per mL   Microscopic Examination     Status: Abnormal   Collection Time: 11/30/22  1:12 PM   Urine  Result Value Ref Range Status   WBC, UA 0-5 0 - 5 /hpf Final   RBC, Urine 3-10 (A) 0 - 2 /hpf Final    Epithelial Cells (non renal) 0-10 0 - 10 /hpf Final   Crystals Present (A) N/A Final   Crystal Type Amorphous Sediment N/A Final   Bacteria, UA Moderate (A) None seen/Few Final    Labs: CBC: Recent Labs  Lab 03/20/24 0957 03/21/24 0350  WBC 7.0 7.8  HGB 14.3 13.7  HCT 42.5 42.0  MCV 90.4 91.3  PLT 216 168   Basic Metabolic Panel: Recent Labs  Lab 03/20/24 0957 03/21/24 0350 03/22/24 0506 03/23/24 0451 03/25/24 0503  NA 136 140 143 138 136  K 3.6 3.2* 3.3* 3.6 3.5  CL 106 104 102 101 96*  CO2 21* 27 29 28  32  GLUCOSE 123* 133* 119* 103* 121*  BUN 17 18 23 20  28*  CREATININE 0.88 1.27* 1.27* 1.01 1.29*  CALCIUM 8.8* 8.8* 8.9 8.6* 8.8*  MG  --   --   --  1.9 2.0   Liver Function Tests: Recent Labs  Lab 03/21/24 0350  AST 22  ALT 21  ALKPHOS 53  BILITOT 1.1  PROT 6.2*  ALBUMIN  3.1*   CBG: Recent Labs  Lab 03/21/24 0147 03/21/24 0800  GLUCAP 139* 126*    Discharge time spent: 35 minutes.  Signed: Murvin Mana, MD [facility] 03/25/2024

## 2024-03-27 ENCOUNTER — Other Ambulatory Visit (HOSPITAL_COMMUNITY): Payer: Self-pay

## 2024-03-28 DIAGNOSIS — I77819 Aortic ectasia, unspecified site: Secondary | ICD-10-CM | POA: Diagnosis not present

## 2024-03-28 DIAGNOSIS — I255 Ischemic cardiomyopathy: Secondary | ICD-10-CM | POA: Diagnosis not present

## 2024-03-28 DIAGNOSIS — I2510 Atherosclerotic heart disease of native coronary artery without angina pectoris: Secondary | ICD-10-CM | POA: Diagnosis not present

## 2024-03-28 DIAGNOSIS — I5020 Unspecified systolic (congestive) heart failure: Secondary | ICD-10-CM | POA: Diagnosis not present

## 2024-04-05 ENCOUNTER — Other Ambulatory Visit: Payer: Self-pay | Admitting: Neurology

## 2024-04-06 DIAGNOSIS — I5020 Unspecified systolic (congestive) heart failure: Secondary | ICD-10-CM | POA: Diagnosis not present

## 2024-04-07 ENCOUNTER — Encounter (INDEPENDENT_AMBULATORY_CARE_PROVIDER_SITE_OTHER): Admitting: Nurse Practitioner

## 2024-04-10 ENCOUNTER — Ambulatory Visit (INDEPENDENT_AMBULATORY_CARE_PROVIDER_SITE_OTHER): Admitting: Vascular Surgery

## 2024-04-10 ENCOUNTER — Encounter (INDEPENDENT_AMBULATORY_CARE_PROVIDER_SITE_OTHER): Payer: Self-pay | Admitting: Vascular Surgery

## 2024-04-10 VITALS — BP 98/70 | HR 44 | Ht 67.0 in | Wt 188.8 lb

## 2024-04-10 DIAGNOSIS — I480 Paroxysmal atrial fibrillation: Secondary | ICD-10-CM | POA: Diagnosis not present

## 2024-04-10 DIAGNOSIS — I83812 Varicose veins of left lower extremities with pain: Secondary | ICD-10-CM

## 2024-04-10 DIAGNOSIS — I10 Essential (primary) hypertension: Secondary | ICD-10-CM | POA: Diagnosis not present

## 2024-04-18 ENCOUNTER — Encounter (INDEPENDENT_AMBULATORY_CARE_PROVIDER_SITE_OTHER): Payer: Self-pay | Admitting: Vascular Surgery

## 2024-04-18 NOTE — Progress Notes (Signed)
 Subjective:    Patient ID: Jim Dodson, male    DOB: [birth year], 83 y.o.   MRN: [id number] Chief Complaint  Patient presents with   New Patient (Initial Visit)     Reflux US  on 7.28.25** worsening engorged LLE varicosities with tenderness over upper anterior engorgement + significant venous stasis lower leg 2+ pedal pulse. On Eliquis . drinkard, sue.     Jim Dodson is an 83 year old male who presents to clinic today with chief complaint of left lower leg with painful varicose veins.  Patient states he noted that over the last couple of months his varicose veins started to enlarge and became uncomfortable.  They progressed in some areas that are painful depending upon his activity level.  Currently denies any pain to his lower extremities at rest or with ambulation other than at the varicose vein sites.  He endorses he has not done any conventional therapy up to this time.  He did undergo and complete an ultrasound of his left lower extremity which shows reflux in his greater saphenous vein from his thigh through his calf.    Review of Systems  Cardiovascular:  Positive for leg swelling.  Skin:  Positive for color change.       Left lower extremity with +1 edema and varicose veins that are painful.  All other systems reviewed and are negative.      Objective:   Physical Exam Vitals reviewed.  Constitutional:      Appearance: Normal appearance. He is obese.  HENT:     Head: Normocephalic.  Eyes:     Pupils: Pupils are equal, round, and reactive to light.  Cardiovascular:     Rate and Rhythm: Normal rate. Rhythm irregular.     Pulses: Normal pulses.     Heart sounds: Normal heart sounds.  Pulmonary:     Effort: Pulmonary effort is normal.     Breath sounds: Normal breath sounds.  Abdominal:     General: Bowel sounds are normal.     Palpations: Abdomen is soft.  Musculoskeletal:        General: Swelling and tenderness present.     Left lower leg: Edema present.      Comments: Positive edematous varicose veins that are painful to his left lower extremity  Skin:    General: Skin is warm and dry.     Capillary Refill: Capillary refill takes 2 to 3 seconds.  Neurological:     General: No focal deficit present.     Mental Status: He is alert and oriented to person, place, and time. Mental status is at baseline.  Psychiatric:        Mood and Affect: Mood normal.        Behavior: Behavior normal.        Thought Content: Thought content normal.        Judgment: Judgment normal.     BP 98/70   Pulse (!) 44   Ht 5' 7 (1.702 m)   Wt 188 lb 12.8 oz (85.6 kg)   BMI 29.57 kg/m   Past Medical History:  Diagnosis Date   Actinic keratosis    Aortic atherosclerosis (HCC)    Arthritis    Coronary artery disease    COVID-19    DDD (degenerative disc disease), lumbar    with associated multilevel impingement   Diastolic heart failure (HCC)    Diverticulosis    GERD (gastroesophageal reflux disease)    Headache    History of hiatal hernia  History of kidney stones    HLD (hyperlipidemia)    Hypertension    IDA (iron deficiency anemia)    Lumbar spondylolysis    Myelolipoma of right adrenal gland    Paroxysmal atrial fibrillation (HCC)    Prostate cancer (HCC)    Seborrheic dermatitis    Shortness of breath dyspnea    Subendocardial myocardial infarction [facility]) 07/2009   PCI with DES x 1 placed to LAD    Social History   Socioeconomic History   Marital status: Married    Spouse name: Probation officer   Number of children: Not on file   Years of education: Not on file   Highest education level: Not on file  Occupational History   Not on file  Tobacco Use   Smoking status: Former    Current packs/day: 0.00    Types: Cigarettes    Start date: 08/03/1968    Quit date: 08/03/1988    Years since quitting: 35.7    Passive exposure: Past   Smokeless tobacco: Never  Vaping Use   Vaping status: Never Used  Substance and Sexual Activity   Alcohol  use:  No   Drug use: No   Sexual activity: Yes  Other Topics Concern   Not on file  Social History Narrative   Lives with wife   Left Handed   Drinks no caffeine   Social Drivers of Health   Financial Resource Strain: Not on file  Food Insecurity: Not on file  Transportation Needs: Not on file  Physical Activity: Not on file  Stress: Not on file  Social Connections: Not on file  Intimate Partner Violence: Not on file    Past Surgical History:  Procedure Laterality Date   CARDIAC CATHETERIZATION     2009, stent placed   CARDIOVERSION N/A 03/13/2015   Procedure: CARDIOVERSION;  Surgeon: Oneil JAYSON Parchment, MD;  Location: [facility] ENDOSCOPY;  Service: Cardiovascular;  Laterality: N/A;   COLONOSCOPY WITH PROPOFOL  N/A 07/19/2015   Procedure: COLONOSCOPY WITH PROPOFOL ;  Surgeon: Lamar ONEIDA Holmes, MD;  Location: [facility] ENDOSCOPY;  Service: Endoscopy;  Laterality: N/A;   COLONOSCOPY WITH PROPOFOL  N/A 01/27/2019   Procedure: COLONOSCOPY WITH PROPOFOL ;  Surgeon: Holmes Lamar ONEIDA, MD;  Location: [facility] ENDOSCOPY;  Service: Endoscopy;  Laterality: N/A;   cryo surgery of prostate     CYSTOSCOPY WITH BIOPSY N/A 11/19/2020   Procedure: CYSTOSCOPY WITH POSSIBLE BLADDER BIOPSY;  Surgeon: Twylla Glendia JAYSON, MD;  Location: [facility] ORS;  Service: Urology;  Laterality: N/A;   ESOPHAGOGASTRODUODENOSCOPY N/A 01/27/2019   Procedure: ESOPHAGOGASTRODUODENOSCOPY (EGD);  Surgeon: Holmes Lamar ONEIDA, MD;  Location: [facility] ENDOSCOPY;  Service: Endoscopy;  Laterality: N/A;   ESOPHAGOGASTRODUODENOSCOPY (EGD) WITH PROPOFOL  N/A 07/19/2015   Procedure: ESOPHAGOGASTRODUODENOSCOPY (EGD) WITH PROPOFOL ;  Surgeon: Lamar ONEIDA Holmes, MD;  Location: [facility] ENDOSCOPY;  Service: Endoscopy;  Laterality: N/A;   JOINT REPLACEMENT     both knees   LAPAROTOMY N/A 03/08/2015   Procedure: EXPLORATORY LAPAROTOMY;  Surgeon: Deward Null III, MD;  Location: [facility] OR;  Service: General;  Laterality: N/A;   LAPAROTOMY N/A 03/09/2015   Procedure: EXPLORATORY LAPAROTOMY AND  EVACUATION OF HEMATOMA;  Surgeon: Deward Null III, MD;  Location: [facility] OR;  Service: General;  Laterality: N/A;   LYSIS OF ADHESION N/A 03/08/2015   Procedure: LYSIS OF ADHESION;  Surgeon: Deward Null III, MD;  Location: [facility] OR;  Service: General;  Laterality: N/A;   OMENTECTOMY N/A 03/08/2015   Procedure: OMENTECTOMY;  Surgeon: Deward Null III, MD;  Location[facility] OR;  Service: General;  Laterality: N/A;   TEE WITHOUT CARDIOVERSION N/A 03/13/2015   Procedure: TRANSESOPHAGEAL ECHOCARDIOGRAM (TEE);  Surgeon: Oneil JAYSON Parchment, MD;  Location: [facility] ENDOSCOPY;  Service: Cardiovascular;  Laterality: N/A;   TOTAL KNEE ARTHROPLASTY Left 04/08/2016   Procedure: LEFT TOTAL KNEE ARTHROPLASTY;  Surgeon: Kay CHRISTELLA Cummins, MD;  Location[facility] OR;  Service: Orthopedics;  Laterality: Left;   TOTAL KNEE ARTHROPLASTY Right 12/04/2019   Procedure: RIGHT TOTAL KNEE ARTHROPLASTY;  Surgeon: Cummins Kay CHRISTELLA, MD;  [facility] OR;  Service: Orthopedics;  Laterality: Right;   UMBILICAL HERNIA REPAIR  03/08/2015   Procedure: UMBILICAL HERNIA REPAIR  ADULT;  Surgeon: Deward Null III, MD;  [facility] OR;  Service: General;;   VENTRAL HERNIA REPAIR N/A 03/08/2015   Procedure:  VENTRAL HERNIA  REPAIR ADULT;  Surgeon: Deward Null III, MD;  Location[facility] OR;  Service: General;  Laterality: N/A;    History reviewed. No pertinent family history.  Allergies  Allergen Reactions   Statins Swelling and Other (See Comments)    Legs swelled up and cramped up really bad       Latest Ref Rng & Units 03/21/2024    3:50 AM 03/20/2024    9:57 AM 12/05/2019    6:35 AM  CBC  WBC 4.0 - 10.5 K/uL 7.8  7.0  8.9   Hemoglobin 13.0 - 17.0 g/dL 86.2  85.6  89.3   Hematocrit 39.0 - 52.0 % 42.0  42.5  32.7   Platelets 150 - 400 K/uL 168  216  131       CMP     Component Value Date/Time   NA 136 03/25/2024 0503   K 3.5 03/25/2024 0503   CL 96 (L) 03/25/2024 0503   CO2 32 03/25/2024 0503   GLUCOSE 121 (H) 03/25/2024 0503   BUN 28 (H) 03/25/2024 0503   CREATININE 1.29  (H) 03/25/2024 0503   CALCIUM 8.8 (L) 03/25/2024 0503   PROT 6.2 (L) 03/21/2024 0350   ALBUMIN  3.1 (L) 03/21/2024 0350   AST 22 03/21/2024 0350   ALT 21 03/21/2024 0350   ALKPHOS 53 03/21/2024 0350   BILITOT 1.1 03/21/2024 0350   GFRNONAA 55 (L) 03/25/2024 0503     No results found.     Assessment & Plan:   1. Varicose veins of left lower extremity with pain (Primary)  Recommend:  The patient has large symptomatic varicose veins that are painful and associated with swelling. The patient is CEAP [id number]   I have had a long discussion with the patient regarding  varicose veins and why they cause symptoms.  Patient will begin wearing graduated compression stockings class 1 on a daily basis, beginning first thing in the morning and removing them in the evening. The patient is instructed specifically not to sleep in the stockings.    The patient  will also begin using over-the-counter analgesics such as Motrin  600 mg po TID to help control the symptoms.    In addition, behavioral modification including elevation during the day will be initiated.    Pending the results of these changes the  patient will be reevaluated in three months.   An ultrasound of the venous system was obtained and patient noted to have reflux in his left GSF from thigh through calf.   Further plans will be based on the ultrasound results and whether conservative therapies can be successful at eliminating the pain and swelling.  Patient will follow up in 3 months to evaluate conservative therapy.  2. Paroxysmal atrial fibrillation with RVR (HCC) Continue antiarrhythmia medications as already ordered, these medications have been reviewed and there are no changes at this time.  Continue anticoagulation as ordered by Cardiology Service  3. Essential hypertension Continue antihypertensive medications as already ordered, these medications have been reviewed and there are no changes at this time.   Current  Outpatient Medications on File Prior to Visit  Medication Sig Dispense Refill   Acetaminophen  500 MG capsule Take 500 mg by mouth every 6 (six) hours as needed for pain.     apixaban  (ELIQUIS ) 5 MG TABS tablet Take 5 mg by mouth 2 (two) times daily.     Carboxymethylcellul-Glycerin (REFRESH OPTIVE PF OP) Place 1 drop into both eyes 2 (two) times daily as needed (dry eyes).     Chlorpheniramine Maleate (ALLERGY RELIEF PO) Take 1 Dose by mouth daily. Gets [employer] brand     Cholecalciferol  (VITAMIN D3) 5000 units TABS Take 5,000 Units by mouth daily.      donepezil  (ARICEPT ) 10 MG tablet TAKE 1 TABLET BY MOUTH NIGHTLY 30 tablet 11   ENTRESTO  24-26 MG Take 1 tablet by mouth 2 (two) times daily.     memantine  (NAMENDA ) 10 MG tablet Take 1 tablet (10 mg total) by mouth 2 (two) times daily. 60 tablet 11   metoprolol  succinate (TOPROL -XL) 100 MG 24 hr tablet Take 1 tablet (100 mg total) by mouth 2 (two) times daily. Take with or immediately following a meal. 60 tablet 0   spironolactone  (ALDACTONE ) 25 MG tablet Take 1 tablet (25 mg total) by mouth daily. 30 tablet 0   torsemide  (DEMADEX ) 20 MG tablet Take 1 tablet (20 mg total) by mouth daily. 30 tablet 0   trospium  (SANCTURA ) 20 MG tablet TAKE ONE (1) TABLET BY MOUTH TWO TIMES PER DAY 120 tablet 5   ferrous sulfate  325 (65 FE) MG tablet Take 325 mg by mouth in the morning and at bedtime.  (Patient not taking: Reported on 04/10/2024)     No current facility-administered medications on file prior to visit.    There are no Patient Instructions on file for this visit. No follow-ups on file.   Gwendlyn JONELLE Shank, NP

## 2024-05-30 ENCOUNTER — Ambulatory Visit: Admitting: Urology

## 2024-06-01 ENCOUNTER — Ambulatory Visit: Payer: Self-pay | Admitting: Urology

## 2024-06-27 ENCOUNTER — Encounter: Payer: Self-pay | Admitting: Urology

## 2024-06-27 ENCOUNTER — Ambulatory Visit: Admitting: Urology

## 2024-06-27 VITALS — BP 103/72 | HR 64 | Wt 189.0 lb

## 2024-06-27 DIAGNOSIS — N3281 Overactive bladder: Secondary | ICD-10-CM

## 2024-06-27 DIAGNOSIS — N3941 Urge incontinence: Secondary | ICD-10-CM | POA: Diagnosis not present

## 2024-06-27 LAB — BLADDER SCAN AMB NON-IMAGING: Scan Result: 21

## 2024-06-27 NOTE — Progress Notes (Signed)
 06/27/2024 5:06 PM   [place] ONEIDA Finder 01-18-1941 [id number]  Referring provider: Onita Rush, MD [address] [place],  [place] [address]  Chief Complaint  Patient presents with   Follow-up   Urologic history: Prostate Cancer History of intermediate risk prostate cancer status post cryoablation by Dr. Ike 2008 and salvage radiation Salvage radiation completed December 2019 with worsening voiding symptoms posttreatment   2. Urge incontinence Post radiation with storage elated voiding symptoms including frequency, urgency, urge incontinence and nocturia x3 Refractory to tamsulosin , beta-3 agonist and anticholinergic meds   3. Bladder erythema Underwent bladder biopsy 11/2020 for area of erythema which was inflammatory and no evidence of cancer/CIS  HPI: Jim Dodson is a 83 y.o. male who presents for annual follow-up.  He was accompanied today by his daughter who contributed to the history  Stable LUTS since last visit Remains on trospium  20 mg twice daily He saw Sam Vaillancourt 12/02/2023 for urinary incontinence however based on previous medications and reluctance to try second line options there was nothing new to offer him Denies dysuria, gross hematuria No flank, abdominal or pelvic pain   PMH: Past Medical History:  Diagnosis Date   Actinic keratosis    Aortic atherosclerosis    Arthritis    Coronary artery disease    COVID-19    DDD (degenerative disc disease), lumbar    with associated multilevel impingement   Diastolic heart failure (HCC)    Diverticulosis    GERD (gastroesophageal reflux disease)    Headache    History of hiatal hernia    History of kidney stones    HLD (hyperlipidemia)    Hypertension    IDA (iron deficiency anemia)    Lumbar spondylolysis    Myelolipoma of right adrenal gland    Paroxysmal atrial fibrillation (HCC)    Prostate cancer (HCC)    Seborrheic dermatitis    Shortness of breath dyspnea    Subendocardial myocardial  infarction [facility]) 07/2009   PCI with DES x 1 placed to LAD    Surgical History: Past Surgical History:  Procedure Laterality Date   CARDIAC CATHETERIZATION     2009, stent placed   CARDIOVERSION N/A 03/13/2015   Procedure: CARDIOVERSION;  Surgeon: Oneil JAYSON Parchment, MD;  Location: [facility] ENDOSCOPY;  Service: Cardiovascular;  Laterality: N/A;   COLONOSCOPY WITH PROPOFOL  N/A 07/19/2015   Procedure: COLONOSCOPY WITH PROPOFOL ;  Surgeon: Lamar ONEIDA Holmes, MD;  Location: [facility] ENDOSCOPY;  Service: Endoscopy;  Laterality: N/A;   COLONOSCOPY WITH PROPOFOL  N/A 01/27/2019   Procedure: COLONOSCOPY WITH PROPOFOL ;  Surgeon: Holmes Lamar ONEIDA, MD;  Location: [facility] ENDOSCOPY;  Service: Endoscopy;  Laterality: N/A;   cryo surgery of prostate     CYSTOSCOPY WITH BIOPSY N/A 11/19/2020   Procedure: CYSTOSCOPY WITH POSSIBLE BLADDER BIOPSY;  Surgeon: Twylla Glendia JAYSON, MD;  Location: [facility] ORS;  Service: Urology;  Laterality: N/A;   ESOPHAGOGASTRODUODENOSCOPY N/A 01/27/2019   Procedure: ESOPHAGOGASTRODUODENOSCOPY (EGD);  Surgeon: Holmes Lamar ONEIDA, MD;  Location: [facility] ENDOSCOPY;  Service: Endoscopy;  Laterality: N/A;   ESOPHAGOGASTRODUODENOSCOPY (EGD) WITH PROPOFOL  N/A 07/19/2015   Procedure: ESOPHAGOGASTRODUODENOSCOPY (EGD) WITH PROPOFOL ;  Surgeon: Lamar ONEIDA Holmes, MD;  Location: [facility] ENDOSCOPY;  Service: Endoscopy;  Laterality: N/A;   JOINT REPLACEMENT     both knees   LAPAROTOMY N/A 03/08/2015   Procedure: EXPLORATORY LAPAROTOMY;  Surgeon: Deward Null III, MD;  [facility] OR;  Service: General;  Laterality: N/A;   LAPAROTOMY N/A 03/09/2015   Procedure: EXPLORATORY LAPAROTOMY AND EVACUATION OF HEMATOMA;  Surgeon: Deward Null III, MD;  Location: [facility] OR;  Service: General;  Laterality: N/A;   LYSIS OF ADHESION N/A 03/08/2015   Procedure: LYSIS OF ADHESION;  Surgeon: Deward Null III, MD;  Location: [facility] OR;  Service: General;  Laterality: N/A;   OMENTECTOMY N/A 03/08/2015   Procedure: OMENTECTOMY;  Surgeon: Deward Null III, MD;  [facility] OR;  Service: General;  Laterality: N/A;   TEE WITHOUT CARDIOVERSION N/A 03/13/2015   Procedure: TRANSESOPHAGEAL ECHOCARDIOGRAM (TEE);  Surgeon: Oneil JAYSON Parchment, MD;  Location: [facility] ENDOSCOPY;  Service: Cardiovascular;  Laterality: N/A;   TOTAL KNEE ARTHROPLASTY Left 04/08/2016   Procedure: LEFT TOTAL KNEE ARTHROPLASTY;  Surgeon: Kay CHRISTELLA Cummins, MD;  [facility] OR;  Service: Orthopedics;  Laterality: Left;   TOTAL KNEE ARTHROPLASTY Right 12/04/2019   Procedure: RIGHT TOTAL KNEE ARTHROPLASTY;  Surgeon: Cummins Kay CHRISTELLA, MD;  Location[facility] OR;  Service: Orthopedics;  Laterality: Right;   UMBILICAL HERNIA REPAIR  03/08/2015   Procedure: UMBILICAL HERNIA REPAIR  ADULT;  Surgeon: Deward Null III, MD;  [facility] OR;  Service: General;;   VENTRAL HERNIA REPAIR N/A 03/08/2015   Procedure:  VENTRAL HERNIA  REPAIR ADULT;  Surgeon: Deward Null III, MD;  Location: [facility] OR;  Service: General;  Laterality: N/A;    Home Medications:  Allergies as of 06/27/2024       Reactions   Statins Swelling, Other (See Comments)   Legs swelled up and cramped up really bad        Medication List        Accurate as of June 27, 2024  5:06 PM. If you have any questions, ask your nurse or doctor.          Acetaminophen  500 MG capsule Take 500 mg by mouth every 6 (six) hours as needed for pain.   ALLERGY RELIEF PO Take 1 Dose by mouth daily. Gets [employer] brand   apixaban  5 MG Tabs tablet Commonly known as: ELIQUIS  Take 5 mg by mouth 2 (two) times daily.   donepezil  10 MG tablet Commonly known as: ARICEPT  TAKE 1 TABLET BY MOUTH NIGHTLY   Entresto  24-26 MG Generic drug: sacubitril -valsartan  Take 1 tablet by mouth 2 (two) times daily.   ferrous sulfate  325 (65 FE) MG tablet Take 325 mg by mouth in the morning and at bedtime.   memantine  10 MG tablet Commonly known as: NAMENDA  Take 1 tablet (10 mg total) by mouth 2 (two) times daily.   metoprolol  succinate 100 MG 24 hr tablet Commonly known as:  TOPROL -XL Take 1 tablet (100 mg total) by mouth 2 (two) times daily. Take with or immediately following a meal.   REFRESH OPTIVE PF OP Place 1 drop into both eyes 2 (two) times daily as needed (dry eyes).   spironolactone  25 MG tablet Commonly known as: Aldactone  Take 1 tablet (25 mg total) by mouth daily.   torsemide  20 MG tablet Commonly known as: DEMADEX  Take 1 tablet (20 mg total) by mouth daily.   trospium  20 MG tablet Commonly known as: SANCTURA  TAKE ONE (1) TABLET BY MOUTH TWO TIMES PER DAY   Vitamin D3 125 MCG (5000 UT) Tabs Take 5,000 Units by mouth daily.        Allergies:  Allergies  Allergen Reactions   Statins Swelling and Other (See Comments)    Legs swelled up and cramped up really bad    Family History: No family history on file.  Social History:  reports that he quit  smoking about 35 years ago. His smoking use included cigarettes. He started smoking about 55 years ago. He has been exposed to tobacco smoke. He has never used smokeless tobacco. He reports that he does not drink alcohol  and does not use drugs.   Physical Exam: BP 103/72   Pulse 64   Wt 189 lb (85.7 kg)   BMI 29.60 kg/m   Constitutional:  Alert, No acute distress. HEENT: [place] AT Respiratory: Normal respiratory effort, no increased work of breathing. Psychiatric: Normal mood and affect.   Assessment & Plan:    1. Overactive bladder with urge incontinence PVR today 21 mL I again discussed PTNS and they were provided literature.  He may be interested in pursuing and will call back if he desires to schedule Continue annual follow-up   Glendia JAYSON Barba, MD  [facility] [address] [place], [place] [address] [phone]

## 2024-07-11 ENCOUNTER — Ambulatory Visit (INDEPENDENT_AMBULATORY_CARE_PROVIDER_SITE_OTHER): Admitting: Vascular Surgery

## 2024-07-25 ENCOUNTER — Telehealth (INDEPENDENT_AMBULATORY_CARE_PROVIDER_SITE_OTHER): Payer: Self-pay

## 2024-07-25 ENCOUNTER — Ambulatory Visit (INDEPENDENT_AMBULATORY_CARE_PROVIDER_SITE_OTHER): Admitting: Vascular Surgery

## 2024-07-25 NOTE — Telephone Encounter (Signed)
 Daughter called back wanting to reschedule the appointment for patient, she also reports they need a reset pin sent to them for mychart as they do not have access. Please advise

## 2024-08-08 ENCOUNTER — Encounter (INDEPENDENT_AMBULATORY_CARE_PROVIDER_SITE_OTHER): Payer: Self-pay | Admitting: Vascular Surgery

## 2024-08-08 ENCOUNTER — Ambulatory Visit (INDEPENDENT_AMBULATORY_CARE_PROVIDER_SITE_OTHER): Payer: Self-pay | Admitting: Vascular Surgery

## 2024-08-08 VITALS — Ht 67.0 in

## 2024-08-08 DIAGNOSIS — I480 Paroxysmal atrial fibrillation: Secondary | ICD-10-CM

## 2024-08-08 DIAGNOSIS — I10 Essential (primary) hypertension: Secondary | ICD-10-CM

## 2024-08-08 DIAGNOSIS — I83812 Varicose veins of left lower extremities with pain: Secondary | ICD-10-CM

## 2024-08-08 NOTE — Progress Notes (Signed)
 "  Subjective:    Patient ID: Jim Dodson, male    DOB: 11/18/1940, 84 y.o.   MRN: [id number] Chief Complaint  Patient presents with   Follow-up    3 months no studies    Jim Dodson is an 84 year old male who presents to clinic for 71-month follow-up from left lower extremity varicose veins and swelling.  He is accompanied by his wife and his daughter today.  He has been wearing his compression socks and elevating his legs as requested.  And therefore has considerable decrease in both swelling to his legs and his varicose veins.  He also had followed up with his PCP as recommended for his elevated hypertension.  He was started on valsartan  which has helped decrease his hypertension considerably which you can see has affected his lower extremities as well.  He is doing very well today.  He endorses that he is walking much better and his legs are feeling better with much less pain.  I recommended to the patient and his family that they continue to do what they are doing as he is having great success.  I also made sure that he understood and he is supposed to be wearing his compression socks all day but do not sleep in them.  I also recommended that they change to new compression socks every 3 months to have the same effect he is getting from his current pair.      Review of Systems  Constitutional: Negative.   Musculoskeletal:  Positive for myalgias.  All other systems reviewed and are negative.    Objective:   Physical Exam Vitals reviewed.  Constitutional:      Appearance: Normal appearance. He is obese.  HENT:     Head: Normocephalic and atraumatic.  Eyes:     Pupils: Pupils are equal, round, and reactive to light.  Cardiovascular:     Rate and Rhythm: Normal rate and regular rhythm.     Pulses: Normal pulses.     Heart sounds: Normal heart sounds.  Pulmonary:     Effort: Pulmonary effort is normal.     Breath sounds: Normal breath sounds.  Abdominal:     General: Bowel  sounds are normal.     Palpations: Abdomen is soft.  Musculoskeletal:        General: Normal range of motion.     Comments: Patient continues to have varicose veins but are much less painful and much less swollen today.  He has no swelling to his bilateral lower extremities as he did prior.  Skin:    General: Skin is warm and dry.     Capillary Refill: Capillary refill takes 2 to 3 seconds.  Neurological:     General: No focal deficit present.     Mental Status: He is alert and oriented to person, place, and time. Mental status is at baseline.  Psychiatric:        Mood and Affect: Mood normal.        Behavior: Behavior normal.        Thought Content: Thought content normal.        Judgment: Judgment normal.     Physical Exam          Ht 5' 7 (1.702 m)   BMI 29.60 kg/m   Past Medical History:  Diagnosis Date   Actinic keratosis    Aortic atherosclerosis    Arthritis    Coronary artery disease    COVID-19    DDD (  degenerative disc disease), lumbar    with associated multilevel impingement   Diastolic heart failure (HCC)    Diverticulosis    GERD (gastroesophageal reflux disease)    Headache    History of hiatal hernia    History of kidney stones    HLD (hyperlipidemia)    Hypertension    IDA (iron deficiency anemia)    Lumbar spondylolysis    Myelolipoma of right adrenal gland    Paroxysmal atrial fibrillation (HCC)    Prostate cancer (HCC)    Seborrheic dermatitis    Shortness of breath dyspnea    Subendocardial myocardial infarction [facility]) 07/2009   PCI with DES x 1 placed to LAD    Social History   Socioeconomic History   Marital status: Married    Spouse name: Probation Officer   Number of children: Not on file   Years of education: Not on file   Highest education level: Not on file  Occupational History   Not on file  Tobacco Use   Smoking status: Former    Current packs/day: 0.00    Types: Cigarettes    Start date: 08/03/1968    Quit date: 08/03/1988     Years since quitting: 36.0    Passive exposure: Past   Smokeless tobacco: Never  Vaping Use   Vaping status: Never Used  Substance and Sexual Activity   Alcohol  use: No   Drug use: No   Sexual activity: Yes  Other Topics Concern   Not on file  Social History Narrative   Lives with wife   Left Handed   Drinks no caffeine   Social Drivers of Health   Tobacco Use: Medium Risk (08/08/2024)   Patient History    Smoking Tobacco Use: Former    Smokeless Tobacco Use: Never    Passive Exposure: Past  Physicist, Medical Strain: Not on [employer] Insecurity: Not on file  Transportation Needs: Not on file  Physical Activity: Not on file  Stress: Not on file  Social Connections: Not on file  Intimate Partner Violence: Not on file  Depression (PHQ2-9): Not on file  Alcohol  Screen: Not on file  Housing: Unknown (12/14/2023)   Received from [facility]   Epic    Unable to Pay for Housing in the Last Year: Not on file    Number of Times Moved in the Last Year: Not on file    At any time in the past 12 months, were you homeless or living in a shelter (including now)?: No  Utilities: Not on file  Health Literacy: Not on file    Past Surgical History:  Procedure Laterality Date   CARDIAC CATHETERIZATION     2009, stent placed   CARDIOVERSION N/A 03/13/2015   Procedure: CARDIOVERSION;  Surgeon: Oneil JAYSON Parchment, MD;  Location: [facility] ENDOSCOPY;  Service: Cardiovascular;  Laterality: N/A;   COLONOSCOPY WITH PROPOFOL  N/A 07/19/2015   Procedure: COLONOSCOPY WITH PROPOFOL ;  Surgeon: Lamar ONEIDA Holmes, MD;  Location: [facility] ENDOSCOPY;  Service: Endoscopy;  Laterality: N/A;   COLONOSCOPY WITH PROPOFOL  N/A 01/27/2019   Procedure: COLONOSCOPY WITH PROPOFOL ;  Surgeon: Holmes Lamar ONEIDA, MD;  Location: [facility] ENDOSCOPY;  Service: Endoscopy;  Laterality: N/A;   cryo surgery of prostate     CYSTOSCOPY WITH BIOPSY N/A 11/19/2020   Procedure: CYSTOSCOPY WITH POSSIBLE BLADDER BIOPSY;  Surgeon:  Twylla Glendia JAYSON, MD;  Location: [facility] ORS;  Service: Urology;  Laterality: N/A;   ESOPHAGOGASTRODUODENOSCOPY N/A 01/27/2019   Procedure: ESOPHAGOGASTRODUODENOSCOPY (EGD);  Surgeon: Viktoria Lamar DASEN, MD;  Location: [facility] ENDOSCOPY;  Service: Endoscopy;  Laterality: N/A;   ESOPHAGOGASTRODUODENOSCOPY (EGD) WITH PROPOFOL  N/A 07/19/2015   Procedure: ESOPHAGOGASTRODUODENOSCOPY (EGD) WITH PROPOFOL ;  Surgeon: Lamar DASEN Viktoria, MD;  Location: [facility] ENDOSCOPY;  Service: Endoscopy;  Laterality: N/A;   JOINT REPLACEMENT     both knees   LAPAROTOMY N/A 03/08/2015   Procedure: EXPLORATORY LAPAROTOMY;  Surgeon: Deward Null III, MD;  Location: [facility] OR;  Service: General;  Laterality: N/A;   LAPAROTOMY N/A 03/09/2015   Procedure: EXPLORATORY LAPAROTOMY AND EVACUATION OF HEMATOMA;  Surgeon: Deward Null III, MD;  Location: [facility] OR;  Service: General;  Laterality: N/A;   LYSIS OF ADHESION N/A 03/08/2015   Procedure: LYSIS OF ADHESION;  Surgeon: Deward Null III, MD;  Location: [facility] OR;  Service: General;  Laterality: N/A;   OMENTECTOMY N/A 03/08/2015   Procedure: OMENTECTOMY;  Surgeon: Deward Null III, MD;  Location: [facility] OR;  Service: General;  Laterality: N/A;   TEE WITHOUT CARDIOVERSION N/A 03/13/2015   Procedure: TRANSESOPHAGEAL ECHOCARDIOGRAM (TEE);  Surgeon: Oneil JAYSON Parchment, MD;  Location: [facility] ENDOSCOPY;  Service: Cardiovascular;  Laterality: N/A;   TOTAL KNEE ARTHROPLASTY Left 04/08/2016   Procedure: LEFT TOTAL KNEE ARTHROPLASTY;  Surgeon: Kay CHRISTELLA Cummins, MD;  Location: [facility] OR;  Service: Orthopedics;  Laterality: Left;   TOTAL KNEE ARTHROPLASTY Right 12/04/2019   Procedure: RIGHT TOTAL KNEE ARTHROPLASTY;  Surgeon: Cummins Kay CHRISTELLA, MD;  Location: [facility] OR;  Service: Orthopedics;  Laterality: Right;   UMBILICAL HERNIA REPAIR  03/08/2015   Procedure: UMBILICAL HERNIA REPAIR  ADULT;  Surgeon: Deward Null III, MD;  Location: [facility] OR;  Service: General;;   VENTRAL HERNIA REPAIR N/A 03/08/2015   Procedure:  VENTRAL HERNIA  REPAIR ADULT;  Surgeon: Deward Null III,  MD;  Location: [facility] OR;  Service: General;  Laterality: N/A;    History reviewed. No pertinent family history.  Allergies[1]     Latest Ref Rng & Units 03/21/2024    3:50 AM 03/20/2024    9:57 AM 12/05/2019    6:35 AM  CBC  WBC 4.0 - 10.5 K/uL 7.8  7.0  8.9   Hemoglobin 13.0 - 17.0 g/dL 86.2  85.6  89.3   Hematocrit 39.0 - 52.0 % 42.0  42.5  32.7   Platelets 150 - 400 K/uL 168  216  131       CMP     Component Value Date/Time   NA 136 03/25/2024 0503   K 3.5 03/25/2024 0503   CL 96 (L) 03/25/2024 0503   CO2 32 03/25/2024 0503   GLUCOSE 121 (H) 03/25/2024 0503   BUN 28 (H) 03/25/2024 0503   CREATININE 1.29 (H) 03/25/2024 0503   CALCIUM 8.8 (L) 03/25/2024 0503   PROT 6.2 (L) 03/21/2024 0350   ALBUMIN  3.1 (L) 03/21/2024 0350   AST 22 03/21/2024 0350   ALT 21 03/21/2024 0350   ALKPHOS 53 03/21/2024 0350   BILITOT 1.1 03/21/2024 0350   GFRNONAA 55 (L) 03/25/2024 0503     No results found.     Assessment & Plan:   1. Varicose veins of left lower extremity with pain (Primary) Recommend:   The patient has large symptomatic varicose veins that are painful and associated with swelling. The patient is CEAP [id number]    I have had a long discussion with the patient regarding  varicose veins and why they cause symptoms.  Patient will begin wearing graduated compression stockings class 1 on a daily basis, beginning first  thing in the morning and removing them in the evening. The patient is instructed specifically not to sleep in the stockings.     The patient  will also begin using over-the-counter analgesics such as Motrin  600 mg po TID to help control the symptoms.     In addition, behavioral modification including elevation during the day will be initiated.     Pending the results of these changes the  patient will be reevaluated in three months.   An ultrasound of the venous system was obtained and patient noted to have reflux in his left GSF from thigh through calf.     Further plans will be based on the ultrasound results and whether conservative therapies can be successful at eliminating the pain and swelling.   Patient will follow up PRN as he no longer has any major swelling to his lower extremities and varicose veins at the increased 80 to 90%.  2. Paroxysmal atrial fibrillation with RVR (HCC) Continue antiarrhythmia medications as already ordered, these medications have been reviewed and there are no changes at this time.   Continue anticoagulation as ordered by Cardiology Service  3. Essential hypertension Continue antihypertensive medications as already ordered, these medications have been reviewed and there are no changes at this time.  Patient continues to have better blood pressure control today than he did on his previous visit.  He was added valsartan  to his medical regime which has helped the swelling to his lower extremities considerably.    Medications Ordered Prior to Encounter[2]  There are no Patient Instructions on file for this visit. No follow-ups on file.   Gwendlyn JONELLE Shank, NP      [1]  Allergies Allergen Reactions   Statins Swelling and Other (See Comments)    Legs swelled up and cramped up really bad  [2]  Current Outpatient Medications on File Prior to Visit  Medication Sig Dispense Refill   Acetaminophen  500 MG capsule Take 500 mg by mouth every 6 (six) hours as needed for pain.     apixaban  (ELIQUIS ) 5 MG TABS tablet Take 5 mg by mouth 2 (two) times daily.     Carboxymethylcellul-Glycerin (REFRESH OPTIVE PF OP) Place 1 drop into both eyes 2 (two) times daily as needed (dry eyes).     Chlorpheniramine Maleate (ALLERGY RELIEF PO) Take 1 Dose by mouth daily. Gets [employer] brand     Cholecalciferol  (VITAMIN D3) 5000 units TABS Take 5,000 Units by mouth daily.      donepezil  (ARICEPT ) 10 MG tablet TAKE 1 TABLET BY MOUTH NIGHTLY 30 tablet 11   ENTRESTO  24-26 MG Take 1 tablet by mouth 2 (two) times daily.      memantine  (NAMENDA ) 10 MG tablet Take 1 tablet (10 mg total) by mouth 2 (two) times daily. 60 tablet 11   metoprolol  succinate (TOPROL -XL) 100 MG 24 hr tablet Take 1 tablet (100 mg total) by mouth 2 (two) times daily. Take with or immediately following a meal. 60 tablet 0   Red Yeast Rice 600 MG CAPS Take 600 mg by mouth 1 day or 1 dose.     spironolactone  (ALDACTONE ) 25 MG tablet Take 1 tablet (25 mg total) by mouth daily. 30 tablet 0   torsemide  (DEMADEX ) 20 MG tablet Take 1 tablet (20 mg total) by mouth daily. 30 tablet 0   trospium  (SANCTURA ) 20 MG tablet TAKE ONE (1) TABLET BY MOUTH TWO TIMES PER DAY 120 tablet 5   ferrous sulfate  325 (65 FE) MG  tablet Take 325 mg by mouth in the morning and at bedtime.  (Patient not taking: Reported on 04/10/2024)     No current facility-administered medications on file prior to visit.   "

## 2025-06-27 ENCOUNTER — Ambulatory Visit: Admitting: Urology
